# Patient Record
Sex: Female | Born: 1965 | Race: White | Hispanic: No | Marital: Married | Smoking: Former smoker
Health system: Southern US, Community
[De-identification: ages and names within clinical notes are randomized; demographics above are authoritative.]

## PROBLEM LIST (undated history)

## (undated) DIAGNOSIS — E785 Hyperlipidemia, unspecified: Secondary | ICD-10-CM

## (undated) DIAGNOSIS — G43909 Migraine, unspecified, not intractable, without status migrainosus: Secondary | ICD-10-CM

## (undated) DIAGNOSIS — I639 Cerebral infarction, unspecified: Secondary | ICD-10-CM

## (undated) HISTORY — DX: Hyperlipidemia, unspecified: E78.5

---

## 2019-10-02 ENCOUNTER — Emergency Department (HOSPITAL_COMMUNITY): Payer: Medicaid Other

## 2019-10-02 ENCOUNTER — Emergency Department (HOSPITAL_COMMUNITY): Payer: Medicaid Other | Admitting: Registered Nurse

## 2019-10-02 ENCOUNTER — Inpatient Hospital Stay (HOSPITAL_COMMUNITY)
Admission: EM | Admit: 2019-10-02 | Discharge: 2019-10-07 | DRG: 023 | Disposition: A | Discharge status: Home / Self Care | Payer: Medicaid Other | Attending: Neurology | Admitting: Neurology

## 2019-10-02 ENCOUNTER — Encounter (HOSPITAL_COMMUNITY)
Admission: EM | Disposition: A | Discharge status: Home / Self Care | Payer: Self-pay | Source: Home / Self Care | Attending: Neurology

## 2019-10-02 ENCOUNTER — Other Ambulatory Visit: Payer: Self-pay

## 2019-10-02 DIAGNOSIS — E785 Hyperlipidemia, unspecified: Secondary | ICD-10-CM | POA: Diagnosis present

## 2019-10-02 DIAGNOSIS — Z720 Tobacco use: Secondary | ICD-10-CM | POA: Diagnosis not present

## 2019-10-02 DIAGNOSIS — R1310 Dysphagia, unspecified: Secondary | ICD-10-CM | POA: Diagnosis present

## 2019-10-02 DIAGNOSIS — I63412 Cerebral infarction due to embolism of left middle cerebral artery: Secondary | ICD-10-CM | POA: Diagnosis not present

## 2019-10-02 DIAGNOSIS — Z9282 Status post administration of tPA (rtPA) in a different facility within the last 24 hours prior to admission to current facility: Secondary | ICD-10-CM

## 2019-10-02 DIAGNOSIS — D62 Acute posthemorrhagic anemia: Secondary | ICD-10-CM | POA: Diagnosis not present

## 2019-10-02 DIAGNOSIS — E162 Hypoglycemia, unspecified: Secondary | ICD-10-CM | POA: Diagnosis not present

## 2019-10-02 DIAGNOSIS — R4701 Aphasia: Secondary | ICD-10-CM | POA: Diagnosis present

## 2019-10-02 DIAGNOSIS — G43109 Migraine with aura, not intractable, without status migrainosus: Secondary | ICD-10-CM | POA: Diagnosis present

## 2019-10-02 DIAGNOSIS — R29716 NIHSS score 16: Secondary | ICD-10-CM | POA: Diagnosis present

## 2019-10-02 DIAGNOSIS — E876 Hypokalemia: Secondary | ICD-10-CM | POA: Diagnosis present

## 2019-10-02 DIAGNOSIS — I700 Atherosclerosis of aorta: Secondary | ICD-10-CM | POA: Diagnosis present

## 2019-10-02 DIAGNOSIS — Z20822 Contact with and (suspected) exposure to covid-19: Secondary | ICD-10-CM | POA: Diagnosis present

## 2019-10-02 DIAGNOSIS — I63512 Cerebral infarction due to unspecified occlusion or stenosis of left middle cerebral artery: Secondary | ICD-10-CM | POA: Diagnosis present

## 2019-10-02 DIAGNOSIS — J90 Pleural effusion, not elsewhere classified: Secondary | ICD-10-CM | POA: Diagnosis not present

## 2019-10-02 DIAGNOSIS — G8101 Flaccid hemiplegia affecting right dominant side: Secondary | ICD-10-CM | POA: Diagnosis present

## 2019-10-02 DIAGNOSIS — I161 Hypertensive emergency: Secondary | ICD-10-CM | POA: Diagnosis present

## 2019-10-02 DIAGNOSIS — J9601 Acute respiratory failure with hypoxia: Secondary | ICD-10-CM | POA: Diagnosis not present

## 2019-10-02 DIAGNOSIS — R451 Restlessness and agitation: Secondary | ICD-10-CM | POA: Diagnosis not present

## 2019-10-02 DIAGNOSIS — Z823 Family history of stroke: Secondary | ICD-10-CM | POA: Diagnosis not present

## 2019-10-02 DIAGNOSIS — I10 Essential (primary) hypertension: Secondary | ICD-10-CM | POA: Diagnosis present

## 2019-10-02 DIAGNOSIS — R29810 Facial weakness: Secondary | ICD-10-CM | POA: Diagnosis present

## 2019-10-02 DIAGNOSIS — I959 Hypotension, unspecified: Secondary | ICD-10-CM | POA: Diagnosis not present

## 2019-10-02 DIAGNOSIS — Z978 Presence of other specified devices: Secondary | ICD-10-CM

## 2019-10-02 DIAGNOSIS — R471 Dysarthria and anarthria: Secondary | ICD-10-CM | POA: Diagnosis present

## 2019-10-02 DIAGNOSIS — I639 Cerebral infarction, unspecified: Secondary | ICD-10-CM | POA: Diagnosis present

## 2019-10-02 DIAGNOSIS — D72829 Elevated white blood cell count, unspecified: Secondary | ICD-10-CM | POA: Diagnosis not present

## 2019-10-02 DIAGNOSIS — I69391 Dysphagia following cerebral infarction: Secondary | ICD-10-CM

## 2019-10-02 DIAGNOSIS — Z0189 Encounter for other specified special examinations: Secondary | ICD-10-CM

## 2019-10-02 HISTORY — DX: Cerebral infarction, unspecified: I63.9

## 2019-10-02 HISTORY — PX: RADIOLOGY WITH ANESTHESIA: SHX6223

## 2019-10-02 HISTORY — DX: Migraine, unspecified, not intractable, without status migrainosus: G43.909

## 2019-10-02 LAB — CBC
HCT: 45.3 % (ref 36.0–46.0)
Hemoglobin: 14.7 g/dL (ref 12.0–15.0)
MCH: 30.6 pg (ref 26.0–34.0)
MCHC: 32.5 g/dL (ref 30.0–36.0)
MCV: 94.2 fL (ref 80.0–100.0)
Platelets: 366 10*3/uL (ref 150–400)
RBC: 4.81 MIL/uL (ref 3.87–5.11)
RDW: 12.7 % (ref 11.5–15.5)
WBC: 14.1 10*3/uL — ABNORMAL HIGH (ref 4.0–10.5)
nRBC: 0 % (ref 0.0–0.2)

## 2019-10-02 LAB — COMPREHENSIVE METABOLIC PANEL
ALT: 19 U/L (ref 0–44)
AST: 17 U/L (ref 15–41)
Albumin: 4.5 g/dL (ref 3.5–5.0)
Alkaline Phosphatase: 74 U/L (ref 38–126)
Anion gap: 8 (ref 5–15)
BUN: 13 mg/dL (ref 6–20)
CO2: 25 mmol/L (ref 22–32)
Calcium: 9.2 mg/dL (ref 8.9–10.3)
Chloride: 107 mmol/L (ref 98–111)
Creatinine, Ser: 0.88 mg/dL (ref 0.44–1.00)
GFR calc Af Amer: >60 mL/min (ref >60)
GFR calc non Af Amer: >60 mL/min (ref >60)
Glucose, Bld: 113 mg/dL — ABNORMAL HIGH (ref 70–99)
Potassium: 4.3 mmol/L (ref 3.5–5.1)
Sodium: 140 mmol/L (ref 135–145)
Total Bilirubin: 0.4 mg/dL (ref 0.3–1.2)
Total Protein: 7.4 g/dL (ref 6.5–8.1)

## 2019-10-02 LAB — DIFFERENTIAL
Abs Immature Granulocytes: 0.05 10*3/uL (ref 0.00–0.07)
Basophils Absolute: 0.1 10*3/uL (ref 0.0–0.1)
Basophils Relative: 1 %
Eosinophils Absolute: 0.3 10*3/uL (ref 0.0–0.5)
Eosinophils Relative: 2 %
Immature Granulocytes: 0 %
Lymphocytes Relative: 20 %
Lymphs Abs: 2.8 10*3/uL (ref 0.7–4.0)
Monocytes Absolute: 0.6 10*3/uL (ref 0.1–1.0)
Monocytes Relative: 4 %
Neutro Abs: 10.2 10*3/uL — ABNORMAL HIGH (ref 1.7–7.7)
Neutrophils Relative %: 73 %

## 2019-10-02 LAB — PROTIME-INR
INR: 0.9 (ref 0.8–1.2)
Prothrombin Time: 12.1 seconds (ref 11.4–15.2)

## 2019-10-02 LAB — RESPIRATORY PANEL BY RT PCR (FLU A&B, COVID)
Influenza A by PCR: NEGATIVE
Influenza B by PCR: NEGATIVE
SARS Coronavirus 2 by RT PCR: NEGATIVE

## 2019-10-02 LAB — APTT: aPTT: 27 seconds (ref 24–36)

## 2019-10-02 LAB — CBG MONITORING, ED: Glucose-Capillary: 124 mg/dL — ABNORMAL HIGH (ref 70–99)

## 2019-10-02 LAB — ETHANOL: Alcohol, Ethyl (B): <10 mg/dL (ref <10)

## 2019-10-02 IMAGING — XA IR PERCUTANEOUS ART THORMBECTOMY/INFUSION INTRACRANIAL INCLUDE D
1 series · 8 of 24 positions shown · IV contrast (IODINE)
Comparison: CT angiogram of the head and neck [DATE].

INDICATION: New onset aphasia, right-sided paralysis, and left gaze deviation.
Occluded left middle cerebral artery M1 segment on CT angiogram of
the head and neck.

EXAM:
1. EMERGENT LARGE VESSEL OCCLUSION THROMBOLYSIS (anterior
CIRCULATION)
2. Intracranial rescue stent placement
TECHNIQUE: Following a full explanation of the procedure along with the
potential associated complications, an informed witnessed consent
was obtained patient's spouse. The risks of intracranial hemorrhage
of 10%, worsening neurological deficit, ventilator dependency, death
and inability to revascularize were all reviewed in detail with the
patient's spouse.

[Series 300: dr. (person_name) · 8 of 150 slices shown]
[im 7/150]
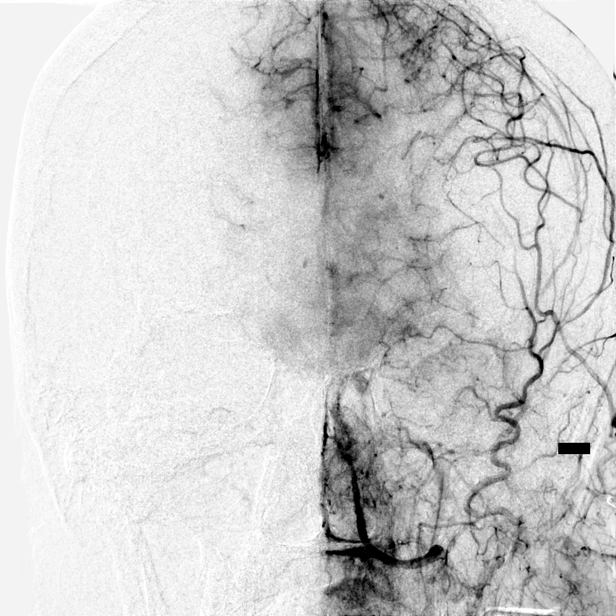
[im 26/150]
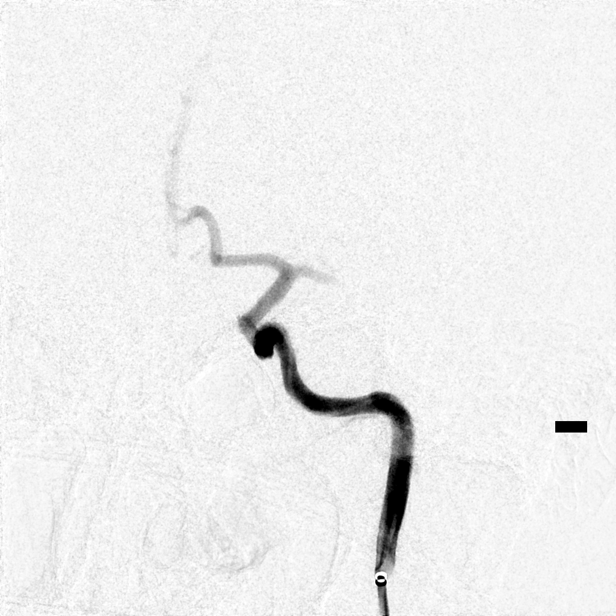
[im 46/150]
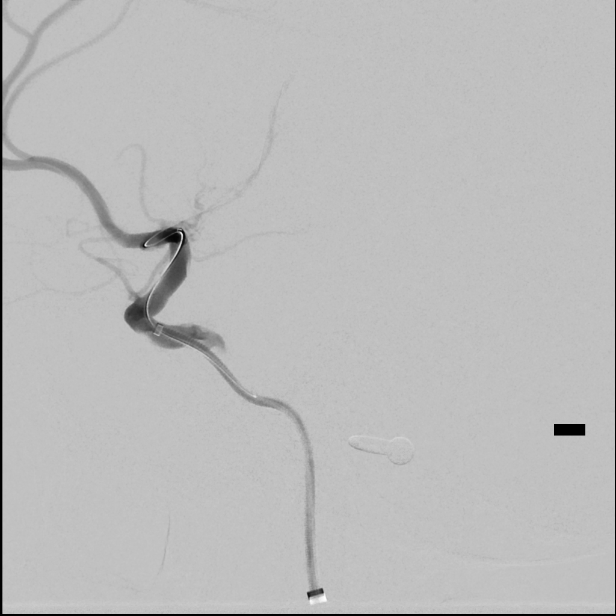
[im 65/150]
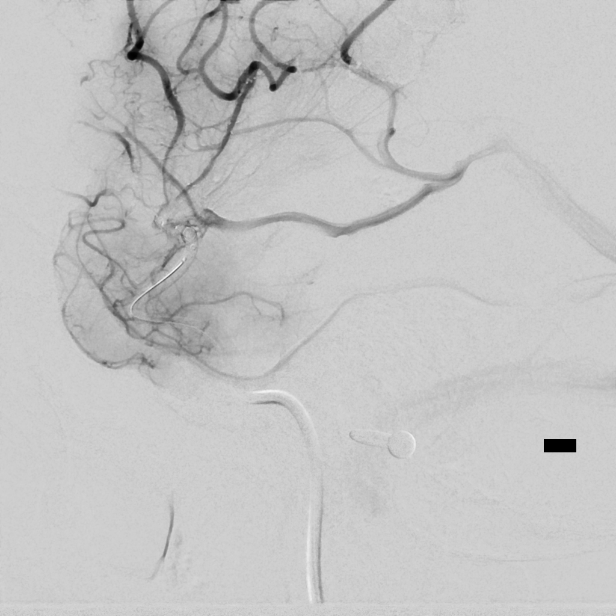
[im 85/150]
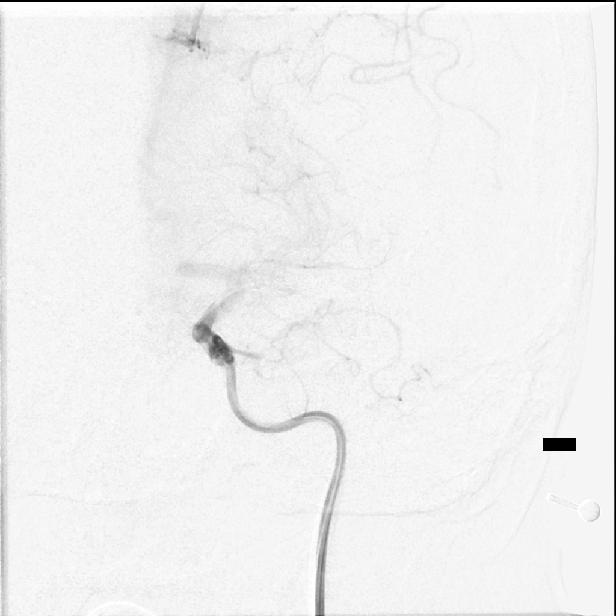
[im 104/150]
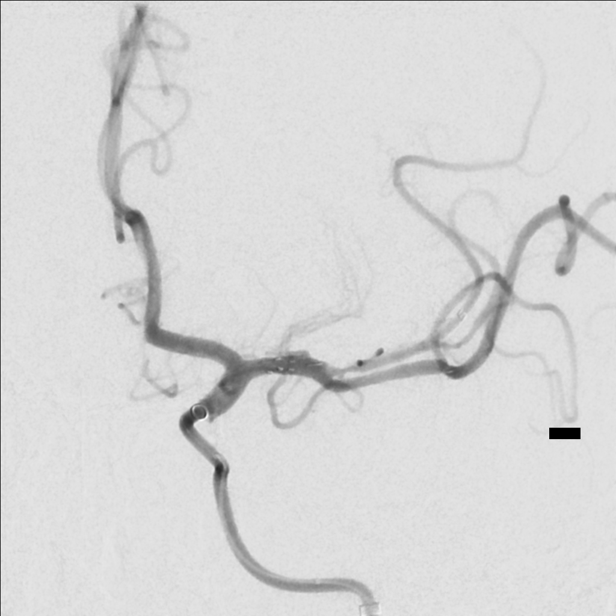
[im 124/150]
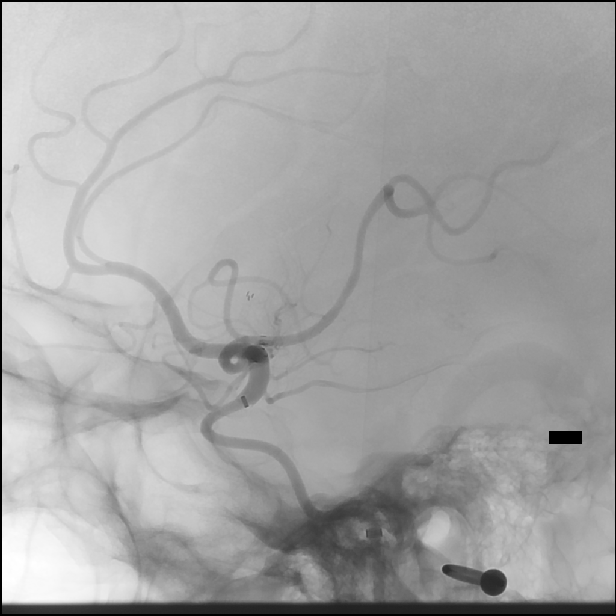
[im 143/150]
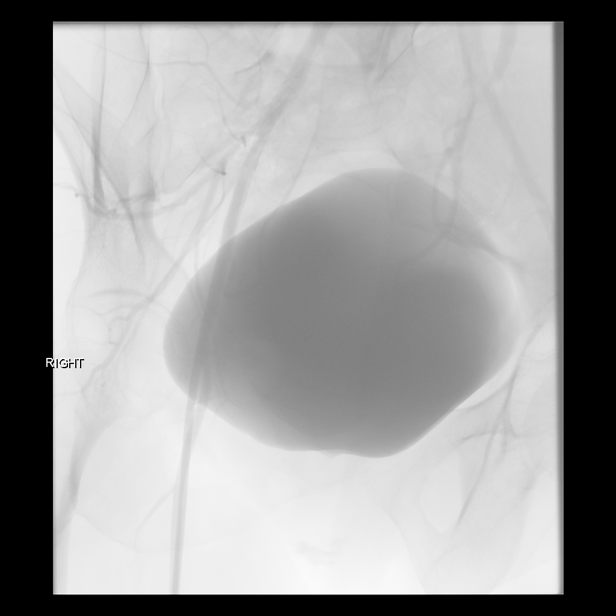

[8 of 24 positions shown; findings below may reference images not displayed]

MEDICATIONS:
Ancef 2 g IV antibiotic was administered within 1 hour of the
procedure.

ANESTHESIA/SEDATION:
General anesthesia.

CONTRAST:  Isovue 300 approximately 120 mL.

FLUOROSCOPY TIME:  Fluoroscopy Time: 76 minutes 36 seconds ([LE]
mGy).

COMPLICATIONS:
None immediate.
The patient was then put under general anesthesia by the [REDACTED] at [HOSPITAL].

The right groin was prepped and draped in the usual sterile fashion.
Thereafter using modified Seldinger technique, transfemoral access
into the right common femoral artery was obtained without
difficulty. Over a 0.035 inch guidewire an 8 French 25 cm Pinnacle
sheath was inserted. Through this, and also over a 0.035 inch
guidewire a connection of ANNIE GRACE 125 cm catheter inside of an
087 95 cm balloon guide catheter was advanced to the left common
carotid artery. The guidewire, and the ANNIE GRACE support catheter
were then removed. Good aspiration obtained from the hub of the
balloon guide catheter. A gentle control arteriogram performed
through balloon guide catheter demonstrated no evidence of spasms,
dissections or of intraluminal filling defects.
FINDINGS: However, the left common carotid arteriogram demonstrates the left
external carotid artery and its major branches to be widely patent.
The left internal carotid artery at the bulb to the cranial skull
base also demonstrates wide patency. The petrous, the cavernous and
the supraclinoid segments are intact. The left anterior cerebral
artery opacifies into the capillary and venous phases with prompt
cross-filling of the right anterior cerebral A2 segment from the
left internal carotid artery injection.

The left middle cerebral artery demonstrates complete occlusion in
its mid M1 segment.

The delayed arterial phase demonstrates partial retrograde
opacification of the anterior to middle perisylvian branches.

PROCEDURE:
Over a 0.014 inch standard Synchro micro guidewire with a J
configuration the combination of a Trevo ProVue 021 microcatheter
inside of a 6 French 132 cm Catalyst guide catheter was advanced
using biplane roadmap technique and constant fluoroscopic guidance
to the supraclinoid left ICA.

The left middle cerebral artery was then entered with the micro
guidewire and into the superior division of the left middle cerebral
artery followed by the microcatheter. The guidewire was removed.
Good aspiration obtained from the hub of the microcatheter. A gentle
control arteriogram performed through this demonstrated safe
position of the tip of the microcatheter which was then connected to
continuous heparinized saline infusion. The 6 French Catalyst guide
catheter was then advanced into the distal left middle cerebral
artery with near complete occlusion of the aspiration with a
Penumbra aspiration device for approximately 2-1/2 minutes. A 4 mm x
40 mm Solitaire X retrieval device was then advanced to the distal
end of the microcatheter and deployed with slight forward gentle
traction with the right hand on the delivery micro guidewire, and
retrieving the delivery microcatheter. Proximal flow arrest was then
established by inflating the balloon of the balloon guide catheter
in the mid left internal carotid artery.

With constant aspiration applied at the hub of the 6 French Catalyst
guide catheter, and with a 60 mL syringe at the hub of the balloon
guide catheter, the combination of the retrieval device, and the
microcatheter and the 6 French Catalyst catheter were retrieved and
removed. Flow arrest was then reversed.

A control arteriogram performed through the balloon guide catheter
in the left internal carotid artery continued to demonstrate
occluded left middle cerebral M1 segment. No evidence of clot was
seen in the aspirate or entangled in the retrieval device.

A second pass was then made again using the above combination. At
this time access with the micro guidewire and the microcatheter was
obtained in the inferior division of the left middle cerebral artery
M2 M3 region where the microcatheter was positioned. The guidewire
was removed. Good aspiration obtained from the hub of the
microcatheter. A gentle control arteriogram performed through the
microcatheter demonstrates safe position of the tip of the
microcatheter. A 6 mm x 40 mm Solitaire X retrieval device was then
advanced to the distal end of the microcatheter. The O ring on the
delivery microcatheter was loosened. With slight forward gentle
traction with the right hand on the delivery micro guidewire, with
the left hand the delivery microcatheter was retrieved deploying the
retrieval device. The 6 French Catalyst guide was now engaged again
in the occluded left middle cerebral artery distally to near
complete occlusion to aspiration with the Penumbra aspiration device
over 2-1/2 minutes. With proximal flow arrest and constant
aspiration being applied at the hub of the balloon guide catheter in
the left internal carotid artery, the combination was retrieved and
removed. Again no evidence of a clot was seen in the aspirate or in
the retrieval device. A control arteriogram performed through the
balloon guide catheter in the left internal carotid artery again
demonstrated no change in the occluded left middle cerebral artery
M1 segment.

A third pass was then made using a large-bore catheter inside of
which was the 021 Trevo microcatheter again advanced as the
combination over a 0.14 inch standard Synchro micro guidewire to the
supraclinoid left ICA. Again the micro guidewire was then gently
manipulated with a torque device and this time advanced into the
middle branch of the left MCA trifurcation between the inferior and
the superior divisions. This is then followed by the microcatheter
into the M2 M3 region. The guidewire was removed. Good aspiration
obtained from the hub of the microcatheter. A gentle control
arteriogram performed through the microcatheter demonstrated safe
position of the tip of the microcatheter which was then connected to
continuous heparinized saline infusion. A 4 mm x 40 mm Solitaire X
retrieval device was again advanced to the distal end of the
microcatheter. This was again deployed in the usual manner. A gentle
control arteriogram performed through the 6 French large-bore
catheter in the proximal left MCA now demonstrated a TICI 2c
revascularization with an overlying severe atherosclerotic stenosis
at the origin of the middle branch and extending into the origin of
the inferior division.

Again with proximal flow arrest in the left internal carotid artery,
and constant aspiration with the Penumbra aspirate device with the
large-bore catheter embedded in the occluded distal left MCA over
2-1/2 minutes the combination was retrieved and removed. Again no
evidence of clot was found in the retrieval device, or aspirate.

A control arteriogram performed following flow reversal in the left
internal carotid artery demonstrated angiographically occluded left
middle cerebral artery.

It was felt overlying pathology was related to an underlying
atherosclerotic plaque causing significant stenosis.

Following discussion with the referring neuro hospitalist, it was
decided to proceed with rescue stenting.

At this time, the patient was loaded with Brilinta 180 mg, and
aspirin 81 mg via an orogastric tube.

A combination of a Headway 17 2 tip microcatheter inside of a 014
inch standard Synchro micro guidewire inside of a large-bore support
catheter was then advanced as mentioned earlier to the supraclinoid
left ICA.

The micro guidewire was then gently manipulated with a torque device
and advanced without difficulty into the inferior division of the
left MCA trifurcation followed by the microcatheter. The guidewire
was removed. Good aspiration obtained from the hub of the Headway
microcatheter. A gentle control arteriogram performed through
microcatheter demonstrated safe position of the tip of the
microcatheter.

A 4 mm x 24 mm Atlas stent delivery system was then advanced to the
distal end of the microcatheter, the delivery microcatheter was then
positioned such that the proximal portion of the landing zone stent
would be just inside the left middle cerebral artery origin.

The O ring on the delivery microcatheter was loosened. With slight
forward gentle traction with the right hand on the delivery micro
guidewire with the left hand the delivery microcatheter was
retrieved unsheathing the distal and the proximal portion of the
stent in excellent position. The tines of the stent opened
completely proximally and distally.

A control arteriogram performed through the large bore catheter
demonstrated excellent revascularization of the inferior division
into M3 M4 branches. The middle branch of the trifurcation remained
occluded.

The micro guidewire was reintroduced into the microcatheter and
advanced without difficulty into the proximal portion of the
deployed stent. Using a torque device, access was obtained into the
middle branch without difficulty followed by the microcatheter was
advanced to the M2 M3 region. The guidewire was removed. Good
aspiration obtained from the hub of the microcatheter which was then
connected to continuous heparinized saline infusion.

Again another 4 mm x 24 mm Atlas Neuroform stent was advanced to the
distal end of the microcatheter. This was again deployed such that
the proximal portion of the stent was in the proximal portion of the
left MCA. Following deployment, a control arteriogram performed
through the large bore catheter in the left middle cerebral artery
demonstrated a TICI 2c revascularization.

A control arteriogram performed approximately 5 minutes later
demonstrated progressive slow flow in the middle branch of the
stented segment.

This prompted the use of approximately 6 mg of intra-arterial
Integrilin for 10 minutes.

A control arteriogram performed approximately 10 minutes later
demonstrated slow revascularization of this stented branch.

It was, therefore, decided to stop at this juncture.

The large-bore catheter and the balloon guide catheter were
retrieved and removed. A CT of the brain demonstrated no evidence of
a gross hemorrhage, mass effect or midline shift. There was dense
contrast staining in the basal ganglia region.

Following this it was decided to proceed with a slow IV infusion of
aggrastat and half the minimal dose run as an infusion over
approximately 8-12 hours in order to protect the stented segments of
the left middle cerebral artery.

Patient was left intubated on account of the procedure, and also
patient had received IV tPA and multiple dual antiplatelets.
Additionally, the 8 French Pinnacle sheath in the right groin was
left and connected to continuous heparinized saline infusion.

Prior to the placement of rescue stents at the superior and inferior
divisions of the left middle cerebral artery, a CT was performed of
the brain on the table. This continued to demonstrate no evidence of
a gross hemorrhage, or mass effect or midline shift. Contrast stain
was seen in the basal ganglia region.

Distal pulses remained palpable in the dorsalis pedis, and the
posterior tibial regions bilaterally.

The patient was then transferred intubated to the neuro ICU to
continue with post revascularization care.
IMPRESSION: Status post endovascular revascularization of occluded left middle
cerebral artery M1 segment, with 2 passes with the Solitaire 4 mm x
40 mm X retrieval device, 1 pass with the 6 mm x 40 mm Solitaire X
retrieval device, with Penumbra aspiration, and placement of a
rescue stents in the superior and inferior divisions of the left
middle cerebral artery achieving a TICI 2b to TICI 2c
revascularization.

PLAN:
Follow-up in the clinic 4 weeks post discharge.

## 2019-10-02 IMAGING — CT CT HEAD CODE STROKE
3 series · 16 of 47 positions shown, 19 images · non-contrast
Comparison: None.

CLINICAL DATA: Code stroke. 53-year-old female with severe headache
and right side weakness since [JV] hours.

EXAM:
CT HEAD WITHOUT CONTRAST
TECHNIQUE: Contiguous axial images were obtained from the base of the skull
through the vertex without intravenous contrast.

[Series 2: head w o · axial · 0.43mm/px · z∈[+1276,+1401]mm · 10 of 30 slices shown, 13 images]
[im 3/30  brain]
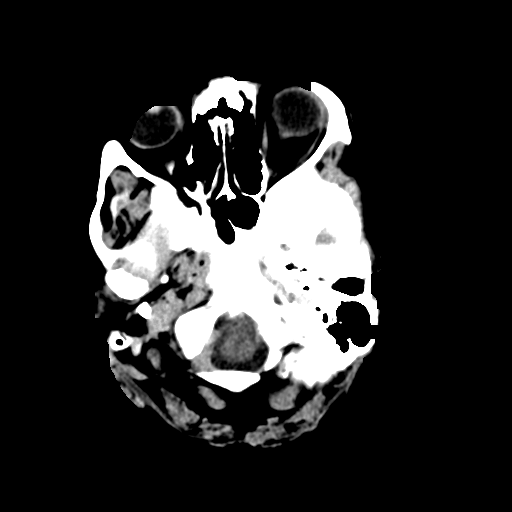
[im 3/30  bone]
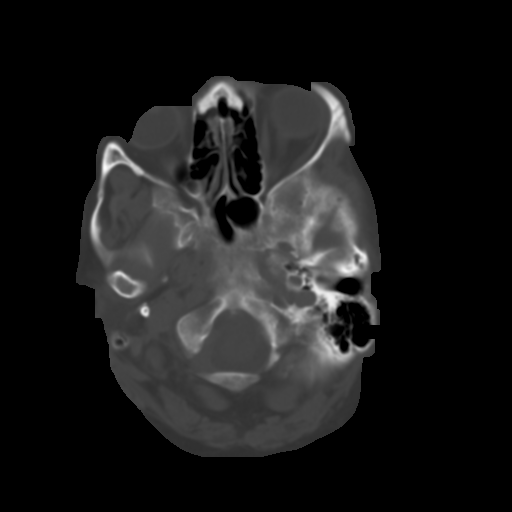
[im 6/30  brain]
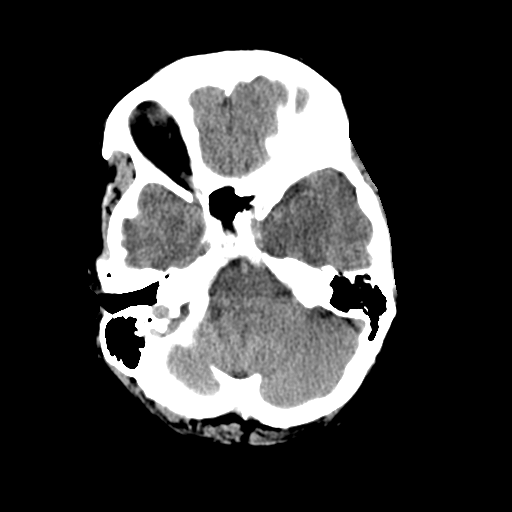
[im 9/30  brain]
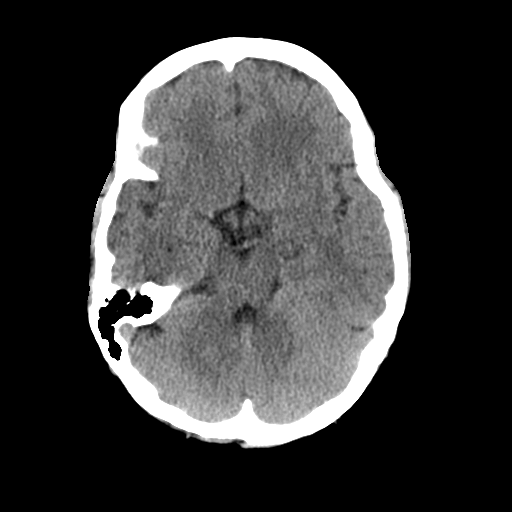
[im 11/30  brain]
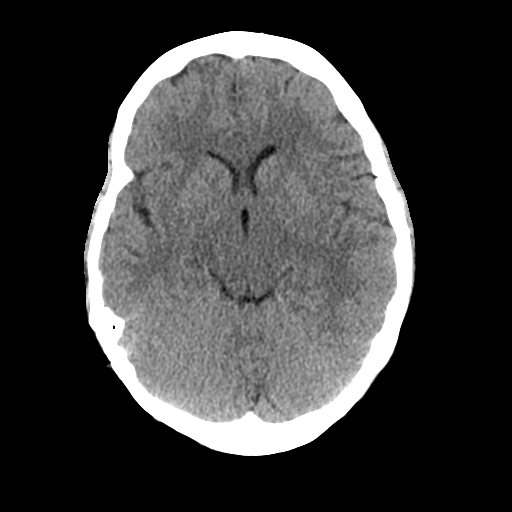
[im 14/30  brain]
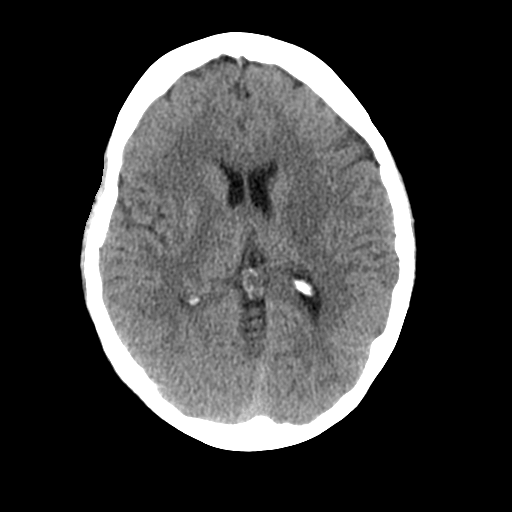
[im 14/30  bone]
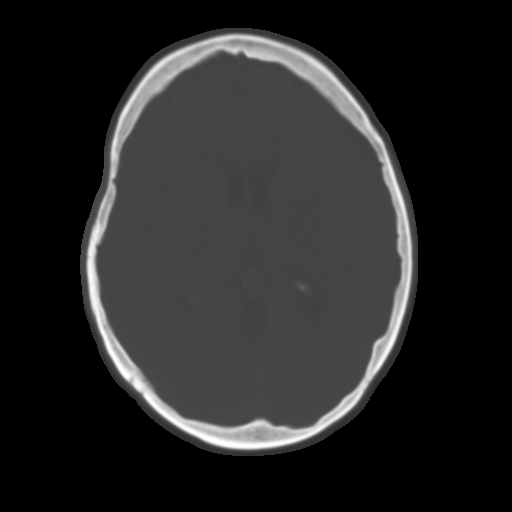
[im 17/30  brain]
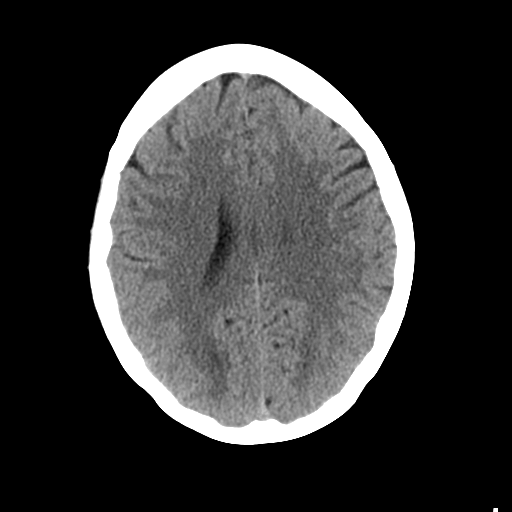
[im 20/30  brain]
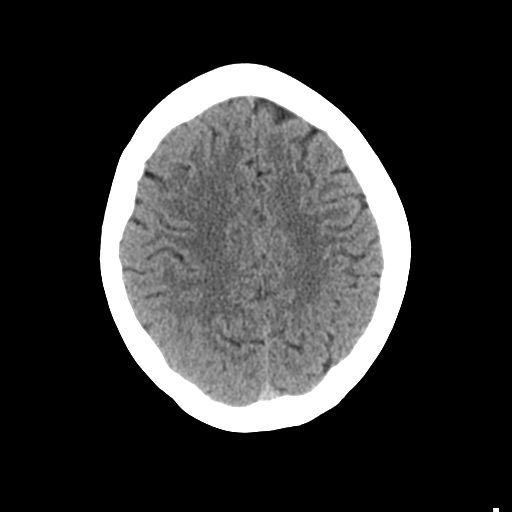
[im 23/30  brain]
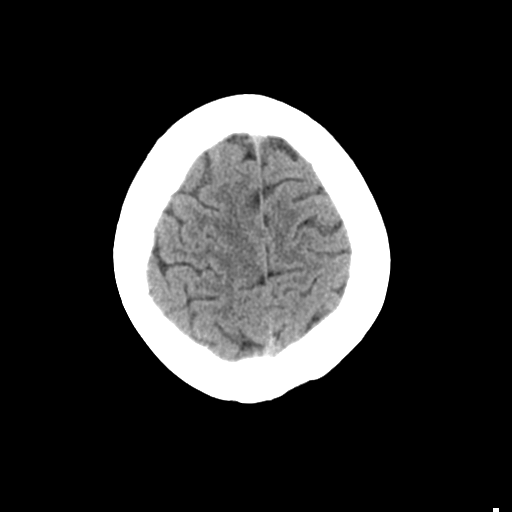
[im 25/30  brain]
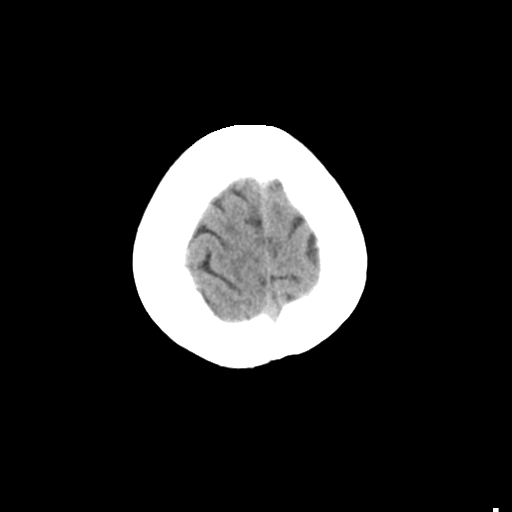
[im 25/30  bone]
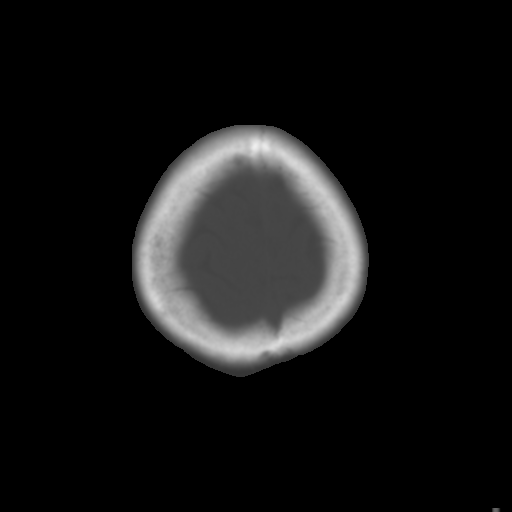
[im 28/30  brain]
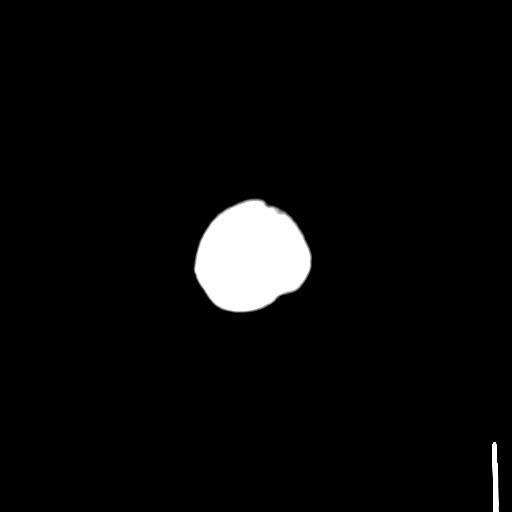

[Series 4: coronal soft · coronal · 0.31mm/px · 3 of 62 slices shown]
[im 21/62  brain]
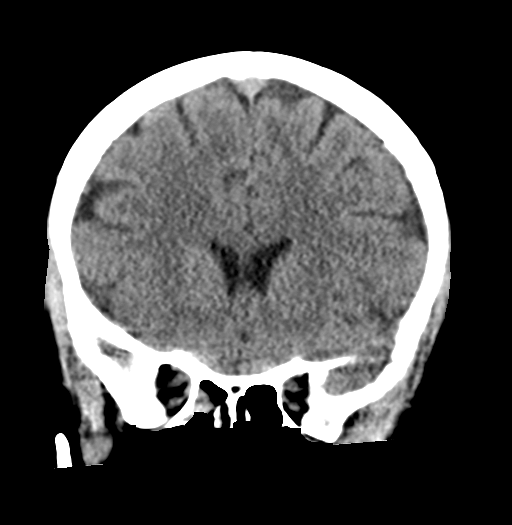
[im 28/62  brain]
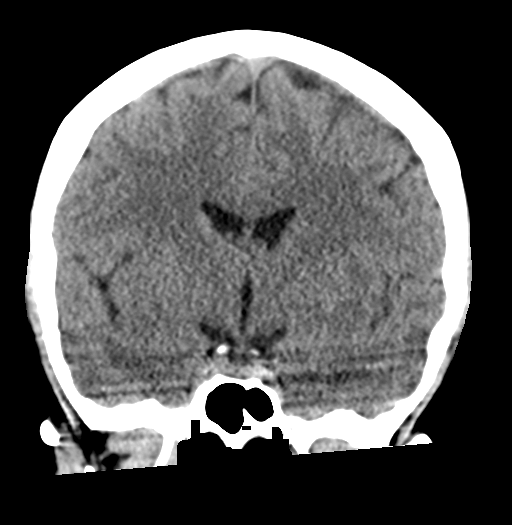
[im 34/62  brain]
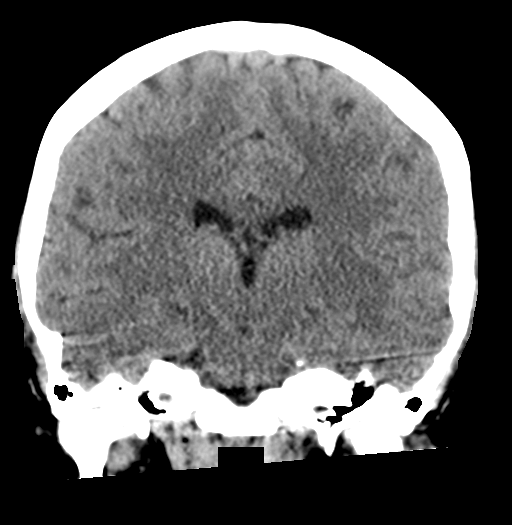

[Series 5: sagittal soft · sagittal · 0.32mm/px · 3 of 53 slices shown]
[im 18/53  brain]
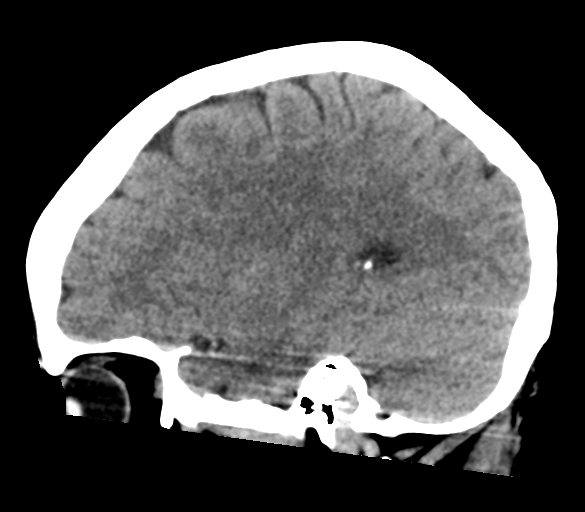
[im 27/53  brain]
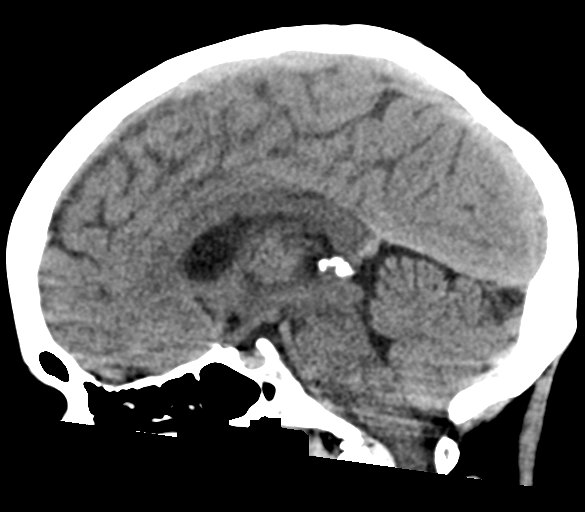
[im 35/53  brain]
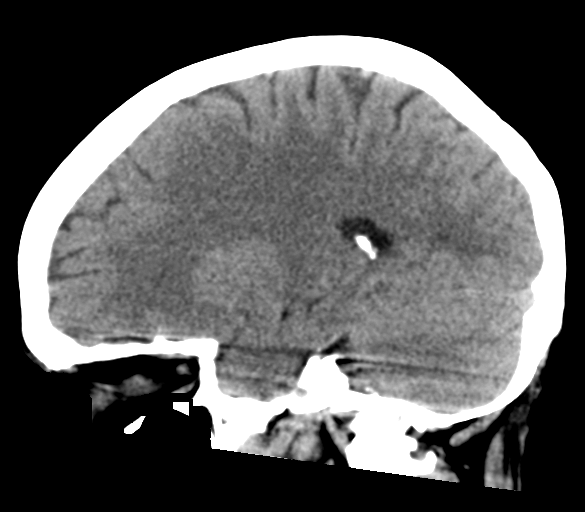

[16 of 47 positions shown; findings below may reference images not displayed]

FINDINGS: Brain: No midline shift, mass effect, or evidence of intracranial
mass lesion. No ventriculomegaly. No acute intracranial hemorrhage
identified.

No cortically based acute infarct identified. Chronic appearing
white matter hypodensity at the anterior left corona radiata on
series 2, image 16. Elsewhere gray-white matter differentiation is
within normal limits.

Vascular: No suspicious intracranial vascular hyperdensity.

Skull: Negative.

Sinuses/Orbits: Clear aside from some right posterior ethmoid air
cell mucosal thickening. Mastoids and tympanic cavities appear
clear.

Other: Visualized orbits and scalp soft tissues are within normal
limits.

ASPECTS ([place] Stroke Program Early CT Score)

Total score (0-10 with 10 being normal): 10
IMPRESSION: 1. No acute cortically based infarct or acute intracranial
hemorrhage identified. ASPECTS 10.
2. Evidence of small vessel disease in the left corona radiata, age
indeterminate although favor chronic.

Study discussed by telephone with Dr. KADRIC on [DATE] at

## 2019-10-02 SURGERY — IR WITH ANESTHESIA
Anesthesia: General

## 2019-10-02 MED ORDER — LIDOCAINE 2% (20 MG/ML) 5 ML SYRINGE
INTRAMUSCULAR | Status: DC | PRN
  Administered 2019-10-02: 60 mg via INTRAVENOUS

## 2019-10-02 MED ORDER — PHENYLEPHRINE HCL-NACL 10-0.9 MG/250ML-% IV SOLN
INTRAVENOUS | Status: DC | PRN
  Administered 2019-10-02: 15 ug/min via INTRAVENOUS

## 2019-10-02 MED ORDER — PHENYLEPHRINE 40 MCG/ML (10ML) SYRINGE FOR IV PUSH (FOR BLOOD PRESSURE SUPPORT)
PREFILLED_SYRINGE | INTRAVENOUS | Status: DC | PRN
  Administered 2019-10-02 (×6): 40 ug via INTRAVENOUS
  Administered 2019-10-03 (×2): 80 ug via INTRAVENOUS

## 2019-10-02 MED ORDER — PANTOPRAZOLE SODIUM 40 MG IV SOLR: 40.0000 mg | Freq: Every day | INTRAVENOUS | Status: DC

## 2019-10-02 MED ORDER — ACETAMINOPHEN 160 MG/5ML PO SOLN: 650.0000 mg | ORAL | Status: DC | PRN

## 2019-10-02 MED ORDER — LACTATED RINGERS IV SOLN: INTRAVENOUS | Status: DC | PRN

## 2019-10-02 MED ORDER — LABETALOL HCL 5 MG/ML IV SOLN
INTRAVENOUS | Status: AC
  Filled 2019-10-02: qty 4

## 2019-10-02 MED ORDER — ALTEPLASE 100 MG IV SOLR
INTRAVENOUS | Status: AC
  Filled 2019-10-02: qty 100

## 2019-10-02 MED ORDER — DEXAMETHASONE SODIUM PHOSPHATE 10 MG/ML IJ SOLN
INTRAMUSCULAR | Status: DC | PRN
  Administered 2019-10-02: 5 mg via INTRAVENOUS

## 2019-10-02 MED ORDER — SENNOSIDES-DOCUSATE SODIUM 8.6-50 MG PO TABS: 1.0000 | ORAL_TABLET | Freq: Every evening | ORAL | Status: DC | PRN

## 2019-10-02 MED ORDER — STROKE: EARLY STAGES OF RECOVERY BOOK
Freq: Once | Status: DC
  Filled 2019-10-02: qty 1

## 2019-10-02 MED ORDER — CEFAZOLIN SODIUM-DEXTROSE 2-4 GM/100ML-% IV SOLN
INTRAVENOUS | Status: AC
  Filled 2019-10-02: qty 100

## 2019-10-02 MED ORDER — VERAPAMIL HCL 2.5 MG/ML IV SOLN
INTRAVENOUS | Status: AC
  Filled 2019-10-02: qty 2

## 2019-10-02 MED ORDER — PROPOFOL 10 MG/ML IV BOLUS
INTRAVENOUS | Status: DC | PRN
  Administered 2019-10-02: 110 mg via INTRAVENOUS

## 2019-10-02 MED ORDER — IOHEXOL 240 MG/ML SOLN
INTRAMUSCULAR | Status: AC
  Filled 2019-10-02: qty 200

## 2019-10-02 MED ORDER — CLEVIDIPINE BUTYRATE 0.5 MG/ML IV EMUL
0.0000 mg/h | INTRAVENOUS | Status: DC
  Filled 2019-10-02 (×2): qty 50

## 2019-10-02 MED ORDER — ACETAMINOPHEN 650 MG RE SUPP: 650.0000 mg | RECTAL | Status: DC | PRN

## 2019-10-02 MED ORDER — CEFAZOLIN SODIUM-DEXTROSE 2-3 GM-%(50ML) IV SOLR
INTRAVENOUS | Status: DC | PRN
  Administered 2019-10-02: 2 g via INTRAVENOUS

## 2019-10-02 MED ORDER — CLOPIDOGREL BISULFATE 300 MG PO TABS
ORAL_TABLET | ORAL | Status: AC
  Filled 2019-10-02: qty 1

## 2019-10-02 MED ORDER — SUCCINYLCHOLINE CHLORIDE 200 MG/10ML IV SOSY
PREFILLED_SYRINGE | INTRAVENOUS | Status: DC | PRN
  Administered 2019-10-02: 110 mg via INTRAVENOUS

## 2019-10-02 MED ORDER — SODIUM CHLORIDE 0.9 % IV SOLN
50.0000 mL | Freq: Once | INTRAVENOUS | Status: AC
  Administered 2019-10-02: 50 mL via INTRAVENOUS

## 2019-10-02 MED ORDER — ASPIRIN 81 MG PO CHEW
CHEWABLE_TABLET | ORAL | Status: AC
  Administered 2019-10-02: 81 mg via OROGASTRIC
  Filled 2019-10-02: qty 1

## 2019-10-02 MED ORDER — SODIUM CHLORIDE 0.9 % IV SOLN: INTRAVENOUS | Status: DC

## 2019-10-02 MED ORDER — NITROGLYCERIN 1 MG/10 ML FOR IR/CATH LAB
INTRA_ARTERIAL | Status: AC
  Administered 2019-10-02 (×2): 25 ug via INTRA_ARTERIAL
  Filled 2019-10-02: qty 10

## 2019-10-02 MED ORDER — EPTIFIBATIDE 20 MG/10ML IV SOLN
INTRAVENOUS | Status: AC
  Administered 2019-10-03: 6 mg via INTRA_ARTERIAL
  Filled 2019-10-02: qty 10

## 2019-10-02 MED ORDER — ROCURONIUM BROMIDE 10 MG/ML (PF) SYRINGE
PREFILLED_SYRINGE | INTRAVENOUS | Status: DC | PRN
  Administered 2019-10-02: 30 mg via INTRAVENOUS
  Administered 2019-10-02: 10 mg via INTRAVENOUS
  Administered 2019-10-02 – 2019-10-03 (×2): 50 mg via INTRAVENOUS

## 2019-10-02 MED ORDER — TICAGRELOR 90 MG PO TABS
ORAL_TABLET | ORAL | Status: AC
  Administered 2019-10-02: 180 mg via OROGASTRIC
  Filled 2019-10-02: qty 2

## 2019-10-02 MED ORDER — ALTEPLASE (STROKE) FULL DOSE INFUSION
0.9000 mg/kg | Freq: Once | INTRAVENOUS | Status: AC
  Administered 2019-10-02: 54 mg via INTRAVENOUS
  Filled 2019-10-02: qty 100

## 2019-10-02 MED ORDER — ACETAMINOPHEN 325 MG PO TABS: 650.0000 mg | ORAL_TABLET | ORAL | Status: DC | PRN

## 2019-10-02 MED ORDER — IOHEXOL 350 MG/ML SOLN
150.0000 mL | Freq: Once | INTRAVENOUS | Status: AC | PRN
  Administered 2019-10-02: 115 mL via INTRAVENOUS

## 2019-10-02 MED ORDER — LABETALOL HCL 5 MG/ML IV SOLN
20.0000 mg | Freq: Once | INTRAVENOUS | Status: DC
  Filled 2019-10-02 (×2): qty 4

## 2019-10-02 MED ORDER — FENTANYL CITRATE (PF) 250 MCG/5ML IJ SOLN
INTRAMUSCULAR | Status: DC | PRN
  Administered 2019-10-02: 50 ug via INTRAVENOUS
  Administered 2019-10-02: 150 ug via INTRAVENOUS
  Administered 2019-10-02: 50 ug via INTRAVENOUS
  Administered 2019-10-03: 100 ug via INTRAVENOUS

## 2019-10-02 MED ORDER — TIROFIBAN HCL IN NACL 5-0.9 MG/100ML-% IV SOLN
INTRAVENOUS | Status: AC
  Filled 2019-10-02: qty 100

## 2019-10-02 NOTE — ED Notes (Signed)
Unable to perform SSS.  

## 2019-10-02 NOTE — Code Documentation (Signed)
Responded to IR team page for pt coming from [facility] ED. Pt arrived at 2147 and assisted to IR suite. Pt arrived in IR at 2150.

## 2019-10-02 NOTE — ED Provider Notes (Addendum)
[facility] EMERGENCY DEPARTMENT Provider Note   CSN: [id number] Arrival date & time: 10/02/19  1943  An emergency department physician performed an initial assessment on this suspected stroke patient at 7.  History Chief Complaint  Patient presents with  . Code Stroke    Erin Peck is a 54 y.o. female.  Patient brought in by EMS.  Acute onset of right facial droop right arm weakness and right leg weakness at 5:50 PM.  Patient was fine earlier in the day.  Got some information from EMS about maybe having a history of some partial complex seizures.  Patient denies any headache.  Denies any fevers.  States that she can see fine.  Chart review shows a history of complicated migraine with slurred speech in 2019.        No past medical history on file.  There are no problems to display for this patient.     OB History   No obstetric history on file.     No family history on file.  Social History   Tobacco Use  . Smoking status: Not on file  Substance Use Topics  . Alcohol use: Not on file  . Drug use: Not on file    Home Medications Prior to Admission medications   Medication Sig Start Date End Date Taking? Authorizing Provider  ibuprofen (ADVIL) 200 MG tablet Take 200 mg by mouth every 6 (six) hours as needed for mild pain or moderate pain.   Yes [provider]    Allergies    Patient has no known allergies.  Review of Systems   Review of Systems  Constitutional: Negative for chills and fever.  HENT: Negative for congestion, rhinorrhea and sore throat.   Eyes: Negative for visual disturbance.  Respiratory: Negative for cough and shortness of breath.   Cardiovascular: Negative for chest pain and leg swelling.  Gastrointestinal: Negative for abdominal pain, diarrhea, nausea and vomiting.  Genitourinary: Negative for dysuria.  Musculoskeletal: Negative for back pain and neck pain.  Skin: Negative for rash.  Neurological: Positive for  weakness. Negative for dizziness, light-headedness and headaches.  Hematological: Does not bruise/bleed easily.  Psychiatric/Behavioral: Negative for confusion.    Physical Exam Updated Vital Signs BP (!) 137/105   Pulse 99   Temp 98.5 F (36.9 C)   Resp 18   Ht 1.651 m (5\' 5" )   Wt 60 kg   SpO2 95%   BMI 22.02 kg/m   Physical Exam Vitals and nursing note reviewed.  Constitutional:      General: She is not in acute distress.    Appearance: She is well-developed.  HENT:     Head: Normocephalic and atraumatic.  Eyes:     Pupils: Pupils are equal, round, and reactive to light.     Comments: Does not bring eyes to the right side.  Cardiovascular:     Rate and Rhythm: Normal rate and regular rhythm.     Heart sounds: No murmur.  Pulmonary:     Effort: Pulmonary effort is normal. No respiratory distress.     Breath sounds: Normal breath sounds.  Abdominal:     Palpations: Abdomen is soft.     Tenderness: There is no abdominal tenderness.  Musculoskeletal:     Cervical back: Neck supple.  Skin:    General: Skin is warm and dry.  Neurological:     Mental Status: She is alert.     Cranial Nerves: Cranial nerve deficit present.  Motor: Weakness present.     Comments: Patient was some movement of the right arm right leg.  But not able to pick them up off the gurney.  Obvious right facial droop.     ED Results / Procedures / Treatments   Labs (all labs ordered are listed, but only abnormal results are displayed) Labs Reviewed  CBC - Abnormal; Notable for the following components:      Result Value   WBC 14.1 (*)    All other components within normal limits  DIFFERENTIAL - Abnormal; Notable for the following components:   Neutro Abs 10.2 (*)    All other components within normal limits  COMPREHENSIVE METABOLIC PANEL - Abnormal; Notable for the following components:   Glucose, Bld 113 (*)    All other components within normal limits  CBG MONITORING, ED - Abnormal;  Notable for the following components:   Glucose-Capillary 124 (*)    All other components within normal limits  RESPIRATORY PANEL BY RT PCR (FLU A&B, COVID)  ETHANOL  PROTIME-INR  APTT  RAPID URINE DRUG SCREEN, HOSP PERFORMED  URINALYSIS, ROUTINE W REFLEX MICROSCOPIC  I-STAT CHEM 8, ED  POC URINE PREG, ED    EKG EKG Interpretation  Date/Time:  Monday October 02 2019 19:58:06 EST Ventricular Rate:  110 PR Interval:    QRS Duration: 88 QT Interval:  343 QTC Calculation: 464 R Axis:   75 Text Interpretation: Sinus tachycardia Baseline wander in lead(s) V3 V4 No previous ECGs available Confirmed by Vanetta Mulders [phone]) on 10/02/2019 8:06:49 PM   Radiology CT HEAD CODE STROKE WO CONTRAST  Result Date: 10/02/2019 CLINICAL DATA:  Code stroke. 54 year old female with severe headache and right side weakness since 1750 hours. EXAM: CT HEAD WITHOUT CONTRAST TECHNIQUE: Contiguous axial images were obtained from the base of the skull through the vertex without intravenous contrast. COMPARISON:  None. FINDINGS: Brain: No midline shift, mass effect, or evidence of intracranial mass lesion. No ventriculomegaly. No acute intracranial hemorrhage identified. No cortically based acute infarct identified. Chronic appearing white matter hypodensity at the anterior left corona radiata on series 2, image 16. Elsewhere gray-white matter differentiation is within normal limits. Vascular: No suspicious intracranial vascular hyperdensity. Skull: Negative. Sinuses/Orbits: Clear aside from some right posterior ethmoid air cell mucosal thickening. Mastoids and tympanic cavities appear clear. Other: Visualized orbits and scalp soft tissues are within normal limits. ASPECTS [facility] Stroke Program Early CT Score) Total score (0-10 with 10 being normal): 10 IMPRESSION: 1. No acute cortically based infarct or acute intracranial hemorrhage identified. ASPECTS 10. 2. 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 Study discussed by telephone with Dr. Vanetta Mulders on 10/02/2019 at 20:18 . Electronically Signed   By: Odessa Fleming M.D.   On: 10/02/2019 20:20    Procedures Procedures (including critical care time) CRITICAL CARE Performed by: Vanetta Mulders Total critical care time: 60 minutes Critical care time was exclusive of separately billable procedures and treating other patients. Critical care was necessary to treat or prevent imminent or life-threatening deterioration. Critical care was time spent personally by me on the following activities: development of treatment plan with patient and/or surrogate as well as nursing, discussions with consultants, evaluation of patient's response to treatment, examination of patient, obtaining history from patient or surrogate, ordering and performing treatments and interventions, ordering and review of laboratory studies, ordering and review of radiographic studies, pulse oximetry and re-evaluation of patient's condition.   Medications Ordered in  ED Medications  labetalol (NORMODYNE) 5 MG/ML injection (has no administration in time range)  alteplase (ACTIVASE) 1 mg/mL infusion 54 mg ( Intravenous Canceled Entry 10/02/19 2031)    Followed by  0.9 %  sodium chloride infusion (has no administration in time range)  iohexol (OMNIPAQUE) 350 MG/ML injection 150 mL (115 mLs Intravenous Contrast Given 10/02/19 2033)    ED Course  I have reviewed the triage vital signs and the nursing notes.  Pertinent labs & imaging results that were available during my care of the patient were reviewed by me and considered in my medical decision making (see chart for details).    MDM Rules/Calculators/A&P                     Patient with significant right-sided deficit and right facial droop.  Seems to have gaze deficit to the right.  Seems to be ignoring the right side.  Speech seems to be intact but some difficulty talking due to the  facial droop.  Code stroke was initiated.  Head CT without evidence of any significant head bleed.  Teleneurology is recommending TPA which will be started.  Following TPA patient will get CT angio head and neck.  Patient will need to be transferred to the neurology service at [facility].  Final Clinical Impression(s) / ED Diagnoses Final diagnoses:  Cerebrovascular accident (CVA), unspecified mechanism [facility])    Rx / DC Orders ED Discharge Orders    None       Fredia Sorrow, MD 10/02/19 2027    Fredia Sorrow, MD 10/02/19 2044   Patient's daughter states that there is no history of any seizures.  Not sure if there was any complaint of headache today.  Patient did have a complicated migraine in 7517 with slurred speech.  The CT angio head neck does show a vascular deficit.  Discussed with Dr. Rory Percy at [facility].  Patient is a candidate for interventional.  He is getting the team together.  [facility] will come and transfer the patient there.    Fredia Sorrow, MD 10/02/19 2053    Fredia Sorrow, MD 10/02/19 2110   According to Dr. Rory Percy wants patient to go to the bridge of the emergency department at [facility].  He will meet them there.  And from there they will go to the interventional suite.  Dr. Rory Percy is excepting physician.    Fredia Sorrow, MD 10/02/19 2114

## 2019-10-02 NOTE — Anesthesia Procedure Notes (Signed)
Arterial Line Insertion Start/End3/02/2020 10:05 PM, 10/02/2019 10:06 PM Performed by: Zollie Scale, CRNA, CRNA  Patient location: OOR procedure area. Preanesthetic checklist: patient identified, IV checked, site marked, risks and benefits discussed, surgical consent, monitors and equipment checked, pre-op evaluation, timeout performed and anesthesia consent Emergency situation Left, radial was placed Catheter size: 20 G Hand hygiene performed , maximum sterile barriers used  and Seldinger technique used Allen's test indicative of satisfactory collateral circulation Attempts: 1 Procedure performed without using ultrasound guided technique. Following insertion, dressing applied and Biopatch. Post procedure assessment: normal  Patient tolerated the procedure well with no immediate complications.

## 2019-10-02 NOTE — ED Notes (Signed)
Pt depart with [facility] 

## 2019-10-02 NOTE — H&P (Signed)
Stroke neurology history and physical Status post TPA at outside hospital  Transferred for higher level of care and EVT  CC: Right-sided flaccid paralysis, speech difficulty  History is obtained from: Chart review, family over phone  HPI: Erin Peck is a 54 y.o. female with known past medical history, presenting to an outside hospital for symptoms consistent with a left hemispheric stroke-right-sided weakness and speech deficits. Evaluated by telemedicine neurology emergently and deemed to be a candidate for IV TPA due to last known normal being within 4-1/2 hours-last known normal at 1750 hrs. on 10/02/2019. Further imaging obtained-CTA head and neck revealed a left M1 occlusion/significant stenosis, and a high NIH, makes her a candidate for intervention. Discussed with the telemedicine neurologist over the phone and accepted patient directly to come to the neuro interventional radiology suite. IR code stroke activated prior to patient's arrival to [facility].  Anesthesia team and IR team ready to receive the patient upon arrival at [facility].   Addendum Spoke with the family members who arrived at the hospital later at around 10:30 PM. The report that she has not seen a doctor in many many years and has no known past medical history but that is mostly because of not having seen a doctor.  She at baseline is able to carry all her ADLs independently.  She was seen last year for some focal neurological symptoms that were deemed to be secondary to a complex migraine.  Has not had the symptoms again up until today symptoms of the paralysis and speech difficulties.   LKW: 5:50 PM on 10/02/2019 tpa given?:  Yes, by telemedicine neurology at [facility] Premorbid modified Rankin scale (mRS): 0   ROS:  Unable to obtain due to mild aphasia  No past medical history on file.   No family history on file.  According to the family members, both her parents had strokes at young age.  Both  parents were in their 34s.  Social History:   has no history on file for tobacco, alcohol, and drug.  Medications  Current Facility-Administered Medications:  .   stroke: mapping our early stages of recovery book, , Does not apply, Once, Milon Dikes, MD .  0.9 %  sodium chloride infusion, , Intravenous, Continuous, Milon Dikes, MD .  acetaminophen (TYLENOL) tablet 650 mg, 650 mg, Oral, Q4H PRN **OR** acetaminophen (TYLENOL) 160 MG/5ML solution 650 mg, 650 mg, Per Tube, Q4H PRN **OR** acetaminophen (TYLENOL) suppository 650 mg, 650 mg, Rectal, Q4H PRN, Milon Dikes, MD .  labetalol (NORMODYNE) injection 20 mg, 20 mg, Intravenous, Once **AND** clevidipine (CLEVIPREX) infusion 0.5 mg/mL, 0-21 mg/hr, Intravenous, Continuous, Milon Dikes, MD .  labetalol (NORMODYNE) 5 MG/ML injection, , , ,  .  pantoprazole (PROTONIX) injection 40 mg, 40 mg, Intravenous, QHS, Milon Dikes, MD .  senna-docusate (Senokot-S) tablet 1 tablet, 1 tablet, Oral, QHS PRN, Milon Dikes, MD Exam: Current vital signs: BP 106/71   Pulse (!) 107   Temp 98.5 F (36.9 C)   Resp (!) 22   Ht 5\' 5"  (1.651 m)   Wt 60 kg   SpO2 95%   BMI 22.02 kg/m  Vital signs in last 24 hours: Temp:  [98.5 F (36.9 C)] 98.5 F (36.9 C) (03/08 2100) Pulse Rate:  [87-113] 107 (03/08 2115) Resp:  [16-30] 22 (03/08 2115) BP: (94-165)/(57-131) 106/71 (03/08 2115) SpO2:  [88 %-98 %] 95 % (03/08 2115) Weight:  [60 kg] 60 kg (03/08 1952) General: Awake alert in no  distress HEENT: [place] atraumatic Neck: Clear to auscultation Cardiovascular: Regular rate rhythm Abdomen soft nondistended nontender Extremities warm well perfused Neurological exam Awake alert oriented x3 Speech is mildly dysarthric She is mildly aphasic with word finding difficulty and hesitancy in her speech. She is able to follow commands. Cranial nerves: Pupils are equal round reactive to light, extraocular movement exam shows left gaze preference and inability  to cross the midline to look to right, does not blink to threat from the right-likely right homonymous hemianopsia, right lower facial weakness observed on exam. Motor exam: Right upper extremity flaccid 0/5, right lower extremity barely 3/5 with drift to bed within 5 seconds.  Left upper and lower extremity full-strength 5/5 Sensory exam: Diminished to light touch significantly on the right in comparison to the left.  Also probably neglecting the right side on double simultaneous stimulation. Coordination: No ataxia on the left, unable to perform on the right. Gait testing deferred at this time NIH stroke scale 1a Level of Conscious.: 0 1b LOC Questions: 0 1c LOC Commands: 0 2 Best Gaze: 1 3 Visual: 2 4 Facial Palsy: 2 5a Motor Arm - left: 0 5b Motor Arm - Right: 4 6a Motor Leg - Left: 0 6b Motor Leg - Right: 2 7 Limb Ataxia: 0 8 Sensory: 2 9 Best Language: 1 10 Dysarthria: 1 11 Extinct. and Inatten.: 1 TOTAL: 16  Labs I have reviewed labs in epic and the results pertinent to this consultation are:  CBC    Component Value Date/Time   WBC 14.1 (H) 10/02/2019 1953   RBC 4.81 10/02/2019 1953   HGB 14.7 10/02/2019 1953   HCT 45.3 10/02/2019 1953   PLT 366 10/02/2019 1953   MCV 94.2 10/02/2019 1953   MCH 30.6 10/02/2019 1953   MCHC 32.5 10/02/2019 1953   RDW 12.7 10/02/2019 1953   LYMPHSABS 2.8 10/02/2019 1953   MONOABS 0.6 10/02/2019 1953   EOSABS 0.3 10/02/2019 1953   BASOSABS 0.1 10/02/2019 1953    CMP     Component Value Date/Time   NA 140 10/02/2019 1953   K 4.3 10/02/2019 1953   CL 107 10/02/2019 1953   CO2 25 10/02/2019 1953   GLUCOSE 113 (H) 10/02/2019 1953   BUN 13 10/02/2019 1953   CREATININE 0.88 10/02/2019 1953   CALCIUM 9.2 10/02/2019 1953   PROT 7.4 10/02/2019 1953   ALBUMIN 4.5 10/02/2019 1953   AST 17 10/02/2019 1953   ALT 19 10/02/2019 1953   ALKPHOS 74 10/02/2019 1953   BILITOT 0.4 10/02/2019 1953   GFRNONAA >60 10/02/2019 1953   GFRAA >60  10/02/2019 1953   Imaging I have reviewed the images obtained:  CT-scan of the brain-no bleed.  Aspects 8 for a left MCA infarct  CTA head and neck with no neck occlusion.  Left M1 severe stenosis versus subocclusive thrombus.  Good collaterals. CT perfusion with 19 cc of core and 94 cc of penumbra.  75 cc mismatch.  Assessment: 54 year old with no significant past medical history presented for sudden onset of right-sided weakness and speech difficulty.  Evaluated at an outside hospital in deemed to be a candidate for IV TPA which was administered at the outside hospital by telemedicine neurology.  Further imaging by CT angiogram of the head and neck revealed a left M1 severe stenosis versus subocclusive thrombus.  Within the window for endovascular intervention.  Case discussed with endovascular-Dr. Estanislado Pandy as well as husband over the phone. Explained risk benefits over the phone on a  three-way call with Dr. Corliss Skains and [facility] to the husband who consented for the procedure. Patient accepted as a transfer emergently from [facility] directly to the IR suite under the neuro hospitalist service.  Anesthesia team and neuro IR team were ready when the patient arrived at the [facility] door and went straight for the intervention.  TPA started at 8:15 PM into the telemedicine neurology consultation note  Impression: Acute ischemic stroke-etiology under investigation  Recommendations: Admit to neuro ICU Post TPA vitals and neurochecks Systolic blood pressure goal-for now less than 180 and if revascularization is successful, systolic blood pressure goal between 120-140. Use labetalol and Cleviprex MRI brain in 24 hours 2D echocardiogram A1c Lipid panel PT OT Speech therapy Gentle hydration with fluids 75 cc an hour of normal saline Check labs and replete electrolytes as necessary If the patient remains intubated post procedure, will consult PCCM for vent management and  medical management. Will need hypercoagulable work-up-as an outpatient as she has received TPA and the work-up if performed now might not yield meaningful information. Might need TEE and loop recorder.  -- Milon Dikes, MD [facility] Pager: [phone] If 7pm to 7am, please call on call as listed on AMION.   CRITICAL CARE ATTESTATION Performed by: Milon Dikes, MD Total critical care time: 65 minutes Critical care time was exclusive of separately billable procedures and treating other patients and/or supervising APPs/Residents/Students Critical care was necessary to treat or prevent imminent or life-threatening deterioration due to acute ischemic stroke. This patient is critically ill and at significant risk for neurological worsening and/or death and care requires constant monitoring. Critical care was time spent personally by me on the following activities: development of treatment plan with patient and/or surrogate as well as nursing, discussions with consultants, evaluation of patient's response to treatment, examination of patient, obtaining history from patient or surrogate, ordering and performing treatments and interventions, ordering and review of laboratory studies, ordering and review of radiographic studies, pulse oximetry, re-evaluation of patient's condition, participation in multidisciplinary rounds and medical decision making of high complexity in the care of this patient.

## 2019-10-02 NOTE — Consult Note (Signed)
[facility] Tele[facility]urology Consult Services   Date of Service:   10/02/2019 19:44:30  Impression:     .  I63.9 - Cerebrovascular accident (CVA), unspecified mechanism ([place])  Comments/Sign-Out: she received alteplase and her imaging is suggestive of an M1 occlusion/partial occlusion so neuro interventional radiology is going to attempt thrombectomy  Metrics: Last Known Well: 10/02/2019 17:50:00 [facility] Notification Time: 10/02/2019 19:44:30 Arrival Time: 10/02/2019 19:43:00 Stamp Time: 10/02/2019 19:44:30 Time First Login Attempt: 10/02/2019 19:46:59 Symptoms: weakness NIHSS Start Assessment Time: 10/02/2019 19:56:02 Alteplase Early Mix Decision Time: 10/02/2019 21:06:55 Patient is a candidate for Alteplase/Activase. Alteplase Medical Decision: 10/02/2019 20:07:58 Alteplase/Activase CPOE Order Time: 10/02/2019 20:09:27 Needle Time: 10/02/2019 20:15:37 Weight Noted by Staff: 60 kg  CT head showed no acute hemorrhage or acute core infarct.  Lower Likelihood of Large Vessel Occlusion but Following Stat Studies are Recommended  CTA Head and Neck. CT Perfusion. Showed left m1 occlusion/partial occlusion   Discussed with Neurointerventionalist on 10/02/2019 21:06:15  Alteplase/Activase Contraindications:  Last Known Well > 4.5 hours: No CT Head showing hemorrhage: No Ischemic stroke within 3 months: No Severe head trauma within 3 months: No Intracranial/intraspinal surgery within 3 months: No History of intracranial hemorrhage: No Symptoms and signs consistent with an SAH: No GI malignancy or GI bleed within 21 days: No Coagulopathy: Platelets <100 000/mm3, INR >1.7, aPTT>40 s, or PT >15 s: No Treatment dose of LMWH within the previous 24 hrs: No Use of NOACs in past 48 hours: No Glycoprotein IIb/IIIa receptor inhibitors use: No Symptoms consistent with infective endocarditis: No Suspected aortic arch dissection: No Intra-axial intracranial neoplasm:  No  Verbal Consent to Alteplase/Activase: I have explained to the Patient and Family the nature of the patient's condition, reviewed the indications and contraindications to the use of Alteplase/Activase fibrinolytic agent, reviewed the indications and contraindications and the benefits to be reasonably expected compared with alternative approaches. I have discussed the likelihood of major risks or complications of this procedure including (if applicable) but not limited to loss of limb function, brain damage, paralysis, hemorrhage, infection, complications from transfusion of blood components, drug reactions, blood clots and loss of life. I have also indicated that with any procedure there is always the possibility of an unexpected complication. All questions were answered and Patient and Family express understanding of the treatment plan and consent to the treatment.  Our recommendations are outlined below.  Recommendations: IV Alteplase/Activase recommended.  Alteplase/Activase bolus given Without Complication.   IV Alteplase/Activase Total Dose - 54.0 mg IV Alteplase/Activase Bolus Dose - 5.4 mg IV Alteplase/Activase Infusion Dose - 48.6 mg  Routine post Alteplase/Activase monitoring including neuro checks and blood pressure control during/after treatment Monitor blood pressure Check blood pressure and NIHSS every 15 min for 2 h, then every 30 min for 6 h, and finally every hour for 16 h.  Manage Blood Pressure per post Alteplase/Activase protocol.      .  Admission to ICU     .  CT brain 24 hours post Alteplase/Activase     .  NPO until swallowing screen performed and passed     .  No antiplatelet agents or anticoagulants (including heparin for DVT prophylaxis) in first 24 hours     .  No Foley catheter, nasogastric tube, arterial catheter or central venous catheter for 24 hr, unless absolutely necessary     .  Telemetry     .  Bedside swallow evaluation     .  HOB less than 30  degrees     .  Euglycemia     .  Avoid hyperthermia, PRN acetaminophen     .  DVT prophylaxis     .  Inpatient Neurology Consultation     .  Stroke evaluation as per inpatient neurology recommendations  Discussed with ED physician    ------------------------------------------------------------------------------  History of Present Illness: Patient is a 54 year old Female.  Patient was brought by EMS for symptoms of weakness  This is a 54 yo female who developed weakness of the right side of her body today. She is also having slurred speech. She has a very minor head pain, but nothing significant. She had slurred speech in 2019 that resolved that same day and was attributed to a migraine.   Anticoagulant use:  No  Antiplatelet use: No   Examination: BP(152/59), Pulse(85), Blood Glucose(124) 1A: Level of Consciousness - Alert; keenly responsive + 0 1B: Ask Month and Age - Both Questions Right + 0 1C: Blink Eyes & Squeeze Hands - Performs Both Tasks + 0 2: Test Horizontal Extraocular Movements - Normal + 0 3: Test Visual Fields - No Visual Loss + 0 4: Test Facial Palsy (Use Grimace if Obtunded) - Partial paralysis (lower face) + 2 5A: Test Left Arm Motor Drift - No Drift for 10 Seconds + 0 5B: Test Right Arm Motor Drift - No Effort Against Gravity + 3 6A: Test Left Leg Motor Drift - No Drift for 5 Seconds + 0 6B: Test Right Leg Motor Drift - Some Effort Against Gravity + 2 7: Test Limb Ataxia (FNF/Heel-Shin) - No Ataxia + 0 8: Test Sensation - Normal; No sensory loss + 0 9: Test Language/Aphasia - Normal; No aphasia + 0 10: Test Dysarthria - Mild-Moderate Dysarthria: Slurring but can be understood + 1 11: Test Extinction/Inattention - No abnormality + 0  NIHSS Score: 8  Pre-Morbid Modified Ranking Scale: 0 Points = No symptoms at all   Patient/Family was informed the Neurology Consult would occur via TeleHealth consult by way of interactive audio and video  telecommunications and consented to receiving care in this manner.   Due to the immediate potential for life-threatening deterioration due to underlying acute neurologic illness, I spent 20 minutes providing critical care. This time includes time for face to face visit via telemedicine, review of medical records, imaging studies and discussion of findings with providers, the patient and/or family.   Dr Georgina Snell   [facility] [phone]  Case [id number]

## 2019-10-02 NOTE — Anesthesia Procedure Notes (Signed)
Procedure Name: Intubation Date/Time: 10/02/2019 9:56 PM Performed by: Zollie Scale, CRNA Pre-anesthesia Checklist: Patient identified, Emergency Drugs available, Suction available and Patient being monitored Patient Re-evaluated:Patient Re-evaluated prior to induction Oxygen Delivery Method: Circle System Utilized Preoxygenation: Pre-oxygenation with 100% oxygen Induction Type: IV induction, Rapid sequence and Cricoid Pressure applied Laryngoscope Size: Glidescope and 3 Grade View: Grade I Tube type: Oral Tube size: 7.0 mm Number of attempts: 1 Airway Equipment and Method: Stylet and Video-laryngoscopy Placement Confirmation: ETT inserted through vocal cords under direct vision,  positive ETCO2 and breath sounds checked- equal and bilateral Secured at: 22 cm Tube secured with: Tape Dental Injury: Teeth and Oropharynx as per pre-operative assessment

## 2019-10-02 NOTE — Anesthesia Preprocedure Evaluation (Signed)
Anesthesia Evaluation   Patient confused    Reviewed: Unable to perform ROS - Chart review onlyPreop documentation limited or incomplete due to emergent nature of procedure.  Airway        Dental   Pulmonary           Cardiovascular      Neuro/Psych CVA    GI/Hepatic   Endo/Other    Renal/GU      Musculoskeletal   Abdominal   Peds  Hematology   Anesthesia Other Findings   Reproductive/Obstetrics                             Anesthesia Physical Anesthesia Plan  ASA: III and emergent  Anesthesia Plan: General   Post-op Pain Management:    Induction: Intravenous, Rapid sequence and Cricoid pressure planned  PONV Risk Score and Plan:   Airway Management Planned: Oral ETT  Additional Equipment:   Intra-op Plan:   Post-operative Plan: Possible Post-op intubation/ventilation  Informed Consent:     History available from chart only and Only emergency history available  Plan Discussed with:   Anesthesia Plan Comments: (No medical history in chart, no covid test result, patient at [facility], IR team yet to arrive, consent not available)        Anesthesia Quick Evaluation

## 2019-10-02 NOTE — ED Triage Notes (Addendum)
Pt arrives from home via EMS CODE STROKE. Pt has right sided facial droop, right arm paralysis, and right leg paralysis that started at 1750 today.

## 2019-10-03 ENCOUNTER — Inpatient Hospital Stay (HOSPITAL_COMMUNITY): Payer: Medicaid Other

## 2019-10-03 DIAGNOSIS — J9620 Acute and chronic respiratory failure, unspecified whether with hypoxia or hypercapnia: Secondary | ICD-10-CM

## 2019-10-03 DIAGNOSIS — I63412 Cerebral infarction due to embolism of left middle cerebral artery: Principal | ICD-10-CM

## 2019-10-03 DIAGNOSIS — J988 Other specified respiratory disorders: Secondary | ICD-10-CM

## 2019-10-03 DIAGNOSIS — I6389 Other cerebral infarction: Secondary | ICD-10-CM

## 2019-10-03 DIAGNOSIS — I63512 Cerebral infarction due to unspecified occlusion or stenosis of left middle cerebral artery: Secondary | ICD-10-CM | POA: Diagnosis present

## 2019-10-03 DIAGNOSIS — J9601 Acute respiratory failure with hypoxia: Secondary | ICD-10-CM

## 2019-10-03 DIAGNOSIS — E782 Mixed hyperlipidemia: Secondary | ICD-10-CM

## 2019-10-03 HISTORY — PX: IR PATIENT EVAL TECH 0-60 MINS: IMG5564

## 2019-10-03 HISTORY — PX: IR CT HEAD LTD: IMG2386

## 2019-10-03 HISTORY — PX: IR INTRA CRAN STENT: IMG2345

## 2019-10-03 HISTORY — PX: IR PERCUTANEOUS ART THROMBECTOMY/INFUSION INTRACRANIAL INC DIAG ANGIO: IMG6087

## 2019-10-03 LAB — CBC WITH DIFFERENTIAL/PLATELET
Abs Immature Granulocytes: 0.04 10*3/uL (ref 0.00–0.07)
Basophils Absolute: 0 10*3/uL (ref 0.0–0.1)
Basophils Relative: 0 %
Eosinophils Absolute: 0 10*3/uL (ref 0.0–0.5)
Eosinophils Relative: 0 %
HCT: 40.9 % (ref 36.0–46.0)
Hemoglobin: 13.8 g/dL (ref 12.0–15.0)
Immature Granulocytes: 0 %
Lymphocytes Relative: 15 %
Lymphs Abs: 1.7 10*3/uL (ref 0.7–4.0)
MCH: 31.1 pg (ref 26.0–34.0)
MCHC: 33.7 g/dL (ref 30.0–36.0)
MCV: 92.1 fL (ref 80.0–100.0)
Monocytes Absolute: 0.3 10*3/uL (ref 0.1–1.0)
Monocytes Relative: 2 %
Neutro Abs: 9.4 10*3/uL — ABNORMAL HIGH (ref 1.7–7.7)
Neutrophils Relative %: 83 %
Platelets: 356 10*3/uL (ref 150–400)
RBC: 4.44 MIL/uL (ref 3.87–5.11)
RDW: 12.8 % (ref 11.5–15.5)
WBC: 11.4 10*3/uL — ABNORMAL HIGH (ref 4.0–10.5)
nRBC: 0 % (ref 0.0–0.2)

## 2019-10-03 LAB — BASIC METABOLIC PANEL
Anion gap: 9 (ref 5–15)
BUN: 9 mg/dL (ref 6–20)
CO2: 22 mmol/L (ref 22–32)
Calcium: 8.9 mg/dL (ref 8.9–10.3)
Chloride: 108 mmol/L (ref 98–111)
Creatinine, Ser: 0.79 mg/dL (ref 0.44–1.00)
GFR calc Af Amer: >60 mL/min (ref >60)
GFR calc non Af Amer: >60 mL/min (ref >60)
Glucose, Bld: 171 mg/dL — ABNORMAL HIGH (ref 70–99)
Potassium: 4.1 mmol/L (ref 3.5–5.1)
Sodium: 139 mmol/L (ref 135–145)

## 2019-10-03 LAB — URINALYSIS, ROUTINE W REFLEX MICROSCOPIC
Bacteria, UA: NONE SEEN
Bilirubin Urine: NEGATIVE
Glucose, UA: NEGATIVE mg/dL
Ketones, ur: NEGATIVE mg/dL
Leukocytes,Ua: NEGATIVE
Nitrite: NEGATIVE
Protein, ur: NEGATIVE mg/dL
Specific Gravity, Urine: 1.018 (ref 1.005–1.030)
pH: 5 (ref 5.0–8.0)

## 2019-10-03 LAB — ECHOCARDIOGRAM COMPLETE
Height: 65 in
Weight: 2613.77 oz

## 2019-10-03 LAB — PLATELET INHIBITION P2Y12

## 2019-10-03 LAB — POCT I-STAT 7, (LYTES, BLD GAS, ICA,H+H)
Acid-base deficit: 3 mmol/L — ABNORMAL HIGH (ref 0.0–2.0)
Bicarbonate: 23.4 mmol/L (ref 20.0–28.0)
Calcium, Ion: 1.25 mmol/L (ref 1.15–1.40)
HCT: 40 % (ref 36.0–46.0)
Hemoglobin: 13.6 g/dL (ref 12.0–15.0)
O2 Saturation: 98 %
Patient temperature: 97.3
Potassium: 3.8 mmol/L (ref 3.5–5.1)
Sodium: 139 mmol/L (ref 135–145)
TCO2: 25 mmol/L (ref 22–32)
pCO2 arterial: 43.4 mmHg (ref 32.0–48.0)
pH, Arterial: 7.336 — ABNORMAL LOW (ref 7.350–7.450)
pO2, Arterial: 106 mmHg (ref 83.0–108.0)

## 2019-10-03 LAB — MRSA PCR SCREENING: MRSA by PCR: NEGATIVE

## 2019-10-03 LAB — LIPID PANEL
Cholesterol: 236 mg/dL — ABNORMAL HIGH (ref 0–200)
HDL: 49 mg/dL (ref >40)
LDL Cholesterol: 152 mg/dL — ABNORMAL HIGH (ref 0–99)
Total CHOL/HDL Ratio: 4.8 RATIO
Triglycerides: 177 mg/dL — ABNORMAL HIGH (ref <150)
VLDL: 35 mg/dL (ref 0–40)

## 2019-10-03 LAB — RAPID URINE DRUG SCREEN, HOSP PERFORMED
Amphetamines: POSITIVE — AB
Barbiturates: NOT DETECTED
Benzodiazepines: NOT DETECTED
Cocaine: NOT DETECTED
Opiates: NOT DETECTED
Tetrahydrocannabinol: NOT DETECTED

## 2019-10-03 LAB — TRIGLYCERIDES: Triglycerides: 187 mg/dL — ABNORMAL HIGH (ref <150)

## 2019-10-03 LAB — HIV ANTIBODY (ROUTINE TESTING W REFLEX): HIV Screen 4th Generation wRfx: NONREACTIVE

## 2019-10-03 LAB — HEMOGLOBIN A1C
Hgb A1c MFr Bld: 5.3 % (ref 4.8–5.6)
Mean Plasma Glucose: 105.41 mg/dL

## 2019-10-03 IMAGING — DX DG CHEST 1V PORT
1 series · 1 of 1 positions shown · non-contrast
Comparison: CTA head and neck earlier tonight.

CLINICAL DATA: 53-year-old female status post endovascular
treatment of left MCA M1 emergent large vessel occlusion.

EXAM:
PORTABLE CHEST 1 VIEW

[chest ap]
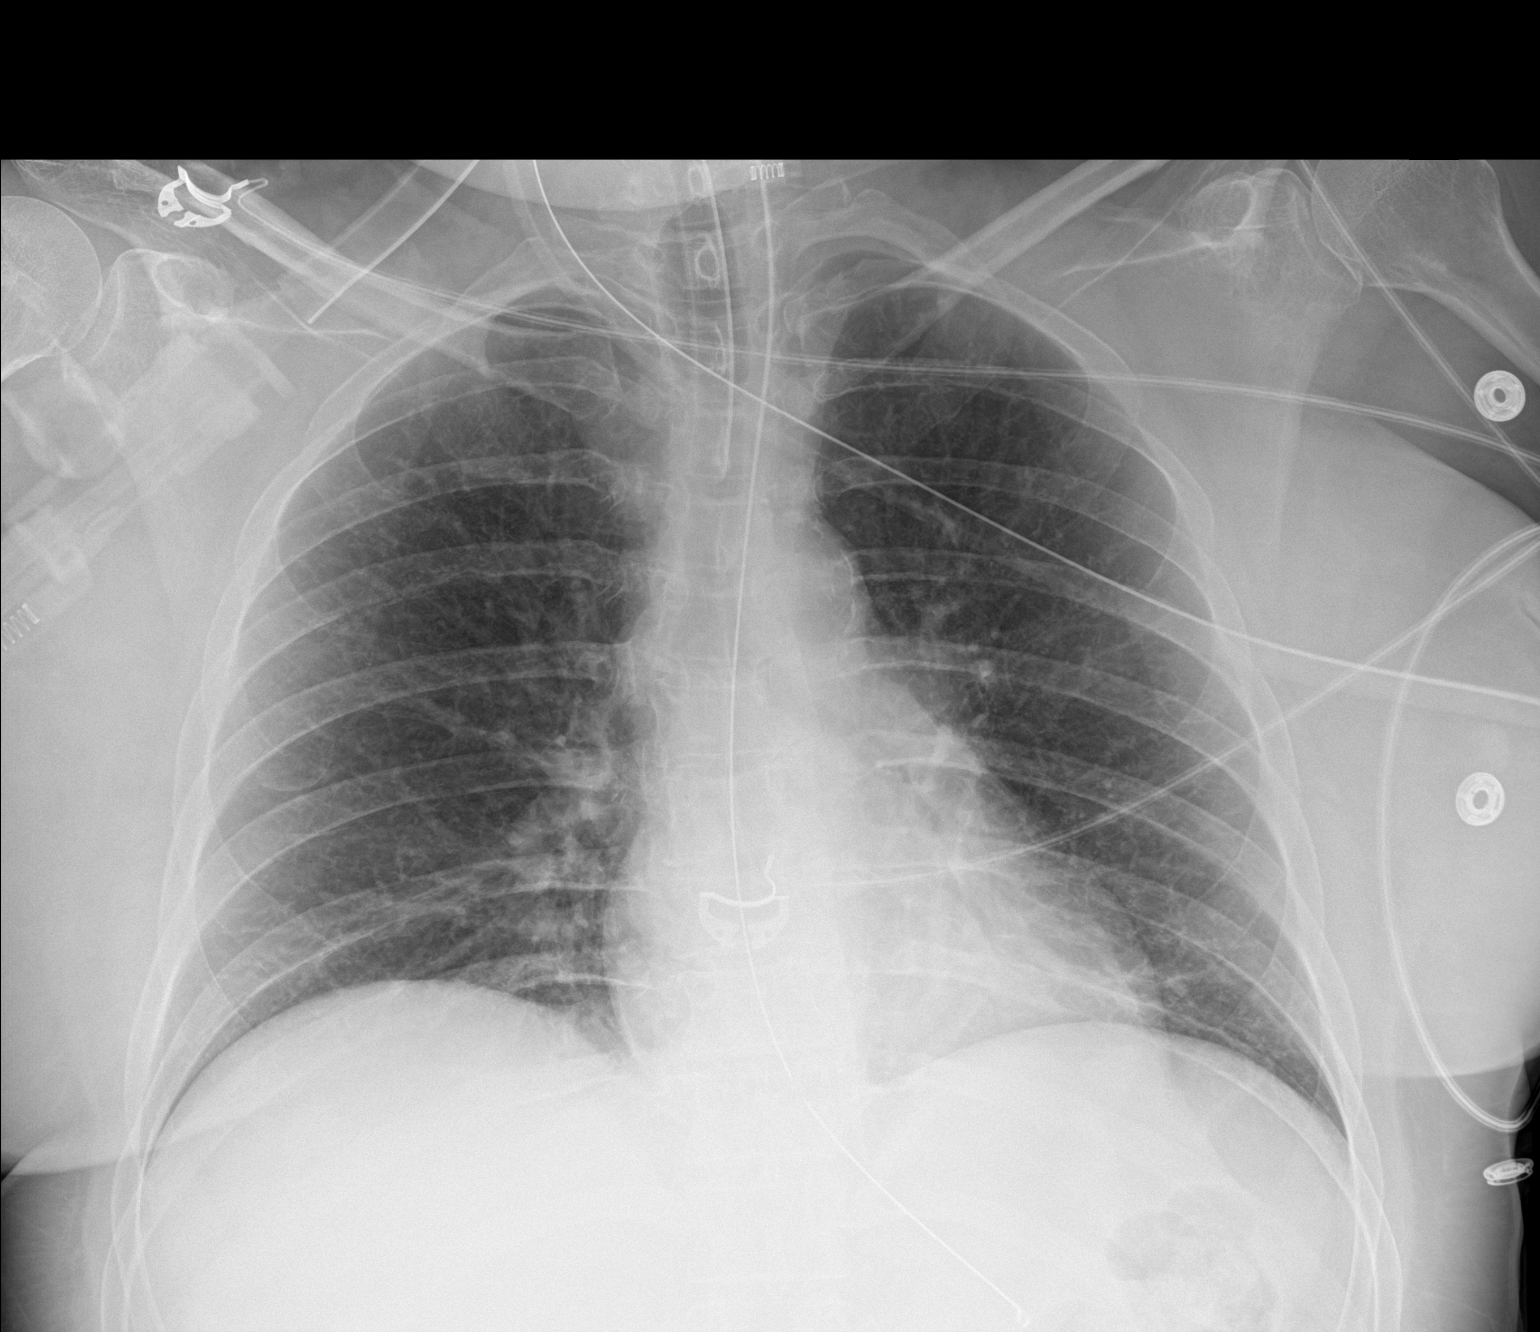

[1 of 1 positions shown; findings below may reference images not displayed]

FINDINGS: Portable AP supine view at [WO] hours. Endotracheal tube tip in good
position between the level the clavicles and carina. Enteric tube
courses to the stomach but the side hole is at the level of the
distal esophagus.

Normal cardiac size and mediastinal contours. Allowing for portable
technique the lungs are clear. No acute osseous abnormality
identified. Negative visible bowel gas pattern.
IMPRESSION: 1. Advance the enteric tube 5 cm to ensure side hole placement
within the stomach.
2. Endotracheal tube tip in good position.
3.  No acute cardiopulmonary abnormality.

## 2019-10-03 IMAGING — MR MR HEAD W/O CM
5 of 12 series · 20 of 48 positions shown · non-contrast
Comparison: CTA head and neck, CT Perfusion yesterday.
COMPARISON: CTA head and neck, CT Perfusion yesterday.

Addendum:
CLINICAL DATA: 53-year-old female status post emergent large vessel
occlusion, left M1 thrombus treated with endovascular reperfusion
with rescue stenting.

EXAM:
MRI HEAD WITHOUT CONTRAST
MRA HEAD WITHOUT CONTRAST
TECHNIQUE: Multiplanar, multiecho pulse sequences of the brain and surrounding
structures were obtained without intravenous contrast. Angiographic
images of the head were obtained using MRA technique without
contrast.

[Series 2: DWI · axial · 3.0mm · 0.94mm/px · z∈[-110,+28]mm · 6 of 98 slices shown (1 of 3)]
[im 1/98]
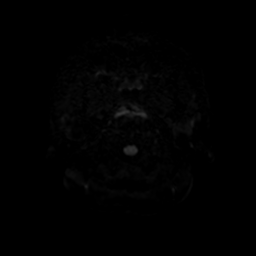
[im 20/98]
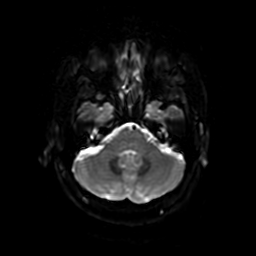
[im 39/98]
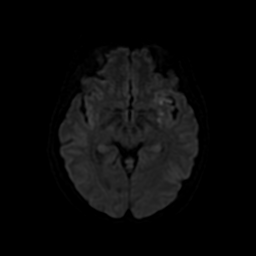
[im 59/98]
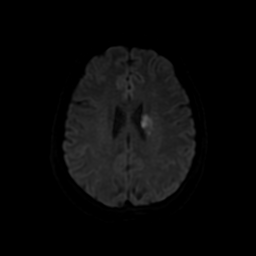
[im 78/98]
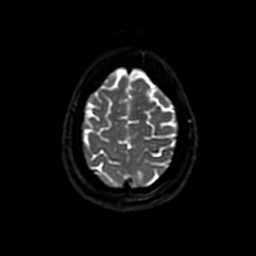
[im 98/98]
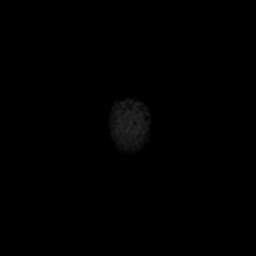

[Series 3: DWI · coronal · 4.0mm · 0.94mm/px · 4 of 74 slices shown (2 of 3)]
[im 1/74]
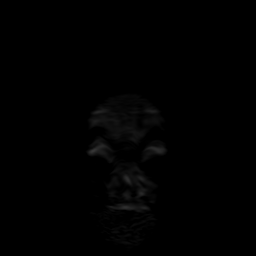
[im 25/74]
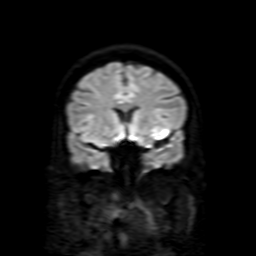
[im 49/74]
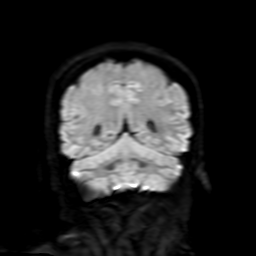
[im 74/74]
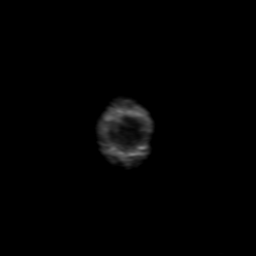

[Series 4: FLAIR · sagittal · 5.0mm · 0.23mm/px · 2 of 26 slices shown (1 of 2)]
[im 1/26]
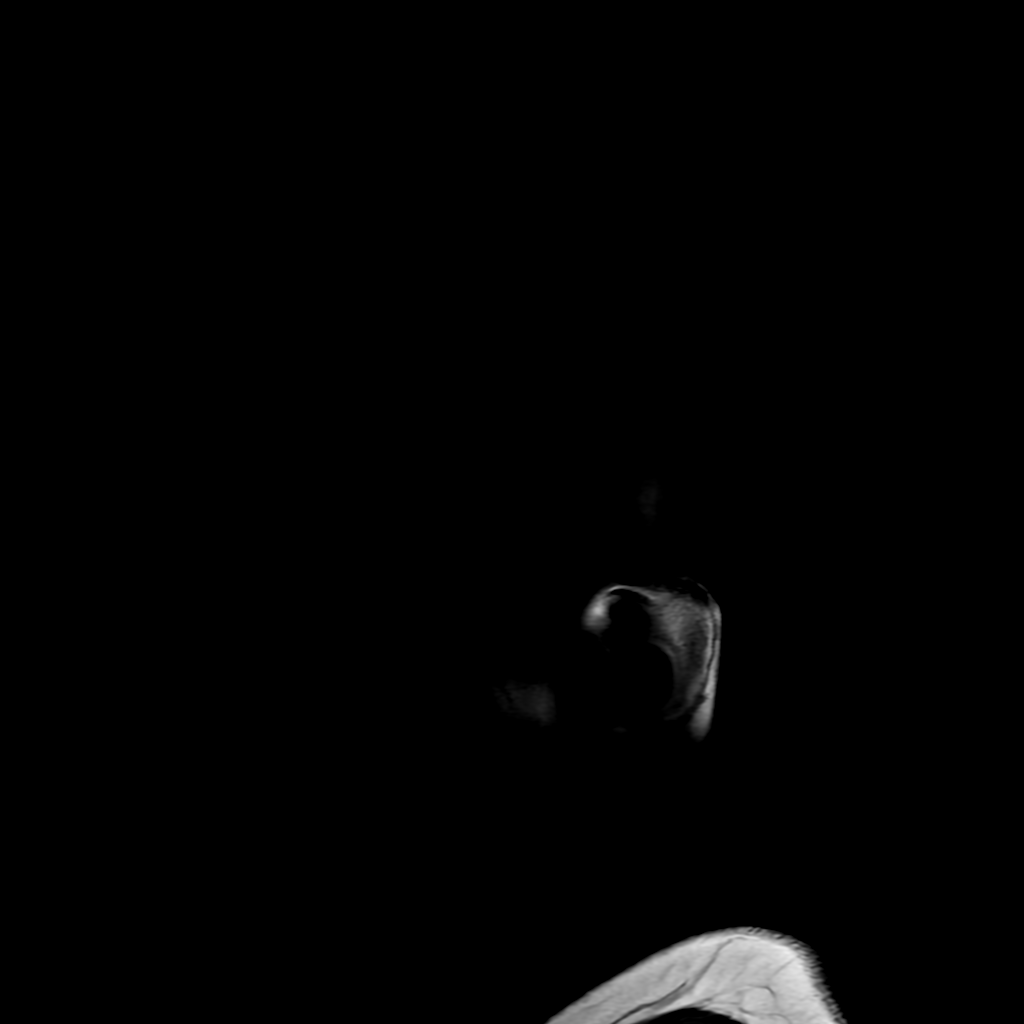
[im 26/26]
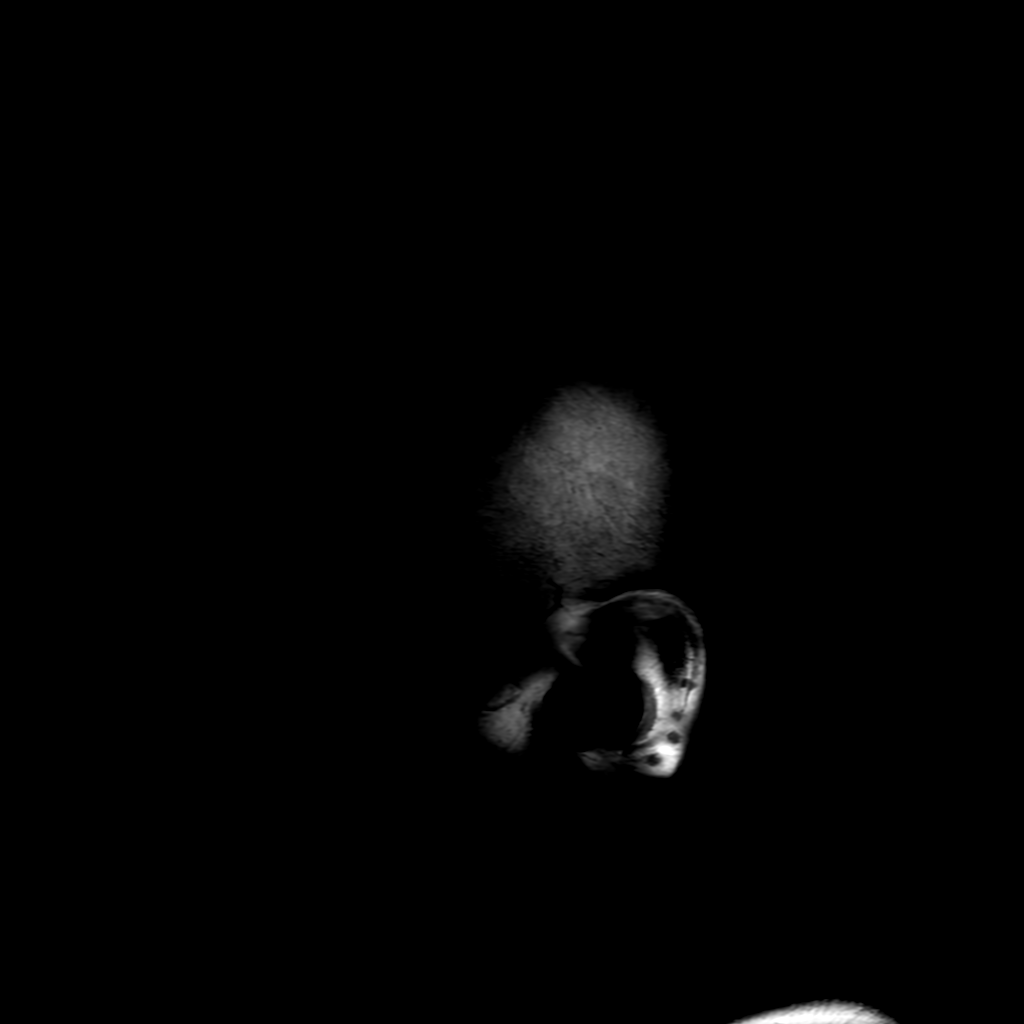

[Series 6: FLAIR · axial · 3.0mm · 0.41mm/px · z∈[-107,+30]mm · 2 of 25 slices shown (2 of 2)]
[im 1/25]
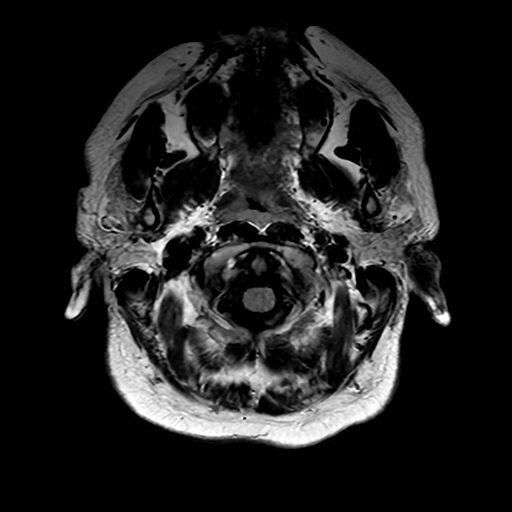
[im 25/25]
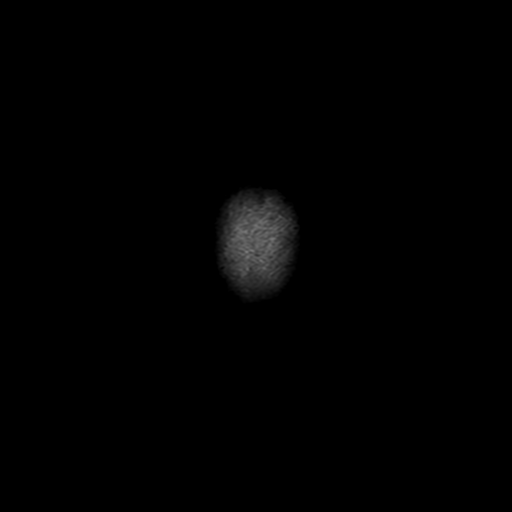

[Series 210: DWI · axial · 3.0mm · 0.94mm/px · z∈[-110,+5]mm · 6 of 98 slices shown (3 of 3)]
[im 1/98]
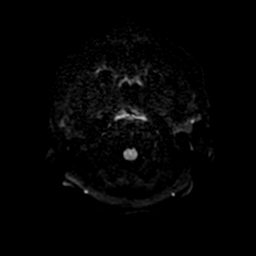
[im 17/98]
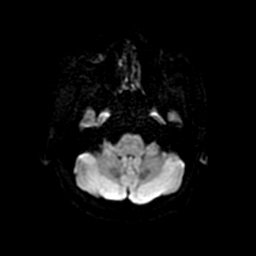
[im 33/98]
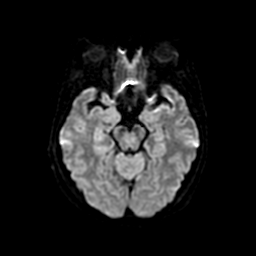
[im 49/98]
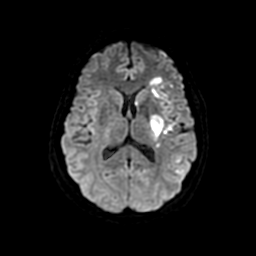
[im 65/98]
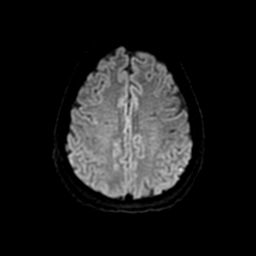
[im 81/98]
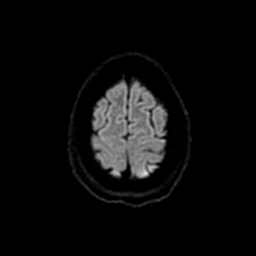

[20 of 48 positions shown; findings below may reference images not displayed]

FINDINGS: MRI HEAD FINDINGS

Brain: Left MCA territory restricted diffusion, most confluent at
the posterior left caudate and left lentiform, with additional
scattered small cortical and subcortical areas around the left
insula and left inferior frontal gyrus. This is similar to although
not exactly corresponding to the predicted core infarct on CTP (19
mL on that exam). Associated T2 and FLAIR hyperintensity with no
convincing hemorrhagic transformation. No mass effect.

No contralateral right hemisphere or posterior fossa restricted
diffusion. Scattered bilateral small and mostly subcortical white
matter T2 and FLAIR hyperintense foci. No chronic cortical
encephalomalacia identified, but there is evidence of a chronic left
corona radiata lacunar infarct. No midline shift, mass effect,
evidence of mass lesion, ventriculomegaly, extra-axial collection or
acute intracranial hemorrhage. Cervicomedullary junction and
pituitary are within normal limits.

Vascular: Major intracranial vascular flow voids are preserved.

Skull and upper cervical spine: Negative visible cervical spine.
Visualized bone marrow signal is within normal limits.

Sinuses/Orbits: Negative orbits. Posterior ethmoid and sphenoid
sinus mucosal thickening.

Other: Layering fluid in the pharynx. Mastoids remain clear. The
patient is intubated with an oral enteric tube also partially
visible.

MRA HEAD FINDINGS

Antegrade flow in the posterior circulation with dominant right
vertebral artery. Patent left vertebrobasilar junction. Normal right
PICA origin. Patent basilar artery. Patent AICA, SCA and PCA
origins. Both posterior communicating arteries are present.
Bilateral PCA branches are within normal limits.

Antegrade flow in both ICA siphons. No siphon stenosis identified.
Ophthalmic and posterior communicating artery origins appear normal.
Patent carotid termini. Patent MCA and ACA origins. The left ACA
appears dominant. And there is a 2 mm infundibulum versus aneurysm
directed superiorly from the anterior communicating artery region on
series 2, image 93. But otherwise the visible ACA branches are
within normal limits. Right MCA M1 segment, right MCA bifurcation
and visible right MCA branches are within normal limits.

The left M1 was stented. The left MCA origin is patent, with loss of
most of the left MCA flow signal, although there is flow signal
detected at the left MCA bifurcation. However, there is a paucity of
left MCA branches when compared to the right side. Furthermore,
there is asymmetric FLAIR signal within left MCA M2 and M3 branches
on the anatomic images above.
IMPRESSION: 1. The Left MCA was stented which is at least partially responsible
for the decreased left M1 flow signal. However, decreased
visualization of left MCA branches on MRA and increased branch FLAIR
signal raises the possibility of residual hemodynamically
significant MCA stenosis.

2. MRA is negative for other intracranial stenosis. Although there
is a 2 mm aneurysm versus infundibulum arising cephalad from the
A-comm.

3. MRI reveals patchy left MCA infarcts most confluent at the
posterior caudate and lentiform. Overall infarct volume probably
similar to that predicted on CTP yesterday.

4. No intracranial mass effect or hemorrhagic transformation.

5. Chronic left corona radiata infarct.

ADDENDUM:
Left MCA findings discussed by telephone with Dr. STEPHE on
[DATE] at [AR] hours. And we discussed that a repeat CTA may be
valuable to further quantify the Left MCA reperfusion.

*** End of Addendum ***
FINDINGS: MRI HEAD FINDINGS

Brain: Left MCA territory restricted diffusion, most confluent at
the posterior left caudate and left lentiform, with additional
scattered small cortical and subcortical areas around the left
insula and left inferior frontal gyrus. This is similar to although
not exactly corresponding to the predicted core infarct on CTP (19
mL on that exam). Associated T2 and FLAIR hyperintensity with no
convincing hemorrhagic transformation. No mass effect.

No contralateral right hemisphere or posterior fossa restricted
diffusion. Scattered bilateral small and mostly subcortical white
matter T2 and FLAIR hyperintense foci. No chronic cortical
encephalomalacia identified, but there is evidence of a chronic left
corona radiata lacunar infarct. No midline shift, mass effect,
evidence of mass lesion, ventriculomegaly, extra-axial collection or
acute intracranial hemorrhage. Cervicomedullary junction and
pituitary are within normal limits.

Vascular: Major intracranial vascular flow voids are preserved.

Skull and upper cervical spine: Negative visible cervical spine.
Visualized bone marrow signal is within normal limits.

Sinuses/Orbits: Negative orbits. Posterior ethmoid and sphenoid
sinus mucosal thickening.

Other: Layering fluid in the pharynx. Mastoids remain clear. The
patient is intubated with an oral enteric tube also partially
visible.

MRA HEAD FINDINGS

Antegrade flow in the posterior circulation with dominant right
vertebral artery. Patent left vertebrobasilar junction. Normal right
PICA origin. Patent basilar artery. Patent AICA, SCA and PCA
origins. Both posterior communicating arteries are present.
Bilateral PCA branches are within normal limits.

Antegrade flow in both ICA siphons. No siphon stenosis identified.
Ophthalmic and posterior communicating artery origins appear normal.
Patent carotid termini. Patent MCA and ACA origins. The left ACA
appears dominant. And there is a 2 mm infundibulum versus aneurysm
directed superiorly from the anterior communicating artery region on
series 2, image 93. But otherwise the visible ACA branches are
within normal limits. Right MCA M1 segment, right MCA bifurcation
and visible right MCA branches are within normal limits.

The left M1 was stented. The left MCA origin is patent, with loss of
most of the left MCA flow signal, although there is flow signal
detected at the left MCA bifurcation. However, there is a paucity of
left MCA branches when compared to the right side. Furthermore,
there is asymmetric FLAIR signal within left MCA M2 and M3 branches
on the anatomic images above.
IMPRESSION: 1. The Left MCA was stented which is at least partially responsible
for the decreased left M1 flow signal. However, decreased
visualization of left MCA branches on MRA and increased branch FLAIR
signal raises the possibility of residual hemodynamically
significant MCA stenosis.

2. MRA is negative for other intracranial stenosis. Although there
is a 2 mm aneurysm versus infundibulum arising cephalad from the
A-comm.

3. MRI reveals patchy left MCA infarcts most confluent at the
posterior caudate and lentiform. Overall infarct volume probably
similar to that predicted on CTP yesterday.

4. No intracranial mass effect or hemorrhagic transformation.

5. Chronic left corona radiata infarct.

## 2019-10-03 IMAGING — DX DG ABD PORTABLE 1V
1 series · 1 of 1 positions shown · non-contrast
Comparison: Chest x-ray, same date.

CLINICAL DATA: NG tube placement.

EXAM:
PORTABLE ABDOMEN - 1 VIEW

[abdomen]
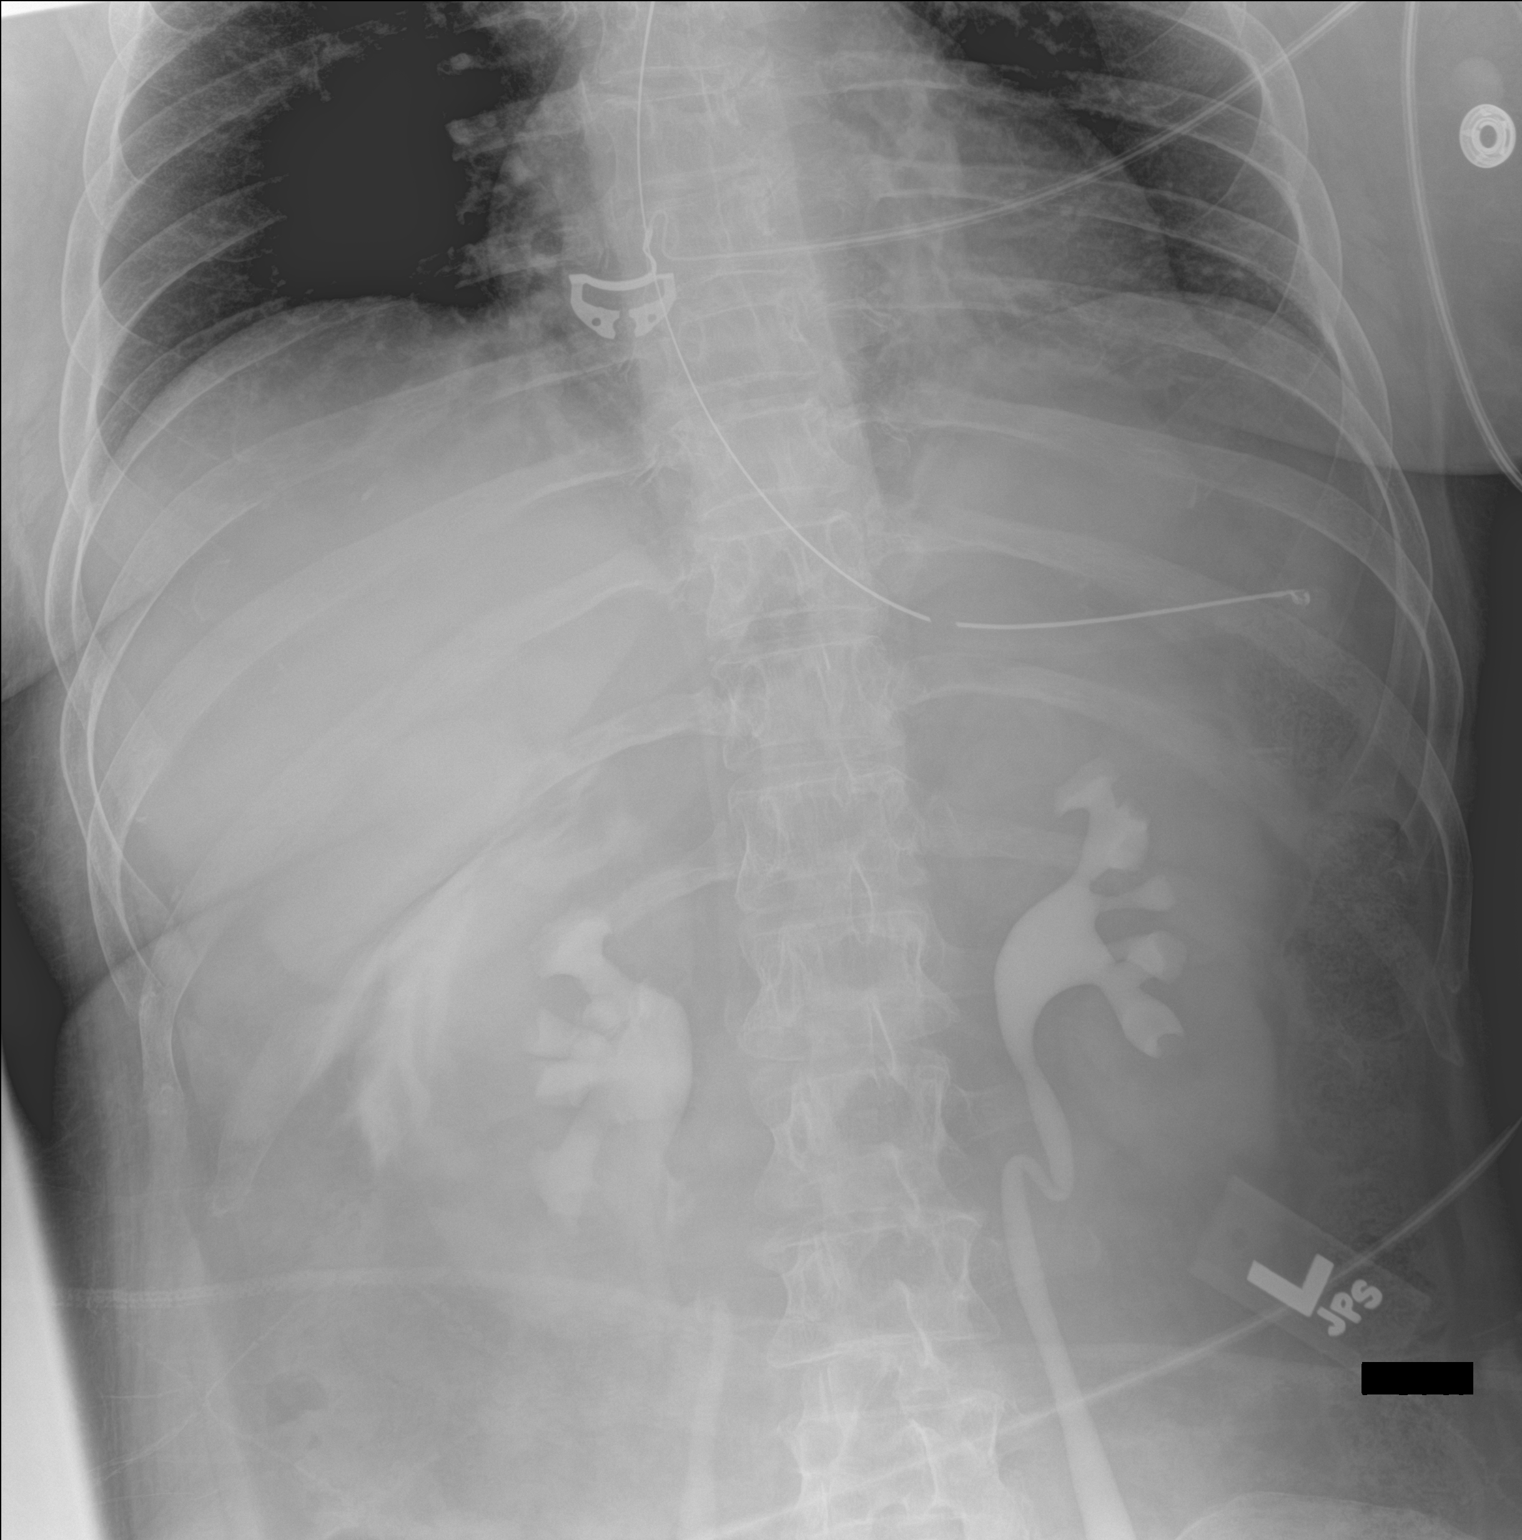

[1 of 1 positions shown; findings below may reference images not displayed]

FINDINGS: The NG tube is in good position with its tip in the body region the
stomach laterally. The proximal port is now well below the GE
junction.

Contrast noted in both renal collecting systems and ureters. There
is also some contrast surrounding the right kidney suggesting a
urinary leak or obstruction. Recommend clinical correlation.
IMPRESSION: 1. NG tube in good position.
2. Contrast surrounding the right kidney suggesting a urinary leak
or obstruction. Recommend clinical correlation. CT abdomen/pelvis
may be helpful for further evaluation.

## 2019-10-03 IMAGING — MR MR MRA HEAD W/O CM
1 series · 14 of 48 positions shown · non-contrast
Comparison: CTA head and neck, CT Perfusion yesterday.
COMPARISON: CTA head and neck, CT Perfusion yesterday.

Addendum:
CLINICAL DATA: 53-year-old female status post emergent large vessel
occlusion, left M1 thrombus treated with endovascular reperfusion
with rescue stenting.

EXAM:
MRI HEAD WITHOUT CONTRAST
MRA HEAD WITHOUT CONTRAST
TECHNIQUE: Multiplanar, multiecho pulse sequences of the brain and surrounding
structures were obtained without intravenous contrast. Angiographic
images of the head were obtained using MRA technique without
contrast.

[Series 2: ax (id) · axial · 1.0mm · 0.43mm/px · z∈[-96,-12]mm · 14 of 184 slices shown]
[im 1/184]
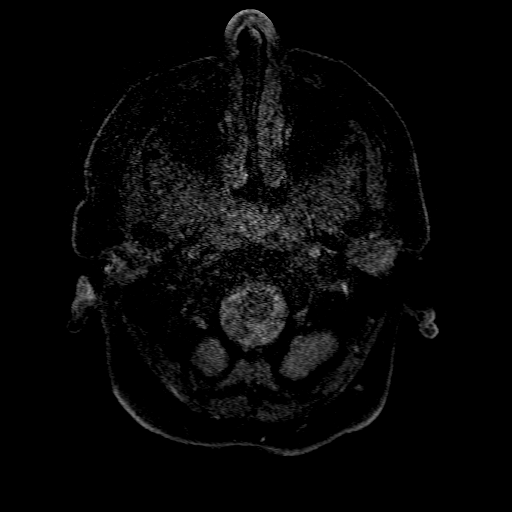
[im 4/184]
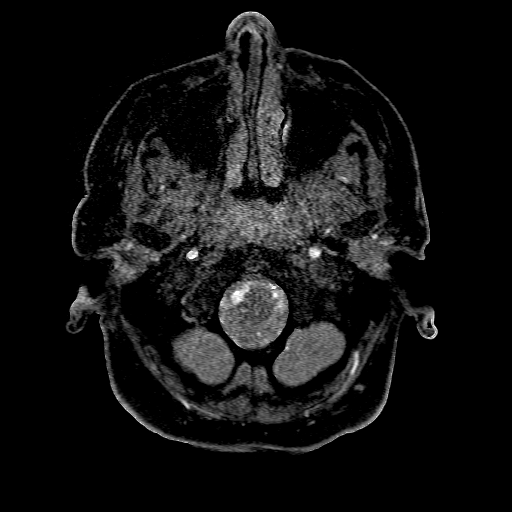
[im 8/184]
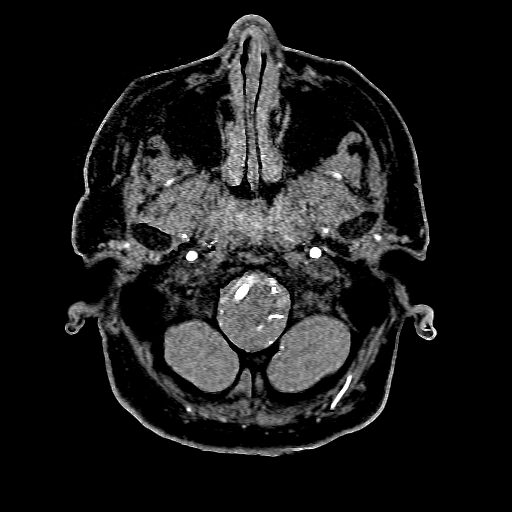
[im 12/184]
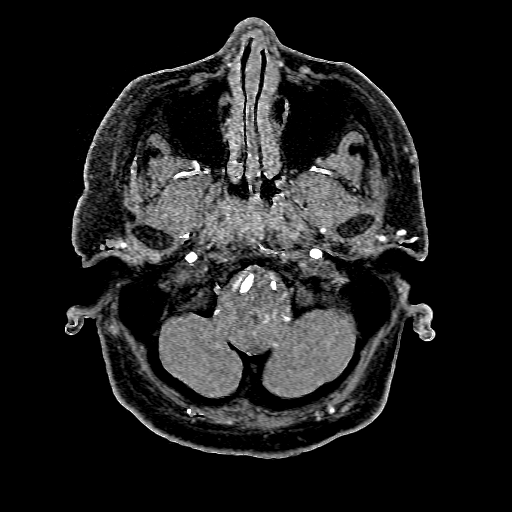
[im 32/184]
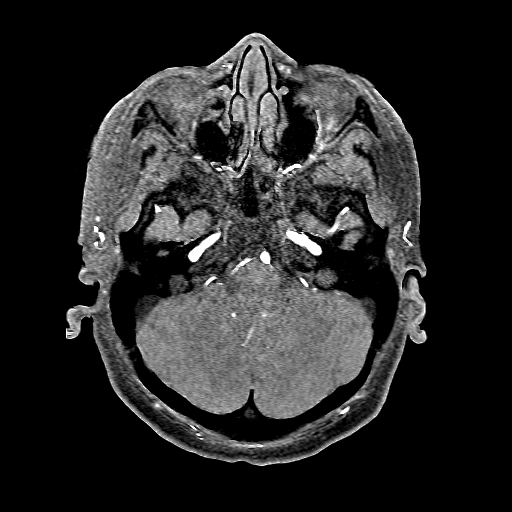
[im 36/184]
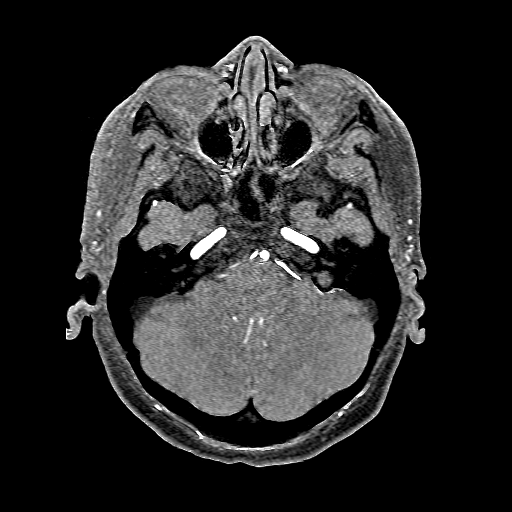
[im 59/184]
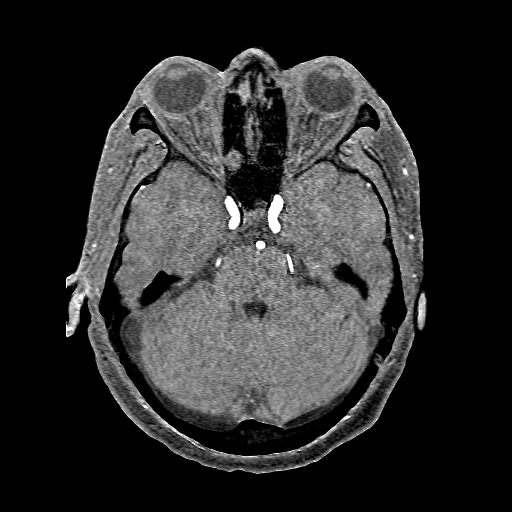
[im 82/184]
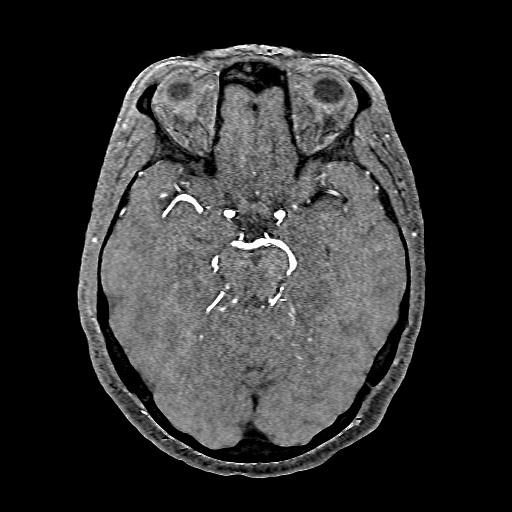
[im 94/184]
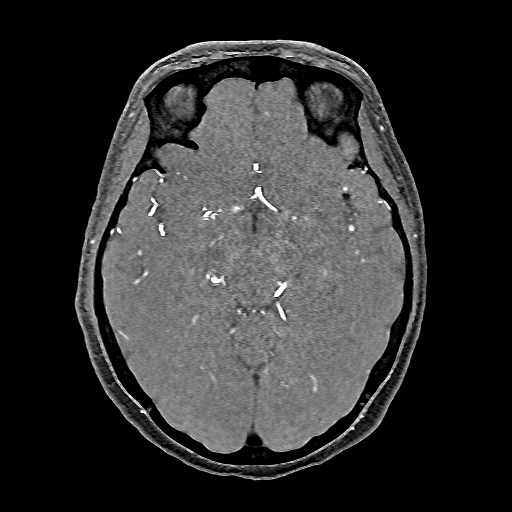
[im 106/184]
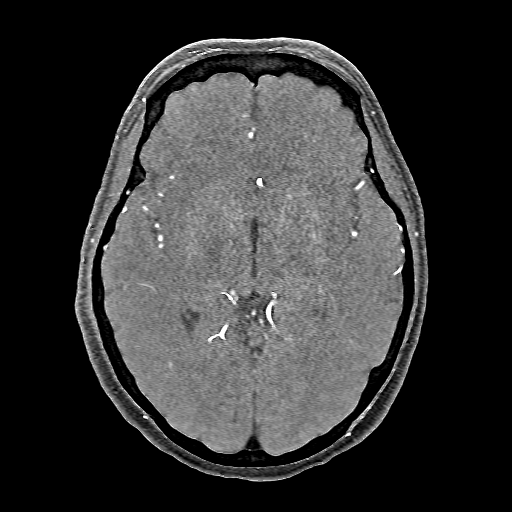
[im 129/184]
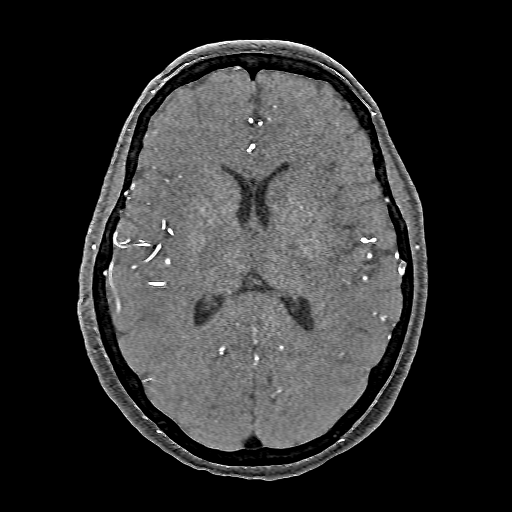
[im 152/184]
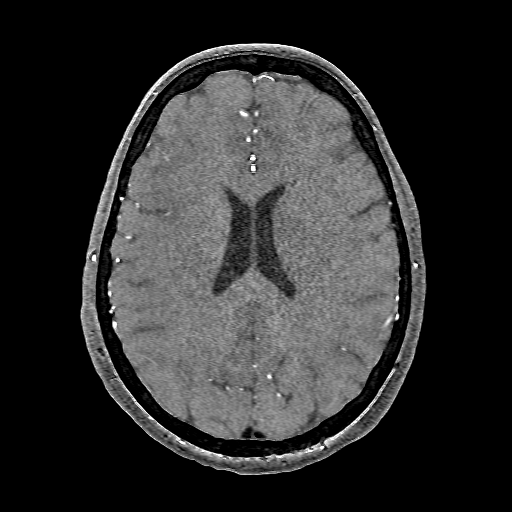
[im 156/184]
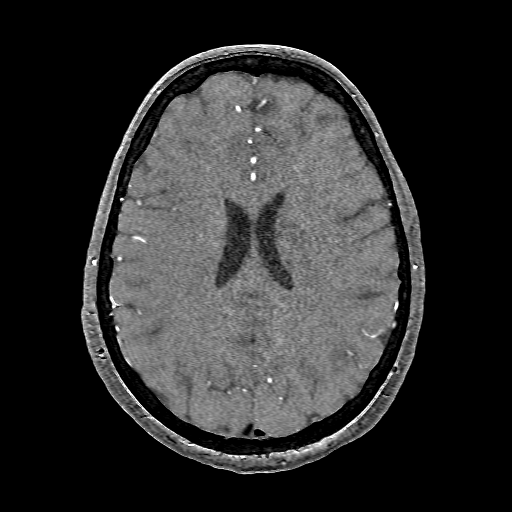
[im 176/184]
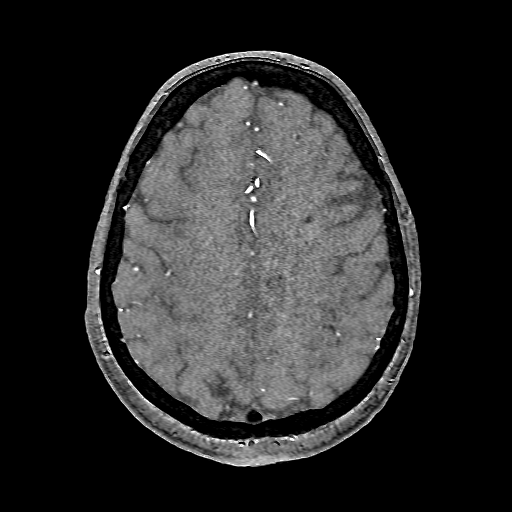

[14 of 48 positions shown; findings below may reference images not displayed]

FINDINGS: MRI HEAD FINDINGS

Brain: Left MCA territory restricted diffusion, most confluent at
the posterior left caudate and left lentiform, with additional
scattered small cortical and subcortical areas around the left
insula and left inferior frontal gyrus. This is similar to although
not exactly corresponding to the predicted core infarct on CTP (19
mL on that exam). Associated T2 and FLAIR hyperintensity with no
convincing hemorrhagic transformation. No mass effect.

No contralateral right hemisphere or posterior fossa restricted
diffusion. Scattered bilateral small and mostly subcortical white
matter T2 and FLAIR hyperintense foci. No chronic cortical
encephalomalacia identified, but there is evidence of a chronic left
corona radiata lacunar infarct. No midline shift, mass effect,
evidence of mass lesion, ventriculomegaly, extra-axial collection or
acute intracranial hemorrhage. Cervicomedullary junction and
pituitary are within normal limits.

Vascular: Major intracranial vascular flow voids are preserved.

Skull and upper cervical spine: Negative visible cervical spine.
Visualized bone marrow signal is within normal limits.

Sinuses/Orbits: Negative orbits. Posterior ethmoid and sphenoid
sinus mucosal thickening.

Other: Layering fluid in the pharynx. Mastoids remain clear. The
patient is intubated with an oral enteric tube also partially
visible.

MRA HEAD FINDINGS

Antegrade flow in the posterior circulation with dominant right
vertebral artery. Patent left vertebrobasilar junction. Normal right
PICA origin. Patent basilar artery. Patent AICA, SCA and PCA
origins. Both posterior communicating arteries are present.
Bilateral PCA branches are within normal limits.

Antegrade flow in both ICA siphons. No siphon stenosis identified.
Ophthalmic and posterior communicating artery origins appear normal.
Patent carotid termini. Patent MCA and ACA origins. The left ACA
appears dominant. And there is a 2 mm infundibulum versus aneurysm
directed superiorly from the anterior communicating artery region on
series 2, image 93. But otherwise the visible ACA branches are
within normal limits. Right MCA M1 segment, right MCA bifurcation
and visible right MCA branches are within normal limits.

The left M1 was stented. The left MCA origin is patent, with loss of
most of the left MCA flow signal, although there is flow signal
detected at the left MCA bifurcation. However, there is a paucity of
left MCA branches when compared to the right side. Furthermore,
there is asymmetric FLAIR signal within left MCA M2 and M3 branches
on the anatomic images above.
IMPRESSION: 1. The Left MCA was stented which is at least partially responsible
for the decreased left M1 flow signal. However, decreased
visualization of left MCA branches on MRA and increased branch FLAIR
signal raises the possibility of residual hemodynamically
significant MCA stenosis.

2. MRA is negative for other intracranial stenosis. Although there
is a 2 mm aneurysm versus infundibulum arising cephalad from the
A-comm.

3. MRI reveals patchy left MCA infarcts most confluent at the
posterior caudate and lentiform. Overall infarct volume probably
similar to that predicted on CTP yesterday.

4. No intracranial mass effect or hemorrhagic transformation.

5. Chronic left corona radiata infarct.

ADDENDUM:
Left MCA findings discussed by telephone with Dr. STEPHE on
[DATE] at [AR] hours. And we discussed that a repeat CTA may be
valuable to further quantify the Left MCA reperfusion.

*** End of Addendum ***
FINDINGS: MRI HEAD FINDINGS

Brain: Left MCA territory restricted diffusion, most confluent at
the posterior left caudate and left lentiform, with additional
scattered small cortical and subcortical areas around the left
insula and left inferior frontal gyrus. This is similar to although
not exactly corresponding to the predicted core infarct on CTP (19
mL on that exam). Associated T2 and FLAIR hyperintensity with no
convincing hemorrhagic transformation. No mass effect.

No contralateral right hemisphere or posterior fossa restricted
diffusion. Scattered bilateral small and mostly subcortical white
matter T2 and FLAIR hyperintense foci. No chronic cortical
encephalomalacia identified, but there is evidence of a chronic left
corona radiata lacunar infarct. No midline shift, mass effect,
evidence of mass lesion, ventriculomegaly, extra-axial collection or
acute intracranial hemorrhage. Cervicomedullary junction and
pituitary are within normal limits.

Vascular: Major intracranial vascular flow voids are preserved.

Skull and upper cervical spine: Negative visible cervical spine.
Visualized bone marrow signal is within normal limits.

Sinuses/Orbits: Negative orbits. Posterior ethmoid and sphenoid
sinus mucosal thickening.

Other: Layering fluid in the pharynx. Mastoids remain clear. The
patient is intubated with an oral enteric tube also partially
visible.

MRA HEAD FINDINGS

Antegrade flow in the posterior circulation with dominant right
vertebral artery. Patent left vertebrobasilar junction. Normal right
PICA origin. Patent basilar artery. Patent AICA, SCA and PCA
origins. Both posterior communicating arteries are present.
Bilateral PCA branches are within normal limits.

Antegrade flow in both ICA siphons. No siphon stenosis identified.
Ophthalmic and posterior communicating artery origins appear normal.
Patent carotid termini. Patent MCA and ACA origins. The left ACA
appears dominant. And there is a 2 mm infundibulum versus aneurysm
directed superiorly from the anterior communicating artery region on
series 2, image 93. But otherwise the visible ACA branches are
within normal limits. Right MCA M1 segment, right MCA bifurcation
and visible right MCA branches are within normal limits.

The left M1 was stented. The left MCA origin is patent, with loss of
most of the left MCA flow signal, although there is flow signal
detected at the left MCA bifurcation. However, there is a paucity of
left MCA branches when compared to the right side. Furthermore,
there is asymmetric FLAIR signal within left MCA M2 and M3 branches
on the anatomic images above.
IMPRESSION: 1. The Left MCA was stented which is at least partially responsible
for the decreased left M1 flow signal. However, decreased
visualization of left MCA branches on MRA and increased branch FLAIR
signal raises the possibility of residual hemodynamically
significant MCA stenosis.

2. MRA is negative for other intracranial stenosis. Although there
is a 2 mm aneurysm versus infundibulum arising cephalad from the
A-comm.

3. MRI reveals patchy left MCA infarcts most confluent at the
posterior caudate and lentiform. Overall infarct volume probably
similar to that predicted on CTP yesterday.

4. No intracranial mass effect or hemorrhagic transformation.

5. Chronic left corona radiata infarct.

## 2019-10-03 MED ORDER — ASPIRIN 81 MG PO CHEW
81.0000 mg | CHEWABLE_TABLET | Freq: Every day | ORAL | Status: DC
  Administered 2019-10-05 – 2019-10-06 (×2): 81 mg via ORAL
  Filled 2019-10-03 (×2): qty 1

## 2019-10-03 MED ORDER — IPRATROPIUM-ALBUTEROL 0.5-2.5 (3) MG/3ML IN SOLN: 3.0000 mL | RESPIRATORY_TRACT | Status: DC | PRN

## 2019-10-03 MED ORDER — IPRATROPIUM-ALBUTEROL 0.5-2.5 (3) MG/3ML IN SOLN
3.0000 mL | RESPIRATORY_TRACT | Status: DC
  Administered 2019-10-03 (×2): 3 mL via RESPIRATORY_TRACT
  Filled 2019-10-03 (×3): qty 3

## 2019-10-03 MED ORDER — PANTOPRAZOLE SODIUM 40 MG PO PACK
40.0000 mg | PACK | ORAL | Status: DC
  Administered 2019-10-03 – 2019-10-04 (×2): 40 mg
  Filled 2019-10-03 (×2): qty 20

## 2019-10-03 MED ORDER — ALBUTEROL SULFATE (2.5 MG/3ML) 0.083% IN NEBU: 2.5000 mg | INHALATION_SOLUTION | RESPIRATORY_TRACT | Status: DC | PRN

## 2019-10-03 MED ORDER — TICAGRELOR 90 MG PO TABS
90.0000 mg | ORAL_TABLET | Freq: Two times a day (BID) | ORAL | Status: DC
  Administered 2019-10-03 – 2019-10-04 (×3): 90 mg
  Filled 2019-10-03 (×3): qty 1

## 2019-10-03 MED ORDER — FENTANYL CITRATE (PF) 100 MCG/2ML IJ SOLN
INTRAMUSCULAR | Status: AC
  Filled 2019-10-03: qty 2

## 2019-10-03 MED ORDER — PROPOFOL 500 MG/50ML IV EMUL
INTRAVENOUS | Status: DC | PRN
  Administered 2019-10-03: 60 ug/kg/min via INTRAVENOUS
  Administered 2019-10-03: 25 ug/kg/min via INTRAVENOUS

## 2019-10-03 MED ORDER — LABETALOL HCL 5 MG/ML IV SOLN
INTRAVENOUS | Status: DC | PRN
  Administered 2019-10-03: 5 mg via INTRAVENOUS

## 2019-10-03 MED ORDER — ORAL CARE MOUTH RINSE
15.0000 mL | OROMUCOSAL | Status: DC
  Administered 2019-10-03 – 2019-10-04 (×13): 15 mL via OROMUCOSAL

## 2019-10-03 MED ORDER — ASPIRIN 81 MG PO CHEW
81.0000 mg | CHEWABLE_TABLET | Freq: Every day | ORAL | Status: DC
  Administered 2019-10-03 – 2019-10-04 (×2): 81 mg
  Filled 2019-10-03 (×2): qty 1

## 2019-10-03 MED ORDER — ACETAMINOPHEN 650 MG RE SUPP: 650.0000 mg | RECTAL | Status: DC | PRN

## 2019-10-03 MED ORDER — ATORVASTATIN CALCIUM 80 MG PO TABS
80.0000 mg | ORAL_TABLET | Freq: Every day | ORAL | Status: DC
  Filled 2019-10-03: qty 1

## 2019-10-03 MED ORDER — IOHEXOL 300 MG/ML  SOLN
150.0000 mL | Freq: Once | INTRAMUSCULAR | Status: AC | PRN
  Administered 2019-10-03: 50 mL via INTRA_ARTERIAL

## 2019-10-03 MED ORDER — ACETAMINOPHEN 325 MG PO TABS: 650.0000 mg | ORAL_TABLET | ORAL | Status: DC | PRN

## 2019-10-03 MED ORDER — TICAGRELOR 90 MG PO TABS
90.0000 mg | ORAL_TABLET | Freq: Two times a day (BID) | ORAL | Status: DC
  Administered 2019-10-04 – 2019-10-06 (×4): 90 mg via ORAL
  Filled 2019-10-03 (×4): qty 1

## 2019-10-03 MED ORDER — IOHEXOL 300 MG/ML  SOLN
150.0000 mL | Freq: Once | INTRAMUSCULAR | Status: AC | PRN
  Administered 2019-10-03: 75 mL via INTRA_ARTERIAL

## 2019-10-03 MED ORDER — SODIUM CHLORIDE 0.9 % IV SOLN: INTRAVENOUS | Status: DC

## 2019-10-03 MED ORDER — CHLORHEXIDINE GLUCONATE CLOTH 2 % EX PADS
6.0000 | MEDICATED_PAD | Freq: Every day | CUTANEOUS | Status: DC
  Administered 2019-10-03 – 2019-10-06 (×4): 6 via TOPICAL

## 2019-10-03 MED ORDER — ACETAMINOPHEN 160 MG/5ML PO SOLN
650.0000 mg | ORAL | Status: DC | PRN
  Administered 2019-10-03: 650 mg
  Filled 2019-10-03: qty 20.3

## 2019-10-03 MED ORDER — TIROFIBAN HCL IN NACL 5-0.9 MG/100ML-% IV SOLN
0.0750 ug/kg/min | INTRAVENOUS | Status: AC
  Administered 2019-10-03: 0.075 ug/kg/min via INTRAVENOUS
  Filled 2019-10-03: qty 100

## 2019-10-03 MED ORDER — ATORVASTATIN CALCIUM 80 MG PO TABS
80.0000 mg | ORAL_TABLET | Freq: Every day | ORAL | Status: DC
  Administered 2019-10-03 – 2019-10-04 (×2): 80 mg
  Filled 2019-10-03: qty 1

## 2019-10-03 MED ORDER — CHLORHEXIDINE GLUCONATE 0.12% ORAL RINSE (MEDLINE KIT)
15.0000 mL | Freq: Two times a day (BID) | OROMUCOSAL | Status: DC
  Administered 2019-10-03 – 2019-10-04 (×4): 15 mL via OROMUCOSAL

## 2019-10-03 MED ORDER — CLEVIDIPINE BUTYRATE 0.5 MG/ML IV EMUL
0.0000 mg/h | INTRAVENOUS | Status: DC
  Administered 2019-10-03: 2 mg/h via INTRAVENOUS
  Administered 2019-10-03: 15 mg/h via INTRAVENOUS
  Administered 2019-10-03: 18 mg/h via INTRAVENOUS
  Administered 2019-10-03: 13 mg/h via INTRAVENOUS
  Administered 2019-10-03: 11 mg/h via INTRAVENOUS
  Filled 2019-10-03 (×3): qty 50
  Filled 2019-10-03 (×2): qty 100
  Filled 2019-10-03: qty 50

## 2019-10-03 MED ORDER — BISACODYL 10 MG RE SUPP: 10.0000 mg | Freq: Every day | RECTAL | Status: DC | PRN

## 2019-10-03 MED ORDER — ESMOLOL HCL 100 MG/10ML IV SOLN
INTRAVENOUS | Status: DC | PRN
  Administered 2019-10-03: 20 mg via INTRAVENOUS

## 2019-10-03 MED ORDER — FENTANYL 2500MCG IN NS 250ML (10MCG/ML) PREMIX INFUSION
0.0000 ug/h | INTRAVENOUS | Status: DC
  Administered 2019-10-03: 25 ug/h via INTRAVENOUS
  Administered 2019-10-04: 100 ug/h via INTRAVENOUS
  Filled 2019-10-03 (×2): qty 250

## 2019-10-03 MED ORDER — PROPOFOL 1000 MG/100ML IV EMUL
5.0000 ug/kg/min | INTRAVENOUS | Status: DC
  Administered 2019-10-03: 50 ug/kg/min via INTRAVENOUS
  Administered 2019-10-03: 20 ug/kg/min via INTRAVENOUS
  Administered 2019-10-04: 30 ug/kg/min via INTRAVENOUS
  Filled 2019-10-03 (×5): qty 100

## 2019-10-03 MED ORDER — ONDANSETRON HCL 4 MG/2ML IJ SOLN
INTRAMUSCULAR | Status: DC | PRN
  Administered 2019-10-03: 4 mg via INTRAVENOUS

## 2019-10-03 NOTE — Progress Notes (Signed)
Attempted bilateral lower extremity venous duplex, however pt is having sheath removed and will need to be on bed rest. Will attempt again tomorrow 3/10 when patient is able to move extremities. This has been discussed with Dr. Roda Shutters.  10/03/2019 2:13 PM Gertie Fey, MHA, RVT, RDCS, RDMS

## 2019-10-03 NOTE — Transfer of Care (Signed)
Immediate Anesthesia Transfer of Care Note  Patient: Erin Peck  Procedure(s) Performed: IR WITH ANESTHESIA (N/A )  Patient Location: ICU  Anesthesia Type:General  Level of Consciousness: Patient remains intubated per anesthesia plan  Airway & Oxygen Therapy: Patient remains intubated per anesthesia plan and Patient placed on Ventilator (see vital sign flow sheet for setting)  Post-op Assessment: Report given to RN and Post -op Vital signs reviewed and stable  Post vital signs: Reviewed and stable  Last Vitals:  Vitals Value Taken Time  BP 113/61 10/03/19 0107  Temp 36.3 C 10/03/19 0104  Pulse 89 10/03/19 0108  Resp 15 10/03/19 0108  SpO2 100 % 10/03/19 0108  Vitals shown include unvalidated device data.  Last Pain:  Vitals:   10/03/19 0104  TempSrc: Axillary     Report to [facility], RT at bedside, applied to ventilator, VSS. Questions answered. Transfer of care in stable condition.    Complications: No apparent anesthesia complications

## 2019-10-03 NOTE — Progress Notes (Signed)
STROKE TEAM PROGRESS NOTE   INTERVAL HISTORY RN at bedside. Pt still intubated but on weaning. Still has sheath at right groin. Plan to remove today. She is on cleviprex and propofol. On aggrestat now, will stop soon, also on ASA and brilinta. Pt was able to open eyes on voice and following all simple commands, but still has left gaze preference and right hemiparesis.   Vitals:   10/03/19 0700 10/03/19 0730 10/03/19 0732 10/03/19 0800  BP: 116/68 108/66 (!) 157/74 106/67  Pulse: 81 79 90 (!) 102  Resp: 19 19 11 15   Temp:    (!) 97 F (36.1 C)  TempSrc:    Axillary  SpO2: 97% 99% 100% 100%  Weight:      Height:        CBC:  Recent Labs  Lab 10/02/19 1953 10/02/19 1953 10/03/19 0212 10/03/19 0231  WBC 14.1*  --  11.4*  --   NEUTROABS 10.2*  --  9.4*  --   HGB 14.7   < > 13.8 13.6  HCT 45.3   < > 40.9 40.0  MCV 94.2  --  92.1  --   PLT 366  --  356  --    < > = values in this interval not displayed.    Basic Metabolic Panel:  Recent Labs  Lab 10/02/19 1953 10/02/19 1953 10/03/19 0212 10/03/19 0231  NA 140   < > 139 139  K 4.3   < > 4.1 3.8  CL 107  --  108  --   CO2 25  --  22  --   GLUCOSE 113*  --  171*  --   BUN 13  --  9  --   CREATININE 0.88  --  0.79  --   CALCIUM 9.2  --  8.9  --    < > = values in this interval not displayed.   Lipid Panel:     Component Value Date/Time   CHOL 236 (H) 10/03/2019 0212   TRIG 187 (H) 10/03/2019 0555   HDL 49 10/03/2019 0212   CHOLHDL 4.8 10/03/2019 0212   VLDL 35 10/03/2019 0212   LDLCALC 152 (H) 10/03/2019 0212   HgbA1c:  Lab Results  Component Value Date   HGBA1C 5.3 10/03/2019   Urine Drug Screen: No results found for: LABOPIA, COCAINSCRNUR, LABBENZ, AMPHETMU, THCU, LABBARB  Alcohol Level     Component Value Date/Time   ETH <10 10/02/2019 1953    IMAGING past 24 hours CT ANGIO HEAD W OR WO CONTRAST  Result Date: 10/02/2019 CLINICAL DATA:  Initial evaluation for acute headache, right-sided weakness.  EXAM: CT ANGIOGRAPHY HEAD AND NECK CT PERFUSION BRAIN TECHNIQUE: Multidetector CT imaging of the head and neck was performed using the standard protocol during bolus administration of intravenous contrast. Multiplanar CT image reconstructions and MIPs were obtained to evaluate the vascular anatomy. Carotid stenosis measurements (when applicable) are obtained utilizing NASCET criteria, using the distal internal carotid diameter as the denominator. Multiphase CT imaging of the brain was performed following IV bolus contrast injection. Subsequent parametric perfusion maps were calculated using RAPID software. CONTRAST:  12/02/2019 OMNIPAQUE IOHEXOL 350 MG/ML SOLN COMPARISON:  Prior head CT from earlier same day. FINDINGS: CTA NECK FINDINGS Aortic arch: Visualized aortic arch normal in caliber. Bovine arch with common origin of the right brachiocephalic and left common carotid artery noted. Mild-to-moderate atheromatous plaque about the arch and origin of the great vessels without hemodynamically significant stenosis. Visualized subclavian arteries widely  patent. Right carotid system: Right common carotid artery patent from its origin to the bifurcation without stenosis. Scattered eccentric calcified plaque about the right bifurcation/proximal right ICA with stenosis of up to approximately 50% by NASCET criteria. Right ICA otherwise widely patent distally to the skull base without stenosis, dissection, or occlusion. Left carotid system: Calcified plaque at the origin of the left CCA without high-grade stenosis. Left CCA otherwise patent to the bifurcation without stenosis. Mild scattered calcified plaque about the left bifurcation/proximal left ICA without significant stenosis. Left ICA widely patent distally to the skull base without stenosis, dissection or occlusion. Vertebral arteries: Both vertebral arteries arise from the subclavian arteries. Right vertebral artery dominant with a diffusely hypoplastic left vertebral  artery. Focal atheromatous plaque at the origin of the right vertebral artery with approximate 50% stenosis. Right vertebral artery otherwise widely patent within the neck. 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 Distal reconstitution via muscular branches at the left V3 segment (series 7, image 158). Left vertebral is patent as it courses into the cranial vault. Skeleton: No acute osseous abnormality. No discrete or worrisome osseous lesions. Other neck: No other acute soft tissue abnormality within the neck. No adenopathy. Subcentimeter hypodense nodule noted within the right thyroid lobe, of doubtful significance given size and patient age. No follow-up imaging recommended regarding this lesion. Upper chest: Visualized upper chest demonstrates no acute finding. Partially visualized lungs are clear. Paraseptal and centrilobular emphysematous changes noted at the lung apices. Review of the MIP images confirms the above findings CTA HEAD FINDINGS Anterior circulation: Petrous segments widely patent bilaterally. Cavernous and supraclinoid ICAs patent without flow-limiting stenosis. A1 segments patent bilaterally. Right A1 hypoplastic, accounting for the slightly diminutive right ICA is compared to the left. Normal anterior communicating artery. Anterior cerebral arteries widely patent to their distal aspects. Right M1 widely patent. Normal right MCA bifurcation. Distal right MCA branches well perfused. Left M1 patent proximally. There is occlusive thrombus within the distal left M1 segment/left MCA bifurcation (series 7, image 94). Fairly good perfusion seen within the left MCA branches distally, which could be related to subocclusive thrombus and/or collateralization. Posterior circulation: Vertebral arteries patent to the vertebrobasilar junction without stenosis. Neither PICA of visualized. Basilar widely patent to its distal aspect without stenosis. Superior cerebral arteries patent bilaterally.  Both PCAs primarily supplied via the basilar and are well perfused or distal aspects. Small hypoplastic posterior communicating arteries noted bilaterally. Venous sinuses: Patent. Anatomic variants: None significant.  No aneurysm. Review of the MIP images confirms the above findings CT Brain Perfusion Findings: ASPECTS: 10 CBF (<30%) Volume: 52mL Perfusion (Tmax>6.0s) volume: 77mL Mismatch Volume: 69mL Infarction Location:Patchy acute core infarct seen involving the cortical and subcortical aspect of the mid and posterior left frontal region. Moderate sized surrounding ischemic penumbra. IMPRESSION: CTA HEAD AND NECK IMPRESSION: 1. Positive CTA for emergent large vessel occlusion, with occlusive thrombus involving the distal left M1 segment/left MCA bifurcation. Fairly robust perfusion within the distal left MCA branches distally, could be related to subocclusive thrombus and/or collateralization. 2. 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 3. 50% stenosis at the origin of the dominant right vertebral artery. 4. Hypoplastic left vertebral artery, occluded within the neck, with distal reconstitution by the skull base. 5.  Emphysema (ICD10-J43.9). CT PERFUSION IMPRESSION: 19 cc acute core infarct involving the mid and posterior left frontal region, left MCA distribution. 94 cc surrounding ischemic penumbra, mismatch volume 75 cc. Critical Value/emergent results were called by telephone at the time  of interpretation on 10/02/2019 at 9:05 pm to provider Dr. Deretha Emory, who verbally acknowledged these results. Electronically Signed   By: Rise Mu M.D.   On: 10/02/2019 21:31   CT ANGIO NECK W OR WO CONTRAST  Result Date: 10/02/2019 CLINICAL DATA:  Initial evaluation for acute headache, right-sided weakness. EXAM: CT ANGIOGRAPHY HEAD AND NECK CT PERFUSION BRAIN TECHNIQUE: Multidetector CT imaging of the head and neck was performed using the standard protocol during bolus administration of  intravenous contrast. Multiplanar CT image reconstructions and MIPs were obtained to evaluate the vascular anatomy. Carotid stenosis measurements (when applicable) are obtained utilizing NASCET criteria, using the distal internal carotid diameter as the denominator. Multiphase CT imaging of the brain was performed following IV bolus contrast injection. Subsequent parametric perfusion maps were calculated using RAPID software. CONTRAST:  OMNIPAQUE IOHEXOL 350 MG/ML SOLN COMPARISON:  Prior head CT from earlier same day. FINDINGS: CTA NECK FINDINGS Aortic arch: Visualized aortic arch normal in caliber. Bovine arch with common origin of the right brachiocephalic and left common carotid artery noted. Mild-to-moderate atheromatous plaque about the arch and origin of the great vessels without hemodynamically significant stenosis. Visualized subclavian arteries widely patent. Right carotid system: Right common carotid artery patent from its origin to the bifurcation without stenosis. Scattered eccentric calcified plaque about the right bifurcation/proximal right ICA with stenosis of up to approximately 50% by NASCET criteria. Right ICA otherwise widely patent distally to the skull base without stenosis, dissection, or occlusion. Left carotid system: Calcified plaque at the origin of the left CCA without high-grade stenosis. Left CCA otherwise patent to the bifurcation without stenosis. Mild scattered calcified plaque about the left bifurcation/proximal left ICA without significant stenosis. Left ICA widely patent distally to the skull base without stenosis, dissection or occlusion. Vertebral arteries: Both vertebral arteries arise from the subclavian arteries. Right vertebral artery dominant with a diffusely hypoplastic left vertebral artery. Focal atheromatous plaque at the origin of the right vertebral artery with approximate 50% stenosis. Right vertebral artery otherwise widely patent within the neck. Hypoplastic  left vertebral artery occluded at its origin. Distal reconstitution via muscular branches at the left V3 segment (series 7, image 158). Left vertebral is patent as it courses into the cranial vault. Skeleton: No acute osseous abnormality. No discrete or worrisome osseous lesions. Other neck: No other acute soft tissue abnormality within the neck. No adenopathy. Subcentimeter hypodense nodule noted within the right thyroid lobe, of doubtful significance given size and patient age. No follow-up imaging recommended regarding this lesion. Upper chest: Visualized upper chest demonstrates no acute finding. Partially visualized lungs are clear. Paraseptal and centrilobular emphysematous changes noted at the lung apices. Review of the MIP images confirms the above findings CTA HEAD FINDINGS Anterior circulation: Petrous segments widely patent bilaterally. Cavernous and supraclinoid ICAs patent without flow-limiting stenosis. A1 segments patent bilaterally. Right A1 hypoplastic, accounting for the slightly diminutive right ICA is compared to the left. Normal anterior communicating artery. Anterior cerebral arteries widely patent to their distal aspects. Right M1 widely patent. Normal right MCA bifurcation. Distal right MCA branches well perfused. Left M1 patent proximally. There is occlusive thrombus within the distal left M1 segment/left MCA bifurcation (series 7, image 94). Fairly good perfusion seen within the left MCA branches distally, which could be related to subocclusive thrombus and/or collateralization. Posterior circulation: Vertebral arteries patent to the vertebrobasilar junction without stenosis. Neither PICA of visualized. Basilar widely patent to its distal aspect without stenosis. Superior cerebral arteries patent bilaterally. Both PCAs primarily  supplied via the basilar and are well perfused or distal aspects. Small hypoplastic posterior communicating arteries noted bilaterally. Venous sinuses: Patent.  Anatomic variants: None significant.  No aneurysm. Review of the MIP images confirms the above findings CT Brain Perfusion Findings: ASPECTS: 10 CBF (<30%) Volume: 19mL Perfusion (Tmax>6.0s) volume: 94mL Mismatch Volume: 75mL Infarction Location:Patchy acute core infarct seen involving the cortical and subcortical aspect of the mid and posterior left frontal region. Moderate sized surrounding ischemic penumbra. IMPRESSION: CTA HEAD AND NECK IMPRESSION: 1. Positive CTA for emergent large vessel occlusion, with occlusive thrombus involving the distal left M1 segment/left MCA bifurcation. Fairly robust perfusion within the distal left MCA branches distally, could be related to subocclusive thrombus and/or collateralization. 2. 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 3. 50% stenosis at the origin of the dominant right vertebral artery. 4. Hypoplastic left vertebral artery, occluded within the neck, with distal reconstitution by the skull base. 5.  Emphysema (ICD10-J43.9). CT PERFUSION IMPRESSION: 19 cc acute core infarct involving the mid and posterior left frontal region, left MCA distribution. 94 cc surrounding ischemic penumbra, mismatch volume 75 cc. Critical Value/emergent results were called by telephone at the time of interpretation on 10/02/2019 at 9:05 pm to provider Dr. Deretha EmoryZackowski, who verbally acknowledged these results. Electronically Signed   By: Rise MuBenjamin  McClintock M.D.   On: 10/02/2019 21:31   CT CEREBRAL PERFUSION W CONTRAST  Result Date: 10/02/2019 CLINICAL DATA:  Initial evaluation for acute headache, right-sided weakness. EXAM: CT ANGIOGRAPHY HEAD AND NECK CT PERFUSION BRAIN TECHNIQUE: Multidetector CT imaging of the head and neck was performed using the standard protocol during bolus administration of intravenous contrast. Multiplanar CT image reconstructions and MIPs were obtained to evaluate the vascular anatomy. Carotid stenosis measurements (when applicable) are obtained  utilizing NASCET criteria, using the distal internal carotid diameter as the denominator. Multiphase CT imaging of the brain was performed following IV bolus contrast injection. Subsequent parametric perfusion maps were calculated using RAPID software. CONTRAST:  115mL OMNIPAQUE IOHEXOL 350 MG/ML SOLN COMPARISON:  Prior head CT from earlier same day. FINDINGS: CTA NECK FINDINGS Aortic arch: Visualized aortic arch normal in caliber. Bovine arch with common origin of the right brachiocephalic and left common carotid artery noted. Mild-to-moderate atheromatous plaque about the arch and origin of the great vessels without hemodynamically significant stenosis. Visualized subclavian arteries widely patent. Right carotid system: Right common carotid artery patent from its origin to the bifurcation without stenosis. Scattered eccentric calcified plaque about the right bifurcation/proximal right ICA with stenosis of up to approximately 50% by NASCET criteria. Right ICA otherwise widely patent distally to the skull base without stenosis, dissection, or occlusion. Left carotid system: Calcified plaque at the origin of the left CCA without high-grade stenosis. Left CCA otherwise patent to the bifurcation without stenosis. Mild scattered calcified plaque about the left bifurcation/proximal left ICA without significant stenosis. Left ICA widely patent distally to the skull base without stenosis, dissection or occlusion. Vertebral arteries: Both vertebral arteries arise from the subclavian arteries. Right vertebral artery dominant with a diffusely hypoplastic left vertebral artery. Focal atheromatous plaque at the origin of the right vertebral artery with approximate 50% stenosis. Right vertebral artery otherwise widely patent within the neck. 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 Distal reconstitution via muscular branches at the left V3 segment (series 7, image 158). Left vertebral is patent as it courses  into the cranial vault. Skeleton: No acute osseous abnormality. No discrete or worrisome osseous lesions. Other neck: No other acute  soft tissue abnormality within the neck. No adenopathy. Subcentimeter hypodense nodule noted within the right thyroid lobe, of doubtful significance given size and patient age. No follow-up imaging recommended regarding this lesion. Upper chest: Visualized upper chest demonstrates no acute finding. Partially visualized lungs are clear. Paraseptal and centrilobular emphysematous changes noted at the lung apices. Review of the MIP images confirms the above findings CTA HEAD FINDINGS Anterior circulation: Petrous segments widely patent bilaterally. Cavernous and supraclinoid ICAs patent without flow-limiting stenosis. A1 segments patent bilaterally. Right A1 hypoplastic, accounting for the slightly diminutive right ICA is compared to the left. Normal anterior communicating artery. Anterior cerebral arteries widely patent to their distal aspects. Right M1 widely patent. Normal right MCA bifurcation. Distal right MCA branches well perfused. Left M1 patent proximally. There is occlusive thrombus within the distal left M1 segment/left MCA bifurcation (series 7, image 94). Fairly good perfusion seen within the left MCA branches distally, which could be related to subocclusive thrombus and/or collateralization. Posterior circulation: Vertebral arteries patent to the vertebrobasilar junction without stenosis. Neither PICA of visualized. Basilar widely patent to its distal aspect without stenosis. Superior cerebral arteries patent bilaterally. Both PCAs primarily supplied via the basilar and are well perfused or distal aspects. Small hypoplastic posterior communicating arteries noted bilaterally. Venous sinuses: Patent. Anatomic variants: None significant.  No aneurysm. Review of the MIP images confirms the above findings CT Brain Perfusion Findings: ASPECTS: 10 CBF (<30%) Volume: 19mL Perfusion  (Tmax>6.0s) volume: 94mL Mismatch Volume: 75mL Infarction Location:Patchy acute core infarct seen involving the cortical and subcortical aspect of the mid and posterior left frontal region. Moderate sized surrounding ischemic penumbra. IMPRESSION: CTA HEAD AND NECK IMPRESSION: 1. Positive CTA for emergent large vessel occlusion, with occlusive thrombus involving the distal left M1 segment/left MCA bifurcation. Fairly robust perfusion within the distal left MCA branches distally, could be related to subocclusive thrombus and/or collateralization. 2. 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 3. 50% stenosis at the origin of the dominant right vertebral artery. 4. Hypoplastic left vertebral artery, occluded within the neck, with distal reconstitution by the skull base. 5.  Emphysema (ICD10-J43.9). CT PERFUSION IMPRESSION: 19 cc acute core infarct involving the mid and posterior left frontal region, left MCA distribution. 94 cc surrounding ischemic penumbra, mismatch volume 75 cc. Critical Value/emergent results were called by telephone at the time of interpretation on 10/02/2019 at 9:05 pm to provider Dr. Deretha Emory, who verbally acknowledged these results. Electronically Signed   By: Rise Mu M.D.   On: 10/02/2019 21:31   DG Chest Port 1 View  Result Date: 10/03/2019 CLINICAL DATA:  54 year old female status post endovascular treatment of left MCA M1 emergent large vessel occlusion. EXAM: PORTABLE CHEST 1 VIEW COMPARISON:  CTA head and neck earlier tonight. FINDINGS: Portable AP supine view at 0141 hours. Endotracheal tube tip in good position between the level the clavicles and carina. Enteric tube courses to the stomach but the side hole is at the level of the distal esophagus. Normal cardiac size and mediastinal contours. Allowing for portable technique the lungs are clear. No acute osseous abnormality identified. Negative visible bowel gas pattern. IMPRESSION: 1. Advance the enteric  tube 5 cm to ensure side hole placement within the stomach. 2. Endotracheal tube tip in good position. 3.  No acute cardiopulmonary abnormality. Electronically Signed   By: Odessa Fleming M.D.   On: 10/03/2019 01:57   DG Abd Portable 1V  Result Date: 10/03/2019 CLINICAL DATA:  NG tube placement. EXAM: PORTABLE ABDOMEN -  1 VIEW COMPARISON:  Chest x-ray, same date. FINDINGS: The NG tube is in good position with its tip in the body region the stomach laterally. The proximal port is now well below the GE junction. Contrast noted in both renal collecting systems and ureters. There is also some contrast surrounding the right kidney suggesting a urinary leak or obstruction. Recommend clinical correlation. IMPRESSION: 1. NG tube in good position. 2. Contrast surrounding the right kidney suggesting a urinary leak or obstruction. Recommend clinical correlation. CT abdomen/pelvis may be helpful for further evaluation. Electronically Signed   By: Rudie Meyer M.D.   On: 10/03/2019 07:07   CT HEAD CODE STROKE WO CONTRAST  Result Date: 10/02/2019 CLINICAL DATA:  Code stroke. 54 year old female with severe headache and right side weakness since 1750 hours. EXAM: CT HEAD WITHOUT CONTRAST TECHNIQUE: Contiguous axial images were obtained from the base of the skull through the vertex without intravenous contrast. COMPARISON:  None. FINDINGS: Brain: No midline shift, mass effect, or evidence of intracranial mass lesion. No ventriculomegaly. No acute intracranial hemorrhage identified. No cortically based acute infarct identified. Chronic appearing white matter hypodensity at the anterior left corona radiata on series 2, image 16. Elsewhere gray-white matter differentiation is within normal limits. Vascular: No suspicious intracranial vascular hyperdensity. Skull: Negative. Sinuses/Orbits: Clear aside from some right posterior ethmoid air cell mucosal thickening. Mastoids and tympanic cavities appear clear. Other: Visualized orbits and  scalp soft tissues are within normal limits. ASPECTS [facility] Stroke Program Early CT Score) Total score (0-10 with 10 being normal): 10 IMPRESSION: 1. No acute cortically based infarct or acute intracranial hemorrhage identified. ASPECTS 10. 2. 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 Study discussed by telephone with Dr. Vanetta Mulders on 10/02/2019 at 20:18 . Electronically Signed   By: Odessa Fleming M.D.   On: 10/02/2019 20:20   Cerebral Angio S/P Lt common carotid arteriogram followed by revascularization of occluded Lt MCA M1 occlusion with x 2 passes with 3mm x 40 mm solitaire X retriever and x 1 pass with solitaire X 6 mm x 40 mm retriever and penumbra aspiration achieving a TICI 2C revascularization with rescue stenting of reocclusion  With Y stenting  Into the sup and inf divisions achievin a TICI 2C  revascularization, Occlusion of  of sup division stent with slow revascularization following use of 6 mg of IA integrelin.Marland Kitchen  PHYSICAL EXAM  Temp:  [97 F (36.1 C)-98.5 F (36.9 C)] 97 F (36.1 C) (03/09 0800) Pulse Rate:  [74-114] 92 (03/09 0900) Resp:  [10-30] 10 (03/09 0900) BP: (94-165)/(57-131) 102/59 (03/09 0900) SpO2:  [88 %-100 %] 99 % (03/09 0900) Arterial Line BP: (116-217)/(55-110) 120/55 (03/09 0900) FiO2 (%):  [40 %] 40 % (03/09 0732) Weight:  [60 kg-74.1 kg] 74.1 kg (03/09 0130)  General - Well nourished, well developed, intubated on sedation.  Ophthalmologic - fundi not visualized due to noncooperation.  Cardiovascular - Regular rate and rhythm.  Neuro - intubated on sedation, eyes close but easily open on voice, following simple commands. With eye opening, eyes in left gaze position, barely cross midline, blinking to visual threat on the left but not on the right, doll's eyes present, not tracking on the right, PERRL. Corneal reflex present, gag and cough present. Breathing over the vent, on weaning.  Facial  symmetry not able to test due to ET tube.  Tongue protrusion not cooperative. Spontaneous moving LUE able to hold against gravity without drift, RUE 3/5  barely against gravity. LLE at least 3-/5 proximal and 4/5 distally. RLE not able to test proximal strength due to groin sheath, distally 2+/5 foot DF/PF. DTR 1+ and no babinski. Sensation, coordination and gait not tested.    ASSESSMENT/PLAN Erin Peck is a 54 y.o. female with history of complex migraines who does not often see a doctor presenting to [facility] ED with R sided hemiparesis and speech deficits. Received tPA 10/03/2019 at 2016 at [facility]. Transferred to [facility] for IR.   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 Chronic lacune L corona radiata. ASPECTS 10.     CTA head & neck ELVO L M1/L MCA bifurcation. Proximal R ICA 50% stenosis. R VA origin 50% stenosis. L VA hypoplastic/occlusion. emphysema  CT perfusion positive penumbra. Mismatch 75cc.  Cerebral angio L M1 occlusion w/ TICI2c revascularization w/ rescue Y stenting into superior and inferior divisions; superior division stent occlusion w/ revascularization using integrelin. Later treated w/ Aggrastat.   MRI  pending  MRA  pending  LE Doppler  pending  2D Echo pending  Plan TEE in a couple of days once stable  LDL 152  HgbA1c 5.3  SCDs for VTE prophylaxis  No antithrombotic prior to admission, now on aspirin 81 mg daily and Brilinta (ticagrelor) 90 mg bid. Continue DAPT at d/c. Off aggrestat IV  Therapy recommendations:  pending   Disposition:  pending   Acute Respiratory Failure  Intubated for IR, left intubated following IR  On sedation   On weaning   Hope for extubation today  CCM on board  Hypertensive Emergency  Home meds:  No hx HTN, not on home meds  BP as high  as 217/110 on arrival . BP goal 120-140 following IR procedure  . Long-term BP goal normotensive  Hyperlipidemia  Home meds:  No statin  LDL 152, goal < 70  lipitor 80 added  Continue statin at discharge  Dysphagia . Secondary to stroke . NPO . Swallow eval once extubated and sheath off . Speech on board  Other Stroke Risk Factors  Family hx stroke (mother and father, both in their 65s)  Hx complex Migraines  Other Active Problems  Leukocytosis 14.1->11.4, afebrile   Hospital day # 1  This patient is critically ill due to stroke, MCA occlusion, s/p tPA and IR and at significant risk of neurological worsening, death form recurrent stroke, hemorrhagic conversion, cerebral edema, seizure. This patient's care requires constant monitoring of vital signs, hemodynamics, respiratory and cardiac monitoring, review of multiple databases, neurological assessment, discussion with family, other specialists and medical decision making of high complexity. I spent 35 minutes of neurocritical care time in the care of this patient.  Marvel Plan, MD PhD Stroke Neurology 10/03/2019 10:22 AM  To contact Stroke Continuity provider, please refer to [email or web address]. After hours, contact General Neurology

## 2019-10-03 NOTE — Procedures (Signed)
Right 8 french sheath removal, no hematoma present prior to sheath removal.  Held manual pressure with a Quikclot pad for 25 minutes.  No complications and distal pulses still present.  Verified site with RN and gave instructions on when to remove Quikclot pad.    Lynann Beaver RT R, VI Brandy Mullis RT R

## 2019-10-03 NOTE — Progress Notes (Signed)
SLP Cancellation Note  Patient Details Name: JIM PHILEMON MRN: [id number] DOB: June 18, 1966   Cancelled treatment:       Reason Eval/Treat Not Completed: Patient not medically ready, remains on vent. Will follow up.    Mahala Menghini., M.A. CCC-SLP Acute Rehabilitation Services Pager [phone] Office [phone]  10/03/2019, 8:13 AM

## 2019-10-03 NOTE — Consult Note (Signed)
NAME:  Erin Peck, MRN:  [id number], DOB:  14-Aug-1965, LOS: 1 ADMISSION DATE:  10/02/2019, CONSULTATION DATE: 10/03/2019 REFERRING MD:  Dr. 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   History of present illness   HPI obtained from medical chart review as patient is intubated and sedated on mechanical ventilation.     54 year old female with no known medical history, noted for complicated migraine with slurred speech in 2019 who presenting to [facility] ER as code stroke with EMS with right facial drop and right hemiplegia with last known well 1750 on 3/8.  Patient reported in her normal state of health prior to.  CT head negative for acute hemorrhage. CTA head and neck showed left M1 occlusion.  Teleneurology consult recommended tPA in which was started 2016.  She was then transferred to [facility] and taken for emergent mechanical embolectomy with successful TICI 2C revascularization achieved after 3 passes requiring rescue stenting of reocclusion and IA integrelin and stenting into the superior and inferior divisions with TICI 2C revascularization.  Post procedure CT showing contrast stain in the basal ganglia without mass effect or gross hemorrhage.  Sheath left in place and returns to ICU sedated and intubated on mechanical ventilation in which PCCM was consulted for further management.   Past Medical History  No known Tobacco abuse  Chart review shows complicated migraine with slurred speech 2019  Significant Hospital Events   3/8 Admitted/ tPA/ mechanical thrombectomy   Consults:  Neuro IR  PCCM  Procedures:  3/8 ETT >> 3/8 left radial aline >> 3/8 right femoral sheath >>  Significant Diagnostic Tests:  3/8 CTH >> 1. No acute cortically based infarct or acute intracranial hemorrhage identified. ASPECTS 10. 2. Evidence of small vessel disease  in the left corona radiata, age indeterminate although favor chronic.  3/8 CTA head/ neck  >> 1. Positive CTA for emergent large vessel occlusion, with occlusive thrombus involving the distal left M1 segment/left MCA bifurcation. Fairly robust perfusion within the distal left MCA branches distally, could be related to subocclusive thrombus and/or collateralization. 2. 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 3. 50% stenosis at the origin of the dominant right vertebral artery. 4. Hypoplastic left vertebral artery, occluded within the neck, with distal reconstitution by the skull base. 5.  Emphysema   3/8 CT perfusion study >> 19 cc acute core infarct involving the mid and posterior left frontal region, left MCA distribution. 94 cc surrounding ischemic penumbra, mismatch volume 75 cc.  Micro Data:  3/8 SARS 2 / Flu A/ B >> neg  Antimicrobials:  3/8 cefazolin pre op  Interim history/subjective:  Arrived on propofol 50 mcg/kg/min and neo.  Since requiring cleviprex to achieve SBP goal   Objective   Blood pressure 106/71, pulse (!) 107, temperature 98.5 F (36.9 C), resp. rate (!) 22, height 5\' 5"  (1.651 m), weight 60 kg, SpO2 95 %.        Intake/Output Summary (Last 24 hours) at 10/03/2019 0059 Last data filed at 10/03/2019 0005 Gross per 24 hour  Intake 1000 ml  Output 25 ml  Net 975 ml   Filed Weights   10/02/19 1952  Weight: 60 kg    Examination: General:  Adult female sedated on mechanical ventilation  HEENT: MM pink/moist, pupils 3/reactive, anicteric, 7.5 ETT at 20.5, OGT Neuro: sedated CV: ST,  NSR, no obvious murmur PULM:  MV supported, diffusely coarse/ exp wheeze GI: soft, +bs, no foley   Extremities: warm/dry, no LE edema  Skin: no rashes   Resolved Hospital Problem list    Assessment & Plan:   Left MCA M1 CVA s/p tPA and mechanical thrombectomy requiring rescue stenting P:  Tele monitoring, NSR  Serial neuro exams  Continue ASA and  Brilinta per OGT  Cleviprex for SBP goal 120-140 F/u CTH and MRI ordered TTE in am  Further stroke labs pending- lipid panel, TSH  PT/ OT/ SLP when appropriate  NS at 75 ml/hr Trend CBC/ BMET Monitor for urinary retention    Acute respiratory insufficiency in the post procedure setting Tobacco abuse  -  Emphysematous changes noted on CT head/ neck P:  Full MV support, PRVC 6 cc/kg, rate 20 CXR and ABG now VAP measures/ PPI  Hold SBT/ WUA till sheath out duonebs q 4 for now, albuterol prn  PAD protocol with propofol and prn fentanyl for RASS goal -1 /-2 with bowel regimen  Leukocytosis  - afebrile, likely reactive P:  Monitor clinically, trend WBC/ fever curve Pending UA/ CXR   Best practice:  Diet: NPO Pain/Anxiety/Delirium protocol (if indicated): PAD protocol, RASS goal -1/ -2 VAP protocol (if indicated): yes DVT prophylaxis: s/p tPA, hold  GI prophylaxis: PPI Glucose control: monitor, if > 180 start SSI Mobility: BR Code Status: full  Family Communication: per primary  Disposition: Neuro ICU  Labs   CBC: Recent Labs  Lab 10/02/19 1953  WBC 14.1*  NEUTROABS 10.2*  HGB 14.7  HCT 45.3  MCV 94.2  PLT 366    Basic Metabolic Panel: Recent Labs  Lab 10/02/19 1953  NA 140  K 4.3  CL 107  CO2 25  GLUCOSE 113*  BUN 13  CREATININE 0.88  CALCIUM 9.2   GFR: Estimated Creatinine Clearance: 68.1 mL/min (by C-G formula based on SCr of 0.88 mg/dL). Recent Labs  Lab 10/02/19 1953  WBC 14.1*    Liver Function Tests: Recent Labs  Lab 10/02/19 1953  AST 17  ALT 19  ALKPHOS 74  BILITOT 0.4  PROT 7.4  ALBUMIN 4.5   No results for input(s): LIPASE, AMYLASE in the last 168 hours. No results for input(s): AMMONIA in the last 168 hours.  ABG No results found for: PHART, PCO2ART, PO2ART, HCO3, TCO2, ACIDBASEDEF, O2SAT   Coagulation Profile: Recent Labs  Lab 10/02/19 1953  INR 0.9    Cardiac Enzymes: No results for input(s): CKTOTAL, CKMB,  CKMBINDEX, TROPONINI in the last 168 hours.  HbA1C: No results found for: HGBA1C  CBG: Recent Labs  Lab 10/02/19 1955  GLUCAP 124*    Review of Systems:   Unable   Past Medical History  She,  has no past medical history on file.   Surgical History   Unable   Social History  Unable   Family History   Her family history is not on file.   Allergies No Known Allergies   Home Medications  Prior to Admission medications   Medication Sig Start Date End Date Taking? Authorizing Provider  ibuprofen (ADVIL) 200 MG tablet Take 200 mg by mouth every 6 (six) hours as needed for mild pain or moderate pain.   Yes [provider]     CRITICAL CARE Performed by: Posey Boyer   Total critical care time: 35 minutes   Critical care time was exclusive of separately billable procedures and treating other patients.   Critical  care was necessary to treat or prevent imminent or life-threatening deterioration.   Critical care was time spent personally by me on the following activities: development of treatment plan with patient and/or surrogate as well as nursing, discussions with consultants, evaluation of patient's response to treatment, examination of patient, obtaining history from patient or surrogate, ordering and performing treatments and interventions, ordering and review of laboratory studies, ordering and review of radiographic studies, pulse oximetry and re-evaluation of patient's condition.  Posey Boyer, MSN, AGACNP-BC [facility] 10/03/2019, 1:19 AM

## 2019-10-03 NOTE — Consult Note (Signed)
NAME:  Erin Peck, MRN:  [id number], DOB:  June 12, 1966, LOS: 1 ADMISSION DATE:  10/02/2019, CONSULTATION DATE: 10/03/2019 REFERRING MD:  Dr. 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   Past Medical History  Migraine HA  Significant Hospital Events   3/8 Admitted/ tPA/ mechanical thrombectomy   Consults:  Neuro IR   Procedures:  3/8 ETT >> 3/8 left radial aline >> 3/8 right femoral sheath >>  Significant Diagnostic Tests:  3/8 CTH >> 1. No acute cortically based infarct or acute intracranial hemorrhage identified. ASPECTS 10. 2. 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  3/8 CTA head/ neck  >> 1. Positive CTA for emergent large vessel occlusion, with occlusive thrombus involving the distal left M1 segment/left MCA bifurcation. Fairly robust perfusion within the distal left MCA branches distally, could be related to subocclusive thrombus and/or collateralization. 2. 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 3. 50% stenosis at the origin of the dominant right vertebral artery. 4. Hypoplastic left vertebral artery, occluded within the neck, with distal reconstitution by the skull base. 5.  Emphysema   3/8 CT perfusion study >> 19 cc acute core infarct involving the mid and posterior left frontal region, left MCA distribution. 94 cc surrounding ischemic penumbra, mismatch volume 75 cc.  Micro Data:  3/8 SARS 2 / Flu A/ B >> neg  Antimicrobials:  3/8 cefazolin pre op  Interim history/subjective:  Remains on vent, sedation.  Objective   Blood pressure 106/67, pulse (!) 102, temperature 97.8 F (36.6 C), temperature source Axillary, resp. rate 15, height 5\' 5"  (1.651 m), weight 74.1 kg, SpO2 100 %.    Vent Mode: PSV;CPAP FiO2 (%):  [40 %] 40 % Set  Rate:  [16 bmp-20 bmp] 20 bmp Vt Set:  [380 mL-460 mL] 380 mL PEEP:  [5 cmH20-8 cmH20] 5 cmH20 Pressure Support:  [12 cmH20] 12 cmH20 Plateau Pressure:  [14 cmH20-17 cmH20] 14 cmH20   Intake/Output Summary (Last 24 hours) at 10/03/2019 0809 Last data filed at 10/03/2019 0700 Gross per 24 hour  Intake 1652.53 ml  Output 25 ml  Net 1627.53 ml   Filed Weights   10/02/19 1952 10/03/19 0130  Weight: 60 kg 74.1 kg    Examination:  General - sedated Eyes - pupils reactive ENT - ETT in place Cardiac - regular rate/rhythm, no murmur Chest - equal breath sounds b/l, no wheezing or rales Abdomen - soft, non tender, + bowel sounds Extremities - no cyanosis, clubbing, or edema Skin - no rashes Neuro - RASS -2   Resolved Hospital Problem list    Assessment & Plan:   Acute hypoxic respiratory failure with compromised airway in setting of CVA. - pressure support as tolerated - defer extubation trial until neuro status stable  Left MCA M1 CVA s/p tPA and mechanical thrombectomy requiring rescue stenting. - continue ASA, brilinta - f/u Echo  Hypertension. - goal SBP 120 to 140 per neuro-IR   Best practice:  Diet: NPO, tube feeds if unable to extubate soon DVT prophylaxis: SCDs GI prophylaxis: PPI Mobility: BR Code Status: full  Disposition: Neuro ICU  Labs    CMP Latest Ref Rng & Units 10/03/2019 10/03/2019 10/02/2019  Glucose 70 - 99 mg/dL - 171(H) 113(H)  BUN 6 - 20 mg/dL - 9 13  Creatinine 0.44 -  1.00 mg/dL - 5.43 6.06  Sodium 770 - 145 mmol/L 139 139 140  Potassium 3.5 - 5.1 mmol/L 3.8 4.1 4.3  Chloride 98 - 111 mmol/L - 108 107  CO2 22 - 32 mmol/L - 22 25  Calcium 8.9 - 10.3 mg/dL - 8.9 9.2  Total Protein 6.5 - 8.1 g/dL - - 7.4  Total Bilirubin 0.3 - 1.2 mg/dL - - 0.4  Alkaline Phos 38 - 126 U/L - - 74  AST 15 - 41 U/L - - 17  ALT 0 - 44 U/L - - 19    CBC Latest Ref Rng & Units 10/03/2019 10/03/2019 10/02/2019  WBC 4.0 - 10.5 K/uL - 11.4(H) 14.1(H)  Hemoglobin 12.0 -  15.0 g/dL 34.0 35.2 48.1  Hematocrit 36.0 - 46.0 % 40.0 40.9 45.3  Platelets 150 - 400 K/uL - 356 366    ABG    Component Value Date/Time   PHART 7.336 (L) 10/03/2019 0231   PCO2ART 43.4 10/03/2019 0231   PO2ART 106.0 10/03/2019 0231   HCO3 23.4 10/03/2019 0231   TCO2 25 10/03/2019 0231   ACIDBASEDEF 3.0 (H) 10/03/2019 0231   O2SAT 98.0 10/03/2019 0231    CBG (last 3)  Recent Labs    10/02/19 1955  GLUCAP 124*    CC time 33 minutes  Coralyn Helling, MD [facility] 10/03/2019, 8:16 AM

## 2019-10-03 NOTE — Progress Notes (Addendum)
Referring Physician(s): Code Stroke- Milon Dikes  Supervising Physician: Julieanne Cotton  Patient Status:  Erin Peck - In-pt  Chief Complaint: None- intubated with sedation  Subjective:  History of acute CVA s/p cerebral arteriogram with emergent mechanical thrombectomy of left MCA M1 occlusion, along with rescue Y stenting of left MCA M1 superior and inferior division reocclusion achieving a TICI 2C revascularization 10/02/2019 by Dr. Corliss Skains. Patient laying in bed intubated with sedation. She opens eyes to voice/name and follows simple commands. Can spontaneously move left side, right hand grip intact, no spontaneous movements of RLE. Right groin incision c/d/i with sheath in place.   Allergies: Patient has no known allergies.  Medications: Prior to Admission medications   Medication Sig Start Date End Date Taking? Authorizing Provider  ibuprofen (ADVIL) 200 MG tablet Take 200 mg by mouth every 6 (six) hours as needed for mild pain or moderate pain.   Yes [provider]     Vital Signs: BP 106/67 (BP Location: Left Arm)   Pulse (!) 102   Temp (!) 97 F (36.1 C) (Axillary)   Resp 15   Ht  (1.651 m)   Wt 163 lb 5.8 oz (74.1 kg)   SpO2 100%   BMI 27.18 kg/m   Physical Exam Vitals and nursing note reviewed.  Constitutional:      General: She is not in acute distress.    Comments: Intubated with sedation.  Pulmonary:     Effort: Pulmonary effort is normal. No respiratory distress.     Comments: Intubated with sedation. Skin:    General: Skin is warm and dry.     Comments: Right groin incision soft with sheath in place, no active bleeding or hematoma.  Neurological:     Comments: Intubated with sedation. She opens eyes to voice/name and follows simple commands. PERRL bilaterally. Demonstrates left gaze preference. Can spontaneously move left side, right hand grip intact, no spontaneous movements of RLE. Distal pulses Dopplerable bilaterally (DPs  and PTs).  Psychiatric:     Comments: Intubated with sedation.     Imaging: CT ANGIO HEAD W OR WO CONTRAST  Result Date: 10/02/2019 CLINICAL DATA:  Initial evaluation for acute headache, right-sided weakness. EXAM: CT ANGIOGRAPHY HEAD AND NECK CT PERFUSION BRAIN TECHNIQUE: Multidetector CT imaging of the head and neck was performed using the standard protocol during bolus administration of intravenous contrast. Multiplanar CT image reconstructions and MIPs were obtained to evaluate the vascular anatomy. Carotid stenosis measurements (when applicable) are obtained utilizing NASCET criteria, using the distal internal carotid diameter as the denominator. Multiphase CT imaging of the brain was performed following IV bolus contrast injection. Subsequent parametric perfusion maps were calculated using RAPID software. CONTRAST:  OMNIPAQUE IOHEXOL 350 MG/ML SOLN COMPARISON:  Prior head CT from earlier same day. FINDINGS: CTA NECK FINDINGS Aortic arch: Visualized aortic arch normal in caliber. Bovine arch with common origin of the right brachiocephalic and left common carotid artery noted. Mild-to-moderate atheromatous plaque about the arch and origin of the great vessels without hemodynamically significant stenosis. Visualized subclavian arteries widely patent. Right carotid system: Right common carotid artery patent from its origin to the bifurcation without stenosis. Scattered eccentric calcified plaque about the right bifurcation/proximal right ICA with stenosis of up to approximately 50% by NASCET criteria. Right ICA otherwise widely patent distally to the skull base without stenosis, dissection, or occlusion. Left carotid system: Calcified plaque at the origin of the left CCA without high-grade stenosis. Left CCA otherwise patent to the  bifurcation without stenosis. Mild scattered calcified plaque about the left bifurcation/proximal left ICA without significant stenosis. Left ICA widely patent distally to  the skull base without stenosis, dissection or occlusion. Vertebral arteries: Both vertebral arteries arise from the subclavian arteries. Right vertebral artery dominant with a diffusely hypoplastic left vertebral artery. Focal atheromatous plaque at the origin of the right vertebral artery with approximate 50% stenosis. Right vertebral artery otherwise widely patent within the neck. 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 Distal reconstitution via muscular branches at the left V3 segment (series 7, image 158). Left vertebral is patent as it courses into the cranial vault. Skeleton: No acute osseous abnormality. No discrete or worrisome osseous lesions. Other neck: No other acute soft tissue abnormality within the neck. No adenopathy. Subcentimeter hypodense nodule noted within the right thyroid lobe, of doubtful significance given size and patient age. No follow-up imaging recommended regarding this lesion. Upper chest: Visualized upper chest demonstrates no acute finding. Partially visualized lungs are clear. Paraseptal and centrilobular emphysematous changes noted at the lung apices. Review of the MIP images confirms the above findings CTA HEAD FINDINGS Anterior circulation: Petrous segments widely patent bilaterally. Cavernous and supraclinoid ICAs patent without flow-limiting stenosis. A1 segments patent bilaterally. Right A1 hypoplastic, accounting for the slightly diminutive right ICA is compared to the left. Normal anterior communicating artery. Anterior cerebral arteries widely patent to their distal aspects. Right M1 widely patent. Normal right MCA bifurcation. Distal right MCA branches well perfused. Left M1 patent proximally. There is occlusive thrombus within the distal left M1 segment/left MCA bifurcation (series 7, image 94). Fairly good perfusion seen within the left MCA branches distally, which could be related to subocclusive thrombus and/or collateralization. Posterior  circulation: Vertebral arteries patent to the vertebrobasilar junction without stenosis. Neither PICA of visualized. Basilar widely patent to its distal aspect without stenosis. Superior cerebral arteries patent bilaterally. Both PCAs primarily supplied via the basilar and are well perfused or distal aspects. Small hypoplastic posterior communicating arteries noted bilaterally. Venous sinuses: Patent. Anatomic variants: None significant.  No aneurysm. Review of the MIP images confirms the above findings CT Brain Perfusion Findings: ASPECTS: 10 CBF (<30%) Volume: 19mL Perfusion (Tmax>6.0s) volume: 94mL Mismatch Volume: 75mL Infarction Location:Patchy acute core infarct seen involving the cortical and subcortical aspect of the mid and posterior left frontal region. Moderate sized surrounding ischemic penumbra. IMPRESSION: CTA HEAD AND NECK IMPRESSION: 1. Positive CTA for emergent large vessel occlusion, with occlusive thrombus involving the distal left M1 segment/left MCA bifurcation. Fairly robust perfusion within the distal left MCA branches distally, could be related to subocclusive thrombus and/or collateralization. 2. 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 3. 50% stenosis at the origin of the dominant right vertebral artery. 4. Hypoplastic left vertebral artery, occluded within the neck, with distal reconstitution by the skull base. 5.  Emphysema (ICD10-J43.9). CT PERFUSION IMPRESSION: 19 cc acute core infarct involving the mid and posterior left frontal region, left MCA distribution. 94 cc surrounding ischemic penumbra, mismatch volume 75 cc. Critical Value/emergent results were called by telephone at the time of interpretation on 10/02/2019 at 9:05 pm to provider Dr. Deretha Emory, who verbally acknowledged these results. Electronically Signed   By: Rise Mu M.D.   On: 10/02/2019 21:31   CT ANGIO NECK W OR WO CONTRAST  Result Date: 10/02/2019 CLINICAL DATA:  Initial evaluation for  acute headache, right-sided weakness. EXAM: CT ANGIOGRAPHY HEAD AND NECK CT PERFUSION BRAIN TECHNIQUE: Multidetector CT imaging of the head and neck was  performed using the standard protocol during bolus administration of intravenous contrast. Multiplanar CT image reconstructions and MIPs were obtained to evaluate the vascular anatomy. Carotid stenosis measurements (when applicable) are obtained utilizing NASCET criteria, using the distal internal carotid diameter as the denominator. Multiphase CT imaging of the brain was performed following IV bolus contrast injection. Subsequent parametric perfusion maps were calculated using RAPID software. CONTRAST:  OMNIPAQUE IOHEXOL 350 MG/ML SOLN COMPARISON:  Prior head CT from earlier same day. FINDINGS: CTA NECK FINDINGS Aortic arch: Visualized aortic arch normal in caliber. Bovine arch with common origin of the right brachiocephalic and left common carotid artery noted. Mild-to-moderate atheromatous plaque about the arch and origin of the great vessels without hemodynamically significant stenosis. Visualized subclavian arteries widely patent. Right carotid system: Right common carotid artery patent from its origin to the bifurcation without stenosis. Scattered eccentric calcified plaque about the right bifurcation/proximal right ICA with stenosis of up to approximately 50% by NASCET criteria. Right ICA otherwise widely patent distally to the skull base without stenosis, dissection, or occlusion. Left carotid system: Calcified plaque at the origin of the left CCA without high-grade stenosis. Left CCA otherwise patent to the bifurcation without stenosis. Mild scattered calcified plaque about the left bifurcation/proximal left ICA without significant stenosis. Left ICA widely patent distally to the skull base without stenosis, dissection or occlusion. Vertebral arteries: Both vertebral arteries arise from the subclavian arteries. Right vertebral artery dominant with a  diffusely hypoplastic left vertebral artery. Focal atheromatous plaque at the origin of the right vertebral artery with approximate 50% stenosis. Right vertebral artery otherwise widely patent within the neck. 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 Distal reconstitution via muscular branches at the left V3 segment (series 7, image 158). Left vertebral is patent as it courses into the cranial vault. Skeleton: No acute osseous abnormality. No discrete or worrisome osseous lesions. Other neck: No other acute soft tissue abnormality within the neck. No adenopathy. Subcentimeter hypodense nodule noted within the right thyroid lobe, of doubtful significance given size and patient age. No follow-up imaging recommended regarding this lesion. Upper chest: Visualized upper chest demonstrates no acute finding. Partially visualized lungs are clear. Paraseptal and centrilobular emphysematous changes noted at the lung apices. Review of the MIP images confirms the above findings CTA HEAD FINDINGS Anterior circulation: Petrous segments widely patent bilaterally. Cavernous and supraclinoid ICAs patent without flow-limiting stenosis. A1 segments patent bilaterally. Right A1 hypoplastic, accounting for the slightly diminutive right ICA is compared to the left. Normal anterior communicating artery. Anterior cerebral arteries widely patent to their distal aspects. Right M1 widely patent. Normal right MCA bifurcation. Distal right MCA branches well perfused. Left M1 patent proximally. There is occlusive thrombus within the distal left M1 segment/left MCA bifurcation (series 7, image 94). Fairly good perfusion seen within the left MCA branches distally, which could be related to subocclusive thrombus and/or collateralization. Posterior circulation: Vertebral arteries patent to the vertebrobasilar junction without stenosis. Neither PICA of visualized. Basilar widely patent to its distal aspect without stenosis. Superior  cerebral arteries patent bilaterally. Both PCAs primarily supplied via the basilar and are well perfused or distal aspects. Small hypoplastic posterior communicating arteries noted bilaterally. Venous sinuses: Patent. Anatomic variants: None significant.  No aneurysm. Review of the MIP images confirms the above findings CT Brain Perfusion Findings: ASPECTS: 10 CBF (<30%) Volume: 19mL Perfusion (Tmax>6.0s) volume: 94mL Mismatch Volume: 75mL Infarction Location:Patchy acute core infarct seen involving the cortical and subcortical aspect of the mid and posterior left  frontal region. Moderate sized surrounding ischemic penumbra. IMPRESSION: CTA HEAD AND NECK IMPRESSION: 1. Positive CTA for emergent large vessel occlusion, with occlusive thrombus involving the distal left M1 segment/left MCA bifurcation. Fairly robust perfusion within the distal left MCA branches distally, could be related to subocclusive thrombus and/or collateralization. 2. 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 3. 50% stenosis at the origin of the dominant right vertebral artery. 4. Hypoplastic left vertebral artery, occluded within the neck, with distal reconstitution by the skull base. 5.  Emphysema (ICD10-J43.9). CT PERFUSION IMPRESSION: 19 cc acute core infarct involving the mid and posterior left frontal region, left MCA distribution. 94 cc surrounding ischemic penumbra, mismatch volume 75 cc. Critical Value/emergent results were called by telephone at the time of interpretation on 10/02/2019 at 9:05 pm to provider Dr. Deretha Emory, who verbally acknowledged these results. Electronically Signed   By: Rise Mu M.D.   On: 10/02/2019 21:31   CT CEREBRAL PERFUSION W CONTRAST  Result Date: 10/02/2019 CLINICAL DATA:  Initial evaluation for acute headache, right-sided weakness. EXAM: CT ANGIOGRAPHY HEAD AND NECK CT PERFUSION BRAIN TECHNIQUE: Multidetector CT imaging of the head and neck was performed using the standard  protocol during bolus administration of intravenous contrast. Multiplanar CT image reconstructions and MIPs were obtained to evaluate the vascular anatomy. Carotid stenosis measurements (when applicable) are obtained utilizing NASCET criteria, using the distal internal carotid diameter as the denominator. Multiphase CT imaging of the brain was performed following IV bolus contrast injection. Subsequent parametric perfusion maps were calculated using RAPID software. CONTRAST:  OMNIPAQUE IOHEXOL 350 MG/ML SOLN COMPARISON:  Prior head CT from earlier same day. FINDINGS: CTA NECK FINDINGS Aortic arch: Visualized aortic arch normal in caliber. Bovine arch with common origin of the right brachiocephalic and left common carotid artery noted. Mild-to-moderate atheromatous plaque about the arch and origin of the great vessels without hemodynamically significant stenosis. Visualized subclavian arteries widely patent. Right carotid system: Right common carotid artery patent from its origin to the bifurcation without stenosis. Scattered eccentric calcified plaque about the right bifurcation/proximal right ICA with stenosis of up to approximately 50% by NASCET criteria. Right ICA otherwise widely patent distally to the skull base without stenosis, dissection, or occlusion. Left carotid system: Calcified plaque at the origin of the left CCA without high-grade stenosis. Left CCA otherwise patent to the bifurcation without stenosis. Mild scattered calcified plaque about the left bifurcation/proximal left ICA without significant stenosis. Left ICA widely patent distally to the skull base without stenosis, dissection or occlusion. Vertebral arteries: Both vertebral arteries arise from the subclavian arteries. Right vertebral artery dominant with a diffusely hypoplastic left vertebral artery. Focal atheromatous plaque at the origin of the right vertebral artery with approximate 50% stenosis. Right vertebral artery otherwise  widely patent within the neck. 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 Distal reconstitution via muscular branches at the left V3 segment (series 7, image 158). Left vertebral is patent as it courses into the cranial vault. Skeleton: No acute osseous abnormality. No discrete or worrisome osseous lesions. Other neck: No other acute soft tissue abnormality within the neck. No adenopathy. Subcentimeter hypodense nodule noted within the right thyroid lobe, of doubtful significance given size and patient age. No follow-up imaging recommended regarding this lesion. Upper chest: Visualized upper chest demonstrates no acute finding. Partially visualized lungs are clear. Paraseptal and centrilobular emphysematous changes noted at the lung apices. Review of the MIP images confirms the above findings CTA HEAD FINDINGS Anterior circulation: Petrous segments widely  patent bilaterally. Cavernous and supraclinoid ICAs patent without flow-limiting stenosis. A1 segments patent bilaterally. Right A1 hypoplastic, accounting for the slightly diminutive right ICA is compared to the left. Normal anterior communicating artery. Anterior cerebral arteries widely patent to their distal aspects. Right M1 widely patent. Normal right MCA bifurcation. Distal right MCA branches well perfused. Left M1 patent proximally. There is occlusive thrombus within the distal left M1 segment/left MCA bifurcation (series 7, image 94). Fairly good perfusion seen within the left MCA branches distally, which could be related to subocclusive thrombus and/or collateralization. Posterior circulation: Vertebral arteries patent to the vertebrobasilar junction without stenosis. Neither PICA of visualized. Basilar widely patent to its distal aspect without stenosis. Superior cerebral arteries patent bilaterally. Both PCAs primarily supplied via the basilar and are well perfused or distal aspects. Small hypoplastic posterior communicating arteries  noted bilaterally. Venous sinuses: Patent. Anatomic variants: None significant.  No aneurysm. Review of the MIP images confirms the above findings CT Brain Perfusion Findings: ASPECTS: 10 CBF (<30%) Volume: 10mL Perfusion (Tmax>6.0s) volume: 72mL Mismatch Volume: 53mL Infarction Location:Patchy acute core infarct seen involving the cortical and subcortical aspect of the mid and posterior left frontal region. Moderate sized surrounding ischemic penumbra. IMPRESSION: CTA HEAD AND NECK IMPRESSION: 1. Positive CTA for emergent large vessel occlusion, with occlusive thrombus involving the distal left M1 segment/left MCA bifurcation. Fairly robust perfusion within the distal left MCA branches distally, could be related to subocclusive thrombus and/or collateralization. 2. 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 3. 50% stenosis at the origin of the dominant right vertebral artery. 4. Hypoplastic left vertebral artery, occluded within the neck, with distal reconstitution by the skull base. 5.  Emphysema (ICD10-J43.9). CT PERFUSION IMPRESSION: 19 cc acute core infarct involving the mid and posterior left frontal region, left MCA distribution. 94 cc surrounding ischemic penumbra, mismatch volume 75 cc. Critical Value/emergent results were called by telephone at the time of interpretation on 10/02/2019 at 9:05 pm to provider Dr. Rogene Houston, who verbally acknowledged these results. Electronically Signed   By: Jeannine Boga M.D.   On: 10/02/2019 21:31   DG Chest Port 1 View  Result Date: 10/03/2019 CLINICAL DATA:  54 year old female status post endovascular treatment of left MCA M1 emergent large vessel occlusion. EXAM: PORTABLE CHEST 1 VIEW COMPARISON:  CTA head and neck earlier tonight. FINDINGS: Portable AP supine view at 0141 hours. Endotracheal tube tip in good position between the level the clavicles and carina. Enteric tube courses to the stomach but the side hole is at the level of the distal  esophagus. Normal cardiac size and mediastinal contours. Allowing for portable technique the lungs are clear. No acute osseous abnormality identified. Negative visible bowel gas pattern. IMPRESSION: 1. Advance the enteric tube 5 cm to ensure side hole placement within the stomach. 2. Endotracheal tube tip in good position. 3.  No acute cardiopulmonary abnormality. Electronically Signed   By: Genevie Ann M.D.   On: 10/03/2019 01:57   DG Abd Portable 1V  Result Date: 10/03/2019 CLINICAL DATA:  NG tube placement. EXAM: PORTABLE ABDOMEN - 1 VIEW COMPARISON:  Chest x-ray, same date. FINDINGS: The NG tube is in good position with its tip in the body region the stomach laterally. The proximal port is now well below the GE junction. Contrast noted in both renal collecting systems and ureters. There is also some contrast surrounding the right kidney suggesting a urinary leak or obstruction. Recommend clinical correlation. IMPRESSION: 1. NG tube in good position. 2. Contrast surrounding  the right kidney suggesting a urinary leak or obstruction. Recommend clinical correlation. CT abdomen/pelvis may be helpful for further evaluation. Electronically Signed   By: Rudie Meyer M.D.   On: 10/03/2019 07:07   CT HEAD CODE STROKE WO CONTRAST  Result Date: 10/02/2019 CLINICAL DATA:  Code stroke. 54 year old female with severe headache and right side weakness since 1750 hours. EXAM: CT HEAD WITHOUT CONTRAST TECHNIQUE: Contiguous axial images were obtained from the base of the skull through the vertex without intravenous contrast. COMPARISON:  None. FINDINGS: Brain: No midline shift, mass effect, or evidence of intracranial mass lesion. No ventriculomegaly. No acute intracranial hemorrhage identified. No cortically based acute infarct identified. Chronic appearing white matter hypodensity at the anterior left corona radiata on series 2, image 16. Elsewhere gray-white matter differentiation is within normal limits. Vascular: No  suspicious intracranial vascular hyperdensity. Skull: Negative. Sinuses/Orbits: Clear aside from some right posterior ethmoid air cell mucosal thickening. Mastoids and tympanic cavities appear clear. Other: Visualized orbits and scalp soft tissues are within normal limits. ASPECTS [facility] Stroke Program Early CT Score) Total score (0-10 with 10 being normal): 10 IMPRESSION: 1. No acute cortically based infarct or acute intracranial hemorrhage identified. ASPECTS 10. 2. 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 Study discussed by telephone with Dr. Vanetta Mulders on 10/02/2019 at 20:18 . Electronically Signed   By: Odessa Fleming M.D.   On: 10/02/2019 20:20    Labs:  CBC: Recent Labs    10/02/19 1953 10/03/19 0212 10/03/19 0231  WBC 14.1* 11.4*  --   HGB 14.7 13.8 13.6  HCT 45.3 40.9 40.0  PLT 366 356  --     COAGS: Recent Labs    10/02/19 1953  INR 0.9  APTT 27    BMP: Recent Labs    10/02/19 1953 10/03/19 0212 10/03/19 0231  NA 140 139 139  K 4.3 4.1 3.8  CL 107 108  --   CO2 25 22  --   GLUCOSE 113* 171*  --   BUN 13 9  --   CALCIUM 9.2 8.9  --   CREATININE 0.88 0.79  --   GFRNONAA >60 >60  --   GFRAA >60 >60  --     LIVER FUNCTION TESTS: Recent Labs    10/02/19 1953  BILITOT 0.4  AST 17  ALT 19  ALKPHOS 74  PROT 7.4  ALBUMIN 4.5    Assessment and Plan:  History of acute CVA s/p cerebral arteriogram with emergent mechanical thrombectomy of left MCA M1 occlusion, along with rescue Y stenting of left MCA M1 superior and inferior division reocclusion achieving a TICI 2C revascularization 10/02/2019 by Dr. Corliss Skains. Patient's condition improved- remains intubated/sedated but opens eyes to voice and follows simple commands, moving left side, can grip with right hand, no movements of RLE. Right groin incision stable with sheath in place- plan for sheath removal today, post-sheath orders in place. Continue  taking Brilinta 90 mg twice daily and Aspirin 81 mg once daily, ok to discontinue Aggrastat 30 minutes after Brilinta administration this AM per Dr. Corliss Skains- will obtain P2Y12 today to check effectiveness of DAPT. Further plans per neurology/CCM- appreciate and agree with management. NIR to follow.   ADDENDUM: P2Y12 unable to be resulted until patient off Aggrastat for 48 hours (interfering medication per lab). Will re-order P2Y12 for Thursday- order placed. Dr. Corliss Skains aware.     Electronically Signed: Elwin Mocha, PA-C 10/03/2019, 8:53 AM  I spent a total of 25 Minutes at the the patient's bedside AND on the patient's hospital floor or unit, greater than 50% of which was counseling/coordinating care for CVA s/p revascularization.

## 2019-10-03 NOTE — Progress Notes (Signed)
*  PRELIMINARY RESULTS* Echocardiogram 2D Echocardiogram has been performed.  10/03/2019, 3:05 PM

## 2019-10-03 NOTE — Progress Notes (Signed)
Patient transported to MRI and back to 4N32 with no complications.

## 2019-10-03 NOTE — Progress Notes (Signed)
Patient's rings and clothing given to daughter, Deloria Lair, to take home.  Aris Lot, RN

## 2019-10-03 NOTE — Progress Notes (Signed)
Patient ID: Erin Peck, female   DOB: 1966/01/13, 54 y.o.   MRN: [id number] INR. 53 YR RT H F  MRSS 0  LSW 17 50 hrs. New onset of RT sided paralysis Lt gaze preference and aphasia. CT brain No ICH. ASPECTS 8. 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 Trickle flow. CTP core of 19 cc vb penumbra of 75 cc. Patient received IVTPA .  Endovascular treatment D/W spouse over a 3 way call with neuro hospital ist. Procedure,reasons,alternatives discussed . Risks of ICH of 10 % ,worsening neuro deficit,death ,inability to revascularize were reviewed. Spouse expressed understanding and consented to the treatment. S.Sincere Berlanga MD

## 2019-10-03 NOTE — Progress Notes (Signed)
Spoke with pt daughter, Deloria Lair, who requested pt belongings to include her rings be left in the room until tomorrow when family can come and take them home.

## 2019-10-03 NOTE — Procedures (Signed)
S/P Lt common carotid arteriogram followed by revascularization of occluded Lt MCA M1 occlusion with x 2 passes with 38mm x 40 mm solitaire X  retriever and x 1 pass with solitaire X 6 mm x 40 mm retriever and penumbra aspiration achieving a TICI 2C revascularization with rescue stenting of reocclusion  With Y stenting  Into the sup and inf divisions achievin a TICI 2C  revascularization,  Occlusion of  of sup division stent with slow revascularization following use of 6 mg of IA integrelin.Marland Kitchen Post procedure CT scan   Reveals contrast stain in the basal ganglia. No mass effect or gross hemorrhage.. Groin  51F sheath in place due to entry at the bifurcation... Patient left intubated to protect airway. Distal ,pulses palpable DP and PT bilaterally. S.Eward Rutigliano MD

## 2019-10-03 NOTE — Progress Notes (Signed)
OT Cancellation Note  Patient Details Name: Erin Peck MRN: [id number] DOB: 1966-04-22   Cancelled Treatment:    Reason Eval/Treat Not Completed: Medical issues which prohibited therapy (Intubated and with sheath in place. Will return as schedule allows.)  Erin Peck MSOT, OTR/L Acute Rehab Pager: [phone] Office: [phone] 10/03/2019, 2:18 PM

## 2019-10-03 NOTE — Progress Notes (Signed)
CODE STROKE CT Times Beeper 1929 Exam started 1931 Exam finished 1932 Images sent to [facility] 1935 Exam completed in [facility] Radiology called [phone]

## 2019-10-03 NOTE — Progress Notes (Signed)
eLink Physician-Brief Progress Note Patient Name: Erin Peck DOB: 07/02/1966 MRN: [id number]   Date of Service  10/03/2019  HPI/Events of Note  Agitation - Presently on a Propofol IV infusion. No order for same.   eICU Interventions  Will order: 1. Propofol IV infusion titrate to RASS = 0 to -1.      Intervention Category Major Interventions: Delirium, psychosis, severe agitation - evaluation and management  Erin Peck 10/03/2019, 4:07 AM

## 2019-10-03 NOTE — Progress Notes (Signed)
PT Cancellation Note  Patient Details Name: Erin Peck MRN: [id number] DOB: [birth year]   Cancelled Treatment:    Reason Eval/Treat Not Completed: Patient not medically ready;Medical issues which prohibited therapy.  Will see 3/10 as able. 10/03/2019  Jacinto Halim., PT Acute Rehabilitation Services [phone]  (pager) [phone]  (office)   Erin Peck 10/03/2019, 11:40 AM

## 2019-10-03 NOTE — Progress Notes (Signed)
Pt arrived to 4N32 approximately 0105 with Propofol infusing 50 mcg/kg/min, and Neo infusing 20 mcg/min. Pt HTN per L A-line, zeroed with appropriate wave. R fem sheath with whip, reading slightly higher than a-line. Both reading higher than cuff. Neo stopped. Aggrastat initiated. 3rd IV established, Cleviprex initiated. Pt remained with HTN while being assessed/stimulated. Once assessments had been done and med titrated for BP goals a-line, sheath, and cuff all correlating fairly well with MAP in the 80's. . Will cont to monitor and titrate med for BP goals. Sheath site level 0 upon arrival and has remained thus far.  PATIENT BELONGINGS: clothing (sweats and panties), wedding ring set, will send to security.

## 2019-10-03 NOTE — Progress Notes (Signed)
Advanced OG 5 cm per x-ray results/recommendation. Tube has been and remains clamped. Will reorder routine abd port x-ray for verification

## 2019-10-04 ENCOUNTER — Inpatient Hospital Stay (HOSPITAL_COMMUNITY): Payer: Medicaid Other

## 2019-10-04 DIAGNOSIS — I639 Cerebral infarction, unspecified: Secondary | ICD-10-CM

## 2019-10-04 DIAGNOSIS — E7800 Pure hypercholesterolemia, unspecified: Secondary | ICD-10-CM

## 2019-10-04 DIAGNOSIS — I63512 Cerebral infarction due to unspecified occlusion or stenosis of left middle cerebral artery: Secondary | ICD-10-CM

## 2019-10-04 DIAGNOSIS — Z978 Presence of other specified devices: Secondary | ICD-10-CM

## 2019-10-04 LAB — BASIC METABOLIC PANEL
Anion gap: 8 (ref 5–15)
BUN: 8 mg/dL (ref 6–20)
CO2: 23 mmol/L (ref 22–32)
Calcium: 8.2 mg/dL — ABNORMAL LOW (ref 8.9–10.3)
Chloride: 112 mmol/L — ABNORMAL HIGH (ref 98–111)
Creatinine, Ser: 0.79 mg/dL (ref 0.44–1.00)
GFR calc Af Amer: >60 mL/min (ref >60)
GFR calc non Af Amer: >60 mL/min (ref >60)
Glucose, Bld: 110 mg/dL — ABNORMAL HIGH (ref 70–99)
Potassium: 3.6 mmol/L (ref 3.5–5.1)
Sodium: 143 mmol/L (ref 135–145)

## 2019-10-04 LAB — RPR: RPR Ser Ql: NONREACTIVE

## 2019-10-04 LAB — VITAMIN B12: Vitamin B-12: 227 pg/mL (ref 180–914)

## 2019-10-04 LAB — TSH: TSH: 1.946 u[IU]/mL (ref 0.350–4.500)

## 2019-10-04 IMAGING — CT CT ANGIO HEAD
1 of 10 series · 6 of 47 positions shown · IV contrast (Omni 300)
Comparison: Brain MRI from yesterday

CLINICAL DATA: Stroke follow-up

EXAM:
CT ANGIOGRAPHY HEAD
TECHNIQUE: Multidetector CT imaging of the head was performed using the
standard protocol during bolus administration of intravenous
contrast. Multiplanar CT image reconstructions and MIPs were
obtained to evaluate the vascular anatomy.
CONTRAST:  75mL OMNIPAQUE IOHEXOL 350 MG/ML SOLN

[Series 9: cow 2.0 · axial · 0.43mm/px · z∈[-162,-48]mm · 6 of 81 slices shown]
[im 12/81  brain]
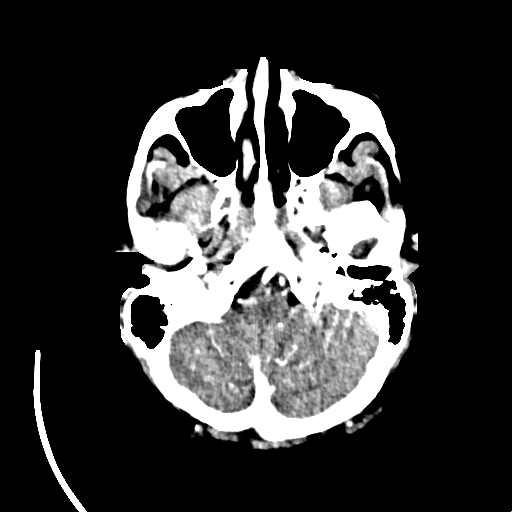
[im 23/81  bone]
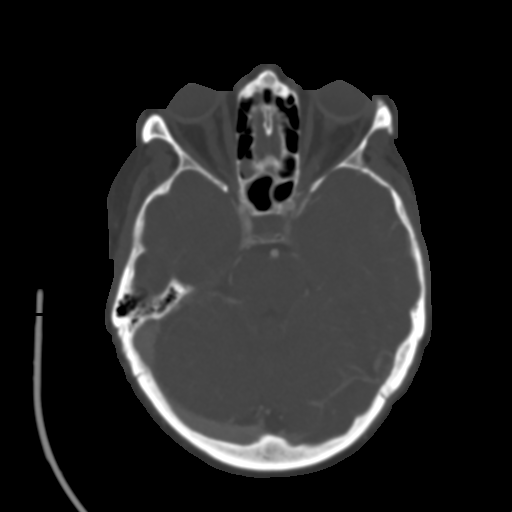
[im 35/81  brain]
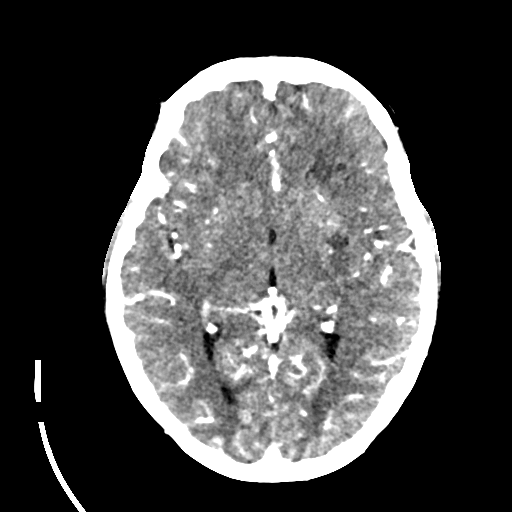
[im 46/81  bone]
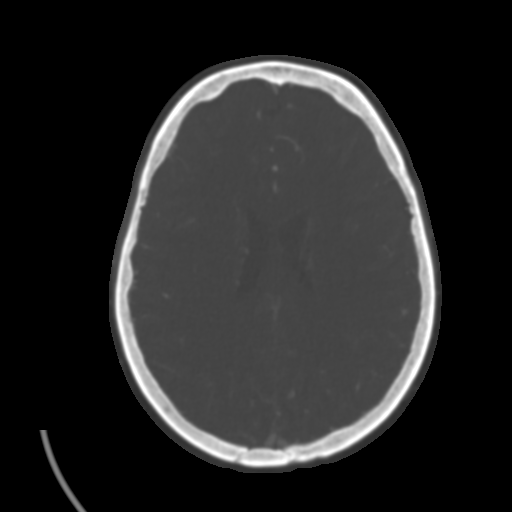
[im 58/81  brain]
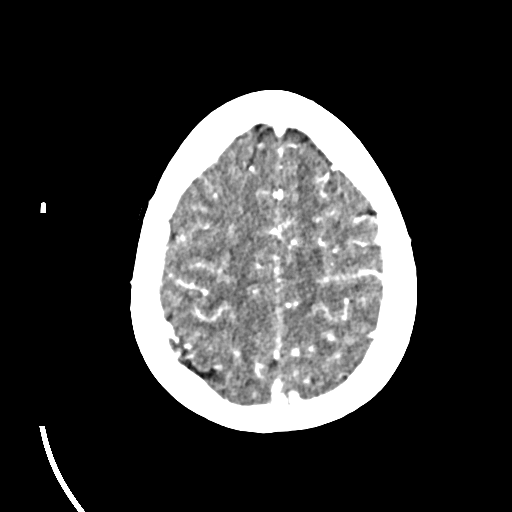
[im 69/81  bone]
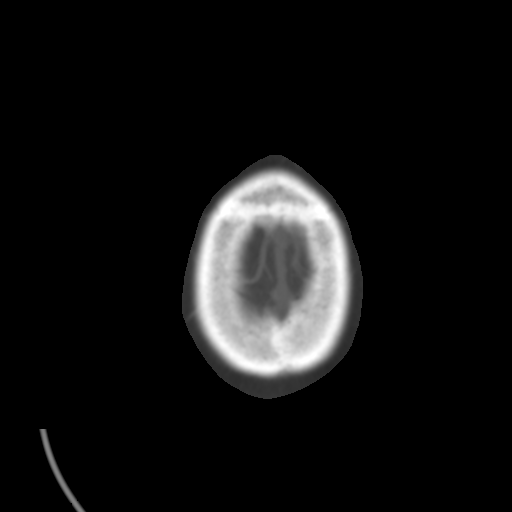

[6 of 47 positions shown; findings below may reference images not displayed]

FINDINGS: CT HEAD

Brain: Cytotoxic edema from infarct in the left basal ganglia and
adjacent white matter, unchanged in extent from prior brain MRI.
There is also pre-existing lacunar infarct in the left frontal white
matter. No hemorrhagic conversion or midline shift.

Vascular: See below

Skull: Negative

Sinuses: Patchy chronic appearing sinusitis

Orbits: Negative

CTA HEAD

Anterior circulation: The siphons are symmetrically patent. Y stent
at the left M1 to M2 segments with nonocclusive thrombus seen in the
lumen of the upper limb. The lower limb is well enhancing. Limited
visualization at the margins of the stent due to artifact from
radiopaque markers. Hypoplastic right A1 segment. Negative for
aneurysm or right-sided embolic disease

Posterior circulation: Right dominant vertebral artery. Basilar is
smooth and widely patent. No branch occlusion or beading. Negative
for aneurysm.

Venous sinuses: Diffusely patent

Anatomic variants: None significant

Call has been placed to the Neurology team.
IMPRESSION: 1. Y stenting of the left M1 and M2 segments with nonocclusive in
stent thrombus narrowing the upper limb.
2. Acute left MCA territory infarct is unchanged from brain MRI
yesterday.

## 2019-10-04 MED ORDER — IOHEXOL 350 MG/ML SOLN
75.0000 mL | Freq: Once | INTRAVENOUS | Status: AC | PRN
  Administered 2019-10-04: 75 mL via INTRAVENOUS

## 2019-10-04 MED ORDER — SODIUM CHLORIDE 0.9 % IV BOLUS
500.0000 mL | Freq: Once | INTRAVENOUS | Status: AC
  Administered 2019-10-04: 500 mL via INTRAVENOUS

## 2019-10-04 MED ORDER — ALBUMIN HUMAN 5 % IV SOLN
12.5000 g | Freq: Four times a day (QID) | INTRAVENOUS | Status: AC
  Administered 2019-10-04 – 2019-10-05 (×4): 12.5 g via INTRAVENOUS
  Filled 2019-10-04 (×4): qty 250

## 2019-10-04 MED ORDER — MIDODRINE HCL 5 MG PO TABS
5.0000 mg | ORAL_TABLET | Freq: Three times a day (TID) | ORAL | Status: DC
  Administered 2019-10-04 – 2019-10-07 (×10): 5 mg via ORAL
  Filled 2019-10-04 (×10): qty 1

## 2019-10-04 MED ORDER — ORAL CARE MOUTH RINSE
15.0000 mL | Freq: Two times a day (BID) | OROMUCOSAL | Status: DC
  Administered 2019-10-04 – 2019-10-07 (×7): 15 mL via OROMUCOSAL

## 2019-10-04 NOTE — Progress Notes (Signed)
Patient transported from 4N32 to CT and back with no complications.

## 2019-10-04 NOTE — Progress Notes (Signed)
Patient extubated around 1200.  Systolic blood pressure goal 130-160 per neurology.  Blood pressure parameter not met, Stroke MD paged and made aware.  No new orders at this time.  RN will continue to monitor for neuro change.

## 2019-10-04 NOTE — Evaluation (Signed)
Clinical/Bedside Swallow Evaluation Patient Details  Name: Erin Peck MRN: [id number] Date of Birth: 1966/06/27  Today's Date: 10/04/2019 Time: SLP Start Time (ACUTE ONLY): 1535 SLP Stop Time (ACUTE ONLY): 1554 SLP Time Calculation (min) (ACUTE ONLY): 19 min  Past Medical History: No past medical history on file. Past Surgical History: The histories are not reviewed yet. Please review them in the "History" navigator section and refresh this SmartLink. HPI:  54 year old female with acute Left MCA M1 occlusion s/p tPA and mechanical thrombectomy returning to ICU. Intubated 3/8-3/10.   Assessment / Plan / Recommendation Clinical Impression   Pt was upright in bed and cooperative for session. Pt's oral mech revealed reduced R lingual strength (CN XII) and mild R facial weakness (CN VII). Pt was also noted to have very hoarse and breathy voice, talking only when prompted by SLP. Pt was observed with ice chips, thin liquids, purees and soft solids. Following ice chips and thin liquids, pt was noted to have immediate/delayed throat clearing and coughing intermittently. Pt was also observed with suspected delayed swallow initiation and multiple swallows following purees and soft solids. Given s/sx of aspiration and recent extubation, recommend continued NPO with ice chips following oral care and meds crushed in puree. Will continue to follow for PO readiness, and completion of speech language evaluation.  SLP Visit Diagnosis: Dysphagia, oropharyngeal phase (R13.12)    Aspiration Risk  Mild aspiration risk;Moderate aspiration risk    Diet Recommendation Ice chips PRN after oral care   Medication Administration: Crushed with puree Supervision: Full supervision/cueing for compensatory strategies    Other  Recommendations Oral Care Recommendations: Oral care BID;Oral care prior to ice chip/H20   Follow up Recommendations Other (comment)(tba)      Frequency and Duration min 2x/week  2  weeks       Prognosis Prognosis for Safe Diet Advancement: Good      Swallow Study   General Date of Onset: 10/03/19 HPI: 54 year old female with acute Left MCA M1 occlusion s/p tPA and mechanical thrombectomy returning to ICU. Intubated 3/8-3/10. Type of Study: Bedside Swallow Evaluation Previous Swallow Assessment: none in chart Diet Prior to this Study: NPO Temperature Spikes Noted: No Respiratory Status: Room air History of Recent Intubation: Yes Length of Intubations (days): 2 days Date extubated: 10/04/19 Behavior/Cognition: Cooperative;Alert Oral Cavity Assessment: Within Functional Limits Oral Care Completed by SLP: No Oral Cavity - Dentition: Adequate natural dentition Vision: Functional for self-feeding Self-Feeding Abilities: Able to feed self Patient Positioning: Upright in bed Baseline Vocal Quality: Hoarse;Breathy Volitional Swallow: Unable to elicit    Oral/Motor/Sensory Function Overall Oral Motor/Sensory Function: Mild impairment Facial ROM: Within Functional Limits Facial Symmetry: Within Functional Limits Facial Strength: Reduced right Facial Sensation: Within Functional Limits Lingual ROM: Within Functional Limits Lingual Symmetry: Within Functional Limits Lingual Strength: Reduced Lingual Sensation: Within Functional Limits Velum: Within Functional Limits Mandible: Within Functional Limits   Ice Chips Ice chips: Impaired Presentation: Spoon Pharyngeal Phase Impairments: Cough - Delayed;Multiple swallows   Thin Liquid Thin Liquid: Impaired Presentation: Spoon;Self Fed Oral Phase Functional Implications: Prolonged oral transit Pharyngeal  Phase Impairments: Suspected delayed Swallow;Multiple swallows;Cough - Delayed;Throat Clearing - Immediate    Nectar Thick Nectar Thick Liquid: Not tested   Honey Thick Honey Thick Liquid: Not tested   Puree Puree: Impaired Presentation: Self Fed Pharyngeal Phase Impairments: Suspected delayed Swallow;Multiple  swallows   Solid     Solid: Impaired Presentation: Spoon Pharyngeal Phase Impairments: Suspected delayed Swallow;Multiple swallows  Aline August, Student SLP Office: [phone]  10/04/2019,4:30 PM

## 2019-10-04 NOTE — Plan of Care (Addendum)
Received a call from Dr. Augusto Gamble, neuroradiology regarding the MRI MRA for the patient that was completed. MRI reveals left MCA infarcts which are patchy most confluent at the posterior caudate and lentiform overall infarct volume probably similar to that predicted by the CT perfusion study obtained yesterday but the MRI of the head shows the left MCA stent may be responsible for the decreased left M1 flow signal but there is decreased visualization of the left MCA branches in the MRI and increased branch flair signal raising possibility of residual hemodynamically significant MCA stenosis. Patient remains intubated and no change in exam. We will repeat CT angiogram in the morning. Maintain SBP <160 (earlier parameters 120-140) given possibility of slow flow in the stent. -- Milon Dikes, MD [facility] Pager: [phone] If 7pm to 7am, please call on call as listed on AMION.

## 2019-10-04 NOTE — Progress Notes (Signed)
STROKE TEAM PROGRESS NOTE   INTERVAL HISTORY RN at bedside. Pt awake alert but mild lethargic, on weaning, plan to extubate today. Right UE and LE continue to improve on strength. MRI showed left MCA infarcts, no HT. MRA and CTA showed left M2 minimal flow beyond stent at superior branch. Will continue ASA and brilinta and permissive hypertension.    Vitals:   10/04/19 0700 10/04/19 0800 10/04/19 0828 10/04/19 0900  BP: (!) 98/48 (!) 95/50 116/61 112/60  Pulse: 73 75 73 74  Resp:  13 (!) 22 14  Temp:  98.8 F (37.1 C)    TempSrc:  Axillary    SpO2: 100% 99% 100% 100%  Weight:      Height:        CBC:  Recent Labs  Lab 10/02/19 1953 10/02/19 1953 10/03/19 0212 10/03/19 0212 10/03/19 0231 10/04/19 0413  WBC 14.1*   < > 11.4*  --   --  15.0*  NEUTROABS 10.2*  --  9.4*  --   --   --   HGB 14.7   < > 13.8   < > 13.6 10.5*  HCT 45.3   < > 40.9   < > 40.0 33.6*  MCV 94.2   < > 92.1  --   --  98.0  PLT 366   < > 356  --   --  290   < > = values in this interval not displayed.    Basic Metabolic Panel:  Recent Labs  Lab 10/03/19 0212 10/03/19 0212 10/03/19 0231 10/04/19 0413  NA 139   < > 139 143  K 4.1   < > 3.8 3.6  CL 108  --   --  112*  CO2 22  --   --  23  GLUCOSE 171*  --   --  110*  BUN 9  --   --  8  CREATININE 0.79  --   --  0.79  CALCIUM 8.9  --   --  8.2*   < > = values in this interval not displayed.   Lipid Panel:     Component Value Date/Time   CHOL 236 (H) 10/03/2019 0212   TRIG 187 (H) 10/03/2019 0555   HDL 49 10/03/2019 0212   CHOLHDL 4.8 10/03/2019 0212   VLDL 35 10/03/2019 0212   LDLCALC 152 (H) 10/03/2019 0212   HgbA1c:  Lab Results  Component Value Date   HGBA1C 5.3 10/03/2019   Urine Drug Screen:     Component Value Date/Time   LABOPIA NONE DETECTED 10/03/2019 0937   COCAINSCRNUR NONE DETECTED 10/03/2019 0937   LABBENZ NONE DETECTED 10/03/2019 0937   AMPHETMU POSITIVE (A) 10/03/2019 0937   THCU NONE DETECTED 10/03/2019 0937    LABBARB NONE DETECTED 10/03/2019 0937    Alcohol Level     Component Value Date/Time   ETH <10 10/02/2019 1953    IMAGING past 24 hours CT ANGIO HEAD W OR WO CONTRAST  Result Date: 10/04/2019 CLINICAL DATA:  Stroke follow-up EXAM: CT ANGIOGRAPHY HEAD TECHNIQUE: Multidetector CT imaging of the head was performed using the standard protocol during bolus administration of intravenous contrast. Multiplanar CT image reconstructions and MIPs were obtained to evaluate the vascular anatomy. CONTRAST:  75mL OMNIPAQUE IOHEXOL 350 MG/ML SOLN COMPARISON:  Brain MRI from yesterday FINDINGS: CT HEAD Brain: Cytotoxic edema from infarct in the left basal ganglia and adjacent white matter, unchanged in extent from prior brain MRI. There is also pre-existing lacunar infarct in the  left frontal white matter. No hemorrhagic conversion or midline shift. Vascular: See below Skull: Negative Sinuses: Patchy chronic appearing sinusitis Orbits: Negative CTA HEAD Anterior circulation: The siphons are symmetrically patent. Y stent at the left M1 to M2 segments with nonocclusive thrombus seen in the lumen of the upper limb. The lower limb is well enhancing. Limited visualization at the margins of the stent due to artifact from radiopaque markers. Hypoplastic right A1 segment. Negative for aneurysm or right-sided embolic disease Posterior circulation: Right dominant vertebral artery. Basilar is smooth and widely patent. No branch occlusion or beading. Negative for aneurysm. Venous sinuses: Diffusely patent Anatomic variants: None significant Call has been placed to the Neurology team. IMPRESSION: 1. Y stenting of the left M1 and M2 segments with nonocclusive in stent thrombus narrowing the upper limb. 2. Acute left MCA territory infarct is unchanged from brain MRI yesterday. Electronically Signed   By: Marnee SpringJonathon  Watts M.D.   On: 10/04/2019 08:21   MR ANGIO HEAD WO CONTRAST  Addendum Date: 10/03/2019   ADDENDUM REPORT: 10/03/2019  23:31 ADDENDUM: Left MCA findings discussed by telephone with Dr. Milon DikesASHISH ARORA on 10/03/2019 at 2320 hours. And we discussed that a repeat CTA may be valuable to further quantify the Left MCA reperfusion. Electronically Signed   By: Odessa FlemingH  Hall M.D.   On: 10/03/2019 23:31   Result Date: 10/03/2019 CLINICAL DATA:  54 year old female status post emergent large vessel occlusion, left M1 thrombus treated with endovascular reperfusion with rescue stenting. EXAM: MRI HEAD WITHOUT CONTRAST MRA HEAD WITHOUT CONTRAST TECHNIQUE: Multiplanar, multiecho pulse sequences of the brain and surrounding structures were obtained without intravenous contrast. Angiographic images of the head were obtained using MRA technique without contrast. COMPARISON:  CTA head and neck, CT Perfusion yesterday. FINDINGS: MRI HEAD FINDINGS Brain: Left MCA territory restricted diffusion, most confluent at the posterior left caudate and left lentiform, with additional scattered small cortical and subcortical areas around the left insula and left inferior frontal gyrus. This is similar to although not exactly corresponding to the predicted core infarct on CTP (19 mL on that exam). Associated T2 and FLAIR hyperintensity with no convincing hemorrhagic transformation. No mass effect. No contralateral right hemisphere or posterior fossa restricted diffusion. Scattered bilateral small and mostly subcortical white matter T2 and FLAIR hyperintense foci. 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 No midline shift, mass effect, evidence of mass lesion, ventriculomegaly, extra-axial collection or acute intracranial hemorrhage. Cervicomedullary junction and pituitary are within normal limits. Vascular: Major intracranial vascular flow voids are preserved. Skull and upper cervical spine: Negative visible cervical spine. Visualized bone marrow signal is within normal limits. Sinuses/Orbits:  Negative orbits. Posterior ethmoid and sphenoid sinus mucosal thickening. Other: Layering fluid in the pharynx. Mastoids remain clear. The patient is intubated with an oral enteric tube also partially visible. MRA HEAD FINDINGS Antegrade flow in the posterior circulation with dominant right vertebral artery. Patent left vertebrobasilar junction. Normal right PICA origin. Patent basilar artery. Patent AICA, SCA and PCA origins. Both posterior communicating arteries are present. Bilateral PCA branches are within normal limits. Antegrade flow in both ICA siphons. No siphon stenosis identified. Ophthalmic and posterior communicating artery origins appear normal. Patent carotid termini. Patent MCA and ACA origins. The left ACA appears dominant. And there is a 2 mm infundibulum versus aneurysm directed superiorly from the anterior communicating artery region on series 2, image 93. But otherwise the visible ACA branches are within normal limits. Right MCA  M1 segment, right MCA bifurcation and visible right MCA branches are within normal limits. The left M1 was stented. The left MCA origin is patent, with loss of most of the left MCA flow signal, although there is flow signal detected at the left MCA bifurcation. However, there is a paucity of left MCA branches when compared to the right side. Furthermore, there is asymmetric FLAIR signal within left MCA M2 and M3 branches on the anatomic images above. IMPRESSION: 1. The Left MCA was stented which is at least partially responsible for the decreased left M1 flow signal. However, decreased visualization of left MCA branches on MRA and increased branch FLAIR signal raises the possibility of residual hemodynamically significant MCA stenosis. 2. MRA is negative for other intracranial stenosis. 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 3. MRI reveals patchy left MCA infarcts most confluent at the posterior caudate and lentiform.  Overall infarct volume probably similar to that predicted on CTP yesterday. 4. No intracranial mass effect or hemorrhagic transformation. 5. Chronic left corona radiata infarct. Electronically Signed: By: Odessa Fleming M.D. On: 10/03/2019 23:18   MR BRAIN WO CONTRAST  Addendum Date: 10/03/2019   ADDENDUM REPORT: 10/03/2019 23:31 ADDENDUM: Left MCA findings discussed by telephone with Dr. Milon Dikes on 10/03/2019 at 2320 hours. And we discussed that a repeat CTA may be valuable to further quantify the Left MCA reperfusion. Electronically Signed   By: Odessa Fleming M.D.   On: 10/03/2019 23:31   Result Date: 10/03/2019 CLINICAL DATA:  54 year old female status post emergent large vessel occlusion, left M1 thrombus treated with endovascular reperfusion with rescue stenting. EXAM: MRI HEAD WITHOUT CONTRAST MRA HEAD WITHOUT CONTRAST TECHNIQUE: Multiplanar, multiecho pulse sequences of the brain and surrounding structures were obtained without intravenous contrast. Angiographic images of the head were obtained using MRA technique without contrast. COMPARISON:  CTA head and neck, CT Perfusion yesterday. FINDINGS: MRI HEAD FINDINGS Brain: Left MCA territory restricted diffusion, most confluent at the posterior left caudate and left lentiform, with additional scattered small cortical and subcortical areas around the left insula and left inferior frontal gyrus. This is similar to although not exactly corresponding to the predicted core infarct on CTP (19 mL on that exam). Associated T2 and FLAIR hyperintensity with no convincing hemorrhagic transformation. No mass effect. No contralateral right hemisphere or posterior fossa restricted diffusion. Scattered bilateral small and mostly subcortical white matter T2 and FLAIR hyperintense foci. 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 No midline shift, mass effect, evidence of mass lesion, ventriculomegaly,  extra-axial collection or acute intracranial hemorrhage. Cervicomedullary junction and pituitary are within normal limits. Vascular: Major intracranial vascular flow voids are preserved. Skull and upper cervical spine: Negative visible cervical spine. Visualized bone marrow signal is within normal limits. Sinuses/Orbits: Negative orbits. Posterior ethmoid and sphenoid sinus mucosal thickening. Other: Layering fluid in the pharynx. Mastoids remain clear. The patient is intubated with an oral enteric tube also partially visible. MRA HEAD FINDINGS Antegrade flow in the posterior circulation with dominant right vertebral artery. Patent left vertebrobasilar junction. Normal right PICA origin. Patent basilar artery. Patent AICA, SCA and PCA origins. Both posterior communicating arteries are present. Bilateral PCA branches are within normal limits. Antegrade flow in both ICA siphons. No siphon stenosis identified. Ophthalmic and posterior communicating artery origins appear normal. Patent carotid termini. Patent MCA and ACA origins. The left ACA appears dominant. And there is a 2  mm infundibulum versus aneurysm directed superiorly from the anterior communicating artery region on series 2, image 93. But otherwise the visible ACA branches are within normal limits. Right MCA M1 segment, right MCA bifurcation and visible right MCA branches are within normal limits. The left M1 was stented. The left MCA origin is patent, with loss of most of the left MCA flow signal, although there is flow signal detected at the left MCA bifurcation. However, there is a paucity of left MCA branches when compared to the right side. Furthermore, there is asymmetric FLAIR signal within left MCA M2 and M3 branches on the anatomic images above. IMPRESSION: 1. The Left MCA was stented which is at least partially responsible for the decreased left M1 flow signal. However, decreased visualization of left MCA branches on MRA and increased branch FLAIR  signal raises the possibility of residual hemodynamically significant MCA stenosis. 2. MRA is negative for other intracranial stenosis. 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 3. MRI reveals patchy left MCA infarcts most confluent at the posterior caudate and lentiform. Overall infarct volume probably similar to that predicted on CTP yesterday. 4. No intracranial mass effect or hemorrhagic transformation. 5. Chronic left corona radiata infarct. Electronically Signed: By: Genevie Ann M.D. On: 10/03/2019 23:18   IR PATIENT EVAL TECH 0-60 MINS  Result Date: 10/03/2019 Karenann Cai     10/03/2019  2:34 PM Right 8 french sheath removal, no hematoma present prior to sheath removal.  Held manual pressure with a Quikclot pad for 25 minutes.  No complications and distal pulses still present.  Verified site with RN and gave instructions on when to remove Quikclot pad.  France Ravens RT R, VI [place] RT R  ECHOCARDIOGRAM COMPLETE  Result Date: 10/03/2019    ECHOCARDIOGRAM REPORT   Patient Name:   Erin Peck [facility] Date of Exam: 10/03/2019 Medical Rec #:  [id number]       Height:       65.0 in Accession #:    [id number]      Weight:       163.4 lb Date of Birth:  January 16, 1966      BSA:          1.815 m Patient Age:    55 years        BP:           116/62 mmHg Patient Gender: F               HR:           93 bpm. Exam Location:  Inpatient Procedure: 2D Echo, Cardiac Doppler and Color Doppler Indications:   CVA  History:       Patient has no prior history of Echocardiogram examinations.                Stroke.  Sonographer:   Mikki Santee RDCS (AE) Referring      [id number] ASHISH ARORA Phys: IMPRESSIONS  1. Left ventricular ejection fraction, by estimation, is 60 to 65%. The left ventricle has normal function. The left ventricle has no regional wall motion abnormalities. Left ventricular diastolic parameters were normal.  2. Right ventricular systolic function is normal. The right  ventricular size is normal.  3. The mitral valve is normal in structure. No evidence of mitral valve regurgitation. No evidence of mitral stenosis.  4. The aortic valve is normal in structure. Aortic valve regurgitation is not visualized. No aortic stenosis is  present.  5. The inferior vena cava is normal in size with greater than 50% respiratory variability, suggesting right atrial pressure of 3 mmHg. Comparison(s): No prior Echocardiogram. FINDINGS  Left Ventricle: Left ventricular ejection fraction, by estimation, is 60 to 65%. The left ventricle has normal function. The left ventricle has no regional wall motion abnormalities. The left ventricular internal cavity size was normal in size. There is  no left ventricular hypertrophy. Left ventricular diastolic parameters were normal. Normal left ventricular filling pressure. Right Ventricle: The right ventricular size is normal. No increase in right ventricular wall thickness. Right ventricular systolic function is normal. Left Atrium: Left atrial size was normal in size. Right Atrium: Right atrial size was normal in size. Pericardium: There is no evidence of pericardial effusion. Mitral Valve: The mitral valve is normal in structure. Normal mobility of the mitral valve leaflets. No evidence of mitral valve regurgitation. No evidence of mitral valve stenosis. Tricuspid Valve: The tricuspid valve is normal in structure. Tricuspid valve regurgitation is not demonstrated. No evidence of tricuspid stenosis. Aortic Valve: The aortic valve is normal in structure. Aortic valve regurgitation is not visualized. No aortic stenosis is present. Pulmonic Valve: The pulmonic valve was normal in structure. Pulmonic valve regurgitation is not visualized. No evidence of pulmonic stenosis. Aorta: The aortic root is normal in size and structure. Venous: The inferior vena cava is normal in size with greater than 50% respiratory variability, suggesting right atrial pressure of 3 mmHg.  IAS/Shunts: No atrial level shunt detected by color flow Doppler.  LEFT VENTRICLE PLAX 2D LVIDd:         3.86 cm  Diastology LVIDs:         2.44 cm  LV e' lateral:   9.36 cm/s LV PW:         0.78 cm  LV E/e' lateral: 9.3 LV IVS:        0.90 cm  LV e' medial:    8.59 cm/s LVOT diam:     2.00 cm  LV E/e' medial:  10.1 LV SV:         76 LV SV Index:   42 LVOT Area:     3.14 cm  RIGHT VENTRICLE RV S prime:     15.80 cm/s TAPSE (M-mode): 2.5 cm LEFT ATRIUM             Index       RIGHT ATRIUM          Index LA diam:        3.20 cm 1.76 cm/m  RA Area:     9.71 cm LA Vol (A2C):   26.7 ml 14.71 ml/m RA Volume:   17.60 ml 9.70 ml/m LA Vol (A4C):   33.7 ml 18.57 ml/m LA Biplane Vol: 31.1 ml 17.13 ml/m  AORTIC VALVE LVOT Vmax:   110.00 cm/s LVOT Vmean:  74.700 cm/s LVOT VTI:    0.241 m  AORTA Ao Root diam: 2.60 cm MITRAL VALVE MV Area (PHT): 3.37 cm    SHUNTS MV Decel Time: 225 msec    Systemic VTI:  0.24 m MV E velocity: 86.90 cm/s  Systemic Diam: 2.00 cm MV A velocity: 88.00 cm/s MV E/A ratio:  0.99 Mihai Croitoru MD Electronically signed by Thurmon Fair MD Signature Date/Time: 10/03/2019/3:15:50 PM    Final    Cerebral Angio S/P Lt common carotid arteriogram followed by revascularization of occluded Lt MCA M1 occlusion with x 2 passes with 40mm x 40 mm solitaire X retriever  and x 1 pass with solitaire X 6 mm x 40 mm retriever and penumbra aspiration achieving a TICI 2C revascularization with rescue stenting of reocclusion  With Y stenting  Into the sup and inf divisions achievin a TICI 2C  revascularization, Occlusion of  of sup division stent with slow revascularization following use of 6 mg of IA integrelin.Marland Kitchen  PHYSICAL EXAM   Temp:  [97 F (36.1 C)-99.5 F (37.5 C)] 98.8 F (37.1 C) (03/10 0800) Pulse Rate:  [72-106] 74 (03/10 0900) Resp:  [11-29] 14 (03/10 0900) BP: (95-156)/(48-76) 112/60 (03/10 0900) SpO2:  [97 %-100 %] 100 % (03/10 0900) Arterial Line BP: (106-150)/(53-105) 135/70 (03/10  0900) FiO2 (%):  [40 %] 40 % (03/10 0828)  General - Well nourished, well developed, intubated on propofol.  Ophthalmologic - fundi not visualized due to noncooperation.  Cardiovascular - Regular rate and rhythm.  Neuro - intubated on propofol, eyes close but easily open on voice, following simple commands. With eye opening, eyes bilateral gaze although more left perference, blinking to visual threat bilaterally, tracking bilaterally. PERRL. Corneal reflex present, gag and cough present. Breathing over the vent, on weaning.  Facial symmetry not able to test due to ET tube.  Tongue protrusion midline. Spontaneous moving LUE, about 4/5. RUE 3/5. LLE at least 3/5 proximal and 4+/5 distally. RLE proximal 3/5 proximal and 3-/5 distall DF/PF. DTR 1+ and no babinski. Sensation symmetrical subjectively, coordination not cooperative and gait not tested.   ASSESSMENT/PLAN Erin Peck is a 54 y.o. female with history of complex migraines who does not often see a doctor presenting to [facility] ED with R sided hemiparesis and speech deficits. Received tPA 10/03/2019 at 2016 at [facility]. Transferred to [facility] for IR.   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 Chronic lacune L corona radiata. ASPECTS 10.     CTA head & neck ELVO L M1/L MCA bifurcation. Proximal R ICA 50% stenosis. R VA origin 50% stenosis. L VA hypoplastic/occlusion. emphysema  CT perfusion positive penumbra. Mismatch 75cc.  Cerebral angio L M1 occlusion w/ TICI2c revascularization w/ rescue Y stenting into superior and inferior divisions; superior division stent occlusion w/ revascularization using integrelin. Later treated w/ Aggrastat. Sheath out 3/9  MRI  Patchy L MCA infarcts posterior caudate and lentiform. Old L corona radiata infarct  MRA   L  MCA stent w/ decreased L M1 flow and poor visualization of branches, possible residual L MCA stenosis. 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  CTA head repeat - left M2 superior branch nonocclusive in-stent thrombusis with minimal distal flow beyond stent, however, collateral flow adequate.   LE Doppler  pending   2D Echo EF 60-65%. No source of embolus   Plan TEE once stable  P2Y12 3/11 pending   LDL 152  HgbA1c 5.3  SCDs for VTE prophylaxis  No antithrombotic prior to admission, now on aspirin 81 mg daily and Brilinta (ticagrelor) 90 mg bid. Continue DAPT at d/c.   Therapy recommendations:  pending   Disposition:  pending   Acute Respiratory Failure  Intubated for IR, left intubated following IR  On sedation   On weaning   Hope for extubation today  CCM on board  Hypertensive Emergency  Home meds:  No hx HTN, not on home meds  BP as high  as 217/110 on arrival . Post IR BP goal 120-140 . Placed on Cleviprex, now off  . Permissive hypertension given left M2 minimal flow beyond stent. . BP goal 130-160 for now  Hyperlipidemia  Home meds:  No statin  LDL 152, goal < 70  lipitor 80 added  Continue statin at discharge  Dysphagia . Secondary to stroke . NPO . Swallow eval once extubated  . Speech on board  Other Stroke Risk Factors  Family hx stroke (mother and father, both in their 48s)  Hx complex Migraines  Other Active Problems  Leukocytosis 14.1->11.4->15.0, afebrile   Acute blood loss anemia 13.8->10.5  Hospital day # 2  This patient is critically ill due to stroke, MCA occlusion, s/p tPA and IR and at significant risk of neurological worsening, death form recurrent stroke, hemorrhagic conversion, cerebral edema, seizure. This patient's care requires constant monitoring of vital signs, hemodynamics, respiratory and cardiac monitoring, review of multiple databases, neurological assessment, discussion with family, other specialists and medical  decision making of high complexity. I spent 35 minutes of neurocritical care time in the care of this patient. I had long discussion with daughter at bedside, updated pt current condition, treatment plan and potential prognosis, and answered all the questions. She expressed understanding and appreciation. I also discussed with Dr. Delton Coombes.   Marvel Plan, MD PhD Stroke Neurology 10/04/2019 9:49 AM  To contact Stroke Continuity provider, please refer to [email or web address]. After hours, contact General Neurology

## 2019-10-04 NOTE — Progress Notes (Signed)
SLP Cancellation Note  Patient Details Name: Erin Peck MRN: [id number] DOB: 06-Nov-1965   Cancelled treatment:       Reason Eval/Treat Not Completed: Patient not medically ready, still on vent. Will continue to follow.    Mahala Menghini., M.A. CCC-SLP Acute Rehabilitation Services Pager [phone] Office [phone]  10/04/2019, 8:07 AM

## 2019-10-04 NOTE — Progress Notes (Signed)
Referring Physician(s): Code Stroke- Milon Dikes  Supervising Physician: Julieanne Cotton  Patient Status:  [facility] - In-pt  Chief Complaint: None- intubated with sedation  Subjective:  History of acute CVA s/p cerebral arteriogram with emergent mechanical thrombectomy of left MCA M1 occlusion, along with rescue Y stenting of left MCA M1 superior and inferior division reocclusion achieving a TICI 2C revascularization 10/02/2019 by Dr. Corliss Skains. Patient laying in bed intubated with sedation. She opens eyes to voice/name and follows simple commands. Can spontaneously move all extremities but demonstrates right pronator drift. Right groin incision c/d/i.  MRI/MRA brain/head 10/03/2019: 1. The Left MCA was stented which is at least partially responsible for the decreased left M1 flow signal. However, decreased visualization of left MCA branches on MRA and increased branch FLAIR signal raises the possibility of residual hemodynamically significant MCA stenosis. 2. MRA is negative for other intracranial stenosis. 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 3. MRI reveals patchy left MCA infarcts most confluent at the posterior caudate and lentiform. Overall infarct volume probably similar to that predicted on CTP yesterday. 4. No intracranial mass effect or hemorrhagic transformation. 5. Chronic left corona radiata infarct.  CTA head/neck 10/04/2019: 1. Y stenting of the left M1 and M2 segments with nonocclusive in stent thrombus narrowing the upper limb. 2. Acute left MCA territory infarct is unchanged from brain MRI yesterday.   Allergies: Patient has no known allergies.  Medications: Prior to Admission medications   Medication Sig Start Date End Date Taking? Authorizing Provider  ibuprofen (ADVIL) 200 MG tablet Take 200 mg by mouth every 6 (six) hours as needed for mild pain or moderate pain.   Yes [provider]     Vital  Signs: BP 116/61   Pulse 73   Temp 98.8 F (37.1 C) (Axillary)   Resp (!) 22   Ht 5\' 5"  (1.651 m)   Wt 163 lb 5.8 oz (74.1 kg)   SpO2 100%   BMI 27.18 kg/m   Physical Exam Vitals and nursing note reviewed.  Constitutional:      General: She is not in acute distress.    Comments: Intubated with sedation.  Pulmonary:     Effort: Pulmonary effort is normal. No respiratory distress.     Comments: Intubated with sedation. Skin:    General: Skin is warm and dry.     Comments: Right groin incision soft without active bleeding or hematoma.  Neurological:     Comments: Intubated with sedation. She opens eyes to voice/name and follows simple commands. PERRL bilaterally. Demonstrates left gaze preference but able to cross midline. Can spontaneously move all extremities. Demonstrates right pronator drift. Distal pulses Dopplerable bilaterally (DPs).   Psychiatric:     Comments: Intubated with sedation.     Imaging: CT ANGIO HEAD W OR WO CONTRAST  Result Date: 10/04/2019 CLINICAL DATA:  Stroke follow-up EXAM: CT ANGIOGRAPHY HEAD TECHNIQUE: Multidetector CT imaging of the head was performed using the standard protocol during bolus administration of intravenous contrast. Multiplanar CT image reconstructions and MIPs were obtained to evaluate the vascular anatomy. CONTRAST:  54mL OMNIPAQUE IOHEXOL 350 MG/ML SOLN COMPARISON:  Brain MRI from yesterday FINDINGS: CT HEAD Brain: Cytotoxic edema from infarct in the left basal ganglia and adjacent white matter, unchanged in extent from prior brain MRI. There is also pre-existing lacunar infarct in the left frontal white matter. No hemorrhagic conversion or midline shift. Vascular: See below Skull: Negative Sinuses: Patchy chronic appearing  sinusitis Orbits: Negative CTA HEAD Anterior circulation: The siphons are symmetrically patent. Y stent at the left M1 to M2 segments with nonocclusive thrombus seen in the lumen of the upper limb. The lower limb  is well enhancing. Limited visualization at the margins of the stent due to artifact from radiopaque markers. Hypoplastic right A1 segment. Negative for aneurysm or right-sided embolic disease Posterior circulation: Right dominant vertebral artery. Basilar is smooth and widely patent. No branch occlusion or beading. Negative for aneurysm. Venous sinuses: Diffusely patent Anatomic variants: None significant Call has been placed to the Neurology team. IMPRESSION: 1. Y stenting of the left M1 and M2 segments with nonocclusive in stent thrombus narrowing the upper limb. 2. Acute left MCA territory infarct is unchanged from brain MRI yesterday. Electronically Signed   By: Marnee Spring M.D.   On: 10/04/2019 08:21   CT ANGIO HEAD W OR WO CONTRAST  Result Date: 10/02/2019 CLINICAL DATA:  Initial evaluation for acute headache, right-sided weakness. EXAM: CT ANGIOGRAPHY HEAD AND NECK CT PERFUSION BRAIN TECHNIQUE: Multidetector CT imaging of the head and neck was performed using the standard protocol during bolus administration of intravenous contrast. Multiplanar CT image reconstructions and MIPs were obtained to evaluate the vascular anatomy. Carotid stenosis measurements (when applicable) are obtained utilizing NASCET criteria, using the distal internal carotid diameter as the denominator. Multiphase CT imaging of the brain was performed following IV bolus contrast injection. Subsequent parametric perfusion maps were calculated using RAPID software. CONTRAST:  OMNIPAQUE IOHEXOL 350 MG/ML SOLN COMPARISON:  Prior head CT from earlier same day. FINDINGS: CTA NECK FINDINGS Aortic arch: Visualized aortic arch normal in caliber. Bovine arch with common origin of the right brachiocephalic and left common carotid artery noted. Mild-to-moderate atheromatous plaque about the arch and origin of the great vessels without hemodynamically significant stenosis. Visualized subclavian arteries widely patent. Right carotid  system: Right common carotid artery patent from its origin to the bifurcation without stenosis. Scattered eccentric calcified plaque about the right bifurcation/proximal right ICA with stenosis of up to approximately 50% by NASCET criteria. Right ICA otherwise widely patent distally to the skull base without stenosis, dissection, or occlusion. Left carotid system: Calcified plaque at the origin of the left CCA without high-grade stenosis. Left CCA otherwise patent to the bifurcation without stenosis. Mild scattered calcified plaque about the left bifurcation/proximal left ICA without significant stenosis. Left ICA widely patent distally to the skull base without stenosis, dissection or occlusion. Vertebral arteries: Both vertebral arteries arise from the subclavian arteries. Right vertebral artery dominant with a diffusely hypoplastic left vertebral artery. Focal atheromatous plaque at the origin of the right vertebral artery with approximate 50% stenosis. Right vertebral artery otherwise widely patent within the neck. 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 Distal reconstitution via muscular branches at the left V3 segment (series 7, image 158). Left vertebral is patent as it courses into the cranial vault. Skeleton: No acute osseous abnormality. No discrete or worrisome osseous lesions. Other neck: No other acute soft tissue abnormality within the neck. No adenopathy. Subcentimeter hypodense nodule noted within the right thyroid lobe, of doubtful significance given size and patient age. No follow-up imaging recommended regarding this lesion. Upper chest: Visualized upper chest demonstrates no acute finding. Partially visualized lungs are clear. Paraseptal and centrilobular emphysematous changes noted at the lung apices. Review of the MIP images confirms the above findings CTA HEAD FINDINGS Anterior circulation: Petrous segments widely patent bilaterally. Cavernous and supraclinoid ICAs patent  without flow-limiting stenosis. A1  segments patent bilaterally. Right A1 hypoplastic, accounting for the slightly diminutive right ICA is compared to the left. Normal anterior communicating artery. Anterior cerebral arteries widely patent to their distal aspects. Right M1 widely patent. Normal right MCA bifurcation. Distal right MCA branches well perfused. Left M1 patent proximally. There is occlusive thrombus within the distal left M1 segment/left MCA bifurcation (series 7, image 94). Fairly good perfusion seen within the left MCA branches distally, which could be related to subocclusive thrombus and/or collateralization. Posterior circulation: Vertebral arteries patent to the vertebrobasilar junction without stenosis. Neither PICA of visualized. Basilar widely patent to its distal aspect without stenosis. Superior cerebral arteries patent bilaterally. Both PCAs primarily supplied via the basilar and are well perfused or distal aspects. Small hypoplastic posterior communicating arteries noted bilaterally. Venous sinuses: Patent. Anatomic variants: None significant.  No aneurysm. Review of the MIP images confirms the above findings CT Brain Perfusion Findings: ASPECTS: 10 CBF (<30%) Volume: 19mL Perfusion (Tmax>6.0s) volume: 94mL Mismatch Volume: 75mL Infarction Location:Patchy acute core infarct seen involving the cortical and subcortical aspect of the mid and posterior left frontal region. Moderate sized surrounding ischemic penumbra. IMPRESSION: CTA HEAD AND NECK IMPRESSION: 1. Positive CTA for emergent large vessel occlusion, with occlusive thrombus involving the distal left M1 segment/left MCA bifurcation. Fairly robust perfusion within the distal left MCA branches distally, could be related to subocclusive thrombus and/or collateralization. 2. 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 3. 50% stenosis at the origin of the dominant right vertebral artery. 4. Hypoplastic left vertebral artery,  occluded within the neck, with distal reconstitution by the skull base. 5.  Emphysema (ICD10-J43.9). CT PERFUSION IMPRESSION: 19 cc acute core infarct involving the mid and posterior left frontal region, left MCA distribution. 94 cc surrounding ischemic penumbra, mismatch volume 75 cc. Critical Value/emergent results were called by telephone at the time of interpretation on 10/02/2019 at 9:05 pm to provider Dr. Deretha Emory, who verbally acknowledged these results. Electronically Signed   By: Rise Mu M.D.   On: 10/02/2019 21:31   CT ANGIO NECK W OR WO CONTRAST  Result Date: 10/02/2019 CLINICAL DATA:  Initial evaluation for acute headache, right-sided weakness. EXAM: CT ANGIOGRAPHY HEAD AND NECK CT PERFUSION BRAIN TECHNIQUE: Multidetector CT imaging of the head and neck was performed using the standard protocol during bolus administration of intravenous contrast. Multiplanar CT image reconstructions and MIPs were obtained to evaluate the vascular anatomy. Carotid stenosis measurements (when applicable) are obtained utilizing NASCET criteria, using the distal internal carotid diameter as the denominator. Multiphase CT imaging of the brain was performed following IV bolus contrast injection. Subsequent parametric perfusion maps were calculated using RAPID software. CONTRAST:  OMNIPAQUE IOHEXOL 350 MG/ML SOLN COMPARISON:  Prior head CT from earlier same day. FINDINGS: CTA NECK FINDINGS Aortic arch: Visualized aortic arch normal in caliber. Bovine arch with common origin of the right brachiocephalic and left common carotid artery noted. Mild-to-moderate atheromatous plaque about the arch and origin of the great vessels without hemodynamically significant stenosis. Visualized subclavian arteries widely patent. Right carotid system: Right common carotid artery patent from its origin to the bifurcation without stenosis. Scattered eccentric calcified plaque about the right bifurcation/proximal right ICA with  stenosis of up to approximately 50% by NASCET criteria. Right ICA otherwise widely patent distally to the skull base without stenosis, dissection, or occlusion. Left carotid system: Calcified plaque at the origin of the left CCA without high-grade stenosis. Left CCA otherwise patent to the bifurcation without stenosis. Mild scattered calcified plaque  about the left bifurcation/proximal left ICA without significant stenosis. Left ICA widely patent distally to the skull base without stenosis, dissection or occlusion. Vertebral arteries: Both vertebral arteries arise from the subclavian arteries. Right vertebral artery dominant with a diffusely hypoplastic left vertebral artery. Focal atheromatous plaque at the origin of the right vertebral artery with approximate 50% stenosis. Right vertebral artery otherwise widely patent within the neck. 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 Distal reconstitution via muscular branches at the left V3 segment (series 7, image 158). Left vertebral is patent as it courses into the cranial vault. Skeleton: No acute osseous abnormality. No discrete or worrisome osseous lesions. Other neck: No other acute soft tissue abnormality within the neck. No adenopathy. Subcentimeter hypodense nodule noted within the right thyroid lobe, of doubtful significance given size and patient age. No follow-up imaging recommended regarding this lesion. Upper chest: Visualized upper chest demonstrates no acute finding. Partially visualized lungs are clear. Paraseptal and centrilobular emphysematous changes noted at the lung apices. Review of the MIP images confirms the above findings CTA HEAD FINDINGS Anterior circulation: Petrous segments widely patent bilaterally. Cavernous and supraclinoid ICAs patent without flow-limiting stenosis. A1 segments patent bilaterally. Right A1 hypoplastic, accounting for the slightly diminutive right ICA is compared to the left. Normal anterior  communicating artery. Anterior cerebral arteries widely patent to their distal aspects. Right M1 widely patent. Normal right MCA bifurcation. Distal right MCA branches well perfused. Left M1 patent proximally. There is occlusive thrombus within the distal left M1 segment/left MCA bifurcation (series 7, image 94). Fairly good perfusion seen within the left MCA branches distally, which could be related to subocclusive thrombus and/or collateralization. Posterior circulation: Vertebral arteries patent to the vertebrobasilar junction without stenosis. Neither PICA of visualized. Basilar widely patent to its distal aspect without stenosis. Superior cerebral arteries patent bilaterally. Both PCAs primarily supplied via the basilar and are well perfused or distal aspects. Small hypoplastic posterior communicating arteries noted bilaterally. Venous sinuses: Patent. Anatomic variants: None significant.  No aneurysm. Review of the MIP images confirms the above findings CT Brain Perfusion Findings: ASPECTS: 10 CBF (<30%) Volume: 69mL Perfusion (Tmax>6.0s) volume: 84mL Mismatch Volume: 51mL Infarction Location:Patchy acute core infarct seen involving the cortical and subcortical aspect of the mid and posterior left frontal region. Moderate sized surrounding ischemic penumbra. IMPRESSION: CTA HEAD AND NECK IMPRESSION: 1. Positive CTA for emergent large vessel occlusion, with occlusive thrombus involving the distal left M1 segment/left MCA bifurcation. Fairly robust perfusion within the distal left MCA branches distally, could be related to subocclusive thrombus and/or collateralization. 2. 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 3. 50% stenosis at the origin of the dominant right vertebral artery. 4. Hypoplastic left vertebral artery, occluded within the neck, with distal reconstitution by the skull base. 5.  Emphysema (ICD10-J43.9). CT PERFUSION IMPRESSION: 19 cc acute core infarct involving the mid and  posterior left frontal region, left MCA distribution. 94 cc surrounding ischemic penumbra, mismatch volume 75 cc. Critical Value/emergent results were called by telephone at the time of interpretation on 10/02/2019 at 9:05 pm to provider Dr. Deretha Emory, who verbally acknowledged these results. Electronically Signed   By: Rise Mu M.D.   On: 10/02/2019 21:31   MR ANGIO HEAD WO CONTRAST  Addendum Date: 10/03/2019   ADDENDUM REPORT: 10/03/2019 23:31 ADDENDUM: Left MCA findings discussed by telephone with Dr. Milon Dikes on 10/03/2019 at 2320 hours. And we discussed that a repeat CTA may be valuable to further quantify the Left MCA reperfusion.  Electronically Signed   By: Odessa Fleming M.D.   On: 10/03/2019 23:31   Result Date: 10/03/2019 CLINICAL DATA:  54 year old female status post emergent large vessel occlusion, left M1 thrombus treated with endovascular reperfusion with rescue stenting. EXAM: MRI HEAD WITHOUT CONTRAST MRA HEAD WITHOUT CONTRAST TECHNIQUE: Multiplanar, multiecho pulse sequences of the brain and surrounding structures were obtained without intravenous contrast. Angiographic images of the head were obtained using MRA technique without contrast. COMPARISON:  CTA head and neck, CT Perfusion yesterday. FINDINGS: MRI HEAD FINDINGS Brain: Left MCA territory restricted diffusion, most confluent at the posterior left caudate and left lentiform, with additional scattered small cortical and subcortical areas around the left insula and left inferior frontal gyrus. This is similar to although not exactly corresponding to the predicted core infarct on CTP (19 mL on that exam). Associated T2 and FLAIR hyperintensity with no convincing hemorrhagic transformation. No mass effect. No contralateral right hemisphere or posterior fossa restricted diffusion. Scattered bilateral small and mostly subcortical white matter T2 and FLAIR hyperintense foci. No chronic cortical encephalomalacia identified, but there is  evidence of a chronic left corona radiata lacunar infarct. No midline shift, mass effect, evidence of mass lesion, ventriculomegaly, extra-axial collection or acute intracranial hemorrhage. Cervicomedullary junction and pituitary are within normal limits. Vascular: Major intracranial vascular flow voids are preserved. Skull and upper cervical spine: Negative visible cervical spine. Visualized bone marrow signal is within normal limits. Sinuses/Orbits: Negative orbits. Posterior ethmoid and sphenoid sinus mucosal thickening. Other: Layering fluid in the pharynx. Mastoids remain clear. The patient is intubated with an oral enteric tube also partially visible. MRA HEAD FINDINGS Antegrade flow in the posterior circulation with dominant right vertebral artery. Patent left vertebrobasilar junction. Normal right PICA origin. Patent basilar artery. Patent AICA, SCA and PCA origins. Both posterior communicating arteries are present. Bilateral PCA branches are within normal limits. Antegrade flow in both ICA siphons. No siphon stenosis identified. Ophthalmic and posterior communicating artery origins appear normal. Patent carotid termini. Patent MCA and ACA origins. The left ACA appears dominant. And there is a 2 mm infundibulum versus aneurysm directed superiorly from the anterior communicating artery region on series 2, image 93. But otherwise the visible ACA branches are within normal limits. Right MCA M1 segment, right MCA bifurcation and visible right MCA branches are within normal limits. The left M1 was stented. The left MCA origin is patent, with loss of most of the left MCA flow signal, although there is flow signal detected at the left MCA bifurcation. However, there is a paucity of left MCA branches when compared to the right side. Furthermore, there is asymmetric FLAIR signal within left MCA M2 and M3 branches on the anatomic images above. IMPRESSION: 1. The Left MCA was stented which is at least partially  responsible for the decreased left M1 flow signal. However, decreased visualization of left MCA branches on MRA and increased branch FLAIR signal raises the possibility of residual hemodynamically significant MCA stenosis. 2. MRA is negative for other intracranial stenosis. 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 3. MRI reveals patchy left MCA infarcts most confluent at the posterior caudate and lentiform. Overall infarct volume probably similar to that predicted on CTP yesterday. 4. No intracranial mass effect or hemorrhagic transformation. 5. Chronic left corona radiata infarct. Electronically Signed: By: Odessa Fleming M.D. On: 10/03/2019 23:18   MR BRAIN WO CONTRAST  Addendum Date: 10/03/2019   ADDENDUM REPORT: 10/03/2019 23:31 ADDENDUM: Left MCA findings  discussed by telephone with Dr. Milon Dikes on 10/03/2019 at 2320 hours. And we discussed that a repeat CTA may be valuable to further quantify the Left MCA reperfusion. Electronically Signed   By: Odessa Fleming M.D.   On: 10/03/2019 23:31   Result Date: 10/03/2019 CLINICAL DATA:  54 year old female status post emergent large vessel occlusion, left M1 thrombus treated with endovascular reperfusion with rescue stenting. EXAM: MRI HEAD WITHOUT CONTRAST MRA HEAD WITHOUT CONTRAST TECHNIQUE: Multiplanar, multiecho pulse sequences of the brain and surrounding structures were obtained without intravenous contrast. Angiographic images of the head were obtained using MRA technique without contrast. COMPARISON:  CTA head and neck, CT Perfusion yesterday. FINDINGS: MRI HEAD FINDINGS Brain: Left MCA territory restricted diffusion, most confluent at the posterior left caudate and left lentiform, with additional scattered small cortical and subcortical areas around the left insula and left inferior frontal gyrus. This is similar to although not exactly corresponding to the predicted core infarct on CTP (19 mL on that exam). Associated T2  and FLAIR hyperintensity with no convincing hemorrhagic transformation. No mass effect. No contralateral right hemisphere or posterior fossa restricted diffusion. Scattered bilateral small and mostly subcortical white matter T2 and FLAIR hyperintense foci. 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 No midline shift, mass effect, evidence of mass lesion, ventriculomegaly, extra-axial collection or acute intracranial hemorrhage. Cervicomedullary junction and pituitary are within normal limits. Vascular: Major intracranial vascular flow voids are preserved. Skull and upper cervical spine: Negative visible cervical spine. Visualized bone marrow signal is within normal limits. Sinuses/Orbits: Negative orbits. Posterior ethmoid and sphenoid sinus mucosal thickening. Other: Layering fluid in the pharynx. Mastoids remain clear. The patient is intubated with an oral enteric tube also partially visible. MRA HEAD FINDINGS Antegrade flow in the posterior circulation with dominant right vertebral artery. Patent left vertebrobasilar junction. Normal right PICA origin. Patent basilar artery. Patent AICA, SCA and PCA origins. Both posterior communicating arteries are present. Bilateral PCA branches are within normal limits. Antegrade flow in both ICA siphons. No siphon stenosis identified. Ophthalmic and posterior communicating artery origins appear normal. Patent carotid termini. Patent MCA and ACA origins. The left ACA appears dominant. And there is a 2 mm infundibulum versus aneurysm directed superiorly from the anterior communicating artery region on series 2, image 93. But otherwise the visible ACA branches are within normal limits. Right MCA M1 segment, right MCA bifurcation and visible right MCA branches are within normal limits. The left M1 was stented. The left MCA origin is patent, with loss of most of the left MCA flow signal, although there is  flow signal detected at the left MCA bifurcation. However, there is a paucity of left MCA branches when compared to the right side. Furthermore, there is asymmetric FLAIR signal within left MCA M2 and M3 branches on the anatomic images above. IMPRESSION: 1. The Left MCA was stented which is at least partially responsible for the decreased left M1 flow signal. However, decreased visualization of left MCA branches on MRA and increased branch FLAIR signal raises the possibility of residual hemodynamically significant MCA stenosis. 2. MRA is negative for other intracranial stenosis. 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 3. MRI reveals patchy left MCA infarcts most confluent at the posterior caudate and lentiform. Overall infarct volume probably similar to that predicted on CTP yesterday. 4. No intracranial mass effect or hemorrhagic transformation. 5. Chronic left corona radiata infarct. Electronically  Signed: By: Odessa Fleming M.D. On: 10/03/2019 23:18   CT CEREBRAL PERFUSION W CONTRAST  Result Date: 10/02/2019 CLINICAL DATA:  Initial evaluation for acute headache, right-sided weakness. EXAM: CT ANGIOGRAPHY HEAD AND NECK CT PERFUSION BRAIN TECHNIQUE: Multidetector CT imaging of the head and neck was performed using the standard protocol during bolus administration of intravenous contrast. Multiplanar CT image reconstructions and MIPs were obtained to evaluate the vascular anatomy. Carotid stenosis measurements (when applicable) are obtained utilizing NASCET criteria, using the distal internal carotid diameter as the denominator. Multiphase CT imaging of the brain was performed following IV bolus contrast injection. Subsequent parametric perfusion maps were calculated using RAPID software. CONTRAST:  OMNIPAQUE IOHEXOL 350 MG/ML SOLN COMPARISON:  Prior head CT from earlier same day. FINDINGS: CTA NECK FINDINGS Aortic arch: Visualized aortic arch normal in caliber.  Bovine arch with common origin of the right brachiocephalic and left common carotid artery noted. Mild-to-moderate atheromatous plaque about the arch and origin of the great vessels without hemodynamically significant stenosis. Visualized subclavian arteries widely patent. Right carotid system: Right common carotid artery patent from its origin to the bifurcation without stenosis. Scattered eccentric calcified plaque about the right bifurcation/proximal right ICA with stenosis of up to approximately 50% by NASCET criteria. Right ICA otherwise widely patent distally to the skull base without stenosis, dissection, or occlusion. Left carotid system: Calcified plaque at the origin of the left CCA without high-grade stenosis. Left CCA otherwise patent to the bifurcation without stenosis. Mild scattered calcified plaque about the left bifurcation/proximal left ICA without significant stenosis. Left ICA widely patent distally to the skull base without stenosis, dissection or occlusion. Vertebral arteries: Both vertebral arteries arise from the subclavian arteries. Right vertebral artery dominant with a diffusely hypoplastic left vertebral artery. Focal atheromatous plaque at the origin of the right vertebral artery with approximate 50% stenosis. Right vertebral artery otherwise widely patent within the neck. 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 Distal reconstitution via muscular branches at the left V3 segment (series 7, image 158). Left vertebral is patent as it courses into the cranial vault. Skeleton: No acute osseous abnormality. No discrete or worrisome osseous lesions. Other neck: No other acute soft tissue abnormality within the neck. No adenopathy. Subcentimeter hypodense nodule noted within the right thyroid lobe, of doubtful significance given size and patient age. No follow-up imaging recommended regarding this lesion. Upper chest: Visualized upper chest demonstrates no acute finding.  Partially visualized lungs are clear. Paraseptal and centrilobular emphysematous changes noted at the lung apices. Review of the MIP images confirms the above findings CTA HEAD FINDINGS Anterior circulation: Petrous segments widely patent bilaterally. Cavernous and supraclinoid ICAs patent without flow-limiting stenosis. A1 segments patent bilaterally. Right A1 hypoplastic, accounting for the slightly diminutive right ICA is compared to the left. Normal anterior communicating artery. Anterior cerebral arteries widely patent to their distal aspects. Right M1 widely patent. Normal right MCA bifurcation. Distal right MCA branches well perfused. Left M1 patent proximally. There is occlusive thrombus within the distal left M1 segment/left MCA bifurcation (series 7, image 94). Fairly good perfusion seen within the left MCA branches distally, which could be related to subocclusive thrombus and/or collateralization. Posterior circulation: Vertebral arteries patent to the vertebrobasilar junction without stenosis. Neither PICA of visualized. Basilar widely patent to its distal aspect without stenosis. Superior cerebral arteries patent bilaterally. Both PCAs primarily supplied via the basilar and are well perfused or distal aspects. Small hypoplastic posterior communicating arteries noted bilaterally. Venous sinuses: Patent. Anatomic variants:  None significant.  No aneurysm. Review of the MIP images confirms the above findings CT Brain Perfusion Findings: ASPECTS: 10 CBF (<30%) Volume: 39mL Perfusion (Tmax>6.0s) volume: 62mL Mismatch Volume: 1mL Infarction Location:Patchy acute core infarct seen involving the cortical and subcortical aspect of the mid and posterior left frontal region. Moderate sized surrounding ischemic penumbra. IMPRESSION: CTA HEAD AND NECK IMPRESSION: 1. Positive CTA for emergent large vessel occlusion, with occlusive thrombus involving the distal left M1 segment/left MCA bifurcation. Fairly robust  perfusion within the distal left MCA branches distally, could be related to subocclusive thrombus and/or collateralization. 2. 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 3. 50% stenosis at the origin of the dominant right vertebral artery. 4. Hypoplastic left vertebral artery, occluded within the neck, with distal reconstitution by the skull base. 5.  Emphysema (ICD10-J43.9). CT PERFUSION IMPRESSION: 19 cc acute core infarct involving the mid and posterior left frontal region, left MCA distribution. 94 cc surrounding ischemic penumbra, mismatch volume 75 cc. Critical Value/emergent results were called by telephone at the time of interpretation on 10/02/2019 at 9:05 pm to provider Dr. Rogene Houston, who verbally acknowledged these results. Electronically Signed   By: Jeannine Boga M.D.   On: 10/02/2019 21:31   DG Chest Port 1 View  Result Date: 10/03/2019 CLINICAL DATA:  54 year old female status post endovascular treatment of left MCA M1 emergent large vessel occlusion. EXAM: PORTABLE CHEST 1 VIEW COMPARISON:  CTA head and neck earlier tonight. FINDINGS: Portable AP supine view at 0141 hours. Endotracheal tube tip in good position between the level the clavicles and carina. Enteric tube courses to the stomach but the side hole is at the level of the distal esophagus. Normal cardiac size and mediastinal contours. Allowing for portable technique the lungs are clear. No acute osseous abnormality identified. Negative visible bowel gas pattern. IMPRESSION: 1. Advance the enteric tube 5 cm to ensure side hole placement within the stomach. 2. Endotracheal tube tip in good position. 3.  No acute cardiopulmonary abnormality. Electronically Signed   By: Genevie Ann M.D.   On: 10/03/2019 01:57   DG Abd Portable 1V  Result Date: 10/03/2019 CLINICAL DATA:  NG tube placement. EXAM: PORTABLE ABDOMEN - 1 VIEW COMPARISON:  Chest x-ray, same date. FINDINGS: The NG tube is in good position with its tip in the body  region the stomach laterally. The proximal port is now well below the GE junction. Contrast noted in both renal collecting systems and ureters. There is also some contrast surrounding the right kidney suggesting a urinary leak or obstruction. Recommend clinical correlation. IMPRESSION: 1. NG tube in good position. 2. Contrast surrounding the right kidney suggesting a urinary leak or obstruction. Recommend clinical correlation. CT abdomen/pelvis may be helpful for further evaluation. Electronically Signed   By: Marijo Sanes M.D.   On: 10/03/2019 07:07   IR PATIENT EVAL TECH 0-60 MINS  Result Date: 10/03/2019 Karenann Cai     10/03/2019  2:34 PM Right 8 french sheath removal, no hematoma present prior to sheath removal.  Held manual pressure with a Quikclot pad for 25 minutes.  No complications and distal pulses still present.  Verified site with RN and gave instructions on when to remove Quikclot pad.  France Ravens RT R, VI Brandy Mullis RT R  ECHOCARDIOGRAM COMPLETE  Result Date: 10/03/2019    ECHOCARDIOGRAM REPORT   Patient Name:   Erin Peck Date of Exam: 10/03/2019 Medical Rec #:  [id number]       Height:  65.0 in Accession #:    [id number]      Weight:       163.4 lb Date of Birth:  1966-06-17      BSA:          1.815 m Patient Age:    53 years        BP:           116/62 mmHg Patient Gender: F               HR:           93 bpm. Exam Location:  Inpatient Procedure: 2D Echo, Cardiac Doppler and Color Doppler Indications:   CVA  History:       Patient has no prior history of Echocardiogram examinations.                Stroke.  Sonographer:   Thurman Coyer RDCS (AE) Referring      [id number] ASHISH ARORA Phys: IMPRESSIONS  1. Left ventricular ejection fraction, by estimation, is 60 to 65%. The left ventricle has normal function. The left ventricle has no regional wall motion abnormalities. Left ventricular diastolic parameters were normal.  2. Right ventricular systolic function is normal. The right  ventricular size is normal.  3. The mitral valve is normal in structure. No evidence of mitral valve regurgitation. No evidence of mitral stenosis.  4. The aortic valve is normal in structure. Aortic valve regurgitation is not visualized. No aortic stenosis is present.  5. The inferior vena cava is normal in size with greater than 50% respiratory variability, suggesting right atrial pressure of 3 mmHg. Comparison(s): No prior Echocardiogram. FINDINGS  Left Ventricle: Left ventricular ejection fraction, by estimation, is 60 to 65%. The left ventricle has normal function. The left ventricle has no regional wall motion abnormalities. The left ventricular internal cavity size was normal in size. There is  no left ventricular hypertrophy. Left ventricular diastolic parameters were normal. Normal left ventricular filling pressure. Right Ventricle: The right ventricular size is normal. No increase in right ventricular wall thickness. Right ventricular systolic function is normal. Left Atrium: Left atrial size was normal in size. Right Atrium: Right atrial size was normal in size. Pericardium: There is no evidence of pericardial effusion. Mitral Valve: The mitral valve is normal in structure. Normal mobility of the mitral valve leaflets. No evidence of mitral valve regurgitation. No evidence of mitral valve stenosis. Tricuspid Valve: The tricuspid valve is normal in structure. Tricuspid valve regurgitation is not demonstrated. No evidence of tricuspid stenosis. Aortic Valve: The aortic valve is normal in structure. Aortic valve regurgitation is not visualized. No aortic stenosis is present. Pulmonic Valve: The pulmonic valve was normal in structure. Pulmonic valve regurgitation is not visualized. No evidence of pulmonic stenosis. Aorta: The aortic root is normal in size and structure. Venous: The inferior vena cava is normal in size with greater than 50% respiratory variability, suggesting right atrial pressure of 3 mmHg.  IAS/Shunts: No atrial level shunt detected by color flow Doppler.  LEFT VENTRICLE PLAX 2D LVIDd:         3.86 cm  Diastology LVIDs:         2.44 cm  LV e' lateral:   9.36 cm/s LV PW:         0.78 cm  LV E/e' lateral: 9.3 LV IVS:        0.90 cm  LV e' medial:    8.59 cm/s LVOT diam:     2.00 cm  LV E/e' medial:  10.1 LV SV:         76 LV SV Index:   42 LVOT Area:     3.14 cm  RIGHT VENTRICLE RV S prime:     15.80 cm/s TAPSE (M-mode): 2.5 cm LEFT ATRIUM             Index       RIGHT ATRIUM          Index LA diam:        3.20 cm 1.76 cm/m  RA Area:     9.71 cm LA Vol (A2C):   26.7 ml 14.71 ml/m RA Volume:   17.60 ml 9.70 ml/m LA Vol (A4C):   33.7 ml 18.57 ml/m LA Biplane Vol: 31.1 ml 17.13 ml/m  AORTIC VALVE LVOT Vmax:   110.00 cm/s LVOT Vmean:  74.700 cm/s LVOT VTI:    0.241 m  AORTA Ao Root diam: 2.60 cm MITRAL VALVE MV Area (PHT): 3.37 cm    SHUNTS MV Decel Time: 225 msec    Systemic VTI:  0.24 m MV E velocity: 86.90 cm/s  Systemic Diam: 2.00 cm MV A velocity: 88.00 cm/s MV E/A ratio:  0.99 Mihai Croitoru MD Electronically signed by Thurmon Fair MD Signature Date/Time: 10/03/2019/3:15:50 PM    Final    CT HEAD CODE STROKE WO CONTRAST  Result Date: 10/02/2019 CLINICAL DATA:  Code stroke. 54 year old female with severe headache and right side weakness since 1750 hours. EXAM: CT HEAD WITHOUT CONTRAST TECHNIQUE: Contiguous axial images were obtained from the base of the skull through the vertex without intravenous contrast. COMPARISON:  None. FINDINGS: Brain: No midline shift, mass effect, or evidence of intracranial mass lesion. No ventriculomegaly. No acute intracranial hemorrhage identified. No cortically based acute infarct identified. Chronic appearing white matter hypodensity at the anterior left corona radiata on series 2, image 16. Elsewhere gray-white matter differentiation is within normal limits. Vascular: No suspicious intracranial vascular hyperdensity. Skull: Negative. Sinuses/Orbits: Clear  aside from some right posterior ethmoid air cell mucosal thickening. Mastoids and tympanic cavities appear clear. Other: Visualized orbits and scalp soft tissues are within normal limits. ASPECTS [facility] Stroke Program Early CT Score) Total score (0-10 with 10 being normal): 10 IMPRESSION: 1. No acute cortically based infarct or acute intracranial hemorrhage identified. ASPECTS 10. 2. 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 Study discussed by telephone with Dr. Vanetta Mulders on 10/02/2019 at 20:18 . Electronically Signed   By: Odessa Fleming M.D.   On: 10/02/2019 20:20    Labs:  CBC: Recent Labs    10/02/19 1953 10/03/19 0212 10/03/19 0231 10/04/19 0413  WBC 14.1* 11.4*  --  15.0*  HGB 14.7 13.8 13.6 10.5*  HCT 45.3 40.9 40.0 33.6*  PLT 366 356  --  290    COAGS: Recent Labs    10/02/19 1953  INR 0.9  APTT 27    BMP: Recent Labs    10/02/19 1953 10/03/19 0212 10/03/19 0231 10/04/19 0413  NA 140 139 139 143  K 4.3 4.1 3.8 3.6  CL 107 108  --  112*  CO2 25 22  --  23  GLUCOSE 113* 171*  --  110*  BUN 13 9  --  8  CALCIUM 9.2 8.9  --  8.2*  CREATININE 0.88 0.79  --  0.79  GFRNONAA >60 >60  --  >60  GFRAA >60 >60  --  >60    LIVER  FUNCTION TESTS: Recent Labs    10/02/19 1953  BILITOT 0.4  AST 17  ALT 19  ALKPHOS 74  PROT 7.4  ALBUMIN 4.5    Assessment and Plan:  History of acute CVA s/p cerebral arteriogram with emergent mechanical thrombectomy of left MCA M1 occlusion, along with rescue Y stenting of left MCA M1 superior and inferior division reocclusion achieving a TICI 2C revascularization 10/02/2019 by Dr. Corliss Skainseveshwar. Patient's condition improved- remains intubated/sedated but opens eyes to voice and follows simple commands, moving all extremities. Right groin incision stable. Continue taking Brilinta 90 mg twice daily and Aspirin 81 mg once daily- will obtain P2Y12 tomorrow (48 hours post-aggrastat  per lab) to check effectiveness of DAPT. Further plans per neurology/CCM- appreciate and agree with management. NIR to follow.   Electronically Signed: Elwin MochaAlexandra Obi Scrima, PA-C 10/04/2019, 8:49 AM   I spent a total of 25 Minutes at the the patient's bedside AND on the patient's hospital floor or unit, greater than 50% of which was counseling/coordinating care for CVA s/p revascularization.

## 2019-10-04 NOTE — Plan of Care (Addendum)
Called by RN that pt less orientated than in the afternoon. Not able to tell her age and city. But the rest of exam the same, following commands. Could be due to sundowning, but her BP also low at 90s to 100s. Will give NS 500 bolus, continue IVF @ 75. Albumin Q6h x 4. Add midodrine 5mg  tid. Continue to monitor. Ideally BP goal 120-160.   , MD PhD Stroke Neurology 10/04/2019 7:03 PM

## 2019-10-04 NOTE — Procedures (Signed)
Extubation Procedure Note  Patient Details:   Name: Erin Peck DOB: July 20, 1966 MRN: [id number]   Airway Documentation:    Vent end date: 10/04/19 Vent end time: 1202   Evaluation  O2 sats: stable throughout Complications: No apparent complications Patient did tolerate procedure well. Bilateral Breath Sounds: Clear, Diminished   Pt extubated per MD order Placed on 2L Venus tolerating well Pt had +cuff leak  Pt able to voice  Cherylin Mylar 10/04/2019, 12:06 PM

## 2019-10-04 NOTE — Progress Notes (Signed)
OT Cancellation Note  Patient Details Name: Erin Peck MRN: [id number] DOB: 12-06-1965   Cancelled Treatment:    Reason Eval/Treat Not Completed: Active bedrest order, pt remains on bedrest today. Will follow up for OT eval as schedule permits and as pt is medically ready.   Marcy Siren, OT Acute Rehabilitation Services Pager [phone] Office [phone]   Orlando Penner 10/04/2019, 3:27 PM

## 2019-10-04 NOTE — Progress Notes (Signed)
PT Cancellation Note  Patient Details Name: Erin Peck MRN: [id number] DOB: 11/04/1965   Cancelled Treatment:    Reason Eval/Treat Not Completed: Active bedrest order - bedrest until tomorrow, will check back then.   Richrd Sox, PT Acute Rehabilitation Services Pager [phone]  Office [phone]    Tyrone Apple D Despina Hidden 10/04/2019, 2:42 PM

## 2019-10-04 NOTE — Progress Notes (Signed)
Pharmacy notified that pyxis was out of Albumin at 2056. RN called to follow up with pharmacy. Pharmacy will be bringing it up per pharmacy. RN will continue to monitor.

## 2019-10-04 NOTE — Progress Notes (Signed)
NAME:  Erin Peck, MRN:  [id number], DOB:  October 15, 1965, LOS: 2 ADMISSION DATE:  10/02/2019, CONSULTATION DATE: 10/03/2019 REFERRING MD:  Dr. Corliss Skains, CHIEF COMPLAINT:  CVA  Brief History   54 year old female with acute Left MCA M1 occlusion s/p tPA and mechanical thrombectomy returning to ICU sedated on mechanical ventilation.   Past Medical History  Migraine HA  Significant Hospital Events   3/8 Admitted/ tPA/ mechanical thrombectomy   Consults:  Neuro IR   Procedures:  3/8 ETT >> 3/8 left radial aline >> 3/8 right femoral sheath >> 3/9  Significant Diagnostic Tests:  3/8 CTH >> 1. No acute cortically based infarct or acute intracranial hemorrhage identified. ASPECTS 10. 2. 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  3/8 CTA head/ neck  >> 1. Positive CTA for emergent large vessel occlusion, with occlusive thrombus involving the distal left M1 segment/left MCA bifurcation. Fairly robust perfusion within the distal left MCA branches distally, could be related to subocclusive thrombus and/or collateralization. 2. 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 3. 50% stenosis at the origin of the dominant right vertebral artery. 4. Hypoplastic left vertebral artery, occluded within the neck, with distal reconstitution by the skull base. 5.  Emphysema   3/8 CT perfusion study >> 19 cc acute core infarct involving the mid and posterior left frontal region, left MCA distribution. 94 cc surrounding ischemic penumbra, mismatch volume 75 cc.  MRI/a brain 3/9 >> stented left MCA appears to be partially responsible for decreased left M1 flow signal, question residual hemodynamically significant MCA stenosis patchy left MCA infarcts, fluid posterior caudate and lentiform, no mass-effect  CT angiogram 3/10 >>   Micro Data:  3/8 SARS 2 / Flu A/ B >> neg  Antimicrobials:  3/8 cefazolin pre op  Interim  history/subjective:  CT angiogram performed this morning 3/10 after MRI 3/9 suggested possible residual obstruction versus effects of stent placement on M1 flow Wakes easily, follows commands, currently on propofol 20, fentanyl 50  Objective   Blood pressure (!) 98/48, pulse 73, temperature 99 F (37.2 C), temperature source Axillary, resp. rate 17, height 5\' 5"  (1.651 m), weight 74.1 kg, SpO2 100 %.    Vent Mode: PRVC FiO2 (%):  [40 %] 40 % Set Rate:  [20 bmp] 20 bmp Vt Set:  [340 mL] 340 mL PEEP:  [5 cmH20] 5 cmH20 Pressure Support:  [12 cmH20] 12 cmH20 Plateau Pressure:  [11 cmH20-18 cmH20] 18 cmH20   Intake/Output Summary (Last 24 hours) at 10/04/2019 0802 Last data filed at 10/04/2019 0600 Gross per 24 hour  Intake 2498.55 ml  Output 2125 ml  Net 373.55 ml   Filed Weights   10/02/19 1952 10/03/19 0130  Weight: 60 kg 74.1 kg    Examination:  General -ill-appearing woman, intubated Eyes -pupils equal, react ENT -ET tube in place, no oral lesions, no secretions Cardiac -regular, distant, no murmur Chest -clear bilaterally, PRVC currently Abdomen -nondistended, positive bowel sounds Extremities -no edema Skin -no rash Neuro -wakes easily to voice on current level of sedation, follows commands on the left, barely able to move proximal on the right   Resolved Hospital Problem list    Assessment & Plan:   Acute hypoxic respiratory failure with compromised airway in setting of CVA. Continue efforts at PSV today.  She may be a candidate for extubation but would like to ensure no further neurology interventions planned based on MRI brain, CTA findings  Left MCA M1 CVA s/p tPA and mechanical thrombectomy requiring rescue stenting. Aspirin, Brilinta as ordered Echocardiogram and lower extremity Dopplers pending Will discuss MRI and CTA findings with neurology and IR 3/10.  Question whether any other neuro interventions indicated.  If not then will proceed with efforts at  ventilator weaning as above  Hypertension. SBP goal 120-140   Best practice:  Diet: Tube feeding on 3/10 if no plans for extubation DVT prophylaxis: SCDs GI prophylaxis: PPI Mobility: BR Code Status: full  Disposition: Neuro ICU  Labs    CMP Latest Ref Rng & Units 10/04/2019 10/03/2019 10/03/2019  Glucose 70 - 99 mg/dL 110(H) - 171(H)  BUN 6 - 20 mg/dL 8 - 9  Creatinine 0.44 - 1.00 mg/dL 0.79 - 0.79  Sodium 135 - 145 mmol/L 143 139 139  Potassium 3.5 - 5.1 mmol/L 3.6 3.8 4.1  Chloride 98 - 111 mmol/L 112(H) - 108  CO2 22 - 32 mmol/L 23 - 22  Calcium 8.9 - 10.3 mg/dL 8.2(L) - 8.9  Total Protein 6.5 - 8.1 g/dL - - -  Total Bilirubin 0.3 - 1.2 mg/dL - - -  Alkaline Phos 38 - 126 U/L - - -  AST 15 - 41 U/L - - -  ALT 0 - 44 U/L - - -    CBC Latest Ref Rng & Units 10/04/2019 10/03/2019 10/03/2019  WBC 4.0 - 10.5 K/uL 15.0(H) - 11.4(H)  Hemoglobin 12.0 - 15.0 g/dL 10.5(L) 13.6 13.8  Hematocrit 36.0 - 46.0 % 33.6(L) 40.0 40.9  Platelets 150 - 400 K/uL 290 - 356    ABG    Component Value Date/Time   PHART 7.336 (L) 10/03/2019 0231   PCO2ART 43.4 10/03/2019 0231   PO2ART 106.0 10/03/2019 0231   HCO3 23.4 10/03/2019 0231   TCO2 25 10/03/2019 0231   ACIDBASEDEF 3.0 (H) 10/03/2019 0231   O2SAT 98.0 10/03/2019 0231    CBG (last 3)  Recent Labs    10/02/19 1955  GLUCAP 124*    CC time 32 minutes  Baltazar Apo, MD, PhD 10/04/2019, 8:09 AM [facility] [phone] or if no answer [phone]

## 2019-10-04 NOTE — TOC Initial Note (Signed)
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  PTA, pt independent, lives at home with spouse.  Met with pt's daughter, Erin Peck, at bedside.  She has questions about obtaining disability/Medicaid for her mother.  Answered all questions to the best of my ability; [name] plans to follow up with [facility]. DSS to start application process.  Daughter states that pt lives with spouse, and has "lots of family to assist her at discharge."  Awaiting PT/OT evaluations once bedrest is lifted.  Will follow progress.                   Expected Discharge Plan: IP Rehab Facility Barriers to Discharge: Continued Medical Work up   Patient Goals and CMS Choice        Expected Discharge Plan and Services Expected Discharge Plan: [place]   Discharge Planning Services: CM Consult   Living arrangements for the past 2 months: Single Family Home                                      Prior Living Arrangements/Services Living arrangements for the past 2 months: Single Family Home Lives with:: Spouse Patient language and need for interpreter reviewed:: Yes Do you feel safe going back to the place where you live?: Yes      Need for Family Participation in Patient Care: Yes (Comment) Care giver support system in place?: Yes (comment)   Criminal Activity/Legal Involvement Pertinent to Current Situation/Hospitalization: No - Comment as needed  Activities of Daily Living Home Assistive Devices/Equipment: Eyeglasses ADL Screening (condition at time of admission) Patient's cognitive ability adequate to safely complete daily activities?: Yes Is the patient deaf or have difficulty hearing?:  No Does the patient have difficulty seeing, even when wearing glasses/contacts?: No Does the patient have difficulty concentrating, remembering, or making decisions?: No Patient able to express need for assistance with ADLs?: Yes Does the patient have difficulty dressing or bathing?: Yes Independently performs ADLs?: No Does the patient have difficulty walking or climbing stairs?: Yes Weakness of Legs: Right Weakness of Arms/Hands: Right  Permission Sought/Granted                  Emotional Assessment Appearance:: Appears stated age Attitude/Demeanor/Rapport: Unable to Assess Affect (typically observed): Frustrated Orientation: : Oriented to Self      Admission diagnosis:  Cerebrovascular accident (CVA), unspecified mechanism ([place]) [I63.9] Acute ischemic stroke ([place]) [I63.9] Acute ischemic left middle cerebral artery (MCA) stroke ([place]) [P10.315] Patient Active Problem List   Diagnosis Date Noted  . Acute ischemic left middle cerebral artery (MCA) stroke ([place]) 10/03/2019  . Acute ischemic stroke ([place]) 10/02/2019   PCP:  Patient, No Pcp Per Pharmacy:   [id number] [place], [place] - [address] [place] [address]Phone: [phone]: [phone]    Social Determinants of Health (SDOH) Interventions    Readmission Risk Interventions No flowsheet data found.   JReinaldo Raddle RN, BSN  Trauma/Neuro ICU Case Manager [phone]

## 2019-10-04 NOTE — Progress Notes (Signed)
150 ml fentanyl wasted in hazardous bin witness by Scarlette Slice RN.

## 2019-10-04 NOTE — Progress Notes (Signed)
Bilateral lower extremity venous duplex exam completed.  Preliminary results can be found under CV proc under chart review.  10/04/2019 1:18 PM  Stasia Somero, K., RDMS, RVT

## 2019-10-05 ENCOUNTER — Inpatient Hospital Stay (HOSPITAL_COMMUNITY): Payer: Medicaid Other

## 2019-10-05 ENCOUNTER — Encounter (HOSPITAL_COMMUNITY): Payer: Self-pay | Admitting: Student in an Organized Health Care Education/Training Program

## 2019-10-05 DIAGNOSIS — E876 Hypokalemia: Secondary | ICD-10-CM

## 2019-10-05 DIAGNOSIS — D72829 Elevated white blood cell count, unspecified: Secondary | ICD-10-CM

## 2019-10-05 DIAGNOSIS — I9589 Other hypotension: Secondary | ICD-10-CM

## 2019-10-05 DIAGNOSIS — R739 Hyperglycemia, unspecified: Secondary | ICD-10-CM

## 2019-10-05 DIAGNOSIS — I639 Cerebral infarction, unspecified: Secondary | ICD-10-CM

## 2019-10-05 LAB — CBC
HCT: 33.6 % — ABNORMAL LOW (ref 36.0–46.0)
HCT: 28.3 % — ABNORMAL LOW (ref 36.0–46.0)
Hemoglobin: 10.5 g/dL — ABNORMAL LOW (ref 12.0–15.0)
Hemoglobin: 9.2 g/dL — ABNORMAL LOW (ref 12.0–15.0)
MCH: 30.6 pg (ref 26.0–34.0)
MCH: 30.8 pg (ref 26.0–34.0)
MCHC: 31.3 g/dL (ref 30.0–36.0)
MCHC: 32.5 g/dL (ref 30.0–36.0)
MCV: 98 fL (ref 80.0–100.0)
MCV: 94.6 fL (ref 80.0–100.0)
Platelets: 290 10*3/uL (ref 150–400)
Platelets: 243 10*3/uL (ref 150–400)
RBC: 3.43 MIL/uL — ABNORMAL LOW (ref 3.87–5.11)
RBC: 2.99 MIL/uL — ABNORMAL LOW (ref 3.87–5.11)
RDW: 13.3 % (ref 11.5–15.5)
RDW: 12.7 % (ref 11.5–15.5)
WBC: 15 10*3/uL — ABNORMAL HIGH (ref 4.0–10.5)
WBC: 11.1 10*3/uL — ABNORMAL HIGH (ref 4.0–10.5)
nRBC: 0 % (ref 0.0–0.2)
nRBC: 0 % (ref 0.0–0.2)

## 2019-10-05 LAB — BASIC METABOLIC PANEL
Anion gap: 9 (ref 5–15)
BUN: 8 mg/dL (ref 6–20)
CO2: 21 mmol/L — ABNORMAL LOW (ref 22–32)
Calcium: 8.3 mg/dL — ABNORMAL LOW (ref 8.9–10.3)
Chloride: 111 mmol/L (ref 98–111)
Creatinine, Ser: 0.71 mg/dL (ref 0.44–1.00)
GFR calc Af Amer: >60 mL/min (ref >60)
GFR calc non Af Amer: >60 mL/min (ref >60)
Glucose, Bld: 88 mg/dL (ref 70–99)
Potassium: 3.3 mmol/L — ABNORMAL LOW (ref 3.5–5.1)
Sodium: 141 mmol/L (ref 135–145)

## 2019-10-05 LAB — GLUCOSE, CAPILLARY
Glucose-Capillary: 104 mg/dL — ABNORMAL HIGH (ref 70–99)
Glucose-Capillary: 87 mg/dL (ref 70–99)

## 2019-10-05 LAB — PLATELET INHIBITION P2Y12: Platelet Function  P2Y12: 39 [PRU] — ABNORMAL LOW (ref 182–335)

## 2019-10-05 IMAGING — DX DG CHEST 1V PORT
1 series · 1 of 1 positions shown · non-contrast
Comparison: [DATE]

CLINICAL DATA: Acute respiratory failure with hypoxia.

EXAM:
PORTABLE CHEST 1 VIEW

[chest ap]
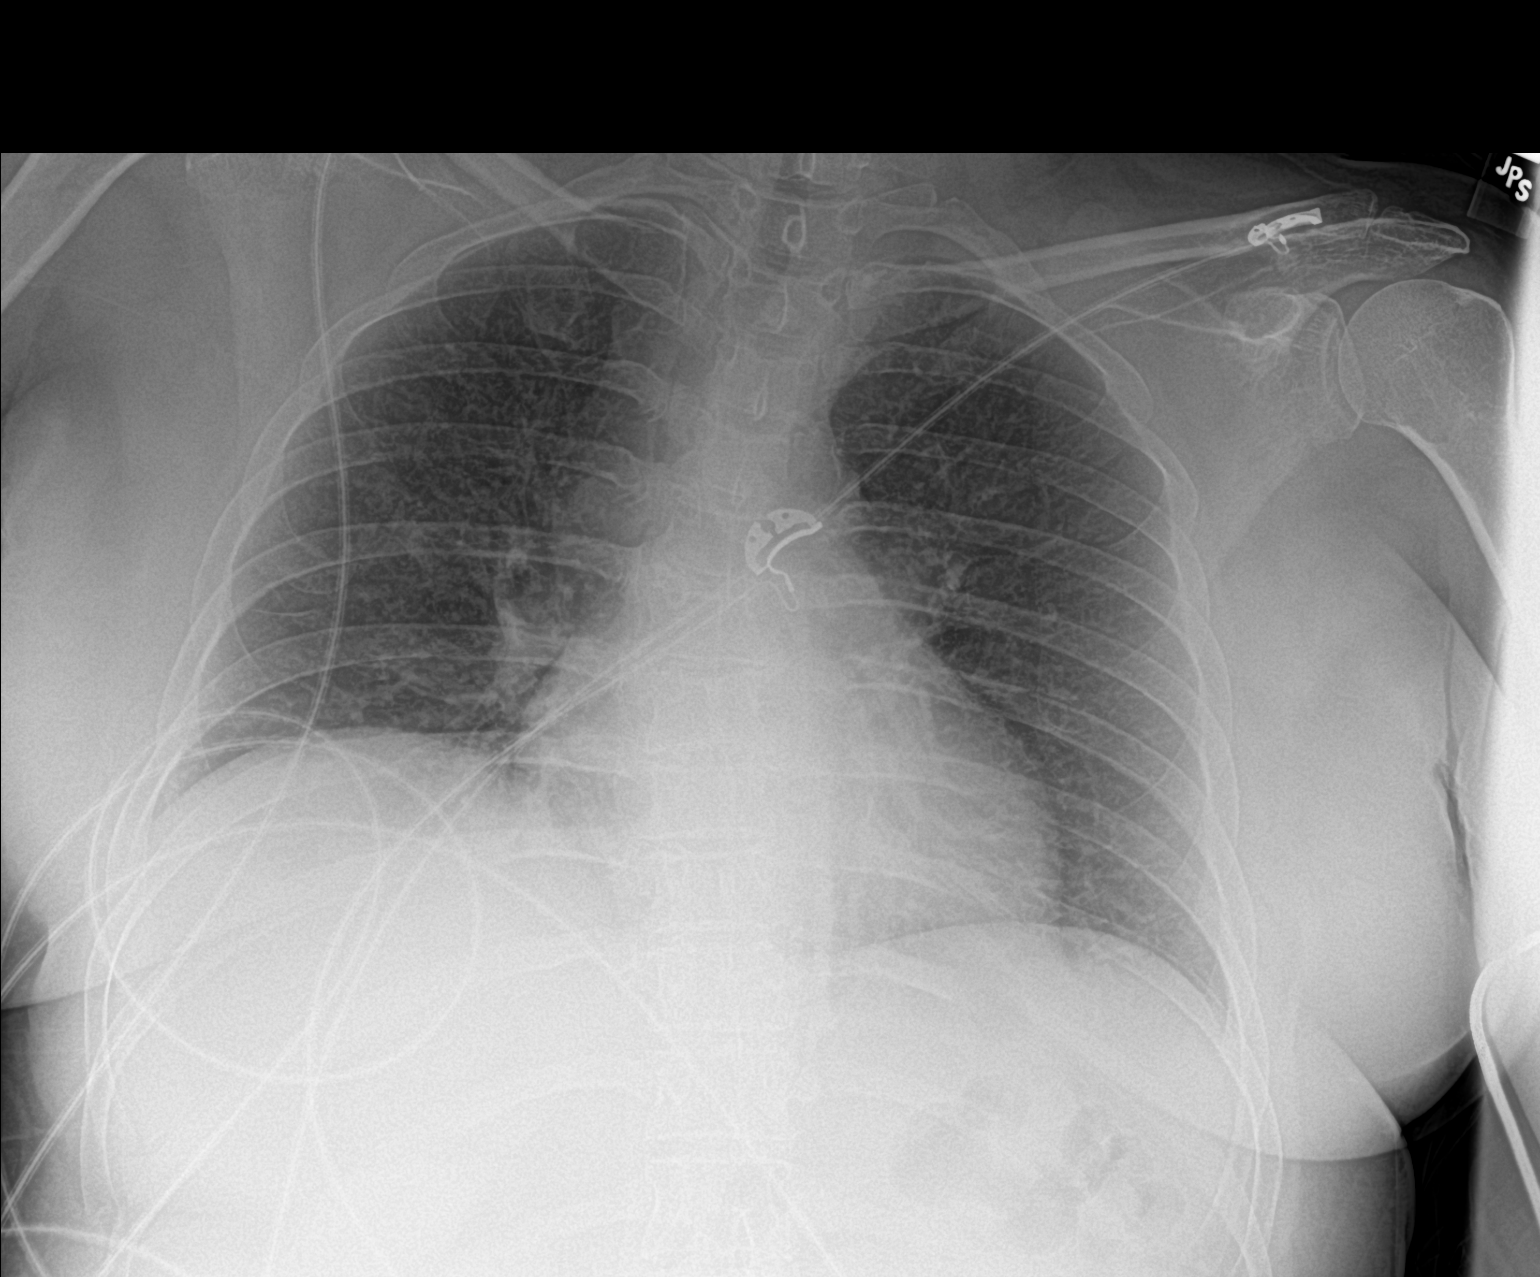

[1 of 1 positions shown; findings below may reference images not displayed]

FINDINGS: The endotracheal tube and NG tubes have been removed.

The cardiac silhouette, mediastinal and hilar contours are normal.

The lungs are clear. No pleural effusions.

The bony thorax is intact.
IMPRESSION: No acute cardiopulmonary findings.

## 2019-10-05 MED ORDER — ATORVASTATIN CALCIUM 80 MG PO TABS
80.0000 mg | ORAL_TABLET | Freq: Every day | ORAL | Status: DC
  Administered 2019-10-05 – 2019-10-07 (×3): 80 mg via ORAL
  Filled 2019-10-05 (×3): qty 1

## 2019-10-05 MED ORDER — SODIUM CHLORIDE 0.9 % IV SOLN: INTRAVENOUS | Status: DC | PRN

## 2019-10-05 MED ORDER — PANTOPRAZOLE SODIUM 40 MG PO PACK
40.0000 mg | PACK | ORAL | Status: DC
  Administered 2019-10-06 – 2019-10-07 (×2): 40 mg via ORAL
  Filled 2019-10-05 (×3): qty 20

## 2019-10-05 MED ORDER — POTASSIUM CHLORIDE 10 MEQ/100ML IV SOLN
10.0000 meq | INTRAVENOUS | Status: AC
  Administered 2019-10-05 (×3): 10 meq via INTRAVENOUS
  Filled 2019-10-05 (×3): qty 100

## 2019-10-05 MED ORDER — ENOXAPARIN SODIUM 40 MG/0.4ML ~~LOC~~ SOLN
40.0000 mg | SUBCUTANEOUS | Status: DC
  Administered 2019-10-05 – 2019-10-07 (×3): 40 mg via SUBCUTANEOUS
  Filled 2019-10-05 (×3): qty 0.4

## 2019-10-05 MED ORDER — DEXTROSE-NACL 5-0.9 % IV SOLN: INTRAVENOUS | Status: DC

## 2019-10-05 MED ORDER — SODIUM CHLORIDE 0.9 % IV SOLN: INTRAVENOUS | Status: DC

## 2019-10-05 MED ORDER — INSULIN ASPART 100 UNIT/ML ~~LOC~~ SOLN: 0.0000 [IU] | Freq: Three times a day (TID) | SUBCUTANEOUS | Status: DC

## 2019-10-05 NOTE — Evaluation (Signed)
Speech Language Pathology Evaluation Patient Details Name: Erin Peck MRN: [id number] DOB: 13-Nov-1965 Today's Date: 10/05/2019 Time: 8756-4332 SLP Time Calculation (min) (ACUTE ONLY): 24 min  Problem List:  Patient Active Problem List   Diagnosis Date Noted  . Acute ischemic left middle cerebral artery (MCA) stroke (HCC) 10/03/2019  . Acute ischemic stroke (HCC) 10/02/2019   Past Medical History: History reviewed. No pertinent past medical history. Past Surgical History:  Past Surgical History:  Procedure Laterality Date  . IR CT HEAD LTD  10/03/2019  . IR INTRA CRAN STENT  10/03/2019  . IR PATIENT EVAL TECH 0-60 MINS  10/03/2019  . IR PERCUTANEOUS ART THROMBECTOMY/INFUSION INTRACRANIAL INC DIAG ANGIO  10/03/2019  . 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 Intubated 3/8-3/10.   Assessment / Plan / Recommendation Clinical Impression  Pt presents with cognitive-linguistic impairments including disorientation to location and situation, reduced storage and retrieval of new information, and limited awareness into her acute deficits. She has limited verbal output but her comprehension seems intact with basic questioning and command following. She has a more difficulty time responding to open-ended questions or having conversation. Most of her expressive language is limited to the word or short phrase level and although she can complete a confrontational naming task well, she has word-finding errors noted in divergent naming tasks and in spontaneous production. She has moderately reduced intelligibility of speech that greatly improves with Min cues to increase her vocal intensity. Pt would benefit from SLP f/u to maximize communication and independence.     SLP  Assessment  SLP Recommendation/Assessment: Patient needs continued Speech Lanaguage Pathology Services SLP Visit Diagnosis: Aphasia (R47.01);Cognitive communication deficit (R41.841)    Follow Up Recommendations  Inpatient Rehab    Frequency and Duration min 2x/week  2 weeks      SLP Evaluation Cognition  Overall Cognitive Status: Impaired/Different from baseline Arousal/Alertness: Awake/alert Orientation Level: Oriented to person;Oriented to time;Disoriented to place;Disoriented to situation Memory: Impaired Memory Impairment: Storage deficit;Retrieval deficit;Decreased recall of new information Awareness: Impaired Awareness Impairment: Intellectual impairment;Emergent impairment Problem Solving: Impaired Problem Solving Impairment: Verbal basic       Comprehension  Auditory Comprehension Overall Auditory Comprehension: Impaired Yes/No Questions: Within Functional Limits Commands: Within Functional Limits Conversation: Simple Interfering Components: Processing speed    Expression Expression Primary Mode of Expression: Verbal Verbal Expression Overall Verbal Expression: Impaired Initiation: No impairment Automatic Speech: Name;Social Response Level of Generative/Spontaneous Verbalization: Phrase Repetition: No impairment(at word level) Naming: Impairment Confrontation: Within functional limits Divergent: 0-24% accurate Verbal Errors: Phonemic paraphasias Non-Verbal Means of Communication: Not applicable   Oral / Motor  Motor Speech Overall Motor Speech: Impaired Respiration: Within functional limits Phonation: Low vocal intensity Resonance: Within functional limits Articulation: Impaired(mild) Level of Impairment: Phrase Intelligibility: Intelligibility reduced Phrase: 75-100% accurate Interfering Components: (recent intubation) Effective Techniques: Increased vocal intensity   GO                     Erin Peck., M.A. CCC-SLP Acute Rehabilitation  Services Pager [phone] Office [phone]  10/05/2019, 11:00 AM

## 2019-10-05 NOTE — Progress Notes (Signed)
NAME:  Erin Peck, MRN:  [id number], DOB:  1965-10-07, LOS: 3 ADMISSION DATE:  10/02/2019, CONSULTATION DATE: 10/03/2019 REFERRING MD:  Dr. 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   Past Medical History  Migraine HA  Significant Hospital Events   3/8 Admitted/ tPA/ mechanical thrombectomy   Consults:  Neuro IR   Procedures:  3/8 ETT >> 3/10 3/8 left radial aline >> 3/8 right femoral sheath >> 3/9  Significant Diagnostic Tests:  3/8 CTH >> 1. No acute cortically based infarct or acute intracranial hemorrhage identified. ASPECTS 10. 2. 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  3/8 CTA head/ neck  >> 1. Positive CTA for emergent large vessel occlusion, with occlusive thrombus involving the distal left M1 segment/left MCA bifurcation. Fairly robust perfusion within the distal left MCA branches distally, could be related to subocclusive thrombus and/or collateralization. 2. 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 3. 50% stenosis at the origin of the dominant right vertebral artery. 4. Hypoplastic left vertebral artery, occluded within the neck, with distal reconstitution by the skull base. 5.  Emphysema   3/8 CT perfusion study >> 19 cc acute core infarct involving the mid and posterior left frontal region, left MCA distribution. 94 cc surrounding ischemic penumbra, mismatch volume 75 cc.  MRI/a brain 3/9 >> stented left MCA appears to be partially responsible for decreased left M1 flow signal, question residual hemodynamically significant MCA stenosis patchy left MCA infarcts, fluid posterior caudate and lentiform, no mass-effect  CT angiogram 3/10 >> Y stenting of the left M1 and M2 segments with nonocclusive in stent thrombus narrowing the upper  limb. Unchanged left MCA  Micro Data:  3/8 SARS 2 / Flu A/ B >> neg  Antimicrobials:  3/8 cefazolin pre op  Interim history/subjective:  Extubated yesterday. Overnight low-normal hypotension s/p IVF resuscitation.  Objective   Blood pressure 125/60, pulse 83, temperature 98.3 F (36.8 C), temperature source Oral, resp. rate (!) 24, height 5\' 5"  (1.651 m), weight 74.1 kg, SpO2 92 %.    Vent Mode: PSV;CPAP FiO2 (%):  [40 %] 40 % Set Rate:  [20 bmp] 20 bmp Vt Set:  [340 mL] 340 mL PEEP:  [5 cmH20] 5 cmH20 Pressure Support:  [5 cmH20] 5 cmH20 Plateau Pressure:  [12 cmH20] 12 cmH20   Intake/Output Summary (Last 24 hours) at 10/05/2019 0720 Last data filed at 10/05/2019 0600 Gross per 24 hour  Intake 2806.59 ml  Output 2450 ml  Net 356.59 ml   Filed Weights   10/02/19 1952 10/03/19 0130  Weight: 60 kg 74.1 kg   Physical Exam: General: Well-appearing, no acute distress, on room air HENT: [place], AT, OP clear, MMM Eyes: EOMI, no scleral icterus Respiratory: Clear to auscultation bilaterally.  No crackles, wheezing or rales Cardiovascular: RRR, -M/R/G, no JVD GI: BS+, soft, nontender Extremities:-Edema,-tenderness Neuro: Awake, oriented to self and location, follows commands, moves extremities x 4, strength 4/5 in upper and lower limbs Skin: Intact, no rashes or bruising Psych: Normal mood, normal affect GU: Foley in place  Resolved Hospital Problem list    Assessment & Plan:   Acute hypoxic respiratory failure with compromised airway in setting of CVA. Extubated yesterday to RA  Left MCA M1 CVA s/p tPA and mechanical thrombectomy requiring rescue stenting. Aspirin, Brilinta as ordered  Echocardiogram unremarkable and lower extremity Dopplers neg for DVT Management per Neuro  Hypertension. SBP goal 120-140  Pulmonary team will sign off. Please call for any questions or concerns.  Best practice:  Diet: Per Speech DVT prophylaxis: SCDs GI prophylaxis: PPI Mobility:  BR Code Status: full  Disposition: Transfer to floor  Labs    CMP Latest Ref Rng & Units 10/04/2019 10/03/2019 10/03/2019  Glucose 70 - 99 mg/dL 174(Y) - 814(G)  BUN 6 - 20 mg/dL 8 - 9  Creatinine 8.18 - 1.00 mg/dL 5.63 - 1.49  Sodium 702 - 145 mmol/L 143 139 139  Potassium 3.5 - 5.1 mmol/L 3.6 3.8 4.1  Chloride 98 - 111 mmol/L 112(H) - 108  CO2 22 - 32 mmol/L 23 - 22  Calcium 8.9 - 10.3 mg/dL 8.2(L) - 8.9  Total Protein 6.5 - 8.1 g/dL - - -  Total Bilirubin 0.3 - 1.2 mg/dL - - -  Alkaline Phos 38 - 126 U/L - - -  AST 15 - 41 U/L - - -  ALT 0 - 44 U/L - - -    CBC Latest Ref Rng & Units 10/04/2019 10/03/2019 10/03/2019  WBC 4.0 - 10.5 K/uL 15.0(H) - 11.4(H)  Hemoglobin 12.0 - 15.0 g/dL 10.5(L) 13.6 13.8  Hematocrit 36.0 - 46.0 % 33.6(L) 40.0 40.9  Platelets 150 - 400 K/uL 290 - 356    ABG    Component Value Date/Time   PHART 7.336 (L) 10/03/2019 0231   PCO2ART 43.4 10/03/2019 0231   PO2ART 106.0 10/03/2019 0231   HCO3 23.4 10/03/2019 0231   TCO2 25 10/03/2019 0231   ACIDBASEDEF 3.0 (H) 10/03/2019 0231   O2SAT 98.0 10/03/2019 0231    CBG (last 3)  Recent Labs    10/02/19 1955  GLUCAP 124*    Mechele Collin, M.D. [facility] Pulmonary/Critical Care Medicine 10/05/2019 7:29 AM

## 2019-10-05 NOTE — Progress Notes (Signed)
Modified Barium Swallow Progress Note  Patient Details  Name: Erin Peck MRN: [id number] Date of Birth: 21-Sep-1965  Today's Date: 10/05/2019  Modified Barium Swallow completed.  Full report located under Chart Review in the Imaging Section.  Brief recommendations include the following:  Clinical Impression  Pt demonstrates a primary oral dysphagia with mild right anterior labial weakness and decreased lingual strength. Pt has mild anterior bolus loss with thin via cup, better containment with a straw. Pt also has mild residue on the lingual body that spills to pharynx post swallow, resulting in some instances of trace spillage to pyriform sinuses or even very trace penetration, all sensed and cleared with a second swallow. Pt able to masticate soft solids and again lears residue with a second swallow. Recommend dys 3 mech soft given UE weakness and potential difficulty cutting and preparing foods. Thin liquids, straw is best with aspiration precautions. SLP will f/u for tolerance.    Swallow Evaluation Recommendations       SLP Diet Recommendations: Dysphagia 3 (Mech soft) solids;Thin liquid   Liquid Administration via: Straw   Medication Administration: Whole meds with puree   Supervision: Staff to assist with self feeding   Compensations: Slow rate;Small sips/bites   Postural Changes: Remain semi-upright after after feeds/meals (Comment);Seated upright at 90 degrees   Oral Care Recommendations: Oral care BID       Harlon Ditty, MA CCC-SLP  Acute Rehabilitation Services Pager [phone] Office [phone]  Claudine Mouton 10/05/2019,1:38 PM

## 2019-10-05 NOTE — Progress Notes (Signed)
   [facility] has been requested to perform a transesophageal echocardiogram on Erin Peck for CVA.  After careful review of history and examination, the risks and benefits of transesophageal echocardiogram have been explained including risks of esophageal damage, perforation (1:10,000 risk), bleeding, pharyngeal hematoma as well as other potential complications associated with conscious sedation including aspiration, arrhythmia, respiratory failure and death. Alternatives to treatment were discussed, questions were answered. Patient is willing to proceed.   Procedure is scheduled for 10/06/2019 at 3pm with Dr. Mayford Knife. Will place orders.  Reviewed chart: Patient admitted with acute CVA s/p tPA and mechanical thrombectomy and placement of stent. Patient required intubation for this procedure and was extubated on 10/04/2019. Patient reports mild sore throat following extubation and daughter states they were told her throat may be a little swollen. Hemoglobin 9.2 today, down from 10.5 yesterday and 13.6 the days before. Patient denies any abnormal bleeding. Patient already has daily CBCs ordered so can make sure hemoglobin is stable tomorrow.   Corrin Parker, PA-C 10/05/2019 1:08 PM

## 2019-10-05 NOTE — Progress Notes (Signed)
Referring Physician(s): Code Stroke- Milon Dikes  Supervising Physician: Julieanne Cotton  Patient Status:  [facility] - In-pt  Chief Complaint:  L MCA CVA  Subjective:  Patient extubated yesterday.  Sitting up in bed today. Conversant.  Able to answer orientation questions with exception of place-  Knows she is in [place], but does not know name of hospital.  Somewhat flat affect.  Moves right side with noticeable weakness compared to left.  Mild R facial droop, to be reassessed by SLP today.  Failed BSE yesterday.    Allergies: Patient has no known allergies.  Medications: Prior to Admission medications   Medication Sig Start Date End Date Taking? Authorizing Provider  ibuprofen (ADVIL) 200 MG tablet Take 200 mg by mouth every 6 (six) hours as needed for mild pain or moderate pain.   Yes [provider]     Vital Signs: BP (!) 125/57   Pulse 66   Temp 98.2 F (36.8 C) (Oral)   Resp 20   Ht 5\' 5"  (1.651 m)   Wt 163 lb 5.8 oz (74.1 kg)   SpO2 94%   BMI 27.18 kg/m   Physical Exam  NAD, alert, breathing comfortably on room air Neuro:  Alert, oriented x3, R facial droop, EOMs intact, reduced blink to threat on the right, R arm strength 3/5, hand grip strength 4/5 on right.  R LE strength 4/5. Groin:  Bruising medial to puncture site which may be related to oozing from sheath yesterday.  Mildly tender. Small area of firmness medially which may represent developing scar. Non-pulsatile.   Imaging: CT ANGIO HEAD W OR WO CONTRAST  Result Date: 10/04/2019 CLINICAL DATA:  Stroke follow-up EXAM: CT ANGIOGRAPHY HEAD TECHNIQUE: Multidetector CT imaging of the head was performed using the standard protocol during bolus administration of intravenous contrast. Multiplanar CT image reconstructions and MIPs were obtained to evaluate the vascular anatomy. CONTRAST:  75mL OMNIPAQUE IOHEXOL 350 MG/ML SOLN COMPARISON:  Brain MRI from yesterday FINDINGS: CT HEAD Brain:  Cytotoxic edema from infarct in the left basal ganglia and adjacent white matter, unchanged in extent from prior brain MRI. There is also pre-existing lacunar infarct in the left frontal white matter. No hemorrhagic conversion or midline shift. Vascular: See below Skull: Negative Sinuses: Patchy chronic appearing sinusitis Orbits: Negative CTA HEAD Anterior circulation: The siphons are symmetrically patent. Y stent at the left M1 to M2 segments with nonocclusive thrombus seen in the lumen of the upper limb. The lower limb is well enhancing. Limited visualization at the margins of the stent due to artifact from radiopaque markers. Hypoplastic right A1 segment. Negative for aneurysm or right-sided embolic disease Posterior circulation: Right dominant vertebral artery. Basilar is smooth and widely patent. No branch occlusion or beading. Negative for aneurysm. Venous sinuses: Diffusely patent Anatomic variants: None significant Call has been placed to the Neurology team. IMPRESSION: 1. Y stenting of the left M1 and M2 segments with nonocclusive in stent thrombus narrowing the upper limb. 2. Acute left MCA territory infarct is unchanged from brain MRI yesterday. Electronically Signed   By: Marnee Spring M.D.   On: 10/04/2019 08:21   CT ANGIO HEAD W OR WO CONTRAST  Result Date: 10/02/2019 CLINICAL DATA:  Initial evaluation for acute headache, right-sided weakness. EXAM: CT ANGIOGRAPHY HEAD AND NECK CT PERFUSION BRAIN TECHNIQUE: Multidetector CT imaging of the head and neck was performed using the standard protocol during bolus administration of intravenous contrast. Multiplanar CT image reconstructions and MIPs were obtained to evaluate  the vascular anatomy. Carotid stenosis measurements (when applicable) are obtained utilizing NASCET criteria, using the distal internal carotid diameter as the denominator. Multiphase CT imaging of the brain was performed following IV bolus contrast injection. Subsequent parametric  perfusion maps were calculated using RAPID software. CONTRAST:  OMNIPAQUE IOHEXOL 350 MG/ML SOLN COMPARISON:  Prior head CT from earlier same day. FINDINGS: CTA NECK FINDINGS Aortic arch: Visualized aortic arch normal in caliber. Bovine arch with common origin of the right brachiocephalic and left common carotid artery noted. Mild-to-moderate atheromatous plaque about the arch and origin of the great vessels without hemodynamically significant stenosis. Visualized subclavian arteries widely patent. Right carotid system: Right common carotid artery patent from its origin to the bifurcation without stenosis. Scattered eccentric calcified plaque about the right bifurcation/proximal right ICA with stenosis of up to approximately 50% by NASCET criteria. Right ICA otherwise widely patent distally to the skull base without stenosis, dissection, or occlusion. Left carotid system: Calcified plaque at the origin of the left CCA without high-grade stenosis. Left CCA otherwise patent to the bifurcation without stenosis. Mild scattered calcified plaque about the left bifurcation/proximal left ICA without significant stenosis. Left ICA widely patent distally to the skull base without stenosis, dissection or occlusion. Vertebral arteries: Both vertebral arteries arise from the subclavian arteries. Right vertebral artery dominant with a diffusely hypoplastic left vertebral artery. Focal atheromatous plaque at the origin of the right vertebral artery with approximate 50% stenosis. Right vertebral artery otherwise widely patent within the neck. 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 Distal reconstitution via muscular branches at the left V3 segment (series 7, image 158). Left vertebral is patent as it courses into the cranial vault. Skeleton: No acute osseous abnormality. No discrete or worrisome osseous lesions. Other neck: No other acute soft tissue abnormality within the neck. No adenopathy. Subcentimeter  hypodense nodule noted within the right thyroid lobe, of doubtful significance given size and patient age. No follow-up imaging recommended regarding this lesion. Upper chest: Visualized upper chest demonstrates no acute finding. Partially visualized lungs are clear. Paraseptal and centrilobular emphysematous changes noted at the lung apices. Review of the MIP images confirms the above findings CTA HEAD FINDINGS Anterior circulation: Petrous segments widely patent bilaterally. Cavernous and supraclinoid ICAs patent without flow-limiting stenosis. A1 segments patent bilaterally. Right A1 hypoplastic, accounting for the slightly diminutive right ICA is compared to the left. Normal anterior communicating artery. Anterior cerebral arteries widely patent to their distal aspects. Right M1 widely patent. Normal right MCA bifurcation. Distal right MCA branches well perfused. Left M1 patent proximally. There is occlusive thrombus within the distal left M1 segment/left MCA bifurcation (series 7, image 94). Fairly good perfusion seen within the left MCA branches distally, which could be related to subocclusive thrombus and/or collateralization. Posterior circulation: Vertebral arteries patent to the vertebrobasilar junction without stenosis. Neither PICA of visualized. Basilar widely patent to its distal aspect without stenosis. Superior cerebral arteries patent bilaterally. Both PCAs primarily supplied via the basilar and are well perfused or distal aspects. Small hypoplastic posterior communicating arteries noted bilaterally. Venous sinuses: Patent. Anatomic variants: None significant.  No aneurysm. Review of the MIP images confirms the above findings CT Brain Perfusion Findings: ASPECTS: 10 CBF (<30%) Volume: 19mL Perfusion (Tmax>6.0s) volume: 94mL Mismatch Volume: 75mL Infarction Location:Patchy acute core infarct seen involving the cortical and subcortical aspect of the mid and posterior left frontal region. Moderate  sized surrounding ischemic penumbra. IMPRESSION: CTA HEAD AND NECK IMPRESSION: 1. Positive CTA for emergent large vessel  occlusion, with occlusive thrombus involving the distal left M1 segment/left MCA bifurcation. Fairly robust perfusion within the distal left MCA branches distally, could be related to subocclusive thrombus and/or collateralization. 2. 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 3. 50% stenosis at the origin of the dominant right vertebral artery. 4. Hypoplastic left vertebral artery, occluded within the neck, with distal reconstitution by the skull base. 5.  Emphysema (ICD10-J43.9). CT PERFUSION IMPRESSION: 19 cc acute core infarct involving the mid and posterior left frontal region, left MCA distribution. 94 cc surrounding ischemic penumbra, mismatch volume 75 cc. Critical Value/emergent results were called by telephone at the time of interpretation on 10/02/2019 at 9:05 pm to provider Dr. Deretha Emory, who verbally acknowledged these results. Electronically Signed   By: Rise Mu M.D.   On: 10/02/2019 21:31   CT ANGIO NECK W OR WO CONTRAST  Result Date: 10/02/2019 CLINICAL DATA:  Initial evaluation for acute headache, right-sided weakness. EXAM: CT ANGIOGRAPHY HEAD AND NECK CT PERFUSION BRAIN TECHNIQUE: Multidetector CT imaging of the head and neck was performed using the standard protocol during bolus administration of intravenous contrast. Multiplanar CT image reconstructions and MIPs were obtained to evaluate the vascular anatomy. Carotid stenosis measurements (when applicable) are obtained utilizing NASCET criteria, using the distal internal carotid diameter as the denominator. Multiphase CT imaging of the brain was performed following IV bolus contrast injection. Subsequent parametric perfusion maps were calculated using RAPID software. CONTRAST:  OMNIPAQUE IOHEXOL 350 MG/ML SOLN COMPARISON:  Prior head CT from earlier same day. FINDINGS: CTA NECK FINDINGS  Aortic arch: Visualized aortic arch normal in caliber. Bovine arch with common origin of the right brachiocephalic and left common carotid artery noted. Mild-to-moderate atheromatous plaque about the arch and origin of the great vessels without hemodynamically significant stenosis. Visualized subclavian arteries widely patent. Right carotid system: Right common carotid artery patent from its origin to the bifurcation without stenosis. Scattered eccentric calcified plaque about the right bifurcation/proximal right ICA with stenosis of up to approximately 50% by NASCET criteria. Right ICA otherwise widely patent distally to the skull base without stenosis, dissection, or occlusion. Left carotid system: Calcified plaque at the origin of the left CCA without high-grade stenosis. Left CCA otherwise patent to the bifurcation without stenosis. Mild scattered calcified plaque about the left bifurcation/proximal left ICA without significant stenosis. Left ICA widely patent distally to the skull base without stenosis, dissection or occlusion. Vertebral arteries: Both vertebral arteries arise from the subclavian arteries. Right vertebral artery dominant with a diffusely hypoplastic left vertebral artery. Focal atheromatous plaque at the origin of the right vertebral artery with approximate 50% stenosis. Right vertebral artery otherwise widely patent within the neck. 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 Distal reconstitution via muscular branches at the left V3 segment (series 7, image 158). Left vertebral is patent as it courses into the cranial vault. Skeleton: No acute osseous abnormality. No discrete or worrisome osseous lesions. Other neck: No other acute soft tissue abnormality within the neck. No adenopathy. Subcentimeter hypodense nodule noted within the right thyroid lobe, of doubtful significance given size and patient age. No follow-up imaging recommended regarding this lesion. Upper chest:  Visualized upper chest demonstrates no acute finding. Partially visualized lungs are clear. Paraseptal and centrilobular emphysematous changes noted at the lung apices. Review of the MIP images confirms the above findings CTA HEAD FINDINGS Anterior circulation: Petrous segments widely patent bilaterally. Cavernous and supraclinoid ICAs patent without flow-limiting stenosis. A1 segments patent bilaterally. Right A1 hypoplastic, accounting  for the slightly diminutive right ICA is compared to the left. Normal anterior communicating artery. Anterior cerebral arteries widely patent to their distal aspects. Right M1 widely patent. Normal right MCA bifurcation. Distal right MCA branches well perfused. Left M1 patent proximally. There is occlusive thrombus within the distal left M1 segment/left MCA bifurcation (series 7, image 94). Fairly good perfusion seen within the left MCA branches distally, which could be related to subocclusive thrombus and/or collateralization. Posterior circulation: Vertebral arteries patent to the vertebrobasilar junction without stenosis. Neither PICA of visualized. Basilar widely patent to its distal aspect without stenosis. Superior cerebral arteries patent bilaterally. Both PCAs primarily supplied via the basilar and are well perfused or distal aspects. Small hypoplastic posterior communicating arteries noted bilaterally. Venous sinuses: Patent. Anatomic variants: None significant.  No aneurysm. Review of the MIP images confirms the above findings CT Brain Perfusion Findings: ASPECTS: 10 CBF (<30%) Volume: 19mL Perfusion (Tmax>6.0s) volume: 94mL Mismatch Volume: 75mL Infarction Location:Patchy acute core infarct seen involving the cortical and subcortical aspect of the mid and posterior left frontal region. Moderate sized surrounding ischemic penumbra. IMPRESSION: CTA HEAD AND NECK IMPRESSION: 1. Positive CTA for emergent large vessel occlusion, with occlusive thrombus involving the distal  left M1 segment/left MCA bifurcation. Fairly robust perfusion within the distal left MCA branches distally, could be related to subocclusive thrombus and/or collateralization. 2. 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 3. 50% stenosis at the origin of the dominant right vertebral artery. 4. Hypoplastic left vertebral artery, occluded within the neck, with distal reconstitution by the skull base. 5.  Emphysema (ICD10-J43.9). CT PERFUSION IMPRESSION: 19 cc acute core infarct involving the mid and posterior left frontal region, left MCA distribution. 94 cc surrounding ischemic penumbra, mismatch volume 75 cc. Critical Value/emergent results were called by telephone at the time of interpretation on 10/02/2019 at 9:05 pm to provider Dr. Deretha Emory, who verbally acknowledged these results. Electronically Signed   By: Rise Mu M.D.   On: 10/02/2019 21:31   MR ANGIO HEAD WO CONTRAST  Addendum Date: 10/03/2019   ADDENDUM REPORT: 10/03/2019 23:31 ADDENDUM: Left MCA findings discussed by telephone with Dr. Milon Dikes on 10/03/2019 at 2320 hours. And we discussed that a repeat CTA may be valuable to further quantify the Left MCA reperfusion. Electronically Signed   By: Odessa Fleming M.D.   On: 10/03/2019 23:31   Result Date: 10/03/2019 CLINICAL DATA:  54 year old female status post emergent large vessel occlusion, left M1 thrombus treated with endovascular reperfusion with rescue stenting. EXAM: MRI HEAD WITHOUT CONTRAST MRA HEAD WITHOUT CONTRAST TECHNIQUE: Multiplanar, multiecho pulse sequences of the brain and surrounding structures were obtained without intravenous contrast. Angiographic images of the head were obtained using MRA technique without contrast. COMPARISON:  CTA head and neck, CT Perfusion yesterday. FINDINGS: MRI HEAD FINDINGS Brain: Left MCA territory restricted diffusion, most confluent at the posterior left caudate and left lentiform, with additional scattered small cortical and  subcortical areas around the left insula and left inferior frontal gyrus. This is similar to although not exactly corresponding to the predicted core infarct on CTP (19 mL on that exam). Associated T2 and FLAIR hyperintensity with no convincing hemorrhagic transformation. No mass effect. No contralateral right hemisphere or posterior fossa restricted diffusion. Scattered bilateral small and mostly subcortical white matter T2 and FLAIR hyperintense foci. 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 No midline shift, mass effect, evidence of mass lesion, ventriculomegaly, extra-axial collection or  acute intracranial hemorrhage. Cervicomedullary junction and pituitary are within normal limits. Vascular: Major intracranial vascular flow voids are preserved. Skull and upper cervical spine: Negative visible cervical spine. Visualized bone marrow signal is within normal limits. Sinuses/Orbits: Negative orbits. Posterior ethmoid and sphenoid sinus mucosal thickening. Other: Layering fluid in the pharynx. Mastoids remain clear. The patient is intubated with an oral enteric tube also partially visible. MRA HEAD FINDINGS Antegrade flow in the posterior circulation with dominant right vertebral artery. Patent left vertebrobasilar junction. Normal right PICA origin. Patent basilar artery. Patent AICA, SCA and PCA origins. Both posterior communicating arteries are present. Bilateral PCA branches are within normal limits. Antegrade flow in both ICA siphons. No siphon stenosis identified. Ophthalmic and posterior communicating artery origins appear normal. Patent carotid termini. Patent MCA and ACA origins. The left ACA appears dominant. And there is a 2 mm infundibulum versus aneurysm directed superiorly from the anterior communicating artery region on series 2, image 93. But otherwise the visible ACA branches are within normal limits. Right MCA M1  segment, right MCA bifurcation and visible right MCA branches are within normal limits. The left M1 was stented. The left MCA origin is patent, with loss of most of the left MCA flow signal, although there is flow signal detected at the left MCA bifurcation. However, there is a paucity of left MCA branches when compared to the right side. Furthermore, there is asymmetric FLAIR signal within left MCA M2 and M3 branches on the anatomic images above. IMPRESSION: 1. The Left MCA was stented which is at least partially responsible for the decreased left M1 flow signal. However, decreased visualization of left MCA branches on MRA and increased branch FLAIR signal raises the possibility of residual hemodynamically significant MCA stenosis. 2. MRA is negative for other intracranial stenosis. 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 3. MRI reveals patchy left MCA infarcts most confluent at the posterior caudate and lentiform. Overall infarct volume probably similar to that predicted on CTP yesterday. 4. No intracranial mass effect or hemorrhagic transformation. 5. Chronic left corona radiata infarct. Electronically Signed: By: Odessa Fleming M.D. On: 10/03/2019 23:18   MR BRAIN WO CONTRAST  Addendum Date: 10/03/2019   ADDENDUM REPORT: 10/03/2019 23:31 ADDENDUM: Left MCA findings discussed by telephone with Dr. Milon Dikes on 10/03/2019 at 2320 hours. And we discussed that a repeat CTA may be valuable to further quantify the Left MCA reperfusion. Electronically Signed   By: Odessa Fleming M.D.   On: 10/03/2019 23:31   Result Date: 10/03/2019 CLINICAL DATA:  54 year old female status post emergent large vessel occlusion, left M1 thrombus treated with endovascular reperfusion with rescue stenting. EXAM: MRI HEAD WITHOUT CONTRAST MRA HEAD WITHOUT CONTRAST TECHNIQUE: Multiplanar, multiecho pulse sequences of the brain and surrounding structures were obtained without intravenous contrast.  Angiographic images of the head were obtained using MRA technique without contrast. COMPARISON:  CTA head and neck, CT Perfusion yesterday. FINDINGS: MRI HEAD FINDINGS Brain: Left MCA territory restricted diffusion, most confluent at the posterior left caudate and left lentiform, with additional scattered small cortical and subcortical areas around the left insula and left inferior frontal gyrus. This is similar to although not exactly corresponding to the predicted core infarct on CTP (19 mL on that exam). Associated T2 and FLAIR hyperintensity with no convincing hemorrhagic transformation. No mass effect. No contralateral right hemisphere or posterior fossa restricted diffusion. Scattered bilateral small and mostly subcortical white matter T2 and FLAIR hyperintense foci. No  chronic cortical encephalomalacia identified, but there is evidence of a chronic left corona radiata lacunar infarct. No midline shift, mass effect, evidence of mass lesion, ventriculomegaly, extra-axial collection or acute intracranial hemorrhage. Cervicomedullary junction and pituitary are within normal limits. Vascular: Major intracranial vascular flow voids are preserved. Skull and upper cervical spine: Negative visible cervical spine. Visualized bone marrow signal is within normal limits. Sinuses/Orbits: Negative orbits. Posterior ethmoid and sphenoid sinus mucosal thickening. Other: Layering fluid in the pharynx. Mastoids remain clear. The patient is intubated with an oral enteric tube also partially visible. MRA HEAD FINDINGS Antegrade flow in the posterior circulation with dominant right vertebral artery. Patent left vertebrobasilar junction. Normal right PICA origin. Patent basilar artery. Patent AICA, SCA and PCA origins. Both posterior communicating arteries are present. Bilateral PCA branches are within normal limits. Antegrade flow in both ICA siphons. No siphon stenosis identified. Ophthalmic and posterior communicating artery  origins appear normal. Patent carotid termini. Patent MCA and ACA origins. The left ACA appears dominant. And there is a 2 mm infundibulum versus aneurysm directed superiorly from the anterior communicating artery region on series 2, image 93. But otherwise the visible ACA branches are within normal limits. Right MCA M1 segment, right MCA bifurcation and visible right MCA branches are within normal limits. The left M1 was stented. The left MCA origin is patent, with loss of most of the left MCA flow signal, although there is flow signal detected at the left MCA bifurcation. However, there is a paucity of left MCA branches when compared to the right side. Furthermore, there is asymmetric FLAIR signal within left MCA M2 and M3 branches on the anatomic images above. IMPRESSION: 1. The Left MCA was stented which is at least partially responsible for the decreased left M1 flow signal. However, decreased visualization of left MCA branches on MRA and increased branch FLAIR signal raises the possibility of residual hemodynamically significant MCA stenosis. 2. MRA is negative for other intracranial stenosis. 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 3. MRI reveals patchy left MCA infarcts most confluent at the posterior caudate and lentiform. Overall infarct volume probably similar to that predicted on CTP yesterday. 4. No intracranial mass effect or hemorrhagic transformation. 5. Chronic left corona radiata infarct. Electronically Signed: By: Odessa Fleming M.D. On: 10/03/2019 23:18   IR Intra Cran Stent  Result Date: 10/04/2019 INDICATION: New onset aphasia, right-sided paralysis, and left gaze deviation. 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 EXAM: 1. EMERGENT LARGE VESSEL OCCLUSION THROMBOLYSIS (anterior CIRCULATION) 2. Intracranial rescue stent placement COMPARISON:  CT angiogram of the head and neck of October 03, 2019. MEDICATIONS:  Ancef 2 g IV antibiotic was administered within 1 hour of the procedure. ANESTHESIA/SEDATION: General anesthesia. CONTRAST:  Isovue 300 approximately 120 mL. FLUOROSCOPY TIME:  Fluoroscopy Time: 76 minutes 36 seconds (3040 mGy). COMPLICATIONS: None immediate. TECHNIQUE: Following a full explanation of the procedure along with the potential associated complications, an informed witnessed consent was obtained patient's spouse. The risks of intracranial hemorrhage of 10%, worsening neurological deficit, ventilator dependency, death and inability to revascularize were all reviewed in detail with the patient's spouse. The patient was then put under general anesthesia by the Department of Anesthesiology at [facility]. The right groin was prepped and draped in the usual sterile fashion. Thereafter using modified Seldinger technique, transfemoral access into the right common femoral artery was obtained without difficulty. Over a 0.035 inch guidewire  an 8 French 25 cm Pinnacle sheath was inserted. Through this, and also over a 0.035 inch guidewire a connection of a Berenstein 125 cm catheter inside of an 087 95 cm balloon guide catheter was advanced to the left common carotid artery. The guidewire, and the Berenstein support catheter were then removed. Good aspiration obtained from the hub of the balloon guide catheter. A gentle control arteriogram performed through balloon guide catheter demonstrated no evidence of spasms, dissections or of intraluminal filling defects. FINDINGS: However, the left common carotid arteriogram demonstrates the left external carotid artery and its major branches to be widely patent. The left internal carotid artery at the bulb to the cranial skull base also demonstrates wide patency. The petrous, the cavernous and the supraclinoid segments are intact. The left anterior cerebral artery opacifies into the capillary and venous phases with prompt cross-filling of the right anterior  cerebral A2 segment from the left internal carotid artery injection. The left middle cerebral artery demonstrates complete occlusion in its mid M1 segment. The delayed arterial phase demonstrates partial retrograde opacification of the anterior to middle perisylvian branches. PROCEDURE: Over a 0.014 inch standard Synchro micro guidewire with a J configuration the combination of a Trevo ProVue 021 microcatheter inside of a 6 French 132 cm Catalyst guide catheter was advanced using biplane roadmap technique and constant fluoroscopic guidance to the supraclinoid left ICA. The left middle cerebral artery was then entered with the micro guidewire and into the superior division of the left middle cerebral artery followed by the microcatheter. The guidewire was removed. Good aspiration obtained from the hub of the microcatheter. A gentle control arteriogram performed through this demonstrated safe position of the tip of the microcatheter which was then connected to continuous heparinized saline infusion. The 6 [place] Catalyst guide catheter was then advanced into the distal left middle cerebral artery with near complete occlusion of the aspiration with a Penumbra aspiration device for approximately 2-1/2 minutes. A 4 mm x 40 mm Solitaire X retrieval device was then advanced to the distal end of the microcatheter and deployed with slight forward gentle traction with the right hand on the delivery micro guidewire, and retrieving the delivery microcatheter. Proximal flow arrest was then established by inflating the balloon of the balloon guide catheter in the mid left internal carotid artery. With constant aspiration applied at the hub of the 6 [place] Catalyst guide catheter, and with a 60 mL syringe at the hub of the balloon guide catheter, the combination of the retrieval device, and the microcatheter and the 6 [place] Catalyst catheter were retrieved and removed. Flow arrest was then reversed. A control arteriogram  performed through the balloon guide catheter in the left internal carotid artery continued to demonstrate occluded left middle cerebral M1 segment. No evidence of clot was seen in the aspirate or entangled in the retrieval device. A second pass was then made again using the above combination. At this time access with the micro guidewire and the microcatheter was obtained in the inferior division of the left middle cerebral artery M2 M3 region where the microcatheter was positioned. The guidewire was removed. Good aspiration obtained from the hub of the microcatheter. A gentle control arteriogram performed through the microcatheter demonstrates safe position of the tip of the microcatheter. A 6 mm x 40 mm Solitaire X retrieval device was then advanced to the distal end of the microcatheter. The O ring on the delivery microcatheter was loosened. With slight forward gentle traction with the right hand on the  delivery micro guidewire, with the left hand the delivery microcatheter was retrieved deploying the retrieval device. The 6 [place] Catalyst guide was now engaged again in the occluded left middle cerebral artery distally to near complete occlusion to aspiration with the Penumbra aspiration device over 2-1/2 minutes. With proximal flow arrest and constant aspiration being applied at the hub of the balloon guide catheter in the left internal carotid artery, the combination was retrieved and removed. Again no evidence of a clot was seen in the aspirate or in the retrieval device. A control arteriogram performed through the balloon guide catheter in the left internal carotid artery again demonstrated no change in the occluded left middle cerebral artery M1 segment. A third pass was then made using a large-bore catheter inside of which was the 021 Trevo microcatheter again advanced as the combination over a 0.14 inch standard Synchro micro guidewire to the supraclinoid left ICA. Again the micro guidewire was then gently  manipulated with a torque device and this time advanced into the middle branch of the left MCA trifurcation between the inferior and the superior divisions. This is then followed by the microcatheter into the M2 M3 region. The guidewire was removed. Good aspiration obtained from the hub of the microcatheter. A gentle control arteriogram performed through the microcatheter demonstrated safe position of the tip of the microcatheter which was then connected to continuous heparinized saline infusion. A 4 mm x 40 mm Solitaire X retrieval device was again advanced to the distal end of the microcatheter. This was again deployed in the usual manner. A gentle control arteriogram performed through the 6 French large-bore catheter in the proximal left MCA now demonstrated a TICI 2c revascularization with an overlying severe atherosclerotic stenosis at the origin of the middle branch and extending into the origin of the inferior division. Again with proximal flow arrest in the left internal carotid artery, and constant aspiration with the Penumbra aspirate device with the large-bore catheter embedded in the occluded distal left MCA over 2-1/2 minutes the combination was retrieved and removed. Again no evidence of clot was found in the retrieval device, or aspirate. A control arteriogram performed following flow reversal in the left internal carotid artery demonstrated angiographically occluded left middle cerebral artery. It was felt overlying pathology was related to an underlying atherosclerotic plaque causing significant stenosis. Following discussion with the referring neuro hospitalist, it was decided to proceed with rescue stenting. At this time, the patient was loaded with Brilinta 180 mg, and aspirin 81 mg via an orogastric tube. A combination of a Headway 17 2 tip microcatheter inside of a 014 inch standard Synchro micro guidewire inside of a large-bore support catheter was then advanced as mentioned earlier to the  supraclinoid left ICA. The micro guidewire was then gently manipulated with a torque device and advanced without difficulty into the inferior division of the left MCA trifurcation followed by the microcatheter. The guidewire was removed. Good aspiration obtained from the hub of the Headway microcatheter. A gentle control arteriogram performed through microcatheter demonstrated safe position of the tip of the microcatheter. A 4 mm x 24 mm Atlas stent delivery system was then advanced to the distal end of the microcatheter, the delivery microcatheter was then positioned such that the proximal portion of the landing zone stent would be just inside the left middle cerebral artery origin. The O ring on the delivery microcatheter was loosened. With slight forward gentle traction with the right hand on the delivery micro guidewire with the left hand  the delivery microcatheter was retrieved unsheathing the distal and the proximal portion of the stent in excellent position. The tines of the stent opened completely proximally and distally. A control arteriogram performed through the large bore catheter demonstrated excellent revascularization of the inferior division into M3 M4 branches. The middle branch of the trifurcation remained occluded. The micro guidewire was reintroduced into the microcatheter and advanced without difficulty into the proximal portion of the deployed stent. Using a torque device, access was obtained into the middle branch without difficulty followed by the microcatheter was advanced to the M2 M3 region. The guidewire was removed. Good aspiration obtained from the hub of the microcatheter which was then connected to continuous heparinized saline infusion. Again another 4 mm x 24 mm Atlas Neuroform stent was advanced to the distal end of the microcatheter. This was again deployed such that the proximal portion of the stent was in the proximal portion of the left MCA. Following deployment, a control  arteriogram performed through the large bore catheter in the left middle cerebral artery demonstrated a TICI 2c revascularization. A control arteriogram performed approximately 5 minutes later demonstrated progressive slow flow in the middle branch of the stented segment. This prompted the use of approximately 6 mg of intra-arterial Integrilin for 10 minutes. A control arteriogram performed approximately 10 minutes later demonstrated slow revascularization of this stented branch. It was, therefore, decided to stop at this juncture. The large-bore catheter and the balloon guide catheter were retrieved and removed. A CT of the brain demonstrated no evidence of a gross hemorrhage, mass effect or midline shift. There was dense contrast staining in the basal ganglia region. Following this it was decided to proceed with a slow IV infusion of aggrastat and half the minimal dose run as an infusion over approximately 8-12 hours in order to protect the stented segments of the left middle cerebral artery. Patient was left intubated on account of the procedure, and also patient had received IV tPA and multiple dual antiplatelets. Additionally, the 8 French Pinnacle sheath in the right groin was left and connected to continuous heparinized saline infusion. Prior to the placement of rescue stents at the superior and inferior divisions of the left middle cerebral artery, a CT was performed of the brain on the table. This continued to demonstrate no evidence of a gross hemorrhage, or mass effect or midline shift. Contrast stain was seen in the basal ganglia region. Distal pulses remained palpable in the dorsalis pedis, and the posterior tibial regions bilaterally. The patient was then transferred intubated to the neuro ICU to continue with post revascularization care. IMPRESSION: Status post endovascular revascularization of occluded left middle cerebral artery M1 segment, with 2 passes with the Solitaire 4 mm x 40 mm X retrieval  device, 1 pass with the 6 mm x 40 mm Solitaire X retrieval device, with Penumbra aspiration, and placement of a rescue stents in the superior and inferior divisions of the left middle cerebral artery achieving a TICI 2b to TICI 2c revascularization. PLAN: Follow-up in the clinic 4 weeks post discharge. Electronically Signed   By: Julieanne Cotton M.D.   On: 10/03/2019 09:56   IR CT Head Ltd  Result Date: 10/04/2019 INDICATION: New onset aphasia, right-sided paralysis, and left gaze deviation. 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 EXAM: 1. EMERGENT LARGE VESSEL OCCLUSION THROMBOLYSIS (anterior CIRCULATION) 2. Intracranial rescue stent placement COMPARISON:  CT angiogram of the head and neck of October 03, 2019.  MEDICATIONS: Ancef 2 g IV antibiotic was administered within 1 hour of the procedure. ANESTHESIA/SEDATION: General anesthesia. CONTRAST:  Isovue 300 approximately 120 mL. FLUOROSCOPY TIME:  Fluoroscopy Time: 76 minutes 36 seconds (3040 mGy). COMPLICATIONS: None immediate. TECHNIQUE: Following a full explanation of the procedure along with the potential associated complications, an informed witnessed consent was obtained patient's spouse. The risks of intracranial hemorrhage of 10%, worsening neurological deficit, ventilator dependency, death and inability to revascularize were all reviewed in detail with the patient's spouse. The patient was then put under general anesthesia by the Department of Anesthesiology at [facility]. The right groin was prepped and draped in the usual sterile fashion. Thereafter using modified Seldinger technique, transfemoral access into the right common femoral artery was obtained without difficulty. Over a 0.035 inch guidewire an 8 French 25 cm Pinnacle sheath was inserted. Through this, and also over a 0.035 inch guidewire a connection of a Berenstein 125 cm catheter inside of an 087 95 cm balloon guide catheter was  advanced to the left common carotid artery. The guidewire, and the Berenstein support catheter were then removed. Good aspiration obtained from the hub of the balloon guide catheter. A gentle control arteriogram performed through balloon guide catheter demonstrated no evidence of spasms, dissections or of intraluminal filling defects. FINDINGS: However, the left common carotid arteriogram demonstrates the left external carotid artery and its major branches to be widely patent. The left internal carotid artery at the bulb to the cranial skull base also demonstrates wide patency. The petrous, the cavernous and the supraclinoid segments are intact. The left anterior cerebral artery opacifies into the capillary and venous phases with prompt cross-filling of the right anterior cerebral A2 segment from the left internal carotid artery injection. The left middle cerebral artery demonstrates complete occlusion in its mid M1 segment. The delayed arterial phase demonstrates partial retrograde opacification of the anterior to middle perisylvian branches. PROCEDURE: Over a 0.014 inch standard Synchro micro guidewire with a J configuration the combination of a Trevo ProVue 021 microcatheter inside of a 6 French 132 cm Catalyst guide catheter was advanced using biplane roadmap technique and constant fluoroscopic guidance to the supraclinoid left ICA. The left middle cerebral artery was then entered with the micro guidewire and into the superior division of the left middle cerebral artery followed by the microcatheter. The guidewire was removed. Good aspiration obtained from the hub of the microcatheter. A gentle control arteriogram performed through this demonstrated safe position of the tip of the microcatheter which was then connected to continuous heparinized saline infusion. The 6 [place] Catalyst guide catheter was then advanced into the distal left middle cerebral artery with near complete occlusion of the aspiration with a  Penumbra aspiration device for approximately 2-1/2 minutes. A 4 mm x 40 mm Solitaire X retrieval device was then advanced to the distal end of the microcatheter and deployed with slight forward gentle traction with the right hand on the delivery micro guidewire, and retrieving the delivery microcatheter. Proximal flow arrest was then established by inflating the balloon of the balloon guide catheter in the mid left internal carotid artery. With constant aspiration applied at the hub of the 6 [place] Catalyst guide catheter, and with a 60 mL syringe at the hub of the balloon guide catheter, the combination of the retrieval device, and the microcatheter and the 6 [place] Catalyst catheter were retrieved and removed. Flow arrest was then reversed. A control arteriogram performed through the balloon guide catheter in the left internal carotid  artery continued to demonstrate occluded left middle cerebral M1 segment. No evidence of clot was seen in the aspirate or entangled in the retrieval device. A second pass was then made again using the above combination. At this time access with the micro guidewire and the microcatheter was obtained in the inferior division of the left middle cerebral artery M2 M3 region where the microcatheter was positioned. The guidewire was removed. Good aspiration obtained from the hub of the microcatheter. A gentle control arteriogram performed through the microcatheter demonstrates safe position of the tip of the microcatheter. A 6 mm x 40 mm Solitaire X retrieval device was then advanced to the distal end of the microcatheter. The O ring on the delivery microcatheter was loosened. With slight forward gentle traction with the right hand on the delivery micro guidewire, with the left hand the delivery microcatheter was retrieved deploying the retrieval device. The 6 [place] Catalyst guide was now engaged again in the occluded left middle cerebral artery distally to near complete occlusion to  aspiration with the Penumbra aspiration device over 2-1/2 minutes. With proximal flow arrest and constant aspiration being applied at the hub of the balloon guide catheter in the left internal carotid artery, the combination was retrieved and removed. Again no evidence of a clot was seen in the aspirate or in the retrieval device. A control arteriogram performed through the balloon guide catheter in the left internal carotid artery again demonstrated no change in the occluded left middle cerebral artery M1 segment. A third pass was then made using a large-bore catheter inside of which was the 021 Trevo microcatheter again advanced as the combination over a 0.14 inch standard Synchro micro guidewire to the supraclinoid left ICA. Again the micro guidewire was then gently manipulated with a torque device and this time advanced into the middle branch of the left MCA trifurcation between the inferior and the superior divisions. This is then followed by the microcatheter into the M2 M3 region. The guidewire was removed. Good aspiration obtained from the hub of the microcatheter. A gentle control arteriogram performed through the microcatheter demonstrated safe position of the tip of the microcatheter which was then connected to continuous heparinized saline infusion. A 4 mm x 40 mm Solitaire X retrieval device was again advanced to the distal end of the microcatheter. This was again deployed in the usual manner. A gentle control arteriogram performed through the 6 French large-bore catheter in the proximal left MCA now demonstrated a TICI 2c revascularization with an overlying severe atherosclerotic stenosis at the origin of the middle branch and extending into the origin of the inferior division. Again with proximal flow arrest in the left internal carotid artery, and constant aspiration with the Penumbra aspirate device with the large-bore catheter embedded in the occluded distal left MCA over 2-1/2 minutes the  combination was retrieved and removed. Again no evidence of clot was found in the retrieval device, or aspirate. A control arteriogram performed following flow reversal in the left internal carotid artery demonstrated angiographically occluded left middle cerebral artery. It was felt overlying pathology was related to an underlying atherosclerotic plaque causing significant stenosis. Following discussion with the referring neuro hospitalist, it was decided to proceed with rescue stenting. At this time, the patient was loaded with Brilinta 180 mg, and aspirin 81 mg via an orogastric tube. A combination of a Headway 17 2 tip microcatheter inside of a 014 inch standard Synchro micro guidewire inside of a large-bore support catheter was then advanced as mentioned  earlier to the supraclinoid left ICA. The micro guidewire was then gently manipulated with a torque device and advanced without difficulty into the inferior division of the left MCA trifurcation followed by the microcatheter. The guidewire was removed. Good aspiration obtained from the hub of the Headway microcatheter. A gentle control arteriogram performed through microcatheter demonstrated safe position of the tip of the microcatheter. A 4 mm x 24 mm Atlas stent delivery system was then advanced to the distal end of the microcatheter, the delivery microcatheter was then positioned such that the proximal portion of the landing zone stent would be just inside the left middle cerebral artery origin. The O ring on the delivery microcatheter was loosened. With slight forward gentle traction with the right hand on the delivery micro guidewire with the left hand the delivery microcatheter was retrieved unsheathing the distal and the proximal portion of the stent in excellent position. The tines of the stent opened completely proximally and distally. A control arteriogram performed through the large bore catheter demonstrated excellent revascularization of the  inferior division into M3 M4 branches. The middle branch of the trifurcation remained occluded. The micro guidewire was reintroduced into the microcatheter and advanced without difficulty into the proximal portion of the deployed stent. Using a torque device, access was obtained into the middle branch without difficulty followed by the microcatheter was advanced to the M2 M3 region. The guidewire was removed. Good aspiration obtained from the hub of the microcatheter which was then connected to continuous heparinized saline infusion. Again another 4 mm x 24 mm Atlas Neuroform stent was advanced to the distal end of the microcatheter. This was again deployed such that the proximal portion of the stent was in the proximal portion of the left MCA. Following deployment, a control arteriogram performed through the large bore catheter in the left middle cerebral artery demonstrated a TICI 2c revascularization. A control arteriogram performed approximately 5 minutes later demonstrated progressive slow flow in the middle branch of the stented segment. This prompted the use of approximately 6 mg of intra-arterial Integrilin for 10 minutes. A control arteriogram performed approximately 10 minutes later demonstrated slow revascularization of this stented branch. It was, therefore, decided to stop at this juncture. The large-bore catheter and the balloon guide catheter were retrieved and removed. A CT of the brain demonstrated no evidence of a gross hemorrhage, mass effect or midline shift. There was dense contrast staining in the basal ganglia region. Following this it was decided to proceed with a slow IV infusion of aggrastat and half the minimal dose run as an infusion over approximately 8-12 hours in order to protect the stented segments of the left middle cerebral artery. Patient was left intubated on account of the procedure, and also patient had received IV tPA and multiple dual antiplatelets. Additionally, the 8 French  Pinnacle sheath in the right groin was left and connected to continuous heparinized saline infusion. Prior to the placement of rescue stents at the superior and inferior divisions of the left middle cerebral artery, a CT was performed of the brain on the table. This continued to demonstrate no evidence of a gross hemorrhage, or mass effect or midline shift. Contrast stain was seen in the basal ganglia region. Distal pulses remained palpable in the dorsalis pedis, and the posterior tibial regions bilaterally. The patient was then transferred intubated to the neuro ICU to continue with post revascularization care. IMPRESSION: Status post endovascular revascularization of occluded left middle cerebral artery M1 segment, with 2 passes with  the Solitaire 4 mm x 40 mm X retrieval device, 1 pass with the 6 mm x 40 mm Solitaire X retrieval device, with Penumbra aspiration, and placement of a rescue stents in the superior and inferior divisions of the left middle cerebral artery achieving a TICI 2b to TICI 2c revascularization. PLAN: Follow-up in the clinic 4 weeks post discharge. Electronically Signed   By: Julieanne Cotton M.D.   On: 10/03/2019 09:56   CT CEREBRAL PERFUSION W CONTRAST  Result Date: 10/02/2019 CLINICAL DATA:  Initial evaluation for acute headache, right-sided weakness. EXAM: CT ANGIOGRAPHY HEAD AND NECK CT PERFUSION BRAIN TECHNIQUE: Multidetector CT imaging of the head and neck was performed using the standard protocol during bolus administration of intravenous contrast. Multiplanar CT image reconstructions and MIPs were obtained to evaluate the vascular anatomy. Carotid stenosis measurements (when applicable) are obtained utilizing NASCET criteria, using the distal internal carotid diameter as the denominator. Multiphase CT imaging of the brain was performed following IV bolus contrast injection. Subsequent parametric perfusion maps were calculated using RAPID software. CONTRAST:  OMNIPAQUE  IOHEXOL 350 MG/ML SOLN COMPARISON:  Prior head CT from earlier same day. FINDINGS: CTA NECK FINDINGS Aortic arch: Visualized aortic arch normal in caliber. Bovine arch with common origin of the right brachiocephalic and left common carotid artery noted. Mild-to-moderate atheromatous plaque about the arch and origin of the great vessels without hemodynamically significant stenosis. Visualized subclavian arteries widely patent. Right carotid system: Right common carotid artery patent from its origin to the bifurcation without stenosis. Scattered eccentric calcified plaque about the right bifurcation/proximal right ICA with stenosis of up to approximately 50% by NASCET criteria. Right ICA otherwise widely patent distally to the skull base without stenosis, dissection, or occlusion. Left carotid system: Calcified plaque at the origin of the left CCA without high-grade stenosis. Left CCA otherwise patent to the bifurcation without stenosis. Mild scattered calcified plaque about the left bifurcation/proximal left ICA without significant stenosis. Left ICA widely patent distally to the skull base without stenosis, dissection or occlusion. Vertebral arteries: Both vertebral arteries arise from the subclavian arteries. Right vertebral artery dominant with a diffusely hypoplastic left vertebral artery. Focal atheromatous plaque at the origin of the right vertebral artery with approximate 50% stenosis. Right vertebral artery otherwise widely patent within the neck. 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 Distal reconstitution via muscular branches at the left V3 segment (series 7, image 158). Left vertebral is patent as it courses into the cranial vault. Skeleton: No acute osseous abnormality. No discrete or worrisome osseous lesions. Other neck: No other acute soft tissue abnormality within the neck. No adenopathy. Subcentimeter hypodense nodule noted within the right thyroid lobe, of doubtful significance  given size and patient age. No follow-up imaging recommended regarding this lesion. Upper chest: Visualized upper chest demonstrates no acute finding. Partially visualized lungs are clear. Paraseptal and centrilobular emphysematous changes noted at the lung apices. Review of the MIP images confirms the above findings CTA HEAD FINDINGS Anterior circulation: Petrous segments widely patent bilaterally. Cavernous and supraclinoid ICAs patent without flow-limiting stenosis. A1 segments patent bilaterally. Right A1 hypoplastic, accounting for the slightly diminutive right ICA is compared to the left. Normal anterior communicating artery. Anterior cerebral arteries widely patent to their distal aspects. Right M1 widely patent. Normal right MCA bifurcation. Distal right MCA branches well perfused. Left M1 patent proximally. There is occlusive thrombus within the distal left M1 segment/left MCA bifurcation (series 7, image 94). Fairly good perfusion seen within the left MCA  branches distally, which could be related to subocclusive thrombus and/or collateralization. Posterior circulation: Vertebral arteries patent to the vertebrobasilar junction without stenosis. Neither PICA of visualized. Basilar widely patent to its distal aspect without stenosis. Superior cerebral arteries patent bilaterally. Both PCAs primarily supplied via the basilar and are well perfused or distal aspects. Small hypoplastic posterior communicating arteries noted bilaterally. Venous sinuses: Patent. Anatomic variants: None significant.  No aneurysm. Review of the MIP images confirms the above findings CT Brain Perfusion Findings: ASPECTS: 10 CBF (<30%) Volume: 19mL Perfusion (Tmax>6.0s) volume: 94mL Mismatch Volume: 75mL Infarction Location:Patchy acute core infarct seen involving the cortical and subcortical aspect of the mid and posterior left frontal region. Moderate sized surrounding ischemic penumbra. IMPRESSION: CTA HEAD AND NECK IMPRESSION: 1.  Positive CTA for emergent large vessel occlusion, with occlusive thrombus involving the distal left M1 segment/left MCA bifurcation. Fairly robust perfusion within the distal left MCA branches distally, could be related to subocclusive thrombus and/or collateralization. 2. 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 3. 50% stenosis at the origin of the dominant right vertebral artery. 4. Hypoplastic left vertebral artery, occluded within the neck, with distal reconstitution by the skull base. 5.  Emphysema (ICD10-J43.9). CT PERFUSION IMPRESSION: 19 cc acute core infarct involving the mid and posterior left frontal region, left MCA distribution. 94 cc surrounding ischemic penumbra, mismatch volume 75 cc. Critical Value/emergent results were called by telephone at the time of interpretation on 10/02/2019 at 9:05 pm to provider Dr. Deretha Emory, who verbally acknowledged these results. Electronically Signed   By: Rise Mu M.D.   On: 10/02/2019 21:31   DG Chest Port 1 View  Result Date: 10/05/2019 CLINICAL DATA:  Acute respiratory failure with hypoxia. EXAM: PORTABLE CHEST 1 VIEW COMPARISON:  10/03/2019 FINDINGS: The endotracheal tube and NG tubes have been removed. The cardiac silhouette, mediastinal and hilar contours are normal. The lungs are clear. No pleural effusions. The bony thorax is intact. IMPRESSION: No acute cardiopulmonary findings. Electronically Signed   By: Rudie Meyer M.D.   On: 10/05/2019 08:26   DG Chest Port 1 View  Result Date: 10/03/2019 CLINICAL DATA:  54 year old female status post endovascular treatment of left MCA M1 emergent large vessel occlusion. EXAM: PORTABLE CHEST 1 VIEW COMPARISON:  CTA head and neck earlier tonight. FINDINGS: Portable AP supine view at 0141 hours. Endotracheal tube tip in good position between the level the clavicles and carina. Enteric tube courses to the stomach but the side hole is at the level of the distal esophagus. Normal cardiac  size and mediastinal contours. Allowing for portable technique the lungs are clear. No acute osseous abnormality identified. Negative visible bowel gas pattern. IMPRESSION: 1. Advance the enteric tube 5 cm to ensure side hole placement within the stomach. 2. Endotracheal tube tip in good position. 3.  No acute cardiopulmonary abnormality. Electronically Signed   By: Odessa Fleming M.D.   On: 10/03/2019 01:57   DG Abd Portable 1V  Result Date: 10/03/2019 CLINICAL DATA:  NG tube placement. EXAM: PORTABLE ABDOMEN - 1 VIEW COMPARISON:  Chest x-ray, same date. FINDINGS: The NG tube is in good position with its tip in the body region the stomach laterally. The proximal port is now well below the GE junction. Contrast noted in both renal collecting systems and ureters. There is also some contrast surrounding the right kidney suggesting a urinary leak or obstruction. Recommend clinical correlation. IMPRESSION: 1. NG tube in good position. 2. Contrast surrounding the right kidney suggesting a urinary leak  or obstruction. Recommend clinical correlation. CT abdomen/pelvis may be helpful for further evaluation. Electronically Signed   By: Rudie MeyerP.  Gallerani M.D.   On: 10/03/2019 07:07   IR PATIENT EVAL TECH 0-60 MINS  Result Date: 10/03/2019 Carlyon ProwsKoch, James M     10/03/2019  2:34 PM Right 8 french sheath removal, no hematoma present prior to sheath removal.  Held manual pressure with a Quikclot pad for 25 minutes.  No complications and distal pulses still present.  Verified site with RN and gave instructions on when to remove Quikclot pad.  Lynann BeaverJames Koch RT R, VI [place] Mullis RT R  ECHOCARDIOGRAM COMPLETE  Result Date: 10/03/2019    ECHOCARDIOGRAM REPORT   [facility]   [facility] Date of Exam: 10/03/2019 Medical Rec #:  [id number]       Height:       65.0 in Accession #:    [id number]      Weight:       163.4 lb Date of Birth:  15-Sep-1965      BSA:          1.815 m Patient Age:    53 years        BP:           116/62 mmHg Patient  Gender: F               HR:           93 bpm. Exam Location:  Inpatient Procedure: 2D Echo, Cardiac Doppler and Color Doppler Indications:   CVA  History:       Patient has no prior history of Echocardiogram examinations.                Stroke.  Sonographer:   Thurman Coyerasey Kirkpatrick RDCS (AE) Referring      [id number] ASHISH ARORA Phys: IMPRESSIONS  1. Left ventricular ejection fraction, by estimation, is 60 to 65%. The left ventricle has normal function. The left ventricle has no regional wall motion abnormalities. Left ventricular diastolic parameters were normal.  2. Right ventricular systolic function is normal. The right ventricular size is normal.  3. The mitral valve is normal in structure. No evidence of mitral valve regurgitation. No evidence of mitral stenosis.  4. The aortic valve is normal in structure. Aortic valve regurgitation is not visualized. No aortic stenosis is present.  5. The inferior vena cava is normal in size with greater than 50% respiratory variability, suggesting right atrial pressure of 3 mmHg. Comparison(s): No prior Echocardiogram. FINDINGS  Left Ventricle: Left ventricular ejection fraction, by estimation, is 60 to 65%. The left ventricle has normal function. The left ventricle has no regional wall motion abnormalities. The left ventricular internal cavity size was normal in size. There is  no left ventricular hypertrophy. Left ventricular diastolic parameters were normal. Normal left ventricular filling pressure. Right Ventricle: The right ventricular size is normal. No increase in right ventricular wall thickness. Right ventricular systolic function is normal. Left Atrium: Left atrial size was normal in size. Right Atrium: Right atrial size was normal in size. Pericardium: There is no evidence of pericardial effusion. Mitral Valve: The mitral valve is normal in structure. Normal mobility of the mitral valve leaflets. No evidence of mitral valve regurgitation. No evidence of mitral valve  stenosis. Tricuspid Valve: The tricuspid valve is normal in structure. Tricuspid valve regurgitation is not demonstrated. No evidence of tricuspid stenosis. Aortic Valve: The aortic valve is normal in structure. Aortic valve regurgitation is not visualized. No  aortic stenosis is present. Pulmonic Valve: The pulmonic valve was normal in structure. Pulmonic valve regurgitation is not visualized. No evidence of pulmonic stenosis. Aorta: The aortic root is normal in size and structure. Venous: The inferior vena cava is normal in size with greater than 50% respiratory variability, suggesting right atrial pressure of 3 mmHg. IAS/Shunts: No atrial level shunt detected by color flow Doppler.  LEFT VENTRICLE PLAX 2D LVIDd:         3.86 cm  Diastology LVIDs:         2.44 cm  LV e' lateral:   9.36 cm/s LV PW:         0.78 cm  LV E/e' lateral: 9.3 LV IVS:        0.90 cm  LV e' medial:    8.59 cm/s LVOT diam:     2.00 cm  LV E/e' medial:  10.1 LV SV:         76 LV SV Index:   42 LVOT Area:     3.14 cm  RIGHT VENTRICLE RV S prime:     15.80 cm/s TAPSE (M-mode): 2.5 cm LEFT ATRIUM             Index       RIGHT ATRIUM          Index LA diam:        3.20 cm 1.76 cm/m  RA Area:     9.71 cm LA Vol (A2C):   26.7 ml 14.71 ml/m RA Volume:   17.60 ml 9.70 ml/m LA Vol (A4C):   33.7 ml 18.57 ml/m LA Biplane Vol: 31.1 ml 17.13 ml/m  AORTIC VALVE LVOT Vmax:   110.00 cm/s LVOT Vmean:  74.700 cm/s LVOT VTI:    0.241 m  AORTA Ao Root diam: 2.60 cm MITRAL VALVE MV Area (PHT): 3.37 cm    SHUNTS MV Decel Time: 225 msec    Systemic VTI:  0.24 m MV E velocity: 86.90 cm/s  Systemic Diam: 2.00 cm MV A velocity: 88.00 cm/s MV E/A ratio:  0.99 Mihai Croitoru MD Electronically signed by Thurmon Fair MD Signature Date/Time: 10/03/2019/3:15:50 PM    Final    IR PERCUTANEOUS ART THROMBECTOMY/INFUSION INTRACRANIAL INC DIAG ANGIO  Result Date: 10/04/2019 INDICATION: New onset aphasia, right-sided paralysis, and left gaze deviation. 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 EXAM: 1. EMERGENT LARGE VESSEL OCCLUSION THROMBOLYSIS (anterior CIRCULATION) 2. Intracranial rescue stent placement COMPARISON:  CT angiogram of the head and neck of October 03, 2019. MEDICATIONS: Ancef 2 g IV antibiotic was administered within 1 hour of the procedure. ANESTHESIA/SEDATION: General anesthesia. CONTRAST:  Isovue 300 approximately 120 mL. FLUOROSCOPY TIME:  Fluoroscopy Time: 76 minutes 36 seconds (3040 mGy). COMPLICATIONS: None immediate. TECHNIQUE: Following a full explanation of the procedure along with the potential associated complications, an informed witnessed consent was obtained patient's spouse. The risks of intracranial hemorrhage of 10%, worsening neurological deficit, ventilator dependency, death and inability to revascularize were all reviewed in detail with the patient's spouse. The patient was then put under general anesthesia by the Department of Anesthesiology at [facility]. The right groin was prepped and draped in the usual sterile fashion. Thereafter using modified Seldinger technique, transfemoral access into the right common femoral artery was obtained without difficulty. Over a 0.035 inch guidewire an 8 French 25 cm Pinnacle sheath was inserted. Through this, and also over a 0.035 inch guidewire a connection of a [employer]  125 cm catheter inside of an 087 95 cm balloon guide catheter was advanced to the left common carotid artery. The guidewire, and the Berenstein support catheter were then removed. Good aspiration obtained from the hub of the balloon guide catheter. A gentle control arteriogram performed through balloon guide catheter demonstrated no evidence of spasms, dissections or of intraluminal filling defects. FINDINGS: However, the left common carotid arteriogram demonstrates the left external carotid artery and its major branches to be widely patent. The left internal carotid artery at the  bulb to the cranial skull base also demonstrates wide patency. The petrous, the cavernous and the supraclinoid segments are intact. The left anterior cerebral artery opacifies into the capillary and venous phases with prompt cross-filling of the right anterior cerebral A2 segment from the left internal carotid artery injection. The left middle cerebral artery demonstrates complete occlusion in its mid M1 segment. The delayed arterial phase demonstrates partial retrograde opacification of the anterior to middle perisylvian branches. PROCEDURE: Over a 0.014 inch standard Synchro micro guidewire with a J configuration the combination of a Trevo ProVue 021 microcatheter inside of a 6 French 132 cm Catalyst guide catheter was advanced using biplane roadmap technique and constant fluoroscopic guidance to the supraclinoid left ICA. The left middle cerebral artery was then entered with the micro guidewire and into the superior division of the left middle cerebral artery followed by the microcatheter. The guidewire was removed. Good aspiration obtained from the hub of the microcatheter. A gentle control arteriogram performed through this demonstrated safe position of the tip of the microcatheter which was then connected to continuous heparinized saline infusion. The 6 [place] Catalyst guide catheter was then advanced into the distal left middle cerebral artery with near complete occlusion of the aspiration with a Penumbra aspiration device for approximately 2-1/2 minutes. A 4 mm x 40 mm Solitaire X retrieval device was then advanced to the distal end of the microcatheter and deployed with slight forward gentle traction with the right hand on the delivery micro guidewire, and retrieving the delivery microcatheter. Proximal flow arrest was then established by inflating the balloon of the balloon guide catheter in the mid left internal carotid artery. With constant aspiration applied at the hub of the 6 [place] Catalyst guide  catheter, and with a 60 mL syringe at the hub of the balloon guide catheter, the combination of the retrieval device, and the microcatheter and the 6 [place] Catalyst catheter were retrieved and removed. Flow arrest was then reversed. A control arteriogram performed through the balloon guide catheter in the left internal carotid artery continued to demonstrate occluded left middle cerebral M1 segment. No evidence of clot was seen in the aspirate or entangled in the retrieval device. A second pass was then made again using the above combination. At this time access with the micro guidewire and the microcatheter was obtained in the inferior division of the left middle cerebral artery M2 M3 region where the microcatheter was positioned. The guidewire was removed. Good aspiration obtained from the hub of the microcatheter. A gentle control arteriogram performed through the microcatheter demonstrates safe position of the tip of the microcatheter. A 6 mm x 40 mm Solitaire X retrieval device was then advanced to the distal end of the microcatheter. The O ring on the delivery microcatheter was loosened. With slight forward gentle traction with the right hand on the delivery micro guidewire, with the left hand the delivery microcatheter was retrieved deploying the retrieval device. The 6 [place] Catalyst guide was now  engaged again in the occluded left middle cerebral artery distally to near complete occlusion to aspiration with the Penumbra aspiration device over 2-1/2 minutes. With proximal flow arrest and constant aspiration being applied at the hub of the balloon guide catheter in the left internal carotid artery, the combination was retrieved and removed. Again no evidence of a clot was seen in the aspirate or in the retrieval device. A control arteriogram performed through the balloon guide catheter in the left internal carotid artery again demonstrated no change in the occluded left middle cerebral artery M1 segment. A  third pass was then made using a large-bore catheter inside of which was the 021 Trevo microcatheter again advanced as the combination over a 0.14 inch standard Synchro micro guidewire to the supraclinoid left ICA. Again the micro guidewire was then gently manipulated with a torque device and this time advanced into the middle branch of the left MCA trifurcation between the inferior and the superior divisions. This is then followed by the microcatheter into the M2 M3 region. The guidewire was removed. Good aspiration obtained from the hub of the microcatheter. A gentle control arteriogram performed through the microcatheter demonstrated safe position of the tip of the microcatheter which was then connected to continuous heparinized saline infusion. A 4 mm x 40 mm Solitaire X retrieval device was again advanced to the distal end of the microcatheter. This was again deployed in the usual manner. A gentle control arteriogram performed through the 6 French large-bore catheter in the proximal left MCA now demonstrated a TICI 2c revascularization with an overlying severe atherosclerotic stenosis at the origin of the middle branch and extending into the origin of the inferior division. Again with proximal flow arrest in the left internal carotid artery, and constant aspiration with the Penumbra aspirate device with the large-bore catheter embedded in the occluded distal left MCA over 2-1/2 minutes the combination was retrieved and removed. Again no evidence of clot was found in the retrieval device, or aspirate. A control arteriogram performed following flow reversal in the left internal carotid artery demonstrated angiographically occluded left middle cerebral artery. It was felt overlying pathology was related to an underlying atherosclerotic plaque causing significant stenosis. Following discussion with the referring neuro hospitalist, it was decided to proceed with rescue stenting. At this time, the patient was loaded  with Brilinta 180 mg, and aspirin 81 mg via an orogastric tube. A combination of a Headway 17 2 tip microcatheter inside of a 014 inch standard Synchro micro guidewire inside of a large-bore support catheter was then advanced as mentioned earlier to the supraclinoid left ICA. The micro guidewire was then gently manipulated with a torque device and advanced without difficulty into the inferior division of the left MCA trifurcation followed by the microcatheter. The guidewire was removed. Good aspiration obtained from the hub of the Headway microcatheter. A gentle control arteriogram performed through microcatheter demonstrated safe position of the tip of the microcatheter. A 4 mm x 24 mm Atlas stent delivery system was then advanced to the distal end of the microcatheter, the delivery microcatheter was then positioned such that the proximal portion of the landing zone stent would be just inside the left middle cerebral artery origin. The O ring on the delivery microcatheter was loosened. With slight forward gentle traction with the right hand on the delivery micro guidewire with the left hand the delivery microcatheter was retrieved unsheathing the distal and the proximal portion of the stent in excellent position. The tines of the stent  opened completely proximally and distally. A control arteriogram performed through the large bore catheter demonstrated excellent revascularization of the inferior division into M3 M4 branches. The middle branch of the trifurcation remained occluded. The micro guidewire was reintroduced into the microcatheter and advanced without difficulty into the proximal portion of the deployed stent. Using a torque device, access was obtained into the middle branch without difficulty followed by the microcatheter was advanced to the M2 M3 region. The guidewire was removed. Good aspiration obtained from the hub of the microcatheter which was then connected to continuous heparinized saline  infusion. Again another 4 mm x 24 mm Atlas Neuroform stent was advanced to the distal end of the microcatheter. This was again deployed such that the proximal portion of the stent was in the proximal portion of the left MCA. Following deployment, a control arteriogram performed through the large bore catheter in the left middle cerebral artery demonstrated a TICI 2c revascularization. A control arteriogram performed approximately 5 minutes later demonstrated progressive slow flow in the middle branch of the stented segment. This prompted the use of approximately 6 mg of intra-arterial Integrilin for 10 minutes. A control arteriogram performed approximately 10 minutes later demonstrated slow revascularization of this stented branch. It was, therefore, decided to stop at this juncture. The large-bore catheter and the balloon guide catheter were retrieved and removed. A CT of the brain demonstrated no evidence of a gross hemorrhage, mass effect or midline shift. There was dense contrast staining in the basal ganglia region. Following this it was decided to proceed with a slow IV infusion of aggrastat and half the minimal dose run as an infusion over approximately 8-12 hours in order to protect the stented segments of the left middle cerebral artery. Patient was left intubated on account of the procedure, and also patient had received IV tPA and multiple dual antiplatelets. Additionally, the 8 French Pinnacle sheath in the right groin was left and connected to continuous heparinized saline infusion. Prior to the placement of rescue stents at the superior and inferior divisions of the left middle cerebral artery, a CT was performed of the brain on the table. This continued to demonstrate no evidence of a gross hemorrhage, or mass effect or midline shift. Contrast stain was seen in the basal ganglia region. Distal pulses remained palpable in the dorsalis pedis, and the posterior tibial regions bilaterally. The patient was  then transferred intubated to the neuro ICU to continue with post revascularization care. IMPRESSION: Status post endovascular revascularization of occluded left middle cerebral artery M1 segment, with 2 passes with the Solitaire 4 mm x 40 mm X retrieval device, 1 pass with the 6 mm x 40 mm Solitaire X retrieval device, with Penumbra aspiration, and placement of a rescue stents in the superior and inferior divisions of the left middle cerebral artery achieving a TICI 2b to TICI 2c revascularization. PLAN: Follow-up in the clinic 4 weeks post discharge. Electronically Signed   By: Julieanne Cotton M.D.   On: 10/03/2019 09:56   CT HEAD CODE STROKE WO CONTRAST  Result Date: 10/02/2019 CLINICAL DATA:  Code stroke. 54 year old female with severe headache and right side weakness since 1750 hours. EXAM: CT HEAD WITHOUT CONTRAST TECHNIQUE: Contiguous axial images were obtained from the base of the skull through the vertex without intravenous contrast. COMPARISON:  None. FINDINGS: Brain: No midline shift, mass effect, or evidence of intracranial mass lesion. No ventriculomegaly. No acute intracranial hemorrhage identified. No cortically based acute infarct identified. Chronic appearing white matter  hypodensity at the anterior left corona radiata on series 2, image 16. Elsewhere gray-white matter differentiation is within normal limits. Vascular: No suspicious intracranial vascular hyperdensity. Skull: Negative. Sinuses/Orbits: Clear aside from some right posterior ethmoid air cell mucosal thickening. Mastoids and tympanic cavities appear clear. Other: Visualized orbits and scalp soft tissues are within normal limits. ASPECTS [facility] Stroke Program Early CT Score) Total score (0-10 with 10 being normal): 10 IMPRESSION: 1. No acute cortically based infarct or acute intracranial hemorrhage identified. ASPECTS 10. 2. 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  Study discussed by telephone with Dr. Fredia Sorrow on 10/02/2019 at 20:18 . Electronically Signed   By: Genevie Ann M.D.   On: 10/02/2019 20:20   VAS [place] LOWER EXTREMITY VENOUS (DVT)  Result Date: 10/04/2019  Lower Venous DVTStudy Indications: Stroke.  Comparison Study: No prior exam. Performing Technologist: Baldwin Crown ARDMS, RVT  Examination Guidelines: A complete evaluation includes B-mode imaging, spectral Doppler, color Doppler, and power Doppler as needed of all accessible portions of each vessel. Bilateral testing is considered an integral part of a complete examination. Limited examinations for reoccurring indications may be performed as noted. The reflux portion of the exam is performed with the patient in reverse Trendelenburg.  +---------+---------------+---------+-----------+----------+-------------------+ RIGHT    CompressibilityPhasicitySpontaneityPropertiesThrombus Aging      +---------+---------------+---------+-----------+----------+-------------------+ CFV      Full           Yes      Yes                                      +---------+---------------+---------+-----------+----------+-------------------+ SFJ      Full                                                             +---------+---------------+---------+-----------+----------+-------------------+ FV Prox  Full                                                             +---------+---------------+---------+-----------+----------+-------------------+ FV Mid   Full                                                             +---------+---------------+---------+-----------+----------+-------------------+ FV DistalFull                                                             +---------+---------------+---------+-----------+----------+-------------------+ PFV  not visualized due                                                         to bandage          +---------+---------------+---------+-----------+----------+-------------------+ POP      Full           Yes      Yes                                      +---------+---------------+---------+-----------+----------+-------------------+ PTV      Full                                                             +---------+---------------+---------+-----------+----------+-------------------+ PERO     Full                                                             +---------+---------------+---------+-----------+----------+-------------------+   +---------+---------------+---------+-----------+----------+--------------+ LEFT     CompressibilityPhasicitySpontaneityPropertiesThrombus Aging +---------+---------------+---------+-----------+----------+--------------+ CFV      Full           Yes      Yes                                 +---------+---------------+---------+-----------+----------+--------------+ SFJ      Full                                                        +---------+---------------+---------+-----------+----------+--------------+ FV Prox  Full                                                        +---------+---------------+---------+-----------+----------+--------------+ FV Mid   Full                                                        +---------+---------------+---------+-----------+----------+--------------+ FV DistalFull                                                        +---------+---------------+---------+-----------+----------+--------------+ PFV      Full                                                        +---------+---------------+---------+-----------+----------+--------------+  POP      Full           Yes      Yes                                 +---------+---------------+---------+-----------+----------+--------------+ PTV      Full                                                         +---------+---------------+---------+-----------+----------+--------------+ PERO     Full                                                        +---------+---------------+---------+-----------+----------+--------------+     Summary: BILATERAL: - No evidence of deep vein thrombosis seen in the lower extremities, bilaterally.  RIGHT: - No cystic structure found in the popliteal fossa.  LEFT: - No cystic structure found in the popliteal fossa.  *See table(s) above for measurements and observations. Electronically signed by Gretta Began MD on 10/04/2019 at 2:47:34 PM.    Final     Labs:  CBC: Recent Labs    10/02/19 1953 10/02/19 1953 10/03/19 1610 10/03/19 0231 10/04/19 0413 10/05/19 0819  WBC 14.1*  --  11.4*  --  15.0* 11.1*  HGB 14.7   < > 13.8 13.6 10.5* 9.2*  HCT 45.3   < > 40.9 40.0 33.6* 28.3*  PLT 366  --  356  --  290 243   < > = values in this interval not displayed.    COAGS: Recent Labs    10/02/19 1953  INR 0.9  APTT 27    BMP: Recent Labs    10/02/19 1953 10/02/19 1953 10/03/19 0212 10/03/19 0231 10/04/19 0413 10/05/19 0819  NA 140   < > 139 139 143 141  K 4.3   < > 4.1 3.8 3.6 3.3*  CL 107  --  108  --  112* 111  CO2 25  --  22  --  23 21*  GLUCOSE 113*  --  171*  --  110* 88  BUN 13  --  9  --  8 8  CALCIUM 9.2  --  8.9  --  8.2* 8.3*  CREATININE 0.88  --  0.79  --  0.79 0.71  GFRNONAA >60  --  >60  --  >60 >60  GFRAA >60  --  >60  --  >60 >60   < > = values in this interval not displayed.    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 Corliss Skains. Extubated yesterday.   Failed BSE, still with mild R facial droop and right-sided weakness, but  is alert and oriented.  R groin with some bruising after oozing from sheath.  Small area of firmness which  may represent fibrin/scar development.  Non-pulsatile. Mildly tender. Monitor for changes.   Continue taking Brilinta 90 mg twice daily and Aspirin 81 mg once daily. Obtain P2Y12 today. Further plans per neurology/CCM- appreciate and agree with management. NIR to follow.   Electronically Signed: Hoyt Koch, PA 10/05/2019, 11:43 AM   I spent a total of 25 Minutes at the the patient's bedside AND on the patient's hospital floor or unit, greater than 50% of which was counseling/coordinating care for CVA s/p revascularization.

## 2019-10-05 NOTE — Consult Note (Signed)
Physical Medicine and Rehabilitation Consult Reason for Consult: Right-sided weakness Referring Physician: Dr Roda Shutters   HPI: Erin Peck is a 54 y.o. right-handed female with history migrane headaches.  Per chart review patient lives with spouse independent prior to admission.  1 level home.  Presented 10/02/2019 [facility] with right-sided weakness and slurred speech.Cranial CT showed no acute changes.  Patient did receive TPA.  CTA of head and neck positive for emergent large vessel occlusion with occlusive thrombus involving the distal left M1 segment left MCA bifurcation.  Patient underwent revascularization per interventional radiology and was extubated 10/04/2019.  MRI of the brain showed left MCA infarct confluent at the posterior caudate and lentiform.  Chronic left corona radiata infarction.  Echocardiogram with ejection fraction of 65%.  Neurology follow-up currently on aspirin for CVA prophylaxis as well as Brilinta.  Subcutaneous Lovenox for DVT prophylaxis.  Bilateral lower extremity Dopplers negative.  ProAmatine was added for orthostasis as well as received normal saline bolus 10/04/2019.  Mechanical soft diet.  Plan for TEE once stable.  Therapy evaluations completed with recommendations of physical medicine rehab consult.   [name] was wondering if COULD go home for rehab.  Reinforced Only medical dx prior to migraine headaches, no HTN, no HLD, no DM.  Her most current K+ was 3.3; TSH 1.946; A1c was 5.3/ Goal of SBP is 120-160.  TEE and MBS are both planned for tomorrow.  Currently D3 diet with thin liquids  Review of Systems  Constitutional: Negative for fever.  HENT: Negative for hearing loss.   Eyes: Negative for blurred vision and double vision.  Respiratory: Negative for shortness of breath.   Cardiovascular: Negative for chest pain, palpitations and leg swelling.  Gastrointestinal: Positive for constipation. Negative for heartburn, nausea and vomiting.    Genitourinary: Negative for dysuria, flank pain and hematuria.  Musculoskeletal: Positive for myalgias.  Skin: Negative for rash.  Neurological: Positive for speech change and focal weakness.  All other systems reviewed and are negative.  History reviewed. No pertinent past medical history. Past Surgical History:  Procedure Laterality Date  . IR CT HEAD LTD  10/03/2019  . IR INTRA CRAN STENT  10/03/2019  . IR PATIENT EVAL TECH 0-60 MINS  10/03/2019  . IR PERCUTANEOUS ART THROMBECTOMY/INFUSION INTRACRANIAL INC DIAG ANGIO  10/03/2019  . RADIOLOGY WITH ANESTHESIA N/A 10/02/2019   Procedure: IR WITH ANESTHESIA;  Surgeon: Julieanne Cotton, MD;  Location: [facility] OR;  Service: Radiology;  Laterality: N/A;   History reviewed. No pertinent family history. Social History:  reports that she has never smoked. She has never used smokeless tobacco. She reports previous alcohol use. She reports previous drug use. Allergies: No Known Allergies Medications Prior to Admission  Medication Sig Dispense Refill  . ibuprofen (ADVIL) 200 MG tablet Take 200 mg by mouth every 6 (six) hours as needed for mild pain or moderate pain.      Home: Home Living Family/patient expects to be discharged to:: Private residence Living Arrangements: Spouse/significant other Available Help at Discharge: Family(per pt husband works-not sure where) Type of Home: House Home Access: Stairs to enter Secretary/administrator of Steps: 1 Home Layout: One level Bathroom Shower/Tub: Hydrographic surveyor, Sport and exercise psychologist: Standard Home Equipment: None  Lives With: Spouse  Functional History: Prior Function Level of Independence: Independent Functional Status:  Mobility: Bed Mobility Overal bed mobility: Needs Assistance Bed Mobility: Supine to Sit Supine to sit: Min guard, HOB elevated General bed mobility comments: [employer]  A for safety Transfers Overall transfer level: Needs assistance Equipment used: 2 person hand held  assist Transfers: Sit to/from Stand, Stand Pivot Transfers Sit to Stand: Mod assist, +2 physical assistance, +2 safety/equipment Stand pivot transfers: Mod assist, +2 physical assistance, +2 safety/equipment General transfer comment: Mod A +2 for power up and to correct right lateral lean. Mod A +2 to maintain balance during pivotal steps to recliner Ambulation/Gait Ambulation/Gait assistance: Mod assist, +2 physical assistance Gait Distance (Feet): 2 Feet Assistive device: 2 person hand held assist Gait Pattern/deviations: Step-to pattern, Decreased stride length General Gait Details: poor rt foot clearance; leaning rtwith fatigue    ADL: ADL Overall ADL's : Needs assistance/impaired Eating/Feeding: NPO Grooming: Oral care, Minimal assistance, Sitting Grooming Details (indicate cue type and reason): Pt requiring Min A for support at elbow to bring RUE to mouth and brush teeth. Pt following simple direct cues throughout task. Pt able to open tooth paste with significant time. Pt with decreased awareness/attention of RUE and dropping objects in RUE Upper Body Bathing: Minimal assistance, Sitting Lower Body Bathing: Moderate assistance, Sit to/from stand, Maximal assistance Upper Body Dressing : Minimal assistance, Sitting Lower Body Dressing: Moderate assistance, Sit to/from stand, Bed level, Minimal assistance Lower Body Dressing Details (indicate cue type and reason): Pt donning her socks while at bed level with HOB elevated for trunk support. Pt using figure four method. Pt with no using RUE to don socks on right foot and minimal use of RUE while donning left sock. Min A to incorporate RUE into task. Mod A +2 for standing balance Toilet Transfer: Moderate assistance, +2 for physical assistance, +2 for safety/equipment, Stand-pivot(simulated to recliner) Toilet Transfer Details (indicate cue type and reason): Mod A +2 for power up into standing and then maintain balance to pivot towards  recliner. Pt with heavy right lateral lean in standing Functional mobility during ADLs: Moderate assistance, +2 for physical assistance, +2 for safety/equipment(stand pivot only) General ADL Comments: Pt presenting with decreased functional use of RUE, cognition, attention to right, balance, strength, and safety  Cognition: Cognition Overall Cognitive Status: Impaired/Different from baseline Arousal/Alertness: Awake/alert Orientation Level: Oriented to person, Oriented to time, Disoriented to place, Disoriented to situation Memory: Impaired Memory Impairment: Storage deficit, Retrieval deficit, Decreased recall of new information Awareness: Impaired Awareness Impairment: Intellectual impairment, Emergent impairment Problem Solving: Impaired Problem Solving Impairment: Verbal basic Cognition Arousal/Alertness: Awake/alert Behavior During Therapy: Flat affect Overall Cognitive Status: Impaired/Different from baseline Area of Impairment: Orientation, Attention, Awareness, Memory, Following commands, Safety/judgement, Problem solving Orientation Level: Place, Situation(time NT) Current Attention Level: Sustained Memory: Decreased recall of precautions, Decreased short-term memory(and long-term memory) Following Commands: Follows one step commands consistently, Follows multi-step commands inconsistently, Follows one step commands with increased time Safety/Judgement: Decreased awareness of safety, Decreased awareness of deficits Awareness: (pre-intellectual) Problem Solving: Slow processing General Comments: not sure if she's right or left handed; stated she came to hospital because she fell; denies weakness on right side  Blood pressure 131/65, pulse 75, temperature 98.3 F (36.8 C), temperature source Oral, resp. rate 17, height 5\' 5"  (1.651 m), weight 74.1 kg, SpO2 95 %. Physical Exam  Nursing note and vitals reviewed. Constitutional: She appears well-developed and well-nourished.  Pt  asleep in bed; woke to daughter asking; not to me with verbals cues; daughter at bedside, NAD  HENT:  Head: Normocephalic and atraumatic.  Nose: Nose normal.  Mouth/Throat: Oropharynx is clear and moist. No oropharyngeal exudate.  Mild R facial droop; Facial sensation intact; tongue- midline.  Eyes: Pupils are equal, round, and reactive to light. Conjunctivae are normal. Right eye exhibits no discharge. Left eye exhibits no discharge.  EOMI B/L- no nystagmus seen today.   Neck: No tracheal deviation present.  Cardiovascular:  No JVD; RRR  Respiratory: No stridor.  CTA B/L- no W/R/R; no resp distress  GI:  Soft, NT< ND, (+)protuberant; hypoactive BS  Genitourinary:    Genitourinary Comments: purewick being placed   Musculoskeletal:     Cervical back: Normal range of motion and neck supple.     Comments: RUE deltoid, biceps, triceps, WE, grip and finger abd 4/5  LUE_ same muscles 5/5  RLE- HF, KE, KF, DF and PF 4/5 LLE- 5/5 in same muscles  Neurological:  Patient is a bit lethargic but arousable sitting up in bed.  She does make eye contact with examiner but would not initiate conversation.  She can provide some simple yes no answers as well as follow basic commands  Answered only yes or no- also tried to pronounce grandkid's names- not able to sensation intact to light touch in all 4 extremities No clonus in LEs; no increased tone;   Skin:  IVs in L hand and near L antecubital fossa- no signs of infiltrate  Psychiatric:  Calm     Results for orders placed or performed during the hospital encounter of 10/02/19 (from the past 24 hour(s))  CBC     Status: Abnormal   Collection Time: 10/05/19  8:19 AM  Result Value Ref Range   WBC 11.1 (H) 4.0 - 10.5 K/uL   RBC 2.99 (L) 3.87 - 5.11 MIL/uL   Hemoglobin 9.2 (L) 12.0 - 15.0 g/dL   HCT 58.5 (L) 27.7 - 82.4 %   MCV 94.6 80.0 - 100.0 fL   MCH 30.8 26.0 - 34.0 pg   MCHC 32.5 30.0 - 36.0 g/dL   RDW 23.5 36.1 - 44.3 %    Platelets 243 150 - 400 K/uL   nRBC 0.0 0.0 - 0.2 %  Basic metabolic panel     Status: Abnormal   Collection Time: 10/05/19  8:19 AM  Result Value Ref Range   Sodium 141 135 - 145 mmol/L   Potassium 3.3 (L) 3.5 - 5.1 mmol/L   Chloride 111 98 - 111 mmol/L   CO2 21 (L) 22 - 32 mmol/L   Glucose, Bld 88 70 - 99 mg/dL   BUN 8 6 - 20 mg/dL   Creatinine, Ser 1.54 0.44 - 1.00 mg/dL   Calcium 8.3 (L) 8.9 - 10.3 mg/dL   GFR calc non Af Amer >60 >60 mL/min   GFR calc Af Amer >60 >60 mL/min   Anion gap 9 5 - 15  Platelet inhibition p2y12 (Not at [facility])     Status: Abnormal   Collection Time: 10/05/19 12:05 PM  Result Value Ref Range   Platelet Function  P2Y12 39 (L) 182 - 335 PRU   CT ANGIO HEAD W OR WO CONTRAST  Result Date: 10/04/2019 CLINICAL DATA:  Stroke follow-up EXAM: CT ANGIOGRAPHY HEAD TECHNIQUE: Multidetector CT imaging of the head was performed using the standard protocol during bolus administration of intravenous contrast. Multiplanar CT image reconstructions and MIPs were obtained to evaluate the vascular anatomy. CONTRAST:  30mL OMNIPAQUE IOHEXOL 350 MG/ML SOLN COMPARISON:  Brain MRI from yesterday FINDINGS: CT HEAD Brain: Cytotoxic edema from infarct in the left basal ganglia and adjacent white matter, unchanged in extent from prior brain MRI. There is also pre-existing lacunar infarct in the  left frontal white matter. No hemorrhagic conversion or midline shift. Vascular: See below Skull: Negative Sinuses: Patchy chronic appearing sinusitis Orbits: Negative CTA HEAD Anterior circulation: The siphons are symmetrically patent. Y stent at the left M1 to M2 segments with nonocclusive thrombus seen in the lumen of the upper limb. The lower limb is well enhancing. Limited visualization at the margins of the stent due to artifact from radiopaque markers. Hypoplastic right A1 segment. Negative for aneurysm or right-sided embolic disease Posterior circulation: Right dominant vertebral artery. Basilar  is smooth and widely patent. No branch occlusion or beading. Negative for aneurysm. Venous sinuses: Diffusely patent Anatomic variants: None significant Call has been placed to the Neurology team. IMPRESSION: 1. Y stenting of the left M1 and M2 segments with nonocclusive in stent thrombus narrowing the upper limb. 2. Acute left MCA territory infarct is unchanged from brain MRI yesterday. Electronically Signed   By: Marnee Spring M.D.   On: 10/04/2019 08:21   MR ANGIO HEAD WO CONTRAST  Addendum Date: 10/03/2019   ADDENDUM REPORT: 10/03/2019 23:31 ADDENDUM: Left MCA findings discussed by telephone with Dr. Milon Dikes on 10/03/2019 at 2320 hours. And we discussed that a repeat CTA may be valuable to further quantify the Left MCA reperfusion. Electronically Signed   By: Odessa Fleming M.D.   On: 10/03/2019 23:31   Result Date: 10/03/2019 CLINICAL DATA:  54 year old female status post emergent large vessel occlusion, left M1 thrombus treated with endovascular reperfusion with rescue stenting. EXAM: MRI HEAD WITHOUT CONTRAST MRA HEAD WITHOUT CONTRAST TECHNIQUE: Multiplanar, multiecho pulse sequences of the brain and surrounding structures were obtained without intravenous contrast. Angiographic images of the head were obtained using MRA technique without contrast. COMPARISON:  CTA head and neck, CT Perfusion yesterday. FINDINGS: MRI HEAD FINDINGS Brain: Left MCA territory restricted diffusion, most confluent at the posterior left caudate and left lentiform, with additional scattered small cortical and subcortical areas around the left insula and left inferior frontal gyrus. This is similar to although not exactly corresponding to the predicted core infarct on CTP (19 mL on that exam). Associated T2 and FLAIR hyperintensity with no convincing hemorrhagic transformation. No mass effect. No contralateral right hemisphere or posterior fossa restricted diffusion. Scattered bilateral small and mostly subcortical white matter T2  and FLAIR hyperintense foci. 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 No midline shift, mass effect, evidence of mass lesion, ventriculomegaly, extra-axial collection or acute intracranial hemorrhage. Cervicomedullary junction and pituitary are within normal limits. Vascular: Major intracranial vascular flow voids are preserved. Skull and upper cervical spine: Negative visible cervical spine. Visualized bone marrow signal is within normal limits. Sinuses/Orbits: Negative orbits. Posterior ethmoid and sphenoid sinus mucosal thickening. Other: Layering fluid in the pharynx. Mastoids remain clear. The patient is intubated with an oral enteric tube also partially visible. MRA HEAD FINDINGS Antegrade flow in the posterior circulation with dominant right vertebral artery. Patent left vertebrobasilar junction. Normal right PICA origin. Patent basilar artery. Patent AICA, SCA and PCA origins. Both posterior communicating arteries are present. Bilateral PCA branches are within normal limits. Antegrade flow in both ICA siphons. No siphon stenosis identified. Ophthalmic and posterior communicating artery origins appear normal. Patent carotid termini. Patent MCA and ACA origins. The left ACA appears dominant. And there is a 2 mm infundibulum versus aneurysm directed superiorly from the anterior communicating artery region on series 2, image 93. But otherwise the visible ACA branches are within normal limits. Right MCA  M1 segment, right MCA bifurcation and visible right MCA branches are within normal limits. The left M1 was stented. The left MCA origin is patent, with loss of most of the left MCA flow signal, although there is flow signal detected at the left MCA bifurcation. However, there is a paucity of left MCA branches when compared to the right side. Furthermore, there is asymmetric FLAIR signal within left MCA M2 and M3 branches on the  anatomic images above. IMPRESSION: 1. The Left MCA was stented which is at least partially responsible for the decreased left M1 flow signal. However, decreased visualization of left MCA branches on MRA and increased branch FLAIR signal raises the possibility of residual hemodynamically significant MCA stenosis. 2. MRA is negative for other intracranial stenosis. 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 3. MRI reveals patchy left MCA infarcts most confluent at the posterior caudate and lentiform. Overall infarct volume probably similar to that predicted on CTP yesterday. 4. No intracranial mass effect or hemorrhagic transformation. 5. Chronic left corona radiata infarct. Electronically Signed: By: Odessa Fleming M.D. On: 10/03/2019 23:18   MR BRAIN WO CONTRAST  Addendum Date: 10/03/2019   ADDENDUM REPORT: 10/03/2019 23:31 ADDENDUM: Left MCA findings discussed by telephone with Dr. Milon Dikes on 10/03/2019 at 2320 hours. And we discussed that a repeat CTA may be valuable to further quantify the Left MCA reperfusion. Electronically Signed   By: Odessa Fleming M.D.   On: 10/03/2019 23:31   Result Date: 10/03/2019 CLINICAL DATA:  54 year old female status post emergent large vessel occlusion, left M1 thrombus treated with endovascular reperfusion with rescue stenting. EXAM: MRI HEAD WITHOUT CONTRAST MRA HEAD WITHOUT CONTRAST TECHNIQUE: Multiplanar, multiecho pulse sequences of the brain and surrounding structures were obtained without intravenous contrast. Angiographic images of the head were obtained using MRA technique without contrast. COMPARISON:  CTA head and neck, CT Perfusion yesterday. FINDINGS: MRI HEAD FINDINGS Brain: Left MCA territory restricted diffusion, most confluent at the posterior left caudate and left lentiform, with additional scattered small cortical and subcortical areas around the left insula and left inferior frontal gyrus. This is similar to although not  exactly corresponding to the predicted core infarct on CTP (19 mL on that exam). Associated T2 and FLAIR hyperintensity with no convincing hemorrhagic transformation. No mass effect. No contralateral right hemisphere or posterior fossa restricted diffusion. Scattered bilateral small and mostly subcortical white matter T2 and FLAIR hyperintense foci. 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 No midline shift, mass effect, evidence of mass lesion, ventriculomegaly, extra-axial collection or acute intracranial hemorrhage. Cervicomedullary junction and pituitary are within normal limits. Vascular: Major intracranial vascular flow voids are preserved. Skull and upper cervical spine: Negative visible cervical spine. Visualized bone marrow signal is within normal limits. Sinuses/Orbits: Negative orbits. Posterior ethmoid and sphenoid sinus mucosal thickening. Other: Layering fluid in the pharynx. Mastoids remain clear. The patient is intubated with an oral enteric tube also partially visible. MRA HEAD FINDINGS Antegrade flow in the posterior circulation with dominant right vertebral artery. Patent left vertebrobasilar junction. Normal right PICA origin. Patent basilar artery. Patent AICA, SCA and PCA origins. Both posterior communicating arteries are present. Bilateral PCA branches are within normal limits. Antegrade flow in both ICA siphons. No siphon stenosis identified. Ophthalmic and posterior communicating artery origins appear normal. Patent carotid termini. Patent MCA and ACA origins. The left ACA appears dominant. And there is a 2  mm infundibulum versus aneurysm directed superiorly from the anterior communicating artery region on series 2, image 93. But otherwise the visible ACA branches are within normal limits. Right MCA M1 segment, right MCA bifurcation and visible right MCA branches are within normal limits. The left M1 was stented.  The left MCA origin is patent, with loss of most of the left MCA flow signal, although there is flow signal detected at the left MCA bifurcation. However, there is a paucity of left MCA branches when compared to the right side. Furthermore, there is asymmetric FLAIR signal within left MCA M2 and M3 branches on the anatomic images above. IMPRESSION: 1. The Left MCA was stented which is at least partially responsible for the decreased left M1 flow signal. However, decreased visualization of left MCA branches on MRA and increased branch FLAIR signal raises the possibility of residual hemodynamically significant MCA stenosis. 2. MRA is negative for other intracranial stenosis. 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 3. MRI reveals patchy left MCA infarcts most confluent at the posterior caudate and lentiform. Overall infarct volume probably similar to that predicted on CTP yesterday. 4. No intracranial mass effect or hemorrhagic transformation. 5. Chronic left corona radiata infarct. Electronically Signed: By: Odessa FlemingH  Hall M.D. On: 10/03/2019 23:18   DG Chest Port 1 View  Result Date: 10/05/2019 CLINICAL DATA:  Acute respiratory failure with hypoxia. EXAM: PORTABLE CHEST 1 VIEW COMPARISON:  10/03/2019 FINDINGS: The endotracheal tube and NG tubes have been removed. The cardiac silhouette, mediastinal and hilar contours are normal. The lungs are clear. No pleural effusions. The bony thorax is intact. IMPRESSION: No acute cardiopulmonary findings. Electronically Signed   By: Rudie MeyerP.  Gallerani M.D.   On: 10/05/2019 08:26   DG Swallowing Func-Speech Pathology  Result Date: 10/05/2019 Objective Swallowing Evaluation: Type of Study: MBS-Modified Barium Swallow Study  Patient Details Name: Antony Salmonammy D Bouley MRN: [id number] Date of Birth: 17-Dec-1965 Today's Date: 10/05/2019 Time: SLP Start Time (ACUTE ONLY): 1323 -SLP Stop Time (ACUTE ONLY): 1334 SLP Time Calculation (min) (ACUTE ONLY):  11 min Past Medical History: No past medical history on file. Past Surgical History: Past Surgical History: Procedure Laterality Date . IR CT HEAD LTD  10/03/2019 . IR INTRA CRAN STENT  10/03/2019 . IR PATIENT EVAL TECH 0-60 MINS  10/03/2019 . IR PERCUTANEOUS ART THROMBECTOMY/INFUSION INTRACRANIAL INC DIAG ANGIO  10/03/2019 . RADIOLOGY WITH ANESTHESIA N/A 10/02/2019  Procedure: IR WITH ANESTHESIA;  Surgeon: Julieanne Cottoneveshwar, Sanjeev, MD;  Location: [facility] OR;  Service: Radiology;  Laterality: N/A; HPI: 54 year old female with acute Left MCA M1 occlusion s/p tPA and mechanical thrombectomy returning to ICU. Intubated 3/8-3/10.  Subjective: alert, pleasant, not very communicative Assessment / Plan / Recommendation CHL IP CLINICAL IMPRESSIONS 10/05/2019 Clinical Impression Pt demonstrates a primary oral dysphagia with mild right anterior labial weakness and decreased lingual strength. Pt has mild anterior bolus loss with thin via cup, better containment with a straw. Pt also has mild residue on the lingual body that spills to pharynx post swallow, resulting in some instances of trace spillage to pyrifomr sinuses or even very trace penetration, all sensed and cleared with a second swallow. Pt able to masticate soft solids and again clears residue with a second swallow. Recommend dys 3 mech soft given UE weakness and potential difficulty cutting and preparing foods. Thin liquids. SLP will f/u for tolerance.  SLP Visit Diagnosis Dysphagia, unspecified (R13.10) Attention and concentration deficit following -- Frontal lobe and executive function  deficit following -- Impact on safety and function Mild aspiration risk   CHL IP TREATMENT RECOMMENDATION 10/05/2019 Treatment Recommendations Therapy as outlined in treatment plan below   Prognosis 10/04/2019 Prognosis for Safe Diet Advancement Good Barriers to Reach Goals -- Barriers/Prognosis Comment -- CHL IP DIET RECOMMENDATION 10/05/2019 SLP Diet Recommendations Dysphagia 3 (Mech soft) solids;Thin  liquid Liquid Administration via Straw Medication Administration Whole meds with puree Compensations Slow rate;Small sips/bites Postural Changes Remain semi-upright after after feeds/meals (Comment);Seated upright at 90 degrees   CHL IP OTHER RECOMMENDATIONS 10/05/2019 Recommended Consults -- Oral Care Recommendations Oral care BID Other Recommendations --   CHL IP FOLLOW UP RECOMMENDATIONS 10/05/2019 Follow up Recommendations Inpatient Rehab   CHL IP FREQUENCY AND DURATION 10/05/2019 Speech Therapy Frequency (ACUTE ONLY) min 2x/week Treatment Duration --      CHL IP ORAL PHASE 10/05/2019 Oral Phase Impaired Oral - Pudding Teaspoon -- Oral - Pudding Cup -- Oral - Honey Teaspoon -- Oral - Honey Cup -- Oral - Nectar Teaspoon -- Oral - Nectar Cup -- Oral - Nectar Straw -- Oral - Thin Teaspoon -- Oral - Thin Cup Right anterior bolus loss Oral - Thin Straw Weak lingual manipulation;Lingual/palatal residue Oral - Puree Lingual/palatal residue Oral - Mech Soft -- Oral - Regular Lingual/palatal residue Oral - Multi-Consistency -- Oral - Pill Decreased bolus cohesion Oral Phase - Comment --  CHL IP PHARYNGEAL PHASE 10/05/2019 Pharyngeal Phase WFL Pharyngeal- Pudding Teaspoon -- Pharyngeal -- Pharyngeal- Pudding Cup -- Pharyngeal -- Pharyngeal- Honey Teaspoon -- Pharyngeal -- Pharyngeal- Honey Cup -- Pharyngeal -- Pharyngeal- Nectar Teaspoon -- Pharyngeal -- Pharyngeal- Nectar Cup -- Pharyngeal -- Pharyngeal- Nectar Straw -- Pharyngeal -- Pharyngeal- Thin Teaspoon -- Pharyngeal -- Pharyngeal- Thin Cup -- Pharyngeal -- Pharyngeal- Thin Straw -- Pharyngeal -- Pharyngeal- Puree -- Pharyngeal -- Pharyngeal- Mechanical Soft -- Pharyngeal -- Pharyngeal- Regular -- Pharyngeal -- Pharyngeal- Multi-consistency -- Pharyngeal -- Pharyngeal- Pill -- Pharyngeal -- Pharyngeal Comment --  No flowsheet data found. Harlon Ditty, MA CCC-SLP Acute Rehabilitation Services Pager [phone] Office [phone] Claudine Mouton 10/05/2019,  1:42 PM              ECHOCARDIOGRAM COMPLETE  Result Date: 10/03/2019    ECHOCARDIOGRAM REPORT   Patient Name:   XAVIER FOURNIER [facility] Date of Exam: 10/03/2019 Medical Rec #:  [id number]       Height:       65.0 in Accession #:    [id number]      Weight:       163.4 lb Date of Birth:  September 28, 1965      BSA:          1.815 m Patient Age:    53 years        BP:           116/62 mmHg Patient Gender: F               HR:           93 bpm. Exam Location:  Inpatient Procedure: 2D Echo, Cardiac Doppler and Color Doppler Indications:   CVA  History:       Patient has no prior history of Echocardiogram examinations.                Stroke.  Sonographer:   Thurman Coyer RDCS (AE) Referring      [id number] ASHISH ARORA Phys: IMPRESSIONS  1. Left ventricular ejection fraction, by estimation, is 60 to 65%. The left ventricle has normal function. The left ventricle  has no regional wall motion abnormalities. Left ventricular diastolic parameters were normal.  2. Right ventricular systolic function is normal. The right ventricular size is normal.  3. The mitral valve is normal in structure. No evidence of mitral valve regurgitation. No evidence of mitral stenosis.  4. The aortic valve is normal in structure. Aortic valve regurgitation is not visualized. No aortic stenosis is present.  5. The inferior vena cava is normal in size with greater than 50% respiratory variability, suggesting right atrial pressure of 3 mmHg. Comparison(s): No prior Echocardiogram. FINDINGS  Left Ventricle: Left ventricular ejection fraction, by estimation, is 60 to 65%. The left ventricle has normal function. The left ventricle has no regional wall motion abnormalities. The left ventricular internal cavity size was normal in size. There is  no left ventricular hypertrophy. Left ventricular diastolic parameters were normal. Normal left ventricular filling pressure. Right Ventricle: The right ventricular size is normal. No increase in right ventricular wall  thickness. Right ventricular systolic function is normal. Left Atrium: Left atrial size was normal in size. Right Atrium: Right atrial size was normal in size. Pericardium: There is no evidence of pericardial effusion. Mitral Valve: The mitral valve is normal in structure. Normal mobility of the mitral valve leaflets. No evidence of mitral valve regurgitation. No evidence of mitral valve stenosis. Tricuspid Valve: The tricuspid valve is normal in structure. Tricuspid valve regurgitation is not demonstrated. No evidence of tricuspid stenosis. Aortic Valve: The aortic valve is normal in structure. Aortic valve regurgitation is not visualized. No aortic stenosis is present. Pulmonic Valve: The pulmonic valve was normal in structure. Pulmonic valve regurgitation is not visualized. No evidence of pulmonic stenosis. Aorta: The aortic root is normal in size and structure. Venous: The inferior vena cava is normal in size with greater than 50% respiratory variability, suggesting right atrial pressure of 3 mmHg. IAS/Shunts: No atrial level shunt detected by color flow Doppler.  LEFT VENTRICLE PLAX 2D LVIDd:         3.86 cm  Diastology LVIDs:         2.44 cm  LV e' lateral:   9.36 cm/s LV PW:         0.78 cm  LV E/e' lateral: 9.3 LV IVS:        0.90 cm  LV e' medial:    8.59 cm/s LVOT diam:     2.00 cm  LV E/e' medial:  10.1 LV SV:         76 LV SV Index:   42 LVOT Area:     3.14 cm  RIGHT VENTRICLE RV S prime:     15.80 cm/s TAPSE (M-mode): 2.5 cm LEFT ATRIUM             Index       RIGHT ATRIUM          Index LA diam:        3.20 cm 1.76 cm/m  RA Area:     9.71 cm LA Vol (A2C):   26.7 ml 14.71 ml/m RA Volume:   17.60 ml 9.70 ml/m LA Vol (A4C):   33.7 ml 18.57 ml/m LA Biplane Vol: 31.1 ml 17.13 ml/m  AORTIC VALVE LVOT Vmax:   110.00 cm/s LVOT Vmean:  74.700 cm/s LVOT VTI:    0.241 m  AORTA Ao Root diam: 2.60 cm MITRAL VALVE MV Area (PHT): 3.37 cm    SHUNTS MV Decel Time: 225 msec    Systemic VTI:  0.24 m MV E  velocity: 86.90 cm/s  Systemic Diam: 2.00 cm MV A velocity: 88.00 cm/s MV E/A ratio:  0.99 Mihai Croitoru MD Electronically signed by Thurmon Fair MD Signature Date/Time: 10/03/2019/3:15:50 PM    Final    VAS [place] LOWER EXTREMITY VENOUS (DVT)  Result Date: 10/04/2019  Lower Venous DVTStudy Indications: Stroke.  Comparison Study: No prior exam. Performing Technologist: Kennedy Bucker ARDMS, RVT  Examination Guidelines: A complete evaluation includes B-mode imaging, spectral Doppler, color Doppler, and power Doppler as needed of all accessible portions of each vessel. Bilateral testing is considered an integral part of a complete examination. Limited examinations for reoccurring indications may be performed as noted. The reflux portion of the exam is performed with the patient in reverse Trendelenburg.  +---------+---------------+---------+-----------+----------+-------------------+ RIGHT    CompressibilityPhasicitySpontaneityPropertiesThrombus Aging      +---------+---------------+---------+-----------+----------+-------------------+ CFV      Full           Yes      Yes                                      +---------+---------------+---------+-----------+----------+-------------------+ SFJ      Full                                                             +---------+---------------+---------+-----------+----------+-------------------+ FV Prox  Full                                                             +---------+---------------+---------+-----------+----------+-------------------+ FV Mid   Full                                                             +---------+---------------+---------+-----------+----------+-------------------+ FV DistalFull                                                             +---------+---------------+---------+-----------+----------+-------------------+ PFV                                                   not visualized due                                                         to bandage          +---------+---------------+---------+-----------+----------+-------------------+ POP      Full  Yes      Yes                                      +---------+---------------+---------+-----------+----------+-------------------+ PTV      Full                                                             +---------+---------------+---------+-----------+----------+-------------------+ PERO     Full                                                             +---------+---------------+---------+-----------+----------+-------------------+   +---------+---------------+---------+-----------+----------+--------------+ LEFT     CompressibilityPhasicitySpontaneityPropertiesThrombus Aging +---------+---------------+---------+-----------+----------+--------------+ CFV      Full           Yes      Yes                                 +---------+---------------+---------+-----------+----------+--------------+ SFJ      Full                                                        +---------+---------------+---------+-----------+----------+--------------+ FV Prox  Full                                                        +---------+---------------+---------+-----------+----------+--------------+ FV Mid   Full                                                        +---------+---------------+---------+-----------+----------+--------------+ FV DistalFull                                                        +---------+---------------+---------+-----------+----------+--------------+ PFV      Full                                                        +---------+---------------+---------+-----------+----------+--------------+ POP      Full           Yes      Yes                                 +---------+---------------+---------+-----------+----------+--------------+  PTV       Full                                                        +---------+---------------+---------+-----------+----------+--------------+ PERO     Full                                                        +---------+---------------+---------+-----------+----------+--------------+     Summary: BILATERAL: - No evidence of deep vein thrombosis seen in the lower extremities, bilaterally.  RIGHT: - No cystic structure found in the popliteal fossa.  LEFT: - No cystic structure found in the popliteal fossa.  *See table(s) above for measurements and observations. Electronically signed by Gretta Began MD on 10/04/2019 at 2:47:34 PM.    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 Does the need for close, 24 hr/day medical supervision in concert with the patient's rehab needs make it unreasonable for this patient to be served in a less intensive setting? Potentially 2. Co-Morbidities requiring supervision/potential complications: migraine, dysphagia, aphasia; HTN 3. Due to bladder management, bowel management, safety, skin/wound care, disease management, medication administration and patient education, does the patient require 24 hr/day rehab nursing? Yes 4. Does the patient require coordinated care of a physician, rehab nurse, therapy disciplines of PT, OT and SLP to address physical and functional deficits in the context of the above medical diagnosis(es)? Yes Addressing deficits in the following areas: balance, endurance, locomotion, strength, transferring, bathing, dressing, feeding, grooming, toileting, cognition, speech, language, swallowing and psychosocial support 5. Can the patient actively participate in an intensive therapy program of at least 3 hrs of therapy per day at least 5 days per week? Yes 6. The potential for patient to make measurable gains while on inpatient rehab is excellent 7. Anticipated functional outcomes upon discharge from  inpatient rehab are modified independent  with PT, modified independent with OT, supervision and min assist with SLP. 8. Estimated rehab length of stay to reach the above functional goals is: 7-10 days 9. Anticipated discharge destination: Home 10. Overall Rehab/Functional Prognosis: excellent  RECOMMENDATIONS: This patient's condition is appropriate for continued rehabilitative care in the following setting: CIR Patient has agreed to participate in recommended program. Potentially Note that insurance prior authorization may be required for reimbursement for recommended care.  Comment:  1. Pt wants to go home, however I explained I don't think she's safe to go home with H/H therapies at this time- she MIGHT by d/c, but she would need to make some gains- explained to daughter it's 3 hours/therapy per week vs 18+ hours/week in CIR.  2. Also suggest Amantadine 100 mg daily for aphasia to be started for possible aphasia recovery.   3. Know primary team plans on adjusting her K+.  4. TEE and MBS planned for tomorrow.  5. Will submit for possible insurance approval and see if pt still not safe for d/c by d/c- if so, needs to come to CIR 6. Thank you for this consult.   Mcarthur Rossetti Angiulli, PA-C 10/05/2019   I have personally performed a face to face diagnostic evaluation of this patient and  formulated the key components of the plan.  Additionally, I have personally reviewed laboratory data, imaging studies, as well as relevant notes and concur with the physician assistant's documentation above.   The patient's status has not changed from the original consult.  Any changes in documentation from the acute care chart have been noted above.

## 2019-10-05 NOTE — Progress Notes (Signed)
OT Cancellation Note  Patient Details Name: Erin Peck MRN: [id number] DOB: [birth year]   Cancelled Treatment:    Reason Eval/Treat Not Completed: Active bedrest order. Will return as schedule allows. Thanks!  [name] M [name]  .[name] MSOT, OTR/L Acute Rehab Pager: [phone] Office: [phone]  10/05/2019, 8:02 AM

## 2019-10-05 NOTE — Evaluation (Signed)
Physical Therapy Evaluation Patient Details Name: Erin Peck MRN: [id number] DOB: 21-Mar-1966 Today's Date: 10/05/2019   History of Present Illness  54 year old female with acute Left MCA M1 occlusion s/p tPA and mechanical thrombectomy returning to ICU. Intubated 3/8-3/10. No PMHx on file on admission.  Clinical Impression   Pt admitted with above diagnosis. Patient with rt hemiparesis resulting in need for up to moderate assistance of 2 people for basic mobility. Patient with rt lean and decr attention to rt side. She was not able to tell us what work her husband does or if there is other family support available. Pt currently with functional limitations due to the deficits listed below (see PT Problem List). Pt will benefit from skilled PT to increase their independence and safety with mobility to allow discharge to the venue listed below.       Follow Up Recommendations CIR;Supervision/Assistance - 24 hour    Equipment Recommendations  Other (comment)(TBA next venue)    Recommendations for Other Services Rehab consult     Precautions / Restrictions Precautions Precautions: Fall Restrictions Weight Bearing Restrictions: No      Mobility  Bed Mobility Overal bed mobility: Needs Assistance Bed Mobility: Supine to Sit     Supine to sit: Min guard;HOB elevated     General bed mobility comments: Min Guard A for safety  Transfers Overall transfer level: Needs assistance Equipment used: 2 person hand held assist Transfers: Sit to/from [employer] Sit to Stand: Mod assist;+2 physical assistance;+2 safety/equipment Stand pivot transfers: Mod assist;+2 physical assistance;+2 safety/equipment       General transfer comment: Mod A +2 for power up and to correct right lateral lean. Mod A +2 to maintain balance during pivotal steps to recliner  Ambulation/Gait Ambulation/Gait assistance: Mod assist;+2 physical assistance Gait Distance (Feet): 2  Feet Assistive device: 2 person hand held assist Gait Pattern/deviations: Step-to pattern;Decreased stride length     General Gait Details: poor rt foot clearance; leaning rtwith fatigue  Stairs            Wheelchair Mobility    Modified Rankin (Stroke Patients Only) Modified Rankin (Stroke Patients Only) Pre-Morbid Rankin Score: No symptoms Modified Rankin: Moderately severe disability     Balance Overall balance assessment: Needs assistance Sitting-balance support: No upper extremity supported;Feet supported Sitting balance-Leahy Scale: Fair     Standing balance support: Bilateral upper extremity supported;During functional activity Standing balance-Leahy Scale: Poor Standing balance comment: Reliant on physical A                             Pertinent Vitals/Pain Pain Assessment: No/denies pain    Home Living Family/patient expects to be discharged to:: Private residence Living Arrangements: Spouse/significant other Available Help at Discharge: Family(per pt husband works-not sure where) Type of Home: House Home Access: Stairs to enter   Secretary/administrator of Steps: 1 Home Layout: One level Home Equipment: None      Prior Function Level of Independence: Independent               Hand Dominance   Dominant Hand: Right(appears to be; pt could not report)    Extremity/Trunk Assessment   Upper Extremity Assessment Upper Extremity Assessment: Defer to OT evaluation RUE Deficits / Details: Decreased activity movement. Forward flexion of shoulder 0-70*. Able to perform AROM of elbow, wrist, and hand. Performing finger opposition slowly. Increased edema noted. Pt able to maintain grasp and take tooth  paste cap off of tube with significant time. RUE Coordination: decreased gross motor    Lower Extremity Assessment Lower Extremity Assessment: RLE deficits/detail RLE Deficits / Details: mild synergistic pattern (ER, abd, flex); grossly 3+  to 4 throughout RLE Sensation: (denies changes; ?accuracy) LLE Deficits / Details: WFL    Cervical / Trunk Assessment Cervical / Trunk Assessment: Other exceptions Cervical / Trunk Exceptions: obesity  Communication   Communication: Other (comment)(voice very weak)  Cognition Arousal/Alertness: Awake/alert Behavior During Therapy: Flat affect Overall Cognitive Status: Impaired/Different from baseline Area of Impairment: Orientation;Attention;Awareness;Memory;Following commands;Safety/judgement;Problem solving                 Orientation Level: Place;Situation(time NT) Current Attention Level: Sustained Memory: Decreased recall of precautions;Decreased short-term memory(and long-term memory) Following Commands: Follows one step commands consistently;Follows multi-step commands inconsistently;Follows one step commands with increased time Safety/Judgement: Decreased awareness of safety;Decreased awareness of deficits Awareness: (pre-intellectual) Problem Solving: Slow processing General Comments: not sure if she's right or left handed; stated she came to hospital because she fell; denies weakness on right side      General Comments General comments (skin integrity, edema, etc.): VSS on RA throughout    Exercises     Assessment/Plan    PT Assessment Patient needs continued PT services  PT Problem List Decreased strength;Decreased balance;Decreased mobility;Decreased cognition;Decreased knowledge of use of DME;Decreased safety awareness;Impaired tone;Obesity       PT Treatment Interventions DME instruction;Gait training;Functional mobility training;Therapeutic activities;Balance training;Neuromuscular re-education;Cognitive remediation;Patient/family education    PT Goals (Current goals can be found in the Care Plan section)  Acute Rehab PT Goals Patient Stated Goal: Get better PT Goal Formulation: With patient Time For Goal Achievement: 10/19/19 Potential to Achieve  Goals: Good    Frequency Min 4X/week   Barriers to discharge        Co-evaluation PT/OT/SLP Co-Evaluation/Treatment: Yes Reason for Co-Treatment: For patient/therapist safety;To address functional/ADL transfers PT goals addressed during session: Mobility/safety with mobility;Balance OT goals addressed during session: ADL's and self-care       AM-PAC PT "6 Clicks" Mobility  Outcome Measure Help needed turning from your back to your side while in a flat bed without using bedrails?: A Little Help needed moving from lying on your back to sitting on the side of a flat bed without using bedrails?: A Little Help needed moving to and from a bed to a chair (including a wheelchair)?: A Lot Help needed standing up from a chair using your arms (e.g., wheelchair or bedside chair)?: A Lot Help needed to walk in hospital room?: A Lot Help needed climbing 3-5 steps with a railing? : Total 6 Click Score: 13    End of Session Equipment Utilized During Treatment: Gait belt Activity Tolerance: Patient tolerated treatment well Patient left: in chair;with call bell/phone within reach;with chair alarm set Nurse Communication: Mobility status;Other (comment)(IV rt hand came out) PT Visit Diagnosis: Hemiplegia and hemiparesis;Other abnormalities of gait and mobility (R26.89) Hemiplegia - Right/Left: Right Hemiplegia - dominant/non-dominant: Dominant Hemiplegia - caused by: Cerebral infarction    Time: 1155-1230 PT Time Calculation (min) (ACUTE ONLY): 35 min   Charges:   PT Evaluation $PT Eval Low Complexity: 1 Low           Arby Barrette, PT Pager [phone]   Rexanne Mano 10/05/2019, 2:41 PM

## 2019-10-05 NOTE — Progress Notes (Signed)
  Speech Language Pathology Treatment: Dysphagia  Patient Details Name: Erin Peck MRN: [id number] DOB: [birth year] Today's Date: 10/05/2019 Time: 3244-0102 SLP Time Calculation (min) (ACUTE ONLY): 24 min  Assessment / Plan / Recommendation Clinical Impression  Pt has improving vocal quality, although still needing cues to try to increase volume for intelligibility. Despite being soft spoken, the quality of her voice is clearer today. She has less overt s/s of aspiration but does cough when struggling to clear a bite of graham cracker from her mouth and again when challenged with three ounces of water. Mild anterior spillage is noted on pt's right side. Given reduced but persistent symptoms of dysphagia with possible aspiration, recommend proceeding with MBS, tentatively planned for this afternoon with radiology. Would continue to offer ice chips and meds crushed in puree pending completion.    HPI HPI: 54 year old female with acute Left MCA M1 occlusion s/p tPA and mechanical thrombectomy returning to ICU. Intubated 3/8-3/10.      SLP Plan  MBS       Recommendations  Diet recommendations: NPO;Other(comment)(ice chips after oral care) Medication Administration: Crushed with puree                Oral Care Recommendations: Oral care QID Follow up Recommendations: (tba) SLP Visit Diagnosis: Dysphagia, oropharyngeal phase (R13.12) Plan: MBS       GO                 Mahala Menghini., M.A. CCC-SLP Acute Rehabilitation Services Pager [phone] Office [phone]  10/05/2019, 10:49 AM

## 2019-10-05 NOTE — Evaluation (Signed)
Occupational Therapy Evaluation Patient Details Name: Erin Peck MRN: [id number] DOB: 1965/09/19 Today's Date: 10/05/2019    History of Present Illness 54 year old female with acute Left MCA M1 occlusion s/p tPA and mechanical thrombectomy returning to ICU. Intubated 3/8-3/10. No PMHx on file on admission.   Clinical Impression   PTA, pt was living with her husband and was independent. Pt currently requiring Min A for UB ADLs, Mod-Max A for LB ADLs, and Mod A +2 for functional transfers. Pt with decreased functional use of dominant hand, cognition, attention to right, strength, balance, and safety. Pt motivated to participate in therapy. Pt will require further acute OT to facilitate safe dc. Due to pt's age, PLOF, and motivation, recommend dc to CIR for intensive OT to optimize safety, independence with ADLs, and return to PLOF.      Follow Up Recommendations  CIR;Supervision/Assistance - 24 hour    Equipment Recommendations  Other (comment)(Defer to next venue)    Recommendations for Other Services PT consult;Rehab consult;Speech consult     Precautions / Restrictions Precautions Precautions: Fall      Mobility Bed Mobility Overal bed mobility: Needs Assistance Bed Mobility: Supine to Sit     Supine to sit: Min guard;HOB elevated     General bed mobility comments: Min Guard A for safety  Transfers Overall transfer level: Needs assistance Equipment used: 2 person hand held assist Transfers: Sit to/from [employer] Sit to Stand: Mod assist;+2 physical assistance;+2 safety/equipment Stand pivot transfers: Mod assist;+2 physical assistance;+2 safety/equipment       General transfer comment: Mod A +2 for power up and to correct right lateral lean. Mod A +2 to maintain balance during pivot to recliner    Balance Overall balance assessment: Needs assistance Sitting-balance support: No upper extremity supported;Feet supported Sitting balance-Leahy  Scale: Fair     Standing balance support: Bilateral upper extremity supported;During functional activity Standing balance-Leahy Scale: Poor Standing balance comment: Reliant on physical A                           ADL either performed or assessed with clinical judgement   ADL Overall ADL's : Needs assistance/impaired Eating/Feeding: NPO   Grooming: Oral care;Minimal assistance;Sitting Grooming Details (indicate cue type and reason): Pt requiring Min A for support at elbow to bring Erin to mouth and brush teeth. Pt following simple direct cues throughout task. Pt able to open tooth paste with significant time. Pt with decreased awareness/attention of Erin and dropping objects in Erin Upper Body Bathing: Minimal assistance;Sitting   Lower Body Bathing: Moderate assistance;Sit to/from stand;Maximal assistance   Upper Body Dressing : Minimal assistance;Sitting   Lower Body Dressing: Moderate assistance;Sit to/from stand;Bed level;Minimal assistance Lower Body Dressing Details (indicate cue type and reason): Pt donning her socks while at bed level with HOB elevated for trunk support. Pt using figure four method. Pt with no using Erin to don socks on right foot and minimal use of Erin while donning left sock. Min A to incorporate Erin into task. Mod A +2 for standing balance Toilet Transfer: Moderate assistance;+2 for physical assistance;+2 for safety/equipment;Stand-pivot(simulated to recliner) Toilet Transfer Details (indicate cue type and reason): Mod A +2 for power up into standing and then maintain balance to pivot towards recliner. Pt with heavy right lateral lean in standing         Functional mobility during ADLs: Moderate assistance;+2 for physical assistance;+2 for safety/equipment(stand pivot only) General ADL  Comments: Pt presenting with decreased functional use of Erin, cognition, attention to right, balance, strength, and safety     Vision         Perception      Praxis      Pertinent Vitals/Pain Pain Assessment: No/denies pain     Hand Dominance Right   Extremity/Trunk Assessment Upper Extremity Assessment Upper Extremity Assessment: Erin deficits/detail Erin Deficits / Details: Decreased activity movement. Forward flexion of shoulder 0-70*. Able to perform AROM of elbow, wrist, and hand. Performing finger opposition slowly. Increased edema noted. Pt able to maintain grasp and take tooth paste cap off of tube with significant time. Erin Coordination: decreased gross motor   Lower Extremity Assessment Lower Extremity Assessment: Defer to PT evaluation RLE Deficits / Details: mild synergistic pattern (ER, abd, flex); grossly 3+ to 4 throughout RLE Sensation: (denies changes; ?accuracy) LLE Deficits / Details: WFL   Cervical / Trunk Assessment Cervical / Trunk Assessment: Other exceptions Cervical / Trunk Exceptions: obesity   Communication Communication Communication: Other (comment)(voice very weak)   Cognition Arousal/Alertness: Awake/alert Behavior During Therapy: Flat affect Overall Cognitive Status: Impaired/Different from baseline Area of Impairment: Orientation;Attention;Awareness;Memory;Following commands;Safety/judgement;Problem solving                 Orientation Level: Place;Situation(time NT) Current Attention Level: Sustained Memory: Decreased recall of precautions;Decreased short-term memory(and long-term memory) Following Commands: Follows one step commands consistently;Follows multi-step commands inconsistently;Follows one step commands with increased time Safety/Judgement: Decreased awareness of safety;Decreased awareness of deficits Awareness: (pre-intellectual) Problem Solving: Slow processing General Comments: not sure if she's right or left handed; stated she came to hospital because she fell; denies weakness on right side   General Comments  VSS on RA throughout    Exercises     Shoulder Instructions       Home Living Family/patient expects to be discharged to:: Private residence Living Arrangements: Spouse/significant other Available Help at Discharge: Family(per pt husband works-not sure where) Type of Home: House Home Access: Stairs to enter [employer] of Steps: 1   Home Layout: One level     Bathroom Shower/Tub: Tub/shower unit;Door   [employer]: Standard     Home Equipment: None      Lives With: Spouse    Prior Functioning/Environment Level of Independence: Independent                 OT Problem List: Decreased strength;Decreased range of motion;Impaired balance (sitting and/or standing);Decreased activity tolerance;Decreased coordination;Decreased cognition;Decreased safety awareness;Decreased knowledge of use of DME or AE;Decreased knowledge of precautions;Impaired UE functional use;Pain      OT Treatment/Interventions: Self-care/ADL training;Therapeutic exercise;Energy conservation;DME and/or AE instruction;Therapeutic activities;Patient/family education    OT Goals(Current goals can be found in the care plan section) Acute Rehab OT Goals Patient Stated Goal: Get better OT Goal Formulation: With patient Time For Goal Achievement: 10/19/19 Potential to Achieve Goals: Good  OT Frequency: Min 3X/week   Barriers to D/C:            Co-evaluation PT/OT/SLP Co-Evaluation/Treatment: Yes Reason for Co-Treatment: For patient/therapist safety;To address functional/ADL transfers PT goals addressed during session: Mobility/safety with mobility;Balance OT goals addressed during session: ADL's and self-care      AM-PAC OT "6 Clicks" Daily Activity     Outcome Measure Help from another person eating meals?: Total Help from another person taking care of personal grooming?: A Little Help from another person toileting, which includes using toliet, bedpan, or urinal?: A Lot Help from another person bathing (including washing,  rinsing, drying)?: A  Lot Help from another person to put on and taking off regular upper body clothing?: A Little Help from another person to put on and taking off regular lower body clothing?: A Lot 6 Click Score: 13   End of Session Equipment Utilized During Treatment: Gait belt Nurse Communication: Mobility status  Activity Tolerance: Patient tolerated treatment well Patient left: in chair;with call bell/phone within reach;with chair alarm set  OT Visit Diagnosis: Unsteadiness on feet (R26.81);Other abnormalities of gait and mobility (R26.89);Muscle weakness (generalized) (M62.81);Hemiplegia and hemiparesis Hemiplegia - Right/Left: Right Hemiplegia - dominant/non-dominant: Dominant Hemiplegia - caused by: Cerebral infarction                Time: 1152-1229 OT Time Calculation (min): 37 min Charges:  OT General Charges $OT Visit: 1 Visit OT Evaluation $OT Eval Moderate Complexity: 1 Mod  Derold Dorsch MSOT, OTR/L Acute Rehab Pager: [phone] Office: [place] 10/05/2019, 2:14 PM

## 2019-10-05 NOTE — Progress Notes (Signed)
STROKE TEAM PROGRESS NOTE   INTERVAL HISTORY Daughter at bedside.  Patient was extubated yesterday, awake alert, orientated, mild dysarthria but no aphasia.  Still has right hemiparesis, stable, PT/OT recommend inpatient rehab.  However, she did not pass swallow, will do barium study tomorrow.  Also plan for TEE tomorrow.  Glucose level 88, put on D5 normal saline.  Vitals:   10/05/19 0500 10/05/19 0600 10/05/19 0700 10/05/19 0800  BP: 107/90 130/73 125/60 128/64  Pulse: 93 77 83 73  Resp: (!) 28 (!) 23 (!) 24 (!) 21  Temp:    98.2 F (36.8 C)  TempSrc:    Oral  SpO2: 92% 92% 92% 93%  Weight:      Height:        CBC:  Recent Labs  Lab 10/02/19 1953 10/02/19 1953 10/03/19 0212 10/03/19 0212 10/03/19 0231 10/04/19 0413  WBC 14.1*   < > 11.4*  --   --  15.0*  NEUTROABS 10.2*  --  9.4*  --   --   --   HGB 14.7   < > 13.8   < > 13.6 10.5*  HCT 45.3   < > 40.9   < > 40.0 33.6*  MCV 94.2   < > 92.1  --   --  98.0  PLT 366   < > 356  --   --  290   < > = values in this interval not displayed.    Basic Metabolic Panel:  Recent Labs  Lab 10/03/19 0212 10/03/19 0212 10/03/19 0231 10/04/19 0413  NA 139   < > 139 143  K 4.1   < > 3.8 3.6  CL 108  --   --  112*  CO2 22  --   --  23  GLUCOSE 171*  --   --  110*  BUN 9  --   --  8  CREATININE 0.79  --   --  0.79  CALCIUM 8.9  --   --  8.2*   < > = values in this interval not displayed.   Lipid Panel:     Component Value Date/Time   CHOL 236 (H) 10/03/2019 0212   TRIG 187 (H) 10/03/2019 0555   HDL 49 10/03/2019 0212   CHOLHDL 4.8 10/03/2019 0212   VLDL 35 10/03/2019 0212   LDLCALC 152 (H) 10/03/2019 0212   HgbA1c:  Lab Results  Component Value Date   HGBA1C 5.3 10/03/2019   Urine Drug Screen:     Component Value Date/Time   LABOPIA NONE DETECTED 10/03/2019 0937   COCAINSCRNUR NONE DETECTED 10/03/2019 0937   LABBENZ NONE DETECTED 10/03/2019 0937   AMPHETMU POSITIVE (A) 10/03/2019 0937   THCU NONE DETECTED  10/03/2019 0937   LABBARB NONE DETECTED 10/03/2019 0937    Alcohol Level     Component Value Date/Time   ETH <10 10/02/2019 1953    IMAGING past 24 hours DG Chest Port 1 View  Result Date: 10/05/2019 CLINICAL DATA:  Acute respiratory failure with hypoxia. EXAM: PORTABLE CHEST 1 VIEW COMPARISON:  10/03/2019 FINDINGS: The endotracheal tube and NG tubes have been removed. The cardiac silhouette, mediastinal and hilar contours are normal. The lungs are clear. No pleural effusions. The bony thorax is intact. IMPRESSION: No acute cardiopulmonary findings. Electronically Signed   By: Rudie Meyer M.D.   On: 10/05/2019 08:26   VAS [place] LOWER EXTREMITY VENOUS (DVT)  Result Date: 10/04/2019  Lower Venous DVTStudy Indications: Stroke.  Comparison Study: No prior  exam. Performing Technologist: Kennedy Bucker ARDMS, RVT  Examination Guidelines: A complete evaluation includes B-mode imaging, spectral Doppler, color Doppler, and power Doppler as needed of all accessible portions of each vessel. Bilateral testing is considered an integral part of a complete examination. Limited examinations for reoccurring indications may be performed as noted. The reflux portion of the exam is performed with the patient in reverse Trendelenburg.  +---------+---------------+---------+-----------+----------+-------------------+ RIGHT    CompressibilityPhasicitySpontaneityPropertiesThrombus Aging      +---------+---------------+---------+-----------+----------+-------------------+ CFV      Full           Yes      Yes                                      +---------+---------------+---------+-----------+----------+-------------------+ SFJ      Full                                                             +---------+---------------+---------+-----------+----------+-------------------+ FV Prox  Full                                                              +---------+---------------+---------+-----------+----------+-------------------+ FV Mid   Full                                                             +---------+---------------+---------+-----------+----------+-------------------+ FV DistalFull                                                             +---------+---------------+---------+-----------+----------+-------------------+ PFV                                                   not visualized due                                                        to bandage          +---------+---------------+---------+-----------+----------+-------------------+ POP      Full           Yes      Yes                                      +---------+---------------+---------+-----------+----------+-------------------+ PTV      Full                                                             +---------+---------------+---------+-----------+----------+-------------------+  PERO     Full                                                             +---------+---------------+---------+-----------+----------+-------------------+   +---------+---------------+---------+-----------+----------+--------------+ LEFT     CompressibilityPhasicitySpontaneityPropertiesThrombus Aging +---------+---------------+---------+-----------+----------+--------------+ CFV      Full           Yes      Yes                                 +---------+---------------+---------+-----------+----------+--------------+ SFJ      Full                                                        +---------+---------------+---------+-----------+----------+--------------+ FV Prox  Full                                                        +---------+---------------+---------+-----------+----------+--------------+ FV Mid   Full                                                         +---------+---------------+---------+-----------+----------+--------------+ FV DistalFull                                                        +---------+---------------+---------+-----------+----------+--------------+ PFV      Full                                                        +---------+---------------+---------+-----------+----------+--------------+ POP      Full           Yes      Yes                                 +---------+---------------+---------+-----------+----------+--------------+ PTV      Full                                                        +---------+---------------+---------+-----------+----------+--------------+ PERO     Full                                                        +---------+---------------+---------+-----------+----------+--------------+  Summary: BILATERAL: - No evidence of deep vein thrombosis seen in the lower extremities, bilaterally.  RIGHT: - No cystic structure found in the popliteal fossa.  LEFT: - No cystic structure found in the popliteal fossa.  *See table(s) above for measurements and observations. Electronically signed by Curt Jews MD on 10/04/2019 at 2:47:34 PM.    Final    Cerebral Angio S/P Lt common carotid arteriogram followed by revascularization of occluded Lt MCA M1 occlusion with x 2 passes with 94mm x 40 mm solitaire X retriever and x 1 pass with solitaire X 6 mm x 40 mm retriever and penumbra aspiration achieving a TICI 2C revascularization with rescue stenting of reocclusion  With Y stenting  Into the sup and inf divisions achievin a TICI 2C  revascularization, Occlusion of  of sup division stent with slow revascularization following use of 6 mg of IA integrelin.Marland Kitchen   PHYSICAL EXAM   Temp:  [98.2 F (36.8 C)-99.7 F (37.6 C)] 98.2 F (36.8 C) (03/11 0800) Pulse Rate:  [73-98] 73 (03/11 0800) Resp:  [14-28] 21 (03/11 0800) BP: (97-135)/(52-90) 128/64 (03/11 0800) SpO2:  [91 %-100 %] 93 %  (03/11 0800) Arterial Line BP: (135)/(70) 135/70 (03/10 0900) FiO2 (%):  [40 %] 40 % (03/10 1007)  General - Well nourished, well developed, not in acute distress.  Ophthalmologic - fundi not visualized due to noncooperation.  Cardiovascular - Regular rate and rhythm.  Neuro - awake alert, extubated yesterday, tolerating well, eyes open spontaneously, following all simple commands.  PERRL, EOMI, visual field full.  Right mild facial droop.  Tongue protrusion midline. Spontaneous moving LUE, about 4/5. RUE 3/5. LLE at least 3/5 proximal and 4+/5 distally. RLE proximal 3/5 proximal and 3/5 distall DF/PF. DTR 1+ and no babinski. Sensation symmetrical subjectively, coordination intact left finger-to-nose and gait not tested.   ASSESSMENT/PLAN [name] is a 54 y.o. female with history of complex migraines who does not often see a doctor presenting to [facility] ED with R sided hemiparesis and speech deficits. Received tPA 10/03/2019 at 2016 at [facility]. Transferred to [facility] for IR.   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 Chronic lacune L corona radiata. ASPECTS 10.     CTA head & neck ELVO L M1/L MCA bifurcation. Proximal R ICA 50% stenosis. R VA origin 50% stenosis. L VA hypoplastic/occlusion. emphysema  CT perfusion positive penumbra. Mismatch 75cc.  Cerebral angio L M1 occlusion w/ TICI2c revascularization w/ rescue Y stenting into superior and inferior divisions; superior division stent occlusion w/ revascularization using integrelin. Later treated w/ Aggrastat. Sheath out 3/9  MRI  Patchy L MCA infarcts posterior caudate and lentiform. Old L corona radiata infarct  MRA   L MCA stent w/ decreased L M1 flow and poor visualization of branches, possible residual L MCA stenosis. 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  CTA head repeat - left M2 superior branch nonocclusive in-stent thrombusis with minimal distal flow beyond stent, however, collateral flow adequate.   LE Doppler  No DVT  2D Echo EF 60-65%. No source of embolus   TEE pending to look for embolic source. Arranged with [place] for tomorrow.  (I have made patient NPO after midnight tonight).   If TEE negative, a [place] electrophysiologist will  consult and consider placement of an implantable loop recorder to evaluate for atrial fibrillation as etiology of stroke. This has been explained to patient/family by Dr. Roda Shutters and they are agreeable.   P2Y12 3/11 = 39  LDL 152  HgbA1c 5.3  SCDs for VTE prophylaxis  No antithrombotic prior to admission, now on aspirin 81 mg daily and Brilinta (ticagrelor) 90 mg bid. Continue DAPT at d/c.   Therapy recommendations: CIR  Disposition:  pending   Acute Hypoxic Respiratory Failure  Intubated for IR, left intubated following IR  Extubation 3/10 to RA  Tolerating well  CCM signed off  Hypotension Hypertensive Emergency  Home meds:  No hx HTN, not on home meds  BP as high as 217/110 on arrival . Treated with Cleviprex post IR, now off  . Permissive hypertension given left M2 minimal flow beyond stent. . 3/10 BP down to 90-100s w/ confusion. Bolus w/ IVF . Added midodrine 5 mg tid . Albumin q6h x 4 . BP goal 120-160 for now  Hyperlipidemia  Home meds:  No statin  LDL 152, goal < 70  lipitor 80 added  Continue statin at discharge  Dysphagia . Secondary to stroke . NPO x meds crushed in puree w/ full supervision . Speech on board . Did not pass swallow today . Pending barium study tomorrow  Hypoglycemia  Glucose 88  Put on D5 normal saline at 75  CBG every 4 hours  Pending barium study tomorrow  Other Stroke Risk Factors  Family hx stroke (mother and father, both in their 61s)  Hx complex  Migraines  Other Active Problems  Leukocytosis 14.1->11.4->15.0->11.1, afebrile   Acute blood loss anemia 13.8->10.5->9.2  Hypokalemia 3.3- supplemented  Hospital day # 3  This patient is critically ill due to stroke, MCA occlusion, s/p tPA and IR and at significant risk of neurological worsening, death form recurrent stroke, hemorrhagic conversion, cerebral edema, seizure. This patient's care requires constant monitoring of vital signs, hemodynamics, respiratory and cardiac monitoring, review of multiple databases, neurological assessment, discussion with family, other specialists and medical decision making of high complexity. I spent 30 minutes of neurocritical care time in the care of this patient. I had long discussion with daughter at bedside, updated pt current condition, treatment plan and potential prognosis, and answered all the questions. She expressed understanding and appreciation. I also discussed with Dr. Everardo All.   Marvel Plan, MD PhD Stroke Neurology 10/05/2019 8:49 AM     To contact Stroke Continuity provider, please refer to [email or web address]. After hours, contact General Neurology

## 2019-10-06 ENCOUNTER — Inpatient Hospital Stay (HOSPITAL_COMMUNITY): Payer: Medicaid Other

## 2019-10-06 ENCOUNTER — Encounter (HOSPITAL_COMMUNITY)
Admission: EM | Disposition: A | Discharge status: Home / Self Care | Payer: Self-pay | Source: Home / Self Care | Attending: Neurology

## 2019-10-06 ENCOUNTER — Encounter (HOSPITAL_COMMUNITY): Payer: Self-pay | Admitting: Student in an Organized Health Care Education/Training Program

## 2019-10-06 DIAGNOSIS — I959 Hypotension, unspecified: Secondary | ICD-10-CM | POA: Diagnosis not present

## 2019-10-06 DIAGNOSIS — I69391 Dysphagia following cerebral infarction: Secondary | ICD-10-CM

## 2019-10-06 DIAGNOSIS — I6389 Other cerebral infarction: Secondary | ICD-10-CM

## 2019-10-06 DIAGNOSIS — Z823 Family history of stroke: Secondary | ICD-10-CM

## 2019-10-06 DIAGNOSIS — I10 Essential (primary) hypertension: Secondary | ICD-10-CM | POA: Diagnosis present

## 2019-10-06 DIAGNOSIS — E785 Hyperlipidemia, unspecified: Secondary | ICD-10-CM | POA: Diagnosis present

## 2019-10-06 DIAGNOSIS — G43109 Migraine with aura, not intractable, without status migrainosus: Secondary | ICD-10-CM | POA: Diagnosis present

## 2019-10-06 DIAGNOSIS — E162 Hypoglycemia, unspecified: Secondary | ICD-10-CM | POA: Diagnosis not present

## 2019-10-06 DIAGNOSIS — E876 Hypokalemia: Secondary | ICD-10-CM | POA: Diagnosis present

## 2019-10-06 DIAGNOSIS — D62 Acute posthemorrhagic anemia: Secondary | ICD-10-CM | POA: Diagnosis not present

## 2019-10-06 HISTORY — PX: TEE WITHOUT CARDIOVERSION: SHX5443

## 2019-10-06 HISTORY — PX: BUBBLE STUDY: SHX6837

## 2019-10-06 LAB — BASIC METABOLIC PANEL
Anion gap: 12 (ref 5–15)
BUN: 8 mg/dL (ref 6–20)
CO2: 21 mmol/L — ABNORMAL LOW (ref 22–32)
Calcium: 8.9 mg/dL (ref 8.9–10.3)
Chloride: 110 mmol/L (ref 98–111)
Creatinine, Ser: 0.67 mg/dL (ref 0.44–1.00)
GFR calc Af Amer: >60 mL/min (ref >60)
GFR calc non Af Amer: >60 mL/min (ref >60)
Glucose, Bld: 99 mg/dL (ref 70–99)
Potassium: 3.3 mmol/L — ABNORMAL LOW (ref 3.5–5.1)
Sodium: 143 mmol/L (ref 135–145)

## 2019-10-06 LAB — GLUCOSE, CAPILLARY
Glucose-Capillary: 100 mg/dL — ABNORMAL HIGH (ref 70–99)
Glucose-Capillary: 101 mg/dL — ABNORMAL HIGH (ref 70–99)
Glucose-Capillary: 116 mg/dL — ABNORMAL HIGH (ref 70–99)
Glucose-Capillary: 78 mg/dL (ref 70–99)
Glucose-Capillary: 80 mg/dL (ref 70–99)
Glucose-Capillary: 95 mg/dL (ref 70–99)
Glucose-Capillary: 97 mg/dL (ref 70–99)

## 2019-10-06 LAB — CBC
HCT: 30.4 % — ABNORMAL LOW (ref 36.0–46.0)
Hemoglobin: 9.8 g/dL — ABNORMAL LOW (ref 12.0–15.0)
MCH: 30.5 pg (ref 26.0–34.0)
MCHC: 32.2 g/dL (ref 30.0–36.0)
MCV: 94.7 fL (ref 80.0–100.0)
Platelets: 256 10*3/uL (ref 150–400)
RBC: 3.21 MIL/uL — ABNORMAL LOW (ref 3.87–5.11)
RDW: 12.6 % (ref 11.5–15.5)
WBC: 10.4 10*3/uL (ref 4.0–10.5)
nRBC: 0 % (ref 0.0–0.2)

## 2019-10-06 SURGERY — ECHOCARDIOGRAM, TRANSESOPHAGEAL
Anesthesia: Moderate Sedation

## 2019-10-06 MED ORDER — AMANTADINE HCL 100 MG PO CAPS: 100.0000 mg | ORAL_CAPSULE | Freq: Two times a day (BID) | ORAL | Status: DC

## 2019-10-06 MED ORDER — CLOPIDOGREL BISULFATE 75 MG PO TABS
300.0000 mg | ORAL_TABLET | Freq: Once | ORAL | Status: AC
  Administered 2019-10-07: 300 mg via ORAL
  Filled 2019-10-06: qty 4

## 2019-10-06 MED ORDER — FENTANYL CITRATE (PF) 100 MCG/2ML IJ SOLN
INTRAMUSCULAR | Status: AC
  Filled 2019-10-06: qty 4

## 2019-10-06 MED ORDER — CLOPIDOGREL BISULFATE 75 MG PO TABS
75.0000 mg | ORAL_TABLET | Freq: Every day | ORAL | Status: DC
  Filled 2019-10-06: qty 1

## 2019-10-06 MED ORDER — ASPIRIN EC 325 MG PO TBEC
325.0000 mg | DELAYED_RELEASE_TABLET | Freq: Every day | ORAL | Status: DC
  Administered 2019-10-07: 325 mg via ORAL
  Filled 2019-10-06: qty 1

## 2019-10-06 MED ORDER — POTASSIUM CHLORIDE CRYS ER 20 MEQ PO TBCR
20.0000 meq | EXTENDED_RELEASE_TABLET | ORAL | Status: AC
  Administered 2019-10-06 (×2): 20 meq via ORAL
  Filled 2019-10-06 (×2): qty 1

## 2019-10-06 MED ORDER — TICAGRELOR 90 MG PO TABS
90.0000 mg | ORAL_TABLET | Freq: Two times a day (BID) | ORAL | Status: AC
  Administered 2019-10-06: 90 mg via ORAL
  Filled 2019-10-06: qty 1

## 2019-10-06 MED ORDER — SODIUM CHLORIDE BACTERIOSTATIC 0.9 % IJ SOLN
INTRAMUSCULAR | Status: DC | PRN
  Administered 2019-10-06: 9 mL via INTRAVENOUS

## 2019-10-06 MED ORDER — MIDAZOLAM HCL (PF) 10 MG/2ML IJ SOLN
INTRAMUSCULAR | Status: DC | PRN
  Administered 2019-10-06 (×2): 2 mg via INTRAVENOUS

## 2019-10-06 MED ORDER — MIDAZOLAM HCL (PF) 5 MG/ML IJ SOLN
INTRAMUSCULAR | Status: AC
  Filled 2019-10-06: qty 3

## 2019-10-06 MED ORDER — DIPHENHYDRAMINE HCL 50 MG/ML IJ SOLN
INTRAMUSCULAR | Status: AC
  Filled 2019-10-06: qty 1

## 2019-10-06 MED ORDER — BUTAMBEN-TETRACAINE-BENZOCAINE 2-2-14 % EX AERO
INHALATION_SPRAY | CUTANEOUS | Status: DC | PRN
  Administered 2019-10-06: 2 via TOPICAL

## 2019-10-06 MED ORDER — AMANTADINE HCL 100 MG PO CAPS
100.0000 mg | ORAL_CAPSULE | Freq: Every day | ORAL | Status: DC
  Administered 2019-10-06 – 2019-10-07 (×2): 100 mg via ORAL
  Filled 2019-10-06 (×2): qty 1

## 2019-10-06 MED ORDER — FENTANYL CITRATE (PF) 100 MCG/2ML IJ SOLN
INTRAMUSCULAR | Status: DC | PRN
  Administered 2019-10-06 (×2): 25 ug via INTRAVENOUS

## 2019-10-06 NOTE — Progress Notes (Addendum)
STROKE TEAM PROGRESS NOTE   INTERVAL HISTORY Husband and daughter at bedside. Pt back from TEE which was negative. She has no insurance, loop recorder not placed today. Given her no insurance, will switch brilinta to plavix on discharge.    Vitals:   10/05/19 1953 10/05/19 2342 10/06/19 0407 10/06/19 0739  BP: 128/77 (!) 135/54 (!) 148/81 (!) 141/72  Pulse: 70 72 69 77  Resp: 20 20 18 17   Temp: 98 F (36.7 C) 98.6 F (37 C) 98.1 F (36.7 C) (!) 97.5 F (36.4 C)  TempSrc: Oral Oral  Oral  SpO2: 100% 100% 93% 99%  Weight:      Height:        CBC:  Recent Labs  Lab 10/02/19 1953 10/02/19 1953 10/03/19 0212 10/03/19 0231 10/05/19 0819 10/06/19 0431  WBC 14.1*   < > 11.4*   < > 11.1* 10.4  NEUTROABS 10.2*  --  9.4*  --   --   --   HGB 14.7   < > 13.8   < > 9.2* 9.8*  HCT 45.3   < > 40.9   < > 28.3* 30.4*  MCV 94.2   < > 92.1   < > 94.6 94.7  PLT 366   < > 356   < > 243 256   < > = values in this interval not displayed.    Basic Metabolic Panel:  Recent Labs  Lab 10/05/19 0819 10/06/19 0431  NA 141 143  K 3.3* 3.3*  CL 111 110  CO2 21* 21*  GLUCOSE 88 99  BUN 8 8  CREATININE 0.71 0.67  CALCIUM 8.3* 8.9   Lipid Panel:     Component Value Date/Time   CHOL 236 (H) 10/03/2019 0212   TRIG 187 (H) 10/03/2019 0555   HDL 49 10/03/2019 0212   CHOLHDL 4.8 10/03/2019 0212   VLDL 35 10/03/2019 0212   LDLCALC 152 (H) 10/03/2019 0212   HgbA1c:  Lab Results  Component Value Date   HGBA1C 5.3 10/03/2019   Urine Drug Screen:     Component Value Date/Time   LABOPIA NONE DETECTED 10/03/2019 0937   COCAINSCRNUR NONE DETECTED 10/03/2019 0937   LABBENZ NONE DETECTED 10/03/2019 0937   AMPHETMU POSITIVE (A) 10/03/2019 0937   THCU NONE DETECTED 10/03/2019 0937   LABBARB NONE DETECTED 10/03/2019 0937    Alcohol Level     Component Value Date/Time   ETH <10 10/02/2019 1953    IMAGING past 24 hours DG Swallowing Func-Speech Pathology  Result Date:  10/05/2019 Objective Swallowing Evaluation: Type of Study: MBS-Modified Barium Swallow Study  Patient Details Name: Erin Peck MRN: [id number] Date of Birth: 1965-11-19 Today's Date: 10/05/2019 Time: SLP Start Time (ACUTE ONLY): 1323 -SLP Stop Time (ACUTE ONLY): 1334 SLP Time Calculation (min) (ACUTE ONLY): 11 min Past Medical History: No past medical history on file. Past Surgical History: Past Surgical History: Procedure Laterality Date . IR CT HEAD LTD  10/03/2019 . IR INTRA CRAN STENT  10/03/2019 . IR PATIENT EVAL TECH 0-60 MINS  10/03/2019 . IR PERCUTANEOUS ART THROMBECTOMY/INFUSION INTRACRANIAL INC DIAG ANGIO  10/03/2019 . RADIOLOGY WITH ANESTHESIA N/A 10/02/2019  Procedure: IR WITH ANESTHESIA;  Surgeon: Luanne Bras, MD;  Location: [facility];  Service: Radiology;  Laterality: N/A; HPI: 54 year old female with acute Left MCA M1 occlusion s/p tPA and mechanical thrombectomy returning to ICU. Intubated 3/8-3/10.  Subjective: alert, pleasant, not very communicative Assessment / Plan / Recommendation CHL IP CLINICAL IMPRESSIONS 10/05/2019 Clinical  Impression Pt demonstrates a primary oral dysphagia with mild right anterior labial weakness and decreased lingual strength. Pt has mild anterior bolus loss with thin via cup, better containment with a straw. Pt also has mild residue on the lingual body that spills to pharynx post swallow, resulting in some instances of trace spillage to pyrifomr sinuses or even very trace penetration, all sensed and cleared with a second swallow. Pt able to masticate soft solids and again clears residue with a second swallow. Recommend dys 3 mech soft given UE weakness and potential difficulty cutting and preparing foods. Thin liquids. SLP will f/u for tolerance.  SLP Visit Diagnosis Dysphagia, unspecified (R13.10) Attention and concentration deficit following -- Frontal lobe and executive function deficit following -- Impact on safety and function Mild aspiration risk   CHL IP TREATMENT  RECOMMENDATION 10/05/2019 Treatment Recommendations Therapy as outlined in treatment plan below   Prognosis 10/04/2019 Prognosis for Safe Diet Advancement Good Barriers to Reach Goals -- Barriers/Prognosis Comment -- CHL IP DIET RECOMMENDATION 10/05/2019 SLP Diet Recommendations Dysphagia 3 (Mech soft) solids;Thin liquid Liquid Administration via Straw Medication Administration Whole meds with puree Compensations Slow rate;Small sips/bites Postural Changes Remain semi-upright after after feeds/meals (Comment);Seated upright at 90 degrees   CHL IP OTHER RECOMMENDATIONS 10/05/2019 Recommended Consults -- Oral Care Recommendations Oral care BID Other Recommendations --   CHL IP FOLLOW UP RECOMMENDATIONS 10/05/2019 Follow up Recommendations Inpatient Rehab   CHL IP FREQUENCY AND DURATION 10/05/2019 Speech Therapy Frequency (ACUTE ONLY) min 2x/week Treatment Duration --      CHL IP ORAL PHASE 10/05/2019 Oral Phase Impaired Oral - Pudding Teaspoon -- Oral - Pudding Cup -- Oral - Honey Teaspoon -- Oral - Honey Cup -- Oral - Nectar Teaspoon -- Oral - Nectar Cup -- Oral - Nectar Straw -- Oral - Thin Teaspoon -- Oral - Thin Cup Right anterior bolus loss Oral - Thin Straw Weak lingual manipulation;Lingual/palatal residue Oral - Puree Lingual/palatal residue Oral - Mech Soft -- Oral - Regular Lingual/palatal residue Oral - Multi-Consistency -- Oral - Pill Decreased bolus cohesion Oral Phase - Comment --  CHL IP PHARYNGEAL PHASE 10/05/2019 Pharyngeal Phase WFL Pharyngeal- Pudding Teaspoon -- Pharyngeal -- Pharyngeal- Pudding Cup -- Pharyngeal -- Pharyngeal- Honey Teaspoon -- Pharyngeal -- Pharyngeal- Honey Cup -- Pharyngeal -- Pharyngeal- Nectar Teaspoon -- Pharyngeal -- Pharyngeal- Nectar Cup -- Pharyngeal -- Pharyngeal- Nectar Straw -- Pharyngeal -- Pharyngeal- Thin Teaspoon -- Pharyngeal -- Pharyngeal- Thin Cup -- Pharyngeal -- Pharyngeal- Thin Straw -- Pharyngeal -- Pharyngeal- Puree -- Pharyngeal -- Pharyngeal- Mechanical Soft --  Pharyngeal -- Pharyngeal- Regular -- Pharyngeal -- Pharyngeal- Multi-consistency -- Pharyngeal -- Pharyngeal- Pill -- Pharyngeal -- Pharyngeal Comment --  No flowsheet data found. Harlon Ditty, MA CCC-SLP Acute Rehabilitation Services Pager [phone] Office [phone] Claudine Mouton 10/05/2019, 1:42 PM              Cerebral Angio S/P Lt common carotid arteriogram followed by revascularization of occluded Lt MCA M1 occlusion with x 2 passes with 69mm x 40 mm solitaire X retriever and x 1 pass with solitaire X 6 mm x 40 mm retriever and penumbra aspiration achieving a TICI 2C revascularization with rescue stenting of reocclusion  With Y stenting  Into the sup and inf divisions achievin a TICI 2C  revascularization, Occlusion of  of sup division stent with slow revascularization following use of 6 mg of IA integrelin.Marland Kitchen   PHYSICAL EXAM   Temp:  [97.5 F (36.4 C)-98.6 F (37 C)] 97.5 F (36.4  C) (03/12 0739) Pulse Rate:  [64-77] 77 (03/12 0739) Resp:  [17-20] 17 (03/12 0739) BP: (126-148)/(44-114) 141/72 (03/12 0739) SpO2:  [93 %-100 %] 99 % (03/12 0739)  General - Well nourished, well developed, not in acute distress.  Ophthalmologic - fundi not visualized due to noncooperation.  Cardiovascular - Regular rate and rhythm.  Neuro - awake alert, extubated yesterday, tolerating well, eyes open spontaneously, following all simple commands.  PERRL, EOMI, visual field full.  Right mild facial droop.  Tongue protrusion midline. Spontaneous moving LUE, about 4/5. RUE 3/5. BLE 4/5. DTR 1+ and no babinski. Sensation symmetrical subjectively, coordination intact left finger-to-nose but not able to perform FTN on the right and gait not tested.   ASSESSMENT/PLAN Erin Peck is a 54 y.o. female with history of complex migraines who does not often see a doctor presenting to [facility] ED with R sided hemiparesis and speech deficits. Received tPA 10/03/2019 at 2016 at [facility]. Transferred to [facility] for IR.   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 Chronic lacune L corona radiata. ASPECTS 10.     CTA head & neck ELVO L M1/L MCA bifurcation. Proximal R ICA 50% stenosis. R VA origin 50% stenosis. L VA hypoplastic/occlusion. emphysema  CT perfusion positive penumbra. Mismatch 75cc.  Cerebral angio L M1 occlusion w/ TICI2c revascularization w/ rescue Y stenting into superior and inferior divisions; superior division stent occlusion w/ revascularization using integrelin. Later treated w/ Aggrastat. Sheath out 3/9  MRI  Patchy L MCA infarcts posterior caudate and lentiform. Old L corona radiata infarct  MRA   L MCA stent w/ decreased L M1 flow and poor visualization of branches, possible residual L MCA stenosis. 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  CTA head repeat - left M2 superior branch nonocclusive in-stent thrombusis with minimal distal flow beyond stent, however, collateral flow adequate.   LE Doppler  No DVT  2D Echo EF 60-65%. No source of embolus   TEE unremarkable, no PFO  EP cardiology consulted. Will consider placement of an implantable loop recorder as an OP once has insurance coverage  P2Y12 3/11 = 39  LDL 152  HgbA1c 5.3  SCDs for VTE prophylaxis  No antithrombotic prior to admission, now on aspirin 81 mg daily and Brilinta (ticagrelor) 90 mg bid. Given no insurance, will switch to ASA 325 and plavix on d/c.   Therapy recommendations: CIR - pt refused - will go home with OP therapy, DME ordered  Disposition: home  For d/c home 3/13  Hypotension Hypertensive Emergency  Home meds:  No hx HTN, not on home meds  BP as high as 217/110 on arrival . Treated with Cleviprex post IR, now off  . Permissive hypertension given left M2 minimal flow beyond stent. . 3/10 BP  down to 90-100s w/ confusion. Bolus w/ IVF . Added midodrine 5 mg tid . Albumin q6h x 4 . BP now 140s, stable . Long term BP goal 120-150  Hyperlipidemia  Home meds:  No statin  LDL 152, goal < 70  lipitor 80 added  Continue statin at discharge  Dysphagia . Secondary to stroke . Speech on board  MBS cleared for D3 thin liquid diet  Other Stroke Risk Factors  Family hx stroke (mother and father, both in their 58s)  Hx complex  Migraines  Other Active Problems  Leukocytosis 14.1->11.4->15.0->11.1->10.4, afebrile - resolved  Acute blood loss anemia 13.8->10.5->9.2->9.8  Hypokalemia 3.3->3.3 - supplemented  Amantadine 100mg  daily as recommended by PMR for aphasia recovery.  Hospital day # 4  I had long discussion with husband and daughter at bedside, updated pt current condition, treatment plan and potential prognosis, and answered all the questions.  They expressed understanding and appreciation.    , MD PhD Stroke Neurology 10/06/2019 10:04 AM     To contact Stroke Continuity provider, please refer to 12/06/2019. After hours, contact General Neurology

## 2019-10-06 NOTE — Consult Note (Addendum)
ELECTROPHYSIOLOGY CONSULT NOTE  Patient ID: Erin Peck MRN: [id number], DOB/AGE: February 07, 1966   Admit date: 10/02/2019 Date of Consult: 10/06/2019  Primary Physician: Patient, No Pcp Per Primary Cardiologist: new to [facility] Reason for Consultation: Cryptogenic stroke; recommendations regarding Implantable Loop Recorder  History of Present Illness EP has been asked to evaluate Erin Peck for placement of an implantable loop recorder to monitor for atrial fibrillation by Dr Roda Shutters.  The patient was admitted on 10/02/2019 with right sided hemiparesis and speech deficits.  Imaging demonstrated patchy L MCA infarcts due to left M1 occlusion s/p tpa and revascularization with L MCA stent.  she has undergone workup for stroke including echocardiogram and carotid imaging.  The patient has been monitored on telemetry which has demonstrated sinus rhythm with no arrhythmias.  Inpatient stroke work-up is to be completed with a TEE.   Echocardiogram this admission demonstrated normal LVEF and LA size.  Lab work is reviewed.  Prior to admission, the patient denies chest pain, shortness of breath, dizziness, palpitations, or syncope.  They are recovering from their stroke with plans to go to CIR at discharge.   Past Medical History:  Diagnosis Date  . Migraines   . Stroke (cerebrum) [facility])      Surgical History:  Past Surgical History:  Procedure Laterality Date  . IR CT HEAD LTD  10/03/2019  . IR INTRA CRAN STENT  10/03/2019  . IR PATIENT EVAL TECH 0-60 MINS  10/03/2019  . IR PERCUTANEOUS ART THROMBECTOMY/INFUSION INTRACRANIAL INC DIAG ANGIO  10/03/2019  . RADIOLOGY WITH ANESTHESIA N/A 10/02/2019   Procedure: IR WITH ANESTHESIA;  Surgeon: Julieanne Cotton, MD;  Location: [facility] OR;  Service: Radiology;  Laterality: N/A;     Medications Prior to Admission  Medication Sig Dispense Refill Last Dose  . ibuprofen (ADVIL) 200 MG tablet Take 200 mg by mouth every 6 (six) hours as needed for mild pain or  moderate pain.   10/02/2019 at Unknown time    Inpatient Medications:  . aspirin  81 mg Oral Daily  . atorvastatin  80 mg Oral q1800  . Chlorhexidine Gluconate Cloth  6 each Topical Daily  . enoxaparin (LOVENOX) injection  40 mg Subcutaneous Q24H  . mouth rinse  15 mL Mouth Rinse BID  . midodrine  5 mg Oral TID WC  . pantoprazole sodium  40 mg Oral Q24H  . ticagrelor  90 mg Oral BID   Or  . ticagrelor  90 mg Per Tube BID    Allergies: No Known Allergies  Social History   Socioeconomic History  . Marital status: Married    Spouse name: Not on file  . Number of children: Not on file  . Years of education: Not on file  . Highest education level: Not on file  Occupational History  . Not on file  Tobacco Use  . Smoking status: Never Smoker  . Smokeless tobacco: Never Used  Substance and Sexual Activity  . Alcohol use: Not Currently  . Drug use: Not Currently  . Sexual activity: Not on file  Other Topics Concern  . Not on file  Social History Narrative  . Not on file   Social Determinants of Health   Financial Resource Strain:   . Difficulty of Paying Living Expenses:   Food Insecurity:   . Worried About Programme researcher, broadcasting/film/video in the Last Year:   . [employer] of Food in the Last Year:   Transportation Needs:   . Lack of  Transportation (Medical):   Marland Kitchen Lack of Transportation (Non-Medical):   Physical Activity:   . Days of Exercise per Week:   . Minutes of Exercise per Session:   Stress:   . Feeling of Stress :   Social Connections:   . Frequency of Communication with Friends and Family:   . Frequency of Social Gatherings with Friends and Family:   . Attends Religious Services:   . Active Member of Clubs or Organizations:   . Attends Archivist Meetings:   Marland Kitchen Marital Status:   Intimate Partner Violence:   . Fear of Current or Ex-Partner:   . Emotionally Abused:   Marland Kitchen Physically Abused:   . Sexually Abused:      Family History: no premature CAD    Review of  Systems: All other systems reviewed and are otherwise negative except as noted above.  Physical Exam: Vitals:   10/05/19 1953 10/05/19 2342 10/06/19 0407 10/06/19 0739  BP: 128/77 (!) 135/54 (!) 148/81 (!) 141/72  Pulse: 70 72 69 77  Resp: 20 20 18 17   Temp: 98 F (36.7 C) 98.6 F (37 C) 98.1 F (36.7 C) (!) 97.5 F (36.4 C)  TempSrc: Oral Oral  Oral  SpO2: 100% 100% 93% 99%  Weight:      Height:        GEN- The patient is well appearing, alert and oriented x 3 today.   Head- normocephalic, atraumatic Eyes-  Sclera clear, conjunctiva pink Ears- hearing intact Oropharynx- clear Neck- supple Lungs- Clear to ausculation bilaterally, normal work of breathing Heart- Regular rate and rhythm  GI- soft, NT, ND, + BS Extremities- no clubbing, cyanosis, or edema MS- no significant deformity or atrophy Skin- no rash or lesion Psych- euthymic mood, full affect   Labs:   Lab Results  Component Value Date   WBC 10.4 10/06/2019   HGB 9.8 (L) 10/06/2019   HCT 30.4 (L) 10/06/2019   MCV 94.7 10/06/2019   PLT 256 10/06/2019    Recent Labs  Lab 10/02/19 1953 10/03/19 0212 10/06/19 0431  NA 140   < > 143  K 4.3   < > 3.3*  CL 107   < > 110  CO2 25   < > 21*  BUN 13   < > 8  CREATININE 0.88   < > 0.67  CALCIUM 9.2   < > 8.9  PROT 7.4  --   --   BILITOT 0.4  --   --   ALKPHOS 74  --   --   ALT 19  --   --   AST 17  --   --   GLUCOSE 113*   < > 99   < > = values in this interval not displayed.     Radiology/Studies: CT ANGIO HEAD W OR WO CONTRAST  Result Date: 10/04/2019 CLINICAL DATA:  Stroke follow-up EXAM: CT ANGIOGRAPHY HEAD TECHNIQUE: Multidetector CT imaging of the head was performed using the standard protocol during bolus administration of intravenous contrast. Multiplanar CT image reconstructions and MIPs were obtained to evaluate the vascular anatomy. CONTRAST:  40mL OMNIPAQUE IOHEXOL 350 MG/ML SOLN COMPARISON:  Brain MRI from yesterday FINDINGS: CT HEAD Brain:  Cytotoxic edema from infarct in the left basal ganglia and adjacent white matter, unchanged in extent from prior brain MRI. There is also pre-existing lacunar infarct in the left frontal white matter. No hemorrhagic conversion or midline shift. Vascular: See below Skull: Negative Sinuses: Patchy chronic appearing sinusitis Orbits: Negative  CTA HEAD Anterior circulation: The siphons are symmetrically patent. Y stent at the left M1 to M2 segments with nonocclusive thrombus seen in the lumen of the upper limb. The lower limb is well enhancing. Limited visualization at the margins of the stent due to artifact from radiopaque markers. Hypoplastic right A1 segment. Negative for aneurysm or right-sided embolic disease Posterior circulation: Right dominant vertebral artery. Basilar is smooth and widely patent. No branch occlusion or beading. Negative for aneurysm. Venous sinuses: Diffusely patent Anatomic variants: None significant Call has been placed to the Neurology team. IMPRESSION: 1. Y stenting of the left M1 and M2 segments with nonocclusive in stent thrombus narrowing the upper limb. 2. Acute left MCA territory infarct is unchanged from brain MRI yesterday. Electronically Signed   By: Marnee Spring M.D.   On: 10/04/2019 08:21   CT ANGIO HEAD W OR WO CONTRAST  Result Date: 10/02/2019 CLINICAL DATA:  Initial evaluation for acute headache, right-sided weakness. EXAM: CT ANGIOGRAPHY HEAD AND NECK CT PERFUSION BRAIN TECHNIQUE: Multidetector CT imaging of the head and neck was performed using the standard protocol during bolus administration of intravenous contrast. Multiplanar CT image reconstructions and MIPs were obtained to evaluate the vascular anatomy. Carotid stenosis measurements (when applicable) are obtained utilizing NASCET criteria, using the distal internal carotid diameter as the denominator. Multiphase CT imaging of the brain was performed following IV bolus contrast injection. Subsequent parametric  perfusion maps were calculated using RAPID software. CONTRAST:  OMNIPAQUE IOHEXOL 350 MG/ML SOLN COMPARISON:  Prior head CT from earlier same day. FINDINGS: CTA NECK FINDINGS Aortic arch: Visualized aortic arch normal in caliber. Bovine arch with common origin of the right brachiocephalic and left common carotid artery noted. Mild-to-moderate atheromatous plaque about the arch and origin of the great vessels without hemodynamically significant stenosis. Visualized subclavian arteries widely patent. Right carotid system: Right common carotid artery patent from its origin to the bifurcation without stenosis. Scattered eccentric calcified plaque about the right bifurcation/proximal right ICA with stenosis of up to approximately 50% by NASCET criteria. Right ICA otherwise widely patent distally to the skull base without stenosis, dissection, or occlusion. Left carotid system: Calcified plaque at the origin of the left CCA without high-grade stenosis. Left CCA otherwise patent to the bifurcation without stenosis. Mild scattered calcified plaque about the left bifurcation/proximal left ICA without significant stenosis. Left ICA widely patent distally to the skull base without stenosis, dissection or occlusion. Vertebral arteries: Both vertebral arteries arise from the subclavian arteries. Right vertebral artery dominant with a diffusely hypoplastic left vertebral artery. Focal atheromatous plaque at the origin of the right vertebral artery with approximate 50% stenosis. Right vertebral artery otherwise widely patent within the neck. 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 Distal reconstitution via muscular branches at the left V3 segment (series 7, image 158). Left vertebral is patent as it courses into the cranial vault. Skeleton: No acute osseous abnormality. No discrete or worrisome osseous lesions. Other neck: No other acute soft tissue abnormality within the neck. No adenopathy. Subcentimeter  hypodense nodule noted within the right thyroid lobe, of doubtful significance given size and patient age. No follow-up imaging recommended regarding this lesion. Upper chest: Visualized upper chest demonstrates no acute finding. Partially visualized lungs are clear. Paraseptal and centrilobular emphysematous changes noted at the lung apices. Review of the MIP images confirms the above findings CTA HEAD FINDINGS Anterior circulation: Petrous segments widely patent bilaterally. Cavernous and supraclinoid ICAs patent without flow-limiting stenosis. A1 segments patent bilaterally. Right  A1 hypoplastic, accounting for the slightly diminutive right ICA is compared to the left. Normal anterior communicating artery. Anterior cerebral arteries widely patent to their distal aspects. Right M1 widely patent. Normal right MCA bifurcation. Distal right MCA branches well perfused. Left M1 patent proximally. There is occlusive thrombus within the distal left M1 segment/left MCA bifurcation (series 7, image 94). Fairly good perfusion seen within the left MCA branches distally, which could be related to subocclusive thrombus and/or collateralization. Posterior circulation: Vertebral arteries patent to the vertebrobasilar junction without stenosis. Neither PICA of visualized. Basilar widely patent to its distal aspect without stenosis. Superior cerebral arteries patent bilaterally. Both PCAs primarily supplied via the basilar and are well perfused or distal aspects. Small hypoplastic posterior communicating arteries noted bilaterally. Venous sinuses: Patent. Anatomic variants: None significant.  No aneurysm. Review of the MIP images confirms the above findings CT Brain Perfusion Findings: ASPECTS: 10 CBF (<30%) Volume: 2mL Perfusion (Tmax>6.0s) volume: 69mL Mismatch Volume: 73mL Infarction Location:Patchy acute core infarct seen involving the cortical and subcortical aspect of the mid and posterior left frontal region. Moderate  sized surrounding ischemic penumbra. IMPRESSION: CTA HEAD AND NECK IMPRESSION: 1. Positive CTA for emergent large vessel occlusion, with occlusive thrombus involving the distal left M1 segment/left MCA bifurcation. Fairly robust perfusion within the distal left MCA branches distally, could be related to subocclusive thrombus and/or collateralization. 2. 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 3. 50% stenosis at the origin of the dominant right vertebral artery. 4. Hypoplastic left vertebral artery, occluded within the neck, with distal reconstitution by the skull base. 5.  Emphysema (ICD10-J43.9). CT PERFUSION IMPRESSION: 19 cc acute core infarct involving the mid and posterior left frontal region, left MCA distribution. 94 cc surrounding ischemic penumbra, mismatch volume 75 cc. Critical Value/emergent results were called by telephone at the time of interpretation on 10/02/2019 at 9:05 pm to provider Dr. Deretha Emory, who verbally acknowledged these results. Electronically Signed   By: Rise Mu M.D.   On: 10/02/2019 21:31   CT ANGIO NECK W OR WO CONTRAST  Result Date: 10/02/2019 CLINICAL DATA:  Initial evaluation for acute headache, right-sided weakness. EXAM: CT ANGIOGRAPHY HEAD AND NECK CT PERFUSION BRAIN TECHNIQUE: Multidetector CT imaging of the head and neck was performed using the standard protocol during bolus administration of intravenous contrast. Multiplanar CT image reconstructions and MIPs were obtained to evaluate the vascular anatomy. Carotid stenosis measurements (when applicable) are obtained utilizing NASCET criteria, using the distal internal carotid diameter as the denominator. Multiphase CT imaging of the brain was performed following IV bolus contrast injection. Subsequent parametric perfusion maps were calculated using RAPID software. CONTRAST:  OMNIPAQUE IOHEXOL 350 MG/ML SOLN COMPARISON:  Prior head CT from earlier same day. FINDINGS: CTA NECK FINDINGS  Aortic arch: Visualized aortic arch normal in caliber. Bovine arch with common origin of the right brachiocephalic and left common carotid artery noted. Mild-to-moderate atheromatous plaque about the arch and origin of the great vessels without hemodynamically significant stenosis. Visualized subclavian arteries widely patent. Right carotid system: Right common carotid artery patent from its origin to the bifurcation without stenosis. Scattered eccentric calcified plaque about the right bifurcation/proximal right ICA with stenosis of up to approximately 50% by NASCET criteria. Right ICA otherwise widely patent distally to the skull base without stenosis, dissection, or occlusion. Left carotid system: Calcified plaque at the origin of the left CCA without high-grade stenosis. Left CCA otherwise patent to the bifurcation without stenosis. Mild scattered calcified plaque about the left bifurcation/proximal  left ICA without significant stenosis. Left ICA widely patent distally to the skull base without stenosis, dissection or occlusion. Vertebral arteries: Both vertebral arteries arise from the subclavian arteries. Right vertebral artery dominant with a diffusely hypoplastic left vertebral artery. Focal atheromatous plaque at the origin of the right vertebral artery with approximate 50% stenosis. Right vertebral artery otherwise widely patent within the neck. 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 Distal reconstitution via muscular branches at the left V3 segment (series 7, image 158). Left vertebral is patent as it courses into the cranial vault. Skeleton: No acute osseous abnormality. No discrete or worrisome osseous lesions. Other neck: No other acute soft tissue abnormality within the neck. No adenopathy. Subcentimeter hypodense nodule noted within the right thyroid lobe, of doubtful significance given size and patient age. No follow-up imaging recommended regarding this lesion. Upper chest:  Visualized upper chest demonstrates no acute finding. Partially visualized lungs are clear. Paraseptal and centrilobular emphysematous changes noted at the lung apices. Review of the MIP images confirms the above findings CTA HEAD FINDINGS Anterior circulation: Petrous segments widely patent bilaterally. Cavernous and supraclinoid ICAs patent without flow-limiting stenosis. A1 segments patent bilaterally. Right A1 hypoplastic, accounting for the slightly diminutive right ICA is compared to the left. Normal anterior communicating artery. Anterior cerebral arteries widely patent to their distal aspects. Right M1 widely patent. Normal right MCA bifurcation. Distal right MCA branches well perfused. Left M1 patent proximally. There is occlusive thrombus within the distal left M1 segment/left MCA bifurcation (series 7, image 94). Fairly good perfusion seen within the left MCA branches distally, which could be related to subocclusive thrombus and/or collateralization. Posterior circulation: Vertebral arteries patent to the vertebrobasilar junction without stenosis. Neither PICA of visualized. Basilar widely patent to its distal aspect without stenosis. Superior cerebral arteries patent bilaterally. Both PCAs primarily supplied via the basilar and are well perfused or distal aspects. Small hypoplastic posterior communicating arteries noted bilaterally. Venous sinuses: Patent. Anatomic variants: None significant.  No aneurysm. Review of the MIP images confirms the above findings CT Brain Perfusion Findings: ASPECTS: 10 CBF (<30%) Volume: 19mL Perfusion (Tmax>6.0s) volume: 94mL Mismatch Volume: 75mL Infarction Location:Patchy acute core infarct seen involving the cortical and subcortical aspect of the mid and posterior left frontal region. Moderate sized surrounding ischemic penumbra. IMPRESSION: CTA HEAD AND NECK IMPRESSION: 1. Positive CTA for emergent large vessel occlusion, with occlusive thrombus involving the distal  left M1 segment/left MCA bifurcation. Fairly robust perfusion within the distal left MCA branches distally, could be related to subocclusive thrombus and/or collateralization. 2. 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 3. 50% stenosis at the origin of the dominant right vertebral artery. 4. Hypoplastic left vertebral artery, occluded within the neck, with distal reconstitution by the skull base. 5.  Emphysema (ICD10-J43.9). CT PERFUSION IMPRESSION: 19 cc acute core infarct involving the mid and posterior left frontal region, left MCA distribution. 94 cc surrounding ischemic penumbra, mismatch volume 75 cc. Critical Value/emergent results were called by telephone at the time of interpretation on 10/02/2019 at 9:05 pm to provider Dr. Deretha Emory, who verbally acknowledged these results. Electronically Signed   By: Rise Mu M.D.   On: 10/02/2019 21:31   MR ANGIO HEAD WO CONTRAST  Addendum Date: 10/03/2019   ADDENDUM REPORT: 10/03/2019 23:31 ADDENDUM: Left MCA findings discussed by telephone with Dr. Milon Dikes on 10/03/2019 at 2320 hours. And we discussed that a repeat CTA may be valuable to further quantify the Left MCA reperfusion. Electronically Signed  By: Odessa Fleming M.D.   On: 10/03/2019 23:31   Result Date: 10/03/2019 CLINICAL DATA:  54 year old female status post emergent large vessel occlusion, left M1 thrombus treated with endovascular reperfusion with rescue stenting. EXAM: MRI HEAD WITHOUT CONTRAST MRA HEAD WITHOUT CONTRAST TECHNIQUE: Multiplanar, multiecho pulse sequences of the brain and surrounding structures were obtained without intravenous contrast. Angiographic images of the head were obtained using MRA technique without contrast. COMPARISON:  CTA head and neck, CT Perfusion yesterday. FINDINGS: MRI HEAD FINDINGS Brain: Left MCA territory restricted diffusion, most confluent at the posterior left caudate and left lentiform, with additional scattered small cortical and  subcortical areas around the left insula and left inferior frontal gyrus. This is similar to although not exactly corresponding to the predicted core infarct on CTP (19 mL on that exam). Associated T2 and FLAIR hyperintensity with no convincing hemorrhagic transformation. No mass effect. No contralateral right hemisphere or posterior fossa restricted diffusion. Scattered bilateral small and mostly subcortical white matter T2 and FLAIR hyperintense foci. 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 No midline shift, mass effect, evidence of mass lesion, ventriculomegaly, extra-axial collection or acute intracranial hemorrhage. Cervicomedullary junction and pituitary are within normal limits. Vascular: Major intracranial vascular flow voids are preserved. Skull and upper cervical spine: Negative visible cervical spine. Visualized bone marrow signal is within normal limits. Sinuses/Orbits: Negative orbits. Posterior ethmoid and sphenoid sinus mucosal thickening. Other: Layering fluid in the pharynx. Mastoids remain clear. The patient is intubated with an oral enteric tube also partially visible. MRA HEAD FINDINGS Antegrade flow in the posterior circulation with dominant right vertebral artery. Patent left vertebrobasilar junction. Normal right PICA origin. Patent basilar artery. Patent AICA, SCA and PCA origins. Both posterior communicating arteries are present. Bilateral PCA branches are within normal limits. Antegrade flow in both ICA siphons. No siphon stenosis identified. Ophthalmic and posterior communicating artery origins appear normal. Patent carotid termini. Patent MCA and ACA origins. The left ACA appears dominant. And there is a 2 mm infundibulum versus aneurysm directed superiorly from the anterior communicating artery region on series 2, image 93. But otherwise the visible ACA branches are within normal limits. Right MCA M1  segment, right MCA bifurcation and visible right MCA branches are within normal limits. The left M1 was stented. The left MCA origin is patent, with loss of most of the left MCA flow signal, although there is flow signal detected at the left MCA bifurcation. However, there is a paucity of left MCA branches when compared to the right side. Furthermore, there is asymmetric FLAIR signal within left MCA M2 and M3 branches on the anatomic images above. IMPRESSION: 1. The Left MCA was stented which is at least partially responsible for the decreased left M1 flow signal. However, decreased visualization of left MCA branches on MRA and increased branch FLAIR signal raises the possibility of residual hemodynamically significant MCA stenosis. 2. MRA is negative for other intracranial stenosis. 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 3. MRI reveals patchy left MCA infarcts most confluent at the posterior caudate and lentiform. Overall infarct volume probably similar to that predicted on CTP yesterday. 4. No intracranial mass effect or hemorrhagic transformation. 5. Chronic left corona radiata infarct. Electronically Signed: By: Odessa Fleming M.D. On: 10/03/2019 23:18   MR BRAIN WO CONTRAST  Addendum Date: 10/03/2019   ADDENDUM REPORT: 10/03/2019 23:31 ADDENDUM: Left MCA findings discussed by telephone with  Dr. Milon Dikes on 10/03/2019 at 2320 hours. And we discussed that a repeat CTA may be valuable to further quantify the Left MCA reperfusion. Electronically Signed   By: Odessa Fleming M.D.   On: 10/03/2019 23:31   Result Date: 10/03/2019 CLINICAL DATA:  55 year old female status post emergent large vessel occlusion, left M1 thrombus treated with endovascular reperfusion with rescue stenting. EXAM: MRI HEAD WITHOUT CONTRAST MRA HEAD WITHOUT CONTRAST TECHNIQUE: Multiplanar, multiecho pulse sequences of the brain and surrounding structures were obtained without intravenous contrast.  Angiographic images of the head were obtained using MRA technique without contrast. COMPARISON:  CTA head and neck, CT Perfusion yesterday. FINDINGS: MRI HEAD FINDINGS Brain: Left MCA territory restricted diffusion, most confluent at the posterior left caudate and left lentiform, with additional scattered small cortical and subcortical areas around the left insula and left inferior frontal gyrus. This is similar to although not exactly corresponding to the predicted core infarct on CTP (19 mL on that exam). Associated T2 and FLAIR hyperintensity with no convincing hemorrhagic transformation. No mass effect. No contralateral right hemisphere or posterior fossa restricted diffusion. Scattered bilateral small and mostly subcortical white matter T2 and FLAIR hyperintense foci. 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 No midline shift, mass effect, evidence of mass lesion, ventriculomegaly, extra-axial collection or acute intracranial hemorrhage. Cervicomedullary junction and pituitary are within normal limits. Vascular: Major intracranial vascular flow voids are preserved. Skull and upper cervical spine: Negative visible cervical spine. Visualized bone marrow signal is within normal limits. Sinuses/Orbits: Negative orbits. Posterior ethmoid and sphenoid sinus mucosal thickening. Other: Layering fluid in the pharynx. Mastoids remain clear. The patient is intubated with an oral enteric tube also partially visible. MRA HEAD FINDINGS Antegrade flow in the posterior circulation with dominant right vertebral artery. Patent left vertebrobasilar junction. Normal right PICA origin. Patent basilar artery. Patent AICA, SCA and PCA origins. Both posterior communicating arteries are present. Bilateral PCA branches are within normal limits. Antegrade flow in both ICA siphons. No siphon stenosis identified. Ophthalmic and posterior communicating artery  origins appear normal. Patent carotid termini. Patent MCA and ACA origins. The left ACA appears dominant. And there is a 2 mm infundibulum versus aneurysm directed superiorly from the anterior communicating artery region on series 2, image 93. But otherwise the visible ACA branches are within normal limits. Right MCA M1 segment, right MCA bifurcation and visible right MCA branches are within normal limits. The left M1 was stented. The left MCA origin is patent, with loss of most of the left MCA flow signal, although there is flow signal detected at the left MCA bifurcation. However, there is a paucity of left MCA branches when compared to the right side. Furthermore, there is asymmetric FLAIR signal within left MCA M2 and M3 branches on the anatomic images above. IMPRESSION: 1. The Left MCA was stented which is at least partially responsible for the decreased left M1 flow signal. However, decreased visualization of left MCA branches on MRA and increased branch FLAIR signal raises the possibility of residual hemodynamically significant MCA stenosis. 2. MRA is negative for other intracranial stenosis. 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 3. MRI reveals patchy left MCA infarcts most confluent at the posterior caudate and lentiform. Overall infarct volume probably similar to that predicted on CTP yesterday. 4. No intracranial mass effect or hemorrhagic transformation. 5. Chronic left corona radiata infarct. Electronically Signed: By: Rexene Edison  Margo AyeHall M.D. On: 10/03/2019 23:18   IR Intra Cran Stent  Result Date: 10/04/2019 INDICATION: New onset aphasia, right-sided paralysis, and left gaze deviation. 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 EXAM: 1. EMERGENT LARGE VESSEL OCCLUSION THROMBOLYSIS (anterior CIRCULATION) 2. Intracranial rescue stent placement COMPARISON:  CT angiogram of the head and neck of October 03, 2019. MEDICATIONS:  Ancef 2 g IV antibiotic was administered within 1 hour of the procedure. ANESTHESIA/SEDATION: General anesthesia. CONTRAST:  Isovue 300 approximately 120 mL. FLUOROSCOPY TIME:  Fluoroscopy Time: 76 minutes 36 seconds (3040 mGy). COMPLICATIONS: None immediate. TECHNIQUE: Following a full explanation of the procedure along with the potential associated complications, an informed witnessed consent was obtained patient's spouse. The risks of intracranial hemorrhage of 10%, worsening neurological deficit, ventilator dependency, death and inability to revascularize were all reviewed in detail with the patient's spouse. The patient was then put under general anesthesia by the [facility]. The right groin was prepped and draped in the usual sterile fashion. Thereafter using modified Seldinger technique, transfemoral access into the right common femoral artery was obtained without difficulty. Over a 0.035 inch guidewire an 8 French 25 cm Pinnacle sheath was inserted. Through this, and also over a 0.035 inch guidewire a connection of a Berenstein 125 cm catheter inside of an 087 95 cm balloon guide catheter was advanced to the left common carotid artery. The guidewire, and the Berenstein support catheter were then removed. Good aspiration obtained from the hub of the balloon guide catheter. A gentle control arteriogram performed through balloon guide catheter demonstrated no evidence of spasms, dissections or of intraluminal filling defects. FINDINGS: However, the left common carotid arteriogram demonstrates the left external carotid artery and its major branches to be widely patent. The left internal carotid artery at the bulb to the cranial skull base also demonstrates wide patency. The petrous, the cavernous and the supraclinoid segments are intact. The left anterior cerebral artery opacifies into the capillary and venous phases with prompt cross-filling of the right anterior  cerebral A2 segment from the left internal carotid artery injection. The left middle cerebral artery demonstrates complete occlusion in its mid M1 segment. The delayed arterial phase demonstrates partial retrograde opacification of the anterior to middle perisylvian branches. PROCEDURE: Over a 0.014 inch standard Synchro micro guidewire with a J configuration the combination of a Trevo ProVue 021 microcatheter inside of a 6 French 132 cm Catalyst guide catheter was advanced using biplane roadmap technique and constant fluoroscopic guidance to the supraclinoid left ICA. The left middle cerebral artery was then entered with the micro guidewire and into the superior division of the left middle cerebral artery followed by the microcatheter. The guidewire was removed. Good aspiration obtained from the hub of the microcatheter. A gentle control arteriogram performed through this demonstrated safe position of the tip of the microcatheter which was then connected to continuous heparinized saline infusion. The 6 [place] Catalyst guide catheter was then advanced into the distal left middle cerebral artery with near complete occlusion of the aspiration with a Penumbra aspiration device for approximately 2-1/2 minutes. A 4 mm x 40 mm Solitaire X retrieval device was then advanced to the distal end of the microcatheter and deployed with slight forward gentle traction with the right hand on the delivery micro guidewire, and retrieving the delivery microcatheter. Proximal flow arrest was then established by inflating the balloon of the balloon guide catheter in the mid left internal carotid  artery. With constant aspiration applied at the hub of the 6 [place] Catalyst guide catheter, and with a 60 mL syringe at the hub of the balloon guide catheter, the combination of the retrieval device, and the microcatheter and the 6 [place] Catalyst catheter were retrieved and removed. Flow arrest was then reversed. A control arteriogram  performed through the balloon guide catheter in the left internal carotid artery continued to demonstrate occluded left middle cerebral M1 segment. No evidence of clot was seen in the aspirate or entangled in the retrieval device. A second pass was then made again using the above combination. At this time access with the micro guidewire and the microcatheter was obtained in the inferior division of the left middle cerebral artery M2 M3 region where the microcatheter was positioned. The guidewire was removed. Good aspiration obtained from the hub of the microcatheter. A gentle control arteriogram performed through the microcatheter demonstrates safe position of the tip of the microcatheter. A 6 mm x 40 mm Solitaire X retrieval device was then advanced to the distal end of the microcatheter. The O ring on the delivery microcatheter was loosened. With slight forward gentle traction with the right hand on the delivery micro guidewire, with the left hand the delivery microcatheter was retrieved deploying the retrieval device. The 6 [place] Catalyst guide was now engaged again in the occluded left middle cerebral artery distally to near complete occlusion to aspiration with the Penumbra aspiration device over 2-1/2 minutes. With proximal flow arrest and constant aspiration being applied at the hub of the balloon guide catheter in the left internal carotid artery, the combination was retrieved and removed. Again no evidence of a clot was seen in the aspirate or in the retrieval device. A control arteriogram performed through the balloon guide catheter in the left internal carotid artery again demonstrated no change in the occluded left middle cerebral artery M1 segment. A third pass was then made using a large-bore catheter inside of which was the 021 Trevo microcatheter again advanced as the combination over a 0.14 inch standard Synchro micro guidewire to the supraclinoid left ICA. Again the micro guidewire was then gently  manipulated with a torque device and this time advanced into the middle branch of the left MCA trifurcation between the inferior and the superior divisions. This is then followed by the microcatheter into the M2 M3 region. The guidewire was removed. Good aspiration obtained from the hub of the microcatheter. A gentle control arteriogram performed through the microcatheter demonstrated safe position of the tip of the microcatheter which was then connected to continuous heparinized saline infusion. A 4 mm x 40 mm Solitaire X retrieval device was again advanced to the distal end of the microcatheter. This was again deployed in the usual manner. A gentle control arteriogram performed through the 6 French large-bore catheter in the proximal left MCA now demonstrated a TICI 2c revascularization with an overlying severe atherosclerotic stenosis at the origin of the middle branch and extending into the origin of the inferior division. Again with proximal flow arrest in the left internal carotid artery, and constant aspiration with the Penumbra aspirate device with the large-bore catheter embedded in the occluded distal left MCA over 2-1/2 minutes the combination was retrieved and removed. Again no evidence of clot was found in the retrieval device, or aspirate. A control arteriogram performed following flow reversal in the left internal carotid artery demonstrated angiographically occluded left middle cerebral artery. It was felt overlying pathology was related to an underlying atherosclerotic  plaque causing significant stenosis. Following discussion with the referring neuro hospitalist, it was decided to proceed with rescue stenting. At this time, the patient was loaded with Brilinta 180 mg, and aspirin 81 mg via an orogastric tube. A combination of a Headway 17 2 tip microcatheter inside of a 014 inch standard Synchro micro guidewire inside of a large-bore support catheter was then advanced as mentioned earlier to the  supraclinoid left ICA. The micro guidewire was then gently manipulated with a torque device and advanced without difficulty into the inferior division of the left MCA trifurcation followed by the microcatheter. The guidewire was removed. Good aspiration obtained from the hub of the Headway microcatheter. A gentle control arteriogram performed through microcatheter demonstrated safe position of the tip of the microcatheter. A 4 mm x 24 mm Atlas stent delivery system was then advanced to the distal end of the microcatheter, the delivery microcatheter was then positioned such that the proximal portion of the landing zone stent would be just inside the left middle cerebral artery origin. The O ring on the delivery microcatheter was loosened. With slight forward gentle traction with the right hand on the delivery micro guidewire with the left hand the delivery microcatheter was retrieved unsheathing the distal and the proximal portion of the stent in excellent position. The tines of the stent opened completely proximally and distally. A control arteriogram performed through the large bore catheter demonstrated excellent revascularization of the inferior division into M3 M4 branches. The middle branch of the trifurcation remained occluded. The micro guidewire was reintroduced into the microcatheter and advanced without difficulty into the proximal portion of the deployed stent. Using a torque device, access was obtained into the middle branch without difficulty followed by the microcatheter was advanced to the M2 M3 region. The guidewire was removed. Good aspiration obtained from the hub of the microcatheter which was then connected to continuous heparinized saline infusion. Again another 4 mm x 24 mm Atlas Neuroform stent was advanced to the distal end of the microcatheter. This was again deployed such that the proximal portion of the stent was in the proximal portion of the left MCA. Following deployment, a control  arteriogram performed through the large bore catheter in the left middle cerebral artery demonstrated a TICI 2c revascularization. A control arteriogram performed approximately 5 minutes later demonstrated progressive slow flow in the middle branch of the stented segment. This prompted the use of approximately 6 mg of intra-arterial Integrilin for 10 minutes. A control arteriogram performed approximately 10 minutes later demonstrated slow revascularization of this stented branch. It was, therefore, decided to stop at this juncture. The large-bore catheter and the balloon guide catheter were retrieved and removed. A CT of the brain demonstrated no evidence of a gross hemorrhage, mass effect or midline shift. There was dense contrast staining in the basal ganglia region. Following this it was decided to proceed with a slow IV infusion of aggrastat and half the minimal dose run as an infusion over approximately 8-12 hours in order to protect the stented segments of the left middle cerebral artery. Patient was left intubated on account of the procedure, and also patient had received IV tPA and multiple dual antiplatelets. Additionally, the 8 French Pinnacle sheath in the right groin was left and connected to continuous heparinized saline infusion. Prior to the placement of rescue stents at the superior and inferior divisions of the left middle cerebral artery, a CT was performed of the brain on the table. This continued to demonstrate no  evidence of a gross hemorrhage, or mass effect or midline shift. Contrast stain was seen in the basal ganglia region. Distal pulses remained palpable in the dorsalis pedis, and the posterior tibial regions bilaterally. The patient was then transferred intubated to the neuro ICU to continue with post revascularization care. IMPRESSION: Status post endovascular revascularization of occluded left middle cerebral artery M1 segment, with 2 passes with the Solitaire 4 mm x 40 mm X retrieval  device, 1 pass with the 6 mm x 40 mm Solitaire X retrieval device, with Penumbra aspiration, and placement of a rescue stents in the superior and inferior divisions of the left middle cerebral artery achieving a TICI 2b to TICI 2c revascularization. PLAN: Follow-up in the clinic 4 weeks post discharge. Electronically Signed   By: Julieanne Cotton M.D.   On: 10/03/2019 09:56   IR CT Head Ltd  Result Date: 10/04/2019 INDICATION: New onset aphasia, right-sided paralysis, and left gaze deviation. 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 EXAM: 1. EMERGENT LARGE VESSEL OCCLUSION THROMBOLYSIS (anterior CIRCULATION) 2. Intracranial rescue stent placement COMPARISON:  CT angiogram of the head and neck of October 03, 2019. MEDICATIONS: Ancef 2 g IV antibiotic was administered within 1 hour of the procedure. ANESTHESIA/SEDATION: General anesthesia. CONTRAST:  Isovue 300 approximately 120 mL. FLUOROSCOPY TIME:  Fluoroscopy Time: 76 minutes 36 seconds (3040 mGy). COMPLICATIONS: None immediate. TECHNIQUE: Following a full explanation of the procedure along with the potential associated complications, an informed witnessed consent was obtained patient's spouse. The risks of intracranial hemorrhage of 10%, worsening neurological deficit, ventilator dependency, death and inability to revascularize were all reviewed in detail with the patient's spouse. The patient was then put under general anesthesia by the Department of Anesthesiology at [facility]. The right groin was prepped and draped in the usual sterile fashion. Thereafter using modified Seldinger technique, transfemoral access into the right common femoral artery was obtained without difficulty. Over a 0.035 inch guidewire an 8 French 25 cm Pinnacle sheath was inserted. Through this, and also over a 0.035 inch guidewire a connection of a Berenstein 125 cm catheter inside of an 087 95 cm balloon guide catheter was  advanced to the left common carotid artery. The guidewire, and the Berenstein support catheter were then removed. Good aspiration obtained from the hub of the balloon guide catheter. A gentle control arteriogram performed through balloon guide catheter demonstrated no evidence of spasms, dissections or of intraluminal filling defects. FINDINGS: However, the left common carotid arteriogram demonstrates the left external carotid artery and its major branches to be widely patent. The left internal carotid artery at the bulb to the cranial skull base also demonstrates wide patency. The petrous, the cavernous and the supraclinoid segments are intact. The left anterior cerebral artery opacifies into the capillary and venous phases with prompt cross-filling of the right anterior cerebral A2 segment from the left internal carotid artery injection. The left middle cerebral artery demonstrates complete occlusion in its mid M1 segment. The delayed arterial phase demonstrates partial retrograde opacification of the anterior to middle perisylvian branches. PROCEDURE: Over a 0.014 inch standard Synchro micro guidewire with a J configuration the combination of a Trevo ProVue 021 microcatheter inside of a 6 French 132 cm Catalyst guide catheter was advanced using biplane roadmap technique and constant fluoroscopic guidance to the supraclinoid left ICA. The left middle cerebral artery was then entered with the micro guidewire and into the superior division of the left middle cerebral  artery followed by the microcatheter. The guidewire was removed. Good aspiration obtained from the hub of the microcatheter. A gentle control arteriogram performed through this demonstrated safe position of the tip of the microcatheter which was then connected to continuous heparinized saline infusion. The 6 [place] Catalyst guide catheter was then advanced into the distal left middle cerebral artery with near complete occlusion of the aspiration with a  Penumbra aspiration device for approximately 2-1/2 minutes. A 4 mm x 40 mm Solitaire X retrieval device was then advanced to the distal end of the microcatheter and deployed with slight forward gentle traction with the right hand on the delivery micro guidewire, and retrieving the delivery microcatheter. Proximal flow arrest was then established by inflating the balloon of the balloon guide catheter in the mid left internal carotid artery. With constant aspiration applied at the hub of the 6 [place] Catalyst guide catheter, and with a 60 mL syringe at the hub of the balloon guide catheter, the combination of the retrieval device, and the microcatheter and the 6 [place] Catalyst catheter were retrieved and removed. Flow arrest was then reversed. A control arteriogram performed through the balloon guide catheter in the left internal carotid artery continued to demonstrate occluded left middle cerebral M1 segment. No evidence of clot was seen in the aspirate or entangled in the retrieval device. A second pass was then made again using the above combination. At this time access with the micro guidewire and the microcatheter was obtained in the inferior division of the left middle cerebral artery M2 M3 region where the microcatheter was positioned. The guidewire was removed. Good aspiration obtained from the hub of the microcatheter. A gentle control arteriogram performed through the microcatheter demonstrates safe position of the tip of the microcatheter. A 6 mm x 40 mm Solitaire X retrieval device was then advanced to the distal end of the microcatheter. The O ring on the delivery microcatheter was loosened. With slight forward gentle traction with the right hand on the delivery micro guidewire, with the left hand the delivery microcatheter was retrieved deploying the retrieval device. The 6 [place] Catalyst guide was now engaged again in the occluded left middle cerebral artery distally to near complete occlusion to  aspiration with the Penumbra aspiration device over 2-1/2 minutes. With proximal flow arrest and constant aspiration being applied at the hub of the balloon guide catheter in the left internal carotid artery, the combination was retrieved and removed. Again no evidence of a clot was seen in the aspirate or in the retrieval device. A control arteriogram performed through the balloon guide catheter in the left internal carotid artery again demonstrated no change in the occluded left middle cerebral artery M1 segment. A third pass was then made using a large-bore catheter inside of which was the 021 Trevo microcatheter again advanced as the combination over a 0.14 inch standard Synchro micro guidewire to the supraclinoid left ICA. Again the micro guidewire was then gently manipulated with a torque device and this time advanced into the middle branch of the left MCA trifurcation between the inferior and the superior divisions. This is then followed by the microcatheter into the M2 M3 region. The guidewire was removed. Good aspiration obtained from the hub of the microcatheter. A gentle control arteriogram performed through the microcatheter demonstrated safe position of the tip of the microcatheter which was then connected to continuous heparinized saline infusion. A 4 mm x 40 mm Solitaire X retrieval device was again advanced to the distal end of  the microcatheter. This was again deployed in the usual manner. A gentle control arteriogram performed through the 6 French large-bore catheter in the proximal left MCA now demonstrated a TICI 2c revascularization with an overlying severe atherosclerotic stenosis at the origin of the middle branch and extending into the origin of the inferior division. Again with proximal flow arrest in the left internal carotid artery, and constant aspiration with the Penumbra aspirate device with the large-bore catheter embedded in the occluded distal left MCA over 2-1/2 minutes the  combination was retrieved and removed. Again no evidence of clot was found in the retrieval device, or aspirate. A control arteriogram performed following flow reversal in the left internal carotid artery demonstrated angiographically occluded left middle cerebral artery. It was felt overlying pathology was related to an underlying atherosclerotic plaque causing significant stenosis. Following discussion with the referring neuro hospitalist, it was decided to proceed with rescue stenting. At this time, the patient was loaded with Brilinta 180 mg, and aspirin 81 mg via an orogastric tube. A combination of a Headway 17 2 tip microcatheter inside of a 014 inch standard Synchro micro guidewire inside of a large-bore support catheter was then advanced as mentioned earlier to the supraclinoid left ICA. The micro guidewire was then gently manipulated with a torque device and advanced without difficulty into the inferior division of the left MCA trifurcation followed by the microcatheter. The guidewire was removed. Good aspiration obtained from the hub of the Headway microcatheter. A gentle control arteriogram performed through microcatheter demonstrated safe position of the tip of the microcatheter. A 4 mm x 24 mm Atlas stent delivery system was then advanced to the distal end of the microcatheter, the delivery microcatheter was then positioned such that the proximal portion of the landing zone stent would be just inside the left middle cerebral artery origin. The O ring on the delivery microcatheter was loosened. With slight forward gentle traction with the right hand on the delivery micro guidewire with the left hand the delivery microcatheter was retrieved unsheathing the distal and the proximal portion of the stent in excellent position. The tines of the stent opened completely proximally and distally. A control arteriogram performed through the large bore catheter demonstrated excellent revascularization of the  inferior division into M3 M4 branches. The middle branch of the trifurcation remained occluded. The micro guidewire was reintroduced into the microcatheter and advanced without difficulty into the proximal portion of the deployed stent. Using a torque device, access was obtained into the middle branch without difficulty followed by the microcatheter was advanced to the M2 M3 region. The guidewire was removed. Good aspiration obtained from the hub of the microcatheter which was then connected to continuous heparinized saline infusion. Again another 4 mm x 24 mm Atlas Neuroform stent was advanced to the distal end of the microcatheter. This was again deployed such that the proximal portion of the stent was in the proximal portion of the left MCA. Following deployment, a control arteriogram performed through the large bore catheter in the left middle cerebral artery demonstrated a TICI 2c revascularization. A control arteriogram performed approximately 5 minutes later demonstrated progressive slow flow in the middle branch of the stented segment. This prompted the use of approximately 6 mg of intra-arterial Integrilin for 10 minutes. A control arteriogram performed approximately 10 minutes later demonstrated slow revascularization of this stented branch. It was, therefore, decided to stop at this juncture. The large-bore catheter and the balloon guide catheter were retrieved and removed. A CT of  the brain demonstrated no evidence of a gross hemorrhage, mass effect or midline shift. There was dense contrast staining in the basal ganglia region. Following this it was decided to proceed with a slow IV infusion of aggrastat and half the minimal dose run as an infusion over approximately 8-12 hours in order to protect the stented segments of the left middle cerebral artery. Patient was left intubated on account of the procedure, and also patient had received IV tPA and multiple dual antiplatelets. Additionally, the 8 French  Pinnacle sheath in the right groin was left and connected to continuous heparinized saline infusion. Prior to the placement of rescue stents at the superior and inferior divisions of the left middle cerebral artery, a CT was performed of the brain on the table. This continued to demonstrate no evidence of a gross hemorrhage, or mass effect or midline shift. Contrast stain was seen in the basal ganglia region. Distal pulses remained palpable in the dorsalis pedis, and the posterior tibial regions bilaterally. The patient was then transferred intubated to the neuro ICU to continue with post revascularization care. IMPRESSION: Status post endovascular revascularization of occluded left middle cerebral artery M1 segment, with 2 passes with the Solitaire 4 mm x 40 mm X retrieval device, 1 pass with the 6 mm x 40 mm Solitaire X retrieval device, with Penumbra aspiration, and placement of a rescue stents in the superior and inferior divisions of the left middle cerebral artery achieving a TICI 2b to TICI 2c revascularization. PLAN: Follow-up in the clinic 4 weeks post discharge. Electronically Signed   By: Julieanne Cotton M.D.   On: 10/03/2019 09:56   CT CEREBRAL PERFUSION W CONTRAST  Result Date: 10/02/2019 CLINICAL DATA:  Initial evaluation for acute headache, right-sided weakness. EXAM: CT ANGIOGRAPHY HEAD AND NECK CT PERFUSION BRAIN TECHNIQUE: Multidetector CT imaging of the head and neck was performed using the standard protocol during bolus administration of intravenous contrast. Multiplanar CT image reconstructions and MIPs were obtained to evaluate the vascular anatomy. Carotid stenosis measurements (when applicable) are obtained utilizing NASCET criteria, using the distal internal carotid diameter as the denominator. Multiphase CT imaging of the brain was performed following IV bolus contrast injection. Subsequent parametric perfusion maps were calculated using RAPID software. CONTRAST:  OMNIPAQUE  IOHEXOL 350 MG/ML SOLN COMPARISON:  Prior head CT from earlier same day. FINDINGS: CTA NECK FINDINGS Aortic arch: Visualized aortic arch normal in caliber. Bovine arch with common origin of the right brachiocephalic and left common carotid artery noted. Mild-to-moderate atheromatous plaque about the arch and origin of the great vessels without hemodynamically significant stenosis. Visualized subclavian arteries widely patent. Right carotid system: Right common carotid artery patent from its origin to the bifurcation without stenosis. Scattered eccentric calcified plaque about the right bifurcation/proximal right ICA with stenosis of up to approximately 50% by NASCET criteria. Right ICA otherwise widely patent distally to the skull base without stenosis, dissection, or occlusion. Left carotid system: Calcified plaque at the origin of the left CCA without high-grade stenosis. Left CCA otherwise patent to the bifurcation without stenosis. Mild scattered calcified plaque about the left bifurcation/proximal left ICA without significant stenosis. Left ICA widely patent distally to the skull base without stenosis, dissection or occlusion. Vertebral arteries: Both vertebral arteries arise from the subclavian arteries. Right vertebral artery dominant with a diffusely hypoplastic left vertebral artery. Focal atheromatous plaque at the origin of the right vertebral artery with approximate 50% stenosis. Right vertebral artery otherwise widely patent within the neck. Hypoplastic left  vertebral artery occluded at its origin. Distal reconstitution via muscular branches at the left V3 segment (series 7, image 158). Left vertebral is patent as it courses into the cranial vault. Skeleton: No acute osseous abnormality. No discrete or worrisome osseous lesions. Other neck: No other acute soft tissue abnormality within the neck. No adenopathy. Subcentimeter hypodense nodule noted within the right thyroid lobe, of doubtful significance  given size and patient age. No follow-up imaging recommended regarding this lesion. Upper chest: Visualized upper chest demonstrates no acute finding. Partially visualized lungs are clear. Paraseptal and centrilobular emphysematous changes noted at the lung apices. Review of the MIP images confirms the above findings CTA HEAD FINDINGS Anterior circulation: Petrous segments widely patent bilaterally. Cavernous and supraclinoid ICAs patent without flow-limiting stenosis. A1 segments patent bilaterally. Right A1 hypoplastic, accounting for the slightly diminutive right ICA is compared to the left. Normal anterior communicating artery. Anterior cerebral arteries widely patent to their distal aspects. Right M1 widely patent. Normal right MCA bifurcation. Distal right MCA branches well perfused. Left M1 patent proximally. There is occlusive thrombus within the distal left M1 segment/left MCA bifurcation (series 7, image 94). Fairly good perfusion seen within the left MCA branches distally, which could be related to subocclusive thrombus and/or collateralization. Posterior circulation: Vertebral arteries patent to the vertebrobasilar junction without stenosis. Neither PICA of visualized. Basilar widely patent to its distal aspect without stenosis. Superior cerebral arteries patent bilaterally. Both PCAs primarily supplied via the basilar and are well perfused or distal aspects. Small hypoplastic posterior communicating arteries noted bilaterally. Venous sinuses: Patent. Anatomic variants: None significant.  No aneurysm. Review of the MIP images confirms the above findings CT Brain Perfusion Findings: ASPECTS: 10 CBF (<30%) Volume: 19mL Perfusion (Tmax>6.0s) volume: 94mL Mismatch Volume: 75mL Infarction Location:Patchy acute core infarct seen involving the cortical and subcortical aspect of the mid and posterior left frontal region. Moderate sized surrounding ischemic penumbra. IMPRESSION: CTA HEAD AND NECK IMPRESSION: 1.  Positive CTA for emergent large vessel occlusion, with occlusive thrombus involving the distal left M1 segment/left MCA bifurcation. Fairly robust perfusion within the distal left MCA branches distally, could be related to subocclusive thrombus and/or collateralization. 2. 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 3. 50% stenosis at the origin of the dominant right vertebral artery. 4. Hypoplastic left vertebral artery, occluded within the neck, with distal reconstitution by the skull base. 5.  Emphysema (ICD10-J43.9). CT PERFUSION IMPRESSION: 19 cc acute core infarct involving the mid and posterior left frontal region, left MCA distribution. 94 cc surrounding ischemic penumbra, mismatch volume 75 cc. Critical Value/emergent results were called by telephone at the time of interpretation on 10/02/2019 at 9:05 pm to provider Dr. Deretha Emory, who verbally acknowledged these results. Electronically Signed   By: Rise Mu M.D.   On: 10/02/2019 21:31   DG Chest Port 1 View  Result Date: 10/05/2019 CLINICAL DATA:  Acute respiratory failure with hypoxia. EXAM: PORTABLE CHEST 1 VIEW COMPARISON:  10/03/2019 FINDINGS: The endotracheal tube and NG tubes have been removed. The cardiac silhouette, mediastinal and hilar contours are normal. The lungs are clear. No pleural effusions. The bony thorax is intact. IMPRESSION: No acute cardiopulmonary findings. Electronically Signed   By: Rudie Meyer M.D.   On: 10/05/2019 08:26   DG Chest Port 1 View  Result Date: 10/03/2019 CLINICAL DATA:  54 year old female status post endovascular treatment of left MCA M1 emergent large vessel occlusion. EXAM: PORTABLE CHEST 1 VIEW COMPARISON:  CTA head and neck earlier tonight. FINDINGS: Portable  AP supine view at 0141 hours. Endotracheal tube tip in good position between the level the clavicles and carina. Enteric tube courses to the stomach but the side hole is at the level of the distal esophagus. Normal cardiac  size and mediastinal contours. Allowing for portable technique the lungs are clear. No acute osseous abnormality identified. Negative visible bowel gas pattern. IMPRESSION: 1. Advance the enteric tube 5 cm to ensure side hole placement within the stomach. 2. Endotracheal tube tip in good position. 3.  No acute cardiopulmonary abnormality. Electronically Signed   By: Odessa Fleming M.D.   On: 10/03/2019 01:57   DG Abd Portable 1V  Result Date: 10/03/2019 CLINICAL DATA:  NG tube placement. EXAM: PORTABLE ABDOMEN - 1 VIEW COMPARISON:  Chest x-ray, same date. FINDINGS: The NG tube is in good position with its tip in the body region the stomach laterally. The proximal port is now well below the GE junction. Contrast noted in both renal collecting systems and ureters. There is also some contrast surrounding the right kidney suggesting a urinary leak or obstruction. Recommend clinical correlation. IMPRESSION: 1. NG tube in good position. 2. Contrast surrounding the right kidney suggesting a urinary leak or obstruction. Recommend clinical correlation. CT abdomen/pelvis may be helpful for further evaluation. Electronically Signed   By: Rudie Meyer M.D.   On: 10/03/2019 07:07   DG Swallowing Func-Speech Pathology  Result Date: 10/05/2019 Objective Swallowing Evaluation: Type of Study: MBS-Modified Barium Swallow Study  Patient Details Name: NETTY SULLIVANT MRN: [id number] Date of Birth: 10/09/1965 Today's Date: 10/05/2019 Time: SLP Start Time (ACUTE ONLY): 1323 -SLP Stop Time (ACUTE ONLY): 1334 SLP Time Calculation (min) (ACUTE ONLY): 11 min Past Medical History: No past medical history on file. Past Surgical History: Past Surgical History: Procedure Laterality Date . IR CT HEAD LTD  10/03/2019 . IR INTRA CRAN STENT  10/03/2019 . IR PATIENT EVAL TECH 0-60 MINS  10/03/2019 . IR PERCUTANEOUS ART THROMBECTOMY/INFUSION INTRACRANIAL INC DIAG ANGIO  10/03/2019 . 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 Intubated 3/8-3/10.  Subjective: alert, pleasant, not very communicative Assessment / Plan / Recommendation CHL IP CLINICAL IMPRESSIONS 10/05/2019 Clinical Impression Pt demonstrates a primary oral dysphagia with mild right anterior labial weakness and decreased lingual strength. Pt has mild anterior bolus loss with thin via cup, better containment with a straw. Pt also has mild residue on the lingual body that spills to pharynx post swallow, resulting in some instances of trace spillage to pyrifomr sinuses or even very trace penetration, all sensed and cleared with a second swallow. Pt able to masticate soft solids and again clears residue with a second swallow. Recommend dys 3 mech soft given UE weakness and potential difficulty cutting and preparing foods. Thin liquids. SLP will f/u for tolerance.  SLP Visit Diagnosis Dysphagia, unspecified (R13.10) Attention and concentration deficit following -- Frontal lobe and executive function deficit following -- Impact on safety and function Mild aspiration risk   CHL IP TREATMENT RECOMMENDATION 10/05/2019 Treatment Recommendations Therapy as outlined in treatment plan below   Prognosis 10/04/2019 Prognosis for Safe Diet Advancement Good Barriers to Reach Goals -- Barriers/Prognosis Comment -- CHL IP DIET RECOMMENDATION 10/05/2019 SLP Diet Recommendations Dysphagia 3 (Mech soft) solids;Thin liquid Liquid Administration via Straw Medication Administration Whole meds with puree  Compensations Slow rate;Small sips/bites Postural Changes Remain semi-upright after after feeds/meals (Comment);Seated upright at 90 degrees   CHL IP OTHER RECOMMENDATIONS 10/05/2019 Recommended Consults -- Oral Care Recommendations Oral care BID Other Recommendations --   CHL IP FOLLOW UP RECOMMENDATIONS  10/05/2019 Follow up Recommendations Inpatient Rehab   CHL IP FREQUENCY AND DURATION 10/05/2019 Speech Therapy Frequency (ACUTE ONLY) min 2x/week Treatment Duration --      CHL IP ORAL PHASE 10/05/2019 Oral Phase Impaired Oral - Pudding Teaspoon -- Oral - Pudding Cup -- Oral - Honey Teaspoon -- Oral - Honey Cup -- Oral - Nectar Teaspoon -- Oral - Nectar Cup -- Oral - Nectar Straw -- Oral - Thin Teaspoon -- Oral - Thin Cup Right anterior bolus loss Oral - Thin Straw Weak lingual manipulation;Lingual/palatal residue Oral - Puree Lingual/palatal residue Oral - Mech Soft -- Oral - Regular Lingual/palatal residue Oral - Multi-Consistency -- Oral - Pill Decreased bolus cohesion Oral Phase - Comment --  CHL IP PHARYNGEAL PHASE 10/05/2019 Pharyngeal Phase WFL Pharyngeal- Pudding Teaspoon -- Pharyngeal -- Pharyngeal- Pudding Cup -- Pharyngeal -- Pharyngeal- Honey Teaspoon -- Pharyngeal -- Pharyngeal- Honey Cup -- Pharyngeal -- Pharyngeal- Nectar Teaspoon -- Pharyngeal -- Pharyngeal- Nectar Cup -- Pharyngeal -- Pharyngeal- Nectar Straw -- Pharyngeal -- Pharyngeal- Thin Teaspoon -- Pharyngeal -- Pharyngeal- Thin Cup -- Pharyngeal -- Pharyngeal- Thin Straw -- Pharyngeal -- Pharyngeal- Puree -- Pharyngeal -- Pharyngeal- Mechanical Soft -- Pharyngeal -- Pharyngeal- Regular -- Pharyngeal -- Pharyngeal- Multi-consistency -- Pharyngeal -- Pharyngeal- Pill -- Pharyngeal -- Pharyngeal Comment --  No flowsheet data found. Harlon Ditty, MA CCC-SLP Acute Rehabilitation Services Pager [phone] Office [phone] Claudine Mouton 10/05/2019, 1:42 PM              IR PATIENT EVAL TECH 0-60 MINS  Result Date: 10/03/2019 Carlyon Prows     10/03/2019  2:34 PM Right 8 french sheath removal, no hematoma present prior to sheath removal.  Held manual pressure with a Quikclot pad for 25 minutes.  No complications and distal pulses still present.  Verified site with RN and gave instructions on when to remove Quikclot pad.  Lynann Beaver RT R,  VI [place] RT R  ECHOCARDIOGRAM COMPLETE  Result Date: 10/03/2019    ECHOCARDIOGRAM REPORT   Patient Name:   Erin Peck [facility] Date of Exam: 10/03/2019 Medical Rec #:  [id number]       Height:       65.0 in Accession #:    [id number]      Weight:       163.4 lb Date of Birth:  1966-02-11      BSA:          1.815 m Patient Age:    53 years        BP:           116/62 mmHg Patient Gender: F               HR:           93 bpm. Exam Location:  Inpatient Procedure: 2D Echo, Cardiac Doppler and Color Doppler Indications:   CVA  History:       Patient has no prior history of Echocardiogram examinations.                Stroke.  Sonographer:   Thurman Coyer RDCS (AE) Referring      [id number] ASHISH ARORA Phys: IMPRESSIONS  1. Left ventricular ejection fraction, by estimation, is 60 to 65%. The  left ventricle has normal function. The left ventricle has no regional wall motion abnormalities. Left ventricular diastolic parameters were normal.  2. Right ventricular systolic function is normal. The right ventricular size is normal.  3. The mitral valve is normal in structure. No evidence of mitral valve regurgitation. No evidence of mitral stenosis.  4. The aortic valve is normal in structure. Aortic valve regurgitation is not visualized. No aortic stenosis is present.  5. The inferior vena cava is normal in size with greater than 50% respiratory variability, suggesting right atrial pressure of 3 mmHg. Comparison(s): No prior Echocardiogram. FINDINGS  Left Ventricle: Left ventricular ejection fraction, by estimation, is 60 to 65%. The left ventricle has normal function. The left ventricle has no regional wall motion abnormalities. The left ventricular internal cavity size was normal in size. There is  no left ventricular hypertrophy. Left ventricular diastolic parameters were normal. Normal left ventricular filling pressure. Right Ventricle: The right ventricular size is normal. No increase in right ventricular wall  thickness. Right ventricular systolic function is normal. Left Atrium: Left atrial size was normal in size. Right Atrium: Right atrial size was normal in size. Pericardium: There is no evidence of pericardial effusion. Mitral Valve: The mitral valve is normal in structure. Normal mobility of the mitral valve leaflets. No evidence of mitral valve regurgitation. No evidence of mitral valve stenosis. Tricuspid Valve: The tricuspid valve is normal in structure. Tricuspid valve regurgitation is not demonstrated. No evidence of tricuspid stenosis. Aortic Valve: The aortic valve is normal in structure. Aortic valve regurgitation is not visualized. No aortic stenosis is present. Pulmonic Valve: The pulmonic valve was normal in structure. Pulmonic valve regurgitation is not visualized. No evidence of pulmonic stenosis. Aorta: The aortic root is normal in size and structure. Venous: The inferior vena cava is normal in size with greater than 50% respiratory variability, suggesting right atrial pressure of 3 mmHg. IAS/Shunts: No atrial level shunt detected by color flow Doppler.  LEFT VENTRICLE PLAX 2D LVIDd:         3.86 cm  Diastology LVIDs:         2.44 cm  LV e' lateral:   9.36 cm/s LV PW:         0.78 cm  LV E/e' lateral: 9.3 LV IVS:        0.90 cm  LV e' medial:    8.59 cm/s LVOT diam:     2.00 cm  LV E/e' medial:  10.1 LV SV:         76 LV SV Index:   42 LVOT Area:     3.14 cm  RIGHT VENTRICLE RV S prime:     15.80 cm/s TAPSE (M-mode): 2.5 cm LEFT ATRIUM             Index       RIGHT ATRIUM          Index LA diam:        3.20 cm 1.76 cm/m  RA Area:     9.71 cm LA Vol (A2C):   26.7 ml 14.71 ml/m RA Volume:   17.60 ml 9.70 ml/m LA Vol (A4C):   33.7 ml 18.57 ml/m LA Biplane Vol: 31.1 ml 17.13 ml/m  AORTIC VALVE LVOT Vmax:   110.00 cm/s LVOT Vmean:  74.700 cm/s LVOT VTI:    0.241 m  AORTA Ao Root diam: 2.60 cm MITRAL VALVE MV Area (PHT): 3.37 cm    SHUNTS MV Decel Time: 225 msec  Systemic VTI:  0.24 m MV E  velocity: 86.90 cm/s  Systemic Diam: 2.00 cm MV A velocity: 88.00 cm/s MV E/A ratio:  0.99 Mihai Croitoru MD Electronically signed by Thurmon Fair MD Signature Date/Time: 10/03/2019/3:15:50 PM    Final    IR PERCUTANEOUS ART THROMBECTOMY/INFUSION INTRACRANIAL INC DIAG ANGIO  Result Date: 10/04/2019 INDICATION: New onset aphasia, right-sided paralysis, and left gaze deviation. 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 EXAM: 1. EMERGENT LARGE VESSEL OCCLUSION THROMBOLYSIS (anterior CIRCULATION) 2. Intracranial rescue stent placement COMPARISON:  CT angiogram of the head and neck of October 03, 2019. MEDICATIONS: Ancef 2 g IV antibiotic was administered within 1 hour of the procedure. ANESTHESIA/SEDATION: General anesthesia. CONTRAST:  Isovue 300 approximately 120 mL. FLUOROSCOPY TIME:  Fluoroscopy Time: 76 minutes 36 seconds (3040 mGy). COMPLICATIONS: None immediate. TECHNIQUE: Following a full explanation of the procedure along with the potential associated complications, an informed witnessed consent was obtained patient's spouse. The risks of intracranial hemorrhage of 10%, worsening neurological deficit, ventilator dependency, death and inability to revascularize were all reviewed in detail with the patient's spouse. The patient was then put under general anesthesia by the Department of Anesthes[facility] at [facility]. The right groin was prepped and draped in the usual sterile fashion. Thereafter using modified Seldinger technique, transfemoral access into the right common femoral artery was obtained without difficulty. Over a 0.035 inch guidewire an 8 French 25 cm Pinnacle sheath was inserted. Through this, and also over a 0.035 inch guidewire a connection of a Berenstein 125 cm catheter inside of an 087 95 cm balloon guide catheter was advanced to the left common carotid artery. The guidewire, and the Berenstein support catheter were then removed. Good  aspiration obtained from the hub of the balloon guide catheter. A gentle control arteriogram performed through balloon guide catheter demonstrated no evidence of spasms, dissections or of intraluminal filling defects. FINDINGS: However, the left common carotid arteriogram demonstrates the left external carotid artery and its major branches to be widely patent. The left internal carotid artery at the bulb to the cranial skull base also demonstrates wide patency. The petrous, the cavernous and the supraclinoid segments are intact. The left anterior cerebral artery opacifies into the capillary and venous phases with prompt cross-filling of the right anterior cerebral A2 segment from the left internal carotid artery injection. The left middle cerebral artery demonstrates complete occlusion in its mid M1 segment. The delayed arterial phase demonstrates partial retrograde opacification of the anterior to middle perisylvian branches. PROCEDURE: Over a 0.014 inch standard Synchro micro guidewire with a J configuration the combination of a Trevo ProVue 021 microcatheter inside of a 6 French 132 cm Catalyst guide catheter was advanced using biplane roadmap technique and constant fluoroscopic guidance to the supraclinoid left ICA. The left middle cerebral artery was then entered with the micro guidewire and into the superior division of the left middle cerebral artery followed by the microcatheter. The guidewire was removed. Good aspiration obtained from the hub of the microcatheter. A gentle control arteriogram performed through this demonstrated safe position of the tip of the microcatheter which was then connected to continuous heparinized saline infusion. The 6 [place] Catalyst guide catheter was then advanced into the distal left middle cerebral artery with near complete occlusion of the aspiration with a Penumbra aspiration device for approximately 2-1/2 minutes. A 4 mm x 40 mm Solitaire X retrieval device was then  advanced to the distal end of the  microcatheter and deployed with slight forward gentle traction with the right hand on the delivery micro guidewire, and retrieving the delivery microcatheter. Proximal flow arrest was then established by inflating the balloon of the balloon guide catheter in the mid left internal carotid artery. With constant aspiration applied at the hub of the 6 [place] Catalyst guide catheter, and with a 60 mL syringe at the hub of the balloon guide catheter, the combination of the retrieval device, and the microcatheter and the 6 [place] Catalyst catheter were retrieved and removed. Flow arrest was then reversed. A control arteriogram performed through the balloon guide catheter in the left internal carotid artery continued to demonstrate occluded left middle cerebral M1 segment. No evidence of clot was seen in the aspirate or entangled in the retrieval device. A second pass was then made again using the above combination. At this time access with the micro guidewire and the microcatheter was obtained in the inferior division of the left middle cerebral artery M2 M3 region where the microcatheter was positioned. The guidewire was removed. Good aspiration obtained from the hub of the microcatheter. A gentle control arteriogram performed through the microcatheter demonstrates safe position of the tip of the microcatheter. A 6 mm x 40 mm Solitaire X retrieval device was then advanced to the distal end of the microcatheter. The O ring on the delivery microcatheter was loosened. With slight forward gentle traction with the right hand on the delivery micro guidewire, with the left hand the delivery microcatheter was retrieved deploying the retrieval device. The 6 [place] Catalyst guide was now engaged again in the occluded left middle cerebral artery distally to near complete occlusion to aspiration with the Penumbra aspiration device over 2-1/2 minutes. With proximal flow arrest and constant aspiration  being applied at the hub of the balloon guide catheter in the left internal carotid artery, the combination was retrieved and removed. Again no evidence of a clot was seen in the aspirate or in the retrieval device. A control arteriogram performed through the balloon guide catheter in the left internal carotid artery again demonstrated no change in the occluded left middle cerebral artery M1 segment. A third pass was then made using a large-bore catheter inside of which was the 021 Trevo microcatheter again advanced as the combination over a 0.14 inch standard Synchro micro guidewire to the supraclinoid left ICA. Again the micro guidewire was then gently manipulated with a torque device and this time advanced into the middle branch of the left MCA trifurcation between the inferior and the superior divisions. This is then followed by the microcatheter into the M2 M3 region. The guidewire was removed. Good aspiration obtained from the hub of the microcatheter. A gentle control arteriogram performed through the microcatheter demonstrated safe position of the tip of the microcatheter which was then connected to continuous heparinized saline infusion. A 4 mm x 40 mm Solitaire X retrieval device was again advanced to the distal end of the microcatheter. This was again deployed in the usual manner. A gentle control arteriogram performed through the 6 French large-bore catheter in the proximal left MCA now demonstrated a TICI 2c revascularization with an overlying severe atherosclerotic stenosis at the origin of the middle branch and extending into the origin of the inferior division. Again with proximal flow arrest in the left internal carotid artery, and constant aspiration with the Penumbra aspirate device with the large-bore catheter embedded in the occluded distal left MCA over 2-1/2 minutes the combination was retrieved and removed. Again no  evidence of clot was found in the retrieval device, or aspirate. A control  arteriogram performed following flow reversal in the left internal carotid artery demonstrated angiographically occluded left middle cerebral artery. It was felt overlying pathology was related to an underlying atherosclerotic plaque causing significant stenosis. Following discussion with the referring neuro hospitalist, it was decided to proceed with rescue stenting. At this time, the patient was loaded with Brilinta 180 mg, and aspirin 81 mg via an orogastric tube. A combination of a Headway 17 2 tip microcatheter inside of a 014 inch standard Synchro micro guidewire inside of a large-bore support catheter was then advanced as mentioned earlier to the supraclinoid left ICA. The micro guidewire was then gently manipulated with a torque device and advanced without difficulty into the inferior division of the left MCA trifurcation followed by the microcatheter. The guidewire was removed. Good aspiration obtained from the hub of the Headway microcatheter. A gentle control arteriogram performed through microcatheter demonstrated safe position of the tip of the microcatheter. A 4 mm x 24 mm Atlas stent delivery system was then advanced to the distal end of the microcatheter, the delivery microcatheter was then positioned such that the proximal portion of the landing zone stent would be just inside the left middle cerebral artery origin. The O ring on the delivery microcatheter was loosened. With slight forward gentle traction with the right hand on the delivery micro guidewire with the left hand the delivery microcatheter was retrieved unsheathing the distal and the proximal portion of the stent in excellent position. The tines of the stent opened completely proximally and distally. A control arteriogram performed through the large bore catheter demonstrated excellent revascularization of the inferior division into M3 M4 branches. The middle branch of the trifurcation remained occluded. The micro guidewire was  reintroduced into the microcatheter and advanced without difficulty into the proximal portion of the deployed stent. Using a torque device, access was obtained into the middle branch without difficulty followed by the microcatheter was advanced to the M2 M3 region. The guidewire was removed. Good aspiration obtained from the hub of the microcatheter which was then connected to continuous heparinized saline infusion. Again another 4 mm x 24 mm Atlas Neuroform stent was advanced to the distal end of the microcatheter. This was again deployed such that the proximal portion of the stent was in the proximal portion of the left MCA. Following deployment, a control arteriogram performed through the large bore catheter in the left middle cerebral artery demonstrated a TICI 2c revascularization. A control arteriogram performed approximately 5 minutes later demonstrated progressive slow flow in the middle branch of the stented segment. This prompted the use of approximately 6 mg of intra-arterial Integrilin for 10 minutes. A control arteriogram performed approximately 10 minutes later demonstrated slow revascularization of this stented branch. It was, therefore, decided to stop at this juncture. The large-bore catheter and the balloon guide catheter were retrieved and removed. A CT of the brain demonstrated no evidence of a gross hemorrhage, mass effect or midline shift. There was dense contrast staining in the basal ganglia region. Following this it was decided to proceed with a slow IV infusion of aggrastat and half the minimal dose run as an infusion over approximately 8-12 hours in order to protect the stented segments of the left middle cerebral artery. Patient was left intubated on account of the procedure, and also patient had received IV tPA and multiple dual antiplatelets. Additionally, the 8 French Pinnacle sheath in the right groin was  left and connected to continuous heparinized saline infusion. Prior to the  placement of rescue stents at the superior and inferior divisions of the left middle cerebral artery, a CT was performed of the brain on the table. This continued to demonstrate no evidence of a gross hemorrhage, or mass effect or midline shift. Contrast stain was seen in the basal ganglia region. Distal pulses remained palpable in the dorsalis pedis, and the posterior tibial regions bilaterally. The patient was then transferred intubated to the neuro ICU to continue with post revascularization care. IMPRESSION: Status post endovascular revascularization of occluded left middle cerebral artery M1 segment, with 2 passes with the Solitaire 4 mm x 40 mm X retrieval device, 1 pass with the 6 mm x 40 mm Solitaire X retrieval device, with Penumbra aspiration, and placement of a rescue stents in the superior and inferior divisions of the left middle cerebral artery achieving a TICI 2b to TICI 2c revascularization. PLAN: Follow-up in the clinic 4 weeks post discharge. Electronically Signed   By: Julieanne Cotton M.D.   On: 10/03/2019 09:56   CT HEAD CODE STROKE WO CONTRAST  Result Date: 10/02/2019 CLINICAL DATA:  Code stroke. 54 year old female with severe headache and right side weakness since 1750 hours. EXAM: CT HEAD WITHOUT CONTRAST TECHNIQUE: Contiguous axial images were obtained from the base of the skull through the vertex without intravenous contrast. COMPARISON:  None. FINDINGS: Brain: No midline shift, mass effect, or evidence of intracranial mass lesion. No ventriculomegaly. No acute intracranial hemorrhage identified. No cortically based acute infarct identified. Chronic appearing white matter hypodensity at the anterior left corona radiata on series 2, image 16. Elsewhere gray-white matter differentiation is within normal limits. Vascular: No suspicious intracranial vascular hyperdensity. Skull: Negative. Sinuses/Orbits: Clear aside from some right posterior ethmoid air cell mucosal thickening. Mastoids  and tympanic cavities appear clear. Other: Visualized orbits and scalp soft tissues are within normal limits. ASPECTS [facility] Stroke Program Early CT Score) Total score (0-10 with 10 being normal): 10 IMPRESSION: 1. No acute cortically based infarct or acute intracranial hemorrhage identified. ASPECTS 10. 2. 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 Study discussed by telephone with Dr. Vanetta Mulders on 10/02/2019 at 20:18 . Electronically Signed   By: Odessa Fleming M.D.   On: 10/02/2019 20:20   VAS [place] LOWER EXTREMITY VENOUS (DVT)  Result Date: 10/04/2019  Lower Venous DVTStudy Indications: Stroke.  Comparison Study: No prior exam. Performing Technologist: Kennedy Bucker ARDMS, RVT  Examination Guidelines: A complete evaluation includes B-mode imaging, spectral Doppler, color Doppler, and power Doppler as needed of all accessible portions of each vessel. Bilateral testing is considered an integral part of a complete examination. Limited examinations for reoccurring indications may be performed as noted. The reflux portion of the exam is performed with the patient in reverse Trendelenburg.  +---------+---------------+---------+-----------+----------+-------------------+ RIGHT    CompressibilityPhasicitySpontaneityPropertiesThrombus Aging      +---------+---------------+---------+-----------+----------+-------------------+ CFV      Full           Yes      Yes                                      +---------+---------------+---------+-----------+----------+-------------------+ SFJ      Full                                                             +---------+---------------+---------+-----------+----------+-------------------+  FV Prox  Full                                                             +---------+---------------+---------+-----------+----------+-------------------+ FV Mid   Full                                                              +---------+---------------+---------+-----------+----------+-------------------+ FV DistalFull                                                             +---------+---------------+---------+-----------+----------+-------------------+ PFV                                                   not visualized due                                                        to bandage          +---------+---------------+---------+-----------+----------+-------------------+ POP      Full           Yes      Yes                                      +---------+---------------+---------+-----------+----------+-------------------+ PTV      Full                                                             +---------+---------------+---------+-----------+----------+-------------------+ PERO     Full                                                             +---------+---------------+---------+-----------+----------+-------------------+   +---------+---------------+---------+-----------+----------+--------------+ LEFT     CompressibilityPhasicitySpontaneityPropertiesThrombus Aging +---------+---------------+---------+-----------+----------+--------------+ CFV      Full           Yes      Yes                                 +---------+---------------+---------+-----------+----------+--------------+ SFJ      Full                                                        +---------+---------------+---------+-----------+----------+--------------+  FV Prox  Full                                                        +---------+---------------+---------+-----------+----------+--------------+ FV Mid   Full                                                        +---------+---------------+---------+-----------+----------+--------------+ FV DistalFull                                                         +---------+---------------+---------+-----------+----------+--------------+ PFV      Full                                                        +---------+---------------+---------+-----------+----------+--------------+ POP      Full           Yes      Yes                                 +---------+---------------+---------+-----------+----------+--------------+ PTV      Full                                                        +---------+---------------+---------+-----------+----------+--------------+ PERO     Full                                                        +---------+---------------+---------+-----------+----------+--------------+     Summary: BILATERAL: - No evidence of deep vein thrombosis seen in the lower extremities, bilaterally.  RIGHT: - No cystic structure found in the popliteal fossa.  LEFT: - No cystic structure found in the popliteal fossa.  *See table(s) above for measurements and observations. Electronically signed by Gretta Began MD on 10/04/2019 at 2:47:34 PM.    Final     12-lead ECG SR (personally reviewed) All prior EKG's in EPIC reviewed with no documented atrial fibrillation  Telemetry SR (personally reviewed)  Assessment and Plan:  1. Cryptogenic stroke The patient presents with cryptogenic stroke.  The patient has a TEE planned for later today.  I spoke at length with the patient about monitoring for afib with an implantable loop recorder.  Risks, benefits, and alteratives to implantable loop recorder were discussed with the patient today.   At this time, the patient is very clear in their decision to proceed with implantable loop recorder. She however does not currently have insurance. It is her understanding  that someone from the hospital is going to be reaching out to help with that. She would like to reconsider as an outpatient once insurance is obtained.  Outpatient follow up in 4 weeks scheduled.   Please call with  questions.   Gypsy Balsam, NP 10/06/2019 8:31 AM  I have seen, examined the patient, and reviewed the above assessment and plan.  Changes to above are made where necessary.  On exam, RRR.  The patient does not have health insurance.  She wishes to defer further cardiac monitoring as above.  She will follow-up with EP as an outpatient.  Hopefully, once she has insurance we can proceed.  Electrophysiology team to see as needed while here. Please call with questions.   Co Sign: Hillis Range, MD 10/06/2019 2:19 PM

## 2019-10-06 NOTE — Progress Notes (Signed)
Physical Therapy Treatment Patient Details Name: Erin Peck MRN: [id number] DOB: 08/14/1965 Today's Date: 10/06/2019    History of Present Illness 54 year old female with acute Left MCA M1 occlusion s/p tPA and mechanical thrombectomy returning to ICU. Intubated 3/8-3/10. No PMHx on file on admission.    PT Comments    Pt making excellent progress towards her physical therapy goals, demonstrating improved right sided weakness, activity tolerance, and increased ambulation distance. Requiring min assist for transfers and ambulating 16 ft + 40 ft with a walker at a moderate assist level (close chair follow utilized). Pt continues with gait abnormalities, right hemiplegia, inattention, decreased coordination, and decreased endurance. Also with cognitive deficits I.e. pt received with urine/bowel incontinence and had not pressed the call button for assistance. Would still greatly benefit from CIR to promote neuro recovery and suspect continued progress based on age, PLOF, motivation and family support.     Follow Up Recommendations  CIR;Supervision/Assistance - 24 hour     Equipment Recommendations  Rolling walker with 5" wheels    Recommendations for Other Services Rehab consult     Precautions / Restrictions Precautions Precautions: Fall Restrictions Weight Bearing Restrictions: No    Mobility  Bed Mobility Overal bed mobility: Needs Assistance Bed Mobility: Supine to Sit     Supine to sit: Min guard     General bed mobility comments: Min Guard A for safety  Transfers Overall transfer level: Needs assistance Equipment used: Rolling walker (2 wheeled) Transfers: Sit to/from Stand Sit to Stand: +2 safety/equipment;Min assist         General transfer comment: MinA to power up from edge of bed, cues for hand placement.  Ambulation/Gait Ambulation/Gait assistance: Mod assist;+2 safety/equipment Gait Distance (Feet): 56 Feet(16", 40") Assistive device: Rolling  walker (2 wheeled) Gait Pattern/deviations: Decreased stride length;Step-through pattern;Narrow base of support;Decreased step length - right;Decreased dorsiflexion - right;Scissoring;Step-to pattern Gait velocity: decreased   General Gait Details: Pt ambulating 16 ft then 40 ft with modA and close chair follow. Cues for sequencing, wider BOS, larger R step length. Pt initially with step to pattern, progressing to step through with narrow BOS and scissoring. Tendency for right drift    Stairs             Wheelchair Mobility    Modified Rankin (Stroke Patients Only) Modified Rankin (Stroke Patients Only) Pre-Morbid Rankin Score: No symptoms Modified Rankin: Moderately severe disability     Balance Overall balance assessment: Needs assistance Sitting-balance support: No upper extremity supported;Feet supported Sitting balance-Leahy Scale: Fair     Standing balance support: Bilateral upper extremity supported;During functional activity Standing balance-Leahy Scale: Poor Standing balance comment: Reliant on physical A                            Cognition Arousal/Alertness: Awake/alert Behavior During Therapy: Flat affect Overall Cognitive Status: Impaired/Different from baseline Area of Impairment: Attention;Awareness;Memory;Following commands;Safety/judgement;Problem solving                   Current Attention Level: Sustained Memory: Decreased recall of precautions;Decreased short-term memory(and long-term memory) Following Commands: Follows one step commands consistently;Follows multi-step commands inconsistently;Follows one step commands with increased time Safety/Judgement: Decreased awareness of safety;Decreased awareness of deficits Awareness: (pre-intellectual) Problem Solving: Slow processing General Comments: Pt received with bowel/urine incontinence and had not pressed the call bell for help. Following all 1 step commands consistently; will not  state when she is tired or needs  a rest break and needs to be specifically asked.      Exercises      General Comments        Pertinent Vitals/Pain Pain Assessment: No/denies pain    Home Living                      Prior Function            PT Goals (current goals can now be found in the care plan section) Acute Rehab PT Goals Patient Stated Goal: Get better PT Goal Formulation: With patient Time For Goal Achievement: 10/19/19 Potential to Achieve Goals: Good Progress towards PT goals: Progressing toward goals    Frequency    Min 4X/week      PT Plan Current plan remains appropriate    Co-evaluation              AM-PAC PT "6 Clicks" Mobility   Outcome Measure  Help needed turning from your back to your side while in a flat bed without using bedrails?: A Little Help needed moving from lying on your back to sitting on the side of a flat bed without using bedrails?: A Little Help needed moving to and from a bed to a chair (including a wheelchair)?: A Lot Help needed standing up from a chair using your arms (e.g., wheelchair or bedside chair)?: A Lot Help needed to walk in hospital room?: A Lot Help needed climbing 3-5 steps with a railing? : Total 6 Click Score: 13    End of Session Equipment Utilized During Treatment: Gait belt Activity Tolerance: Patient tolerated treatment well Patient left: in chair;with call bell/phone within reach;with chair alarm set Nurse Communication: Mobility status PT Visit Diagnosis: Hemiplegia and hemiparesis;Other abnormalities of gait and mobility (R26.89) Hemiplegia - Right/Left: Right Hemiplegia - dominant/non-dominant: Dominant Hemiplegia - caused by: Cerebral infarction     Time: 4401-0272 PT Time Calculation (min) (ACUTE ONLY): 26 min  Charges:  $Gait Training: 8-22 mins $Therapeutic Activity: 8-22 mins                       Wyona Almas, PT, DPT Acute Rehabilitation Services Pager  [phone] Office [phone]    Deno Etienne 10/06/2019, 1:04 PM

## 2019-10-06 NOTE — Interval H&P Note (Signed)
History and Physical Interval Note:  10/06/2019 1:42 PM  Erin Peck  has presented today for surgery, with the diagnosis of stoke.  The various methods of treatment have been discussed with the patient and family. After consideration of risks, benefits and other options for treatment, the patient has consented to  Procedure(s): TRANSESOPHAGEAL ECHOCARDIOGRAM (TEE) (N/A) as a surgical intervention.  The patient's history has been reviewed, patient examined, no change in status, stable for surgery.  I have reviewed the patient's chart and labs.  Questions were answered to the patient's satisfaction.     Armanda Magic

## 2019-10-06 NOTE — Discharge Summary (Addendum)
Stroke Discharge Summary  Patient ID: Erin Peck   MRN: [id number]      DOB: 06/28/1966  Date of Admission: 10/02/2019 Date of Discharge: 10/07/2019  Attending Physician:  Marvel Plan, MD, Stroke MD Consultant(s):  Roxanne Gates) Corliss Skains, MD (Interventional Neuroradiologist), Coralyn Helling, MD (pulmonary/intensive care ), Genice Rouge, MD (Physical Medicine & Rehabilitation), Hillis Range, MD (electrophysiology)  Patient's PCP:  Patient, No Pcp Per  Discharge Diagnoses:   Left MCA infarcts due to left M1 occlusion s/p tPA and IR w/ TICI2C revascularization with L MCA stent.  Acute ischemic L MCA stroke (HCC) - cryptogenic - s/p partial revascularization  Suspected right kidney urinary leak or obstruction -> CT of Abdomen / Pelvis showed no leak.  Hypotension -> midodrine 5 mg tid  Accelerated hypertension  Hyperlipidemia -> Lipitor  Dysphagia following cerebrovascular accident  Hypoglycemia  Family hx-stroke  Complicated migraine  Acute blood loss anemia  Hypokalemia - treated and resolved   Aphasia -> Amantadine  Small right pleural effusion.  Aortic atherosclerosis (ICD10-I70.0).   Medications to be continued on Rehab Allergies as of 10/07/2019   No Known Allergies     Medication List    TAKE these medications   amantadine 100 MG capsule Commonly known as: SYMMETREL Take 1 capsule (100 mg total) by mouth daily. Start taking on: October 08, 2019   aspirin 325 MG EC tablet Take 1 tablet (325 mg total) by mouth daily. Start taking on: October 08, 2019   atorvastatin 80 MG tablet Commonly known as: LIPITOR Take 1 tablet (80 mg total) by mouth daily at 6 PM.   clopidogrel 75 MG tablet Commonly known as: PLAVIX Take 1 tablet (75 mg total) by mouth daily. Start taking on: October 08, 2019   ibuprofen 200 MG tablet Commonly known as: ADVIL Take 200 mg by mouth every 6 (six) hours as needed for mild pain or moderate pain.   midodrine 5 MG  tablet Commonly known as: PROAMATINE Take 1 tablet (5 mg total) by mouth 3 (three) times daily with meals.            Durable Medical Equipment  (From admission, onward)         Start     Ordered   10/06/19 1346  For home use only DME Walker rolling  Once    Question Answer Comment  Walker: With 5 Inch Wheels   Patient needs a walker to treat with the following condition Stroke (HCC)      10/06/19 1346          LABORATORY STUDIES CBC    Component Value Date/Time   WBC 9.1 10/07/2019 0415   RBC 3.30 (L) 10/07/2019 0415   HGB 10.1 (L) 10/07/2019 0415   HCT 30.5 (L) 10/07/2019 0415   PLT 196 10/07/2019 0415   MCV 92.4 10/07/2019 0415   MCH 30.6 10/07/2019 0415   MCHC 33.1 10/07/2019 0415   RDW 12.5 10/07/2019 0415   LYMPHSABS 1.7 10/03/2019 0212   MONOABS 0.3 10/03/2019 0212   EOSABS 0.0 10/03/2019 0212   BASOSABS 0.0 10/03/2019 0212   CMP    Component Value Date/Time   NA 142 10/07/2019 0415   K 4.3 10/07/2019 0415   CL 110 10/07/2019 0415   CO2 19 (L) 10/07/2019 0415   GLUCOSE 83 10/07/2019 0415   BUN 10 10/07/2019 0415   CREATININE 0.73 10/07/2019 0415   CALCIUM 8.7 (L) 10/07/2019 0415   PROT 7.4 10/02/2019 1953  ALBUMIN 4.5 10/02/2019 1953   AST 17 10/02/2019 1953   ALT 19 10/02/2019 1953   ALKPHOS 74 10/02/2019 1953   BILITOT 0.4 10/02/2019 1953   GFRNONAA >60 10/07/2019 0415   GFRAA >60 10/07/2019 0415   COAGS Lab Results  Component Value Date   INR 0.9 10/02/2019   Lipid Panel    Component Value Date/Time   CHOL 236 (H) 10/03/2019 0212   TRIG 187 (H) 10/03/2019 0555   HDL 49 10/03/2019 0212   CHOLHDL 4.8 10/03/2019 0212   VLDL 35 10/03/2019 0212   LDLCALC 152 (H) 10/03/2019 0212   HgbA1C  Lab Results  Component Value Date   HGBA1C 5.3 10/03/2019   Urinalysis    Component Value Date/Time   COLORURINE STRAW (A) 10/03/2019 0937   APPEARANCEUR CLEAR 10/03/2019 0937   LABSPEC 1.018 10/03/2019 0937   PHURINE 5.0 10/03/2019 0937    GLUCOSEU NEGATIVE 10/03/2019 0937   HGBUR MODERATE (A) 10/03/2019 0937   BILIRUBINUR NEGATIVE 10/03/2019 0937   KETONESUR NEGATIVE 10/03/2019 0937   PROTEINUR NEGATIVE 10/03/2019 0937   NITRITE NEGATIVE 10/03/2019 0937   LEUKOCYTESUR NEGATIVE 10/03/2019 0937   Urine Drug Screen     Component Value Date/Time   LABOPIA NONE DETECTED 10/03/2019 0937   COCAINSCRNUR NONE DETECTED 10/03/2019 0937   LABBENZ NONE DETECTED 10/03/2019 0937   AMPHETMU POSITIVE (A) 10/03/2019 0937   THCU NONE DETECTED 10/03/2019 0937   LABBARB NONE DETECTED 10/03/2019 0937    Alcohol Level    Component Value Date/Time   ETH <10 10/02/2019 1953     SIGNIFICANT DIAGNOSTIC STUDIES  CT ANGIO HEAD W OR WO CONTRAST 10/04/2019 IMPRESSION:  1. Y stenting of the left M1 and M2 segments with nonocclusive in stent thrombus narrowing the upper limb.  2. Acute left MCA territory infarct is unchanged from brain MRI yesterday.    CT ANGIO HEAD W OR WO CONTRAST  CT ANGIO NECK W OR WO CONTRAST CT CEREBRAL PERFUSION W CONTRAST 10/02/2019 IMPRESSION:   CTA HEAD AND NECK  IMPRESSION:  1. Positive CTA for emergent large vessel occlusion, with occlusive thrombus involving the distal left M1 segment/left MCA bifurcation. Fairly robust perfusion within the distal left MCA branches distally, could be related to subocclusive thrombus and/or collateralization.  2. 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  3. 50% stenosis at the origin of the dominant right vertebral artery.  4. Hypoplastic left vertebral artery, occluded within the neck, with distal reconstitution by the skull base.  5.  Emphysema (ICD10-J43.9).   CT PERFUSION  IMPRESSION:  19 cc acute core infarct involving the mid and posterior left frontal region, left MCA distribution. 94 cc surrounding ischemic penumbra, mismatch volume 75 cc.   MR ANGIO HEAD WO CONTRAST MR BRAIN WO CONTRAST 10/03/2019 IMPRESSION:  1. The Left MCA was stented  which is at least partially responsible for the decreased left M1 flow signal. However, decreased visualization of left MCA branches on MRA and increased branch FLAIR signal raises the possibility of residual hemodynamically significant MCA stenosis.  2. MRA is negative for other intracranial stenosis. 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  3. MRI reveals patchy left MCA infarcts most confluent at the posterior caudate and lentiform. Overall infarct volume probably similar to that predicted on CTP yesterday.  4. No intracranial mass effect or hemorrhagic transformation.  5. Chronic left corona radiata infarct.   IR Intra Cran Stent 10/04/2019 IMPRESSION:  Status post endovascular  revascularization of occluded left middle cerebral artery M1 segment, with 2 passes with the Solitaire 4 mm x 40 mm X retrieval device, 1 pass with the 6 mm x 40 mm Solitaire X retrieval device, with Penumbra aspiration, and placement of a rescue stents in the superior and inferior divisions of the left middle cerebral artery achieving a TICI 2b to TICI 2c revascularization.  PLAN: Follow-up in the clinic 4 weeks post discharge.   DG Chest Port 1 View 10/05/2019 IMPRESSION:  No acute cardiopulmonary findings.  DG Chest Port 1 View 10/03/2019 IMPRESSION:  1. Advance the enteric tube 5 cm to ensure side hole placement within the stomach.  2. Endotracheal tube tip in good position.  3.  No acute cardiopulmonary abnormality.   DG Abd Portable 1V 10/03/2019 IMPRESSION:  1. NG tube in good position.  2. Contrast surrounding the right kidney suggesting a urinary leak or obstruction. Recommend clinical correlation. CT abdomen/pelvis may be helpful for further evaluation.   Abdominal Pelvis CT W & WO Contrast 10/07/19 IMPRESSION: 1. Slightly asymmetric fat and fluid stranding about the right kidney, however there is no evidence of urinary tract calculus or hydronephrosis.  There is no evidence of contrast leak on delayed phase imaging. Leak suspected on prior radiographs may be resolved in the interval. 2. Small right pleural effusion. 3. Aortic atherosclerosis (ICD10-I70.0).  DG Swallowing Func-Speech Pathology 10/05/2019 Objective Swallowing Evaluation: Type of Study: MBS-Modified Barium Swallow Study  Patient Details Name: Erin Peck MRN: [id number] Date of Birth: September 08, 1965 Today's Date: 10/05/2019 Time: SLP Start Time (ACUTE ONLY): 1323 -SLP Stop Time (ACUTE ONLY): 1334 SLP Time Calculation (min) (ACUTE ONLY): 11 min Past Medical History: No past medical history on file. Past Surgical History: Past Surgical History: Procedure Laterality Date . IR CT HEAD LTD  10/03/2019 . IR INTRA CRAN STENT  10/03/2019 . IR PATIENT EVAL TECH 0-60 MINS  10/03/2019 . IR PERCUTANEOUS ART THROMBECTOMY/INFUSION INTRACRANIAL INC DIAG ANGIO  10/03/2019 . RADIOLOGY WITH ANESTHESIA N/A 10/02/2019  Procedure: IR WITH ANESTHESIA;  Surgeon: Luanne Bras, MD;  Location: [facility];  Service: Radiology;  Laterality: N/A; HPI: 54 year old female with acute Left MCA M1 occlusion s/p tPA and mechanical thrombectomy returning to ICU. Intubated 3/8-3/10.  Subjective: alert, pleasant, not very communicative Assessment / Plan / Recommendation CHL IP CLINICAL IMPRESSIONS 10/05/2019 Clinical Impression Pt demonstrates a primary oral dysphagia with mild right anterior labial weakness and decreased lingual strength. Pt has mild anterior bolus loss with thin via cup, better containment with a straw. Pt also has mild residue on the lingual body that spills to pharynx post swallow, resulting in some instances of trace spillage to pyrifomr sinuses or even very trace penetration, all sensed and cleared with a second swallow. Pt able to masticate soft solids and again clears residue with a second swallow. Recommend dys 3 mech soft given UE weakness and potential difficulty cutting and preparing foods. Thin liquids. SLP will  f/u for tolerance.  SLP Visit Diagnosis Dysphagia, unspecified (R13.10) Attention and concentration deficit following -- Frontal lobe and executive function deficit following -- Impact on safety and function Mild aspiration risk   CHL IP TREATMENT RECOMMENDATION 10/05/2019 Treatment Recommendations Therapy as outlined in treatment plan below   Prognosis 10/04/2019 Prognosis for Safe Diet Advancement Good Barriers to Reach Goals -- Barriers/Prognosis Comment -- CHL IP DIET RECOMMENDATION 10/05/2019 SLP Diet Recommendations Dysphagia 3 (Mech soft) solids;Thin liquid Liquid Administration via Straw Medication Administration Whole meds with puree Compensations Slow rate;Small sips/bites  Postural Changes Remain semi-upright after after feeds/meals (Comment);Seated upright at 90 degrees   CHL IP OTHER RECOMMENDATIONS 10/05/2019 Recommended Consults -- Oral Care Recommendations Oral care BID Other Recommendations --   CHL IP FOLLOW UP RECOMMENDATIONS 10/05/2019 Follow up Recommendations Inpatient Rehab   CHL IP FREQUENCY AND DURATION 10/05/2019 Speech Therapy Frequency (ACUTE ONLY) min 2x/week Treatment Duration --      CHL IP ORAL PHASE 10/05/2019 Oral Phase Impaired Oral - Pudding Teaspoon -- Oral - Pudding Cup -- Oral - Honey Teaspoon -- Oral - Honey Cup -- Oral - Nectar Teaspoon -- Oral - Nectar Cup -- Oral - Nectar Straw -- Oral - Thin Teaspoon -- Oral - Thin Cup Right anterior bolus loss Oral - Thin Straw Weak lingual manipulation;Lingual/palatal residue Oral - Puree Lingual/palatal residue Oral - Mech Soft -- Oral - Regular Lingual/palatal residue Oral - Multi-Consistency -- Oral - Pill Decreased bolus cohesion Oral Phase - Comment --  CHL IP PHARYNGEAL PHASE 10/05/2019 Pharyngeal Phase WFL Pharyngeal- Pudding Teaspoon -- Pharyngeal -- Pharyngeal- Pudding Cup -- Pharyngeal -- Pharyngeal- Honey Teaspoon -- Pharyngeal -- Pharyngeal- Honey Cup -- Pharyngeal -- Pharyngeal- Nectar Teaspoon -- Pharyngeal -- Pharyngeal- Nectar Cup  -- Pharyngeal -- Pharyngeal- Nectar Straw -- Pharyngeal -- Pharyngeal- Thin Teaspoon -- Pharyngeal -- Pharyngeal- Thin Cup -- Pharyngeal -- Pharyngeal- Thin Straw -- Pharyngeal -- Pharyngeal- Puree -- Pharyngeal -- Pharyngeal- Mechanical Soft -- Pharyngeal -- Pharyngeal- Regular -- Pharyngeal -- Pharyngeal- Multi-consistency -- Pharyngeal -- Pharyngeal- Pill -- Pharyngeal -- Pharyngeal Comment --  No flowsheet data found. Erin Ditty, MA CCC-SLP Acute Rehabilitation Services Pager [phone] Office [phone] Claudine Mouton 10/05/2019, 1:42 PM              IR PATIENT EVAL TECH 0-60 MINS 10/03/2019  2:34 PM  Right 8 french sheath removal, no hematoma present prior to sheath removal.  Held manual pressure with a Quikclot pad for 25 minutes.  No complications and distal pulses still present.  Verified site with RN and gave instructions on when to remove Quikclot pad. Carlyon Prows      ECHOCARDIOGRAM COMPLETE 10/03/2019 IMPRESSIONS   1. Left ventricular ejection fraction, by estimation, is 60 to 65%. The left ventricle has normal function. The left ventricle has no regional wall motion abnormalities. Left ventricular diastolic parameters were normal.   2. Right ventricular systolic function is normal. The right ventricular size is normal.   3. The mitral valve is normal in structure. No evidence of mitral valve regurgitation. No evidence of mitral stenosis.   4. The aortic valve is normal in structure. Aortic valve regurgitation is not visualized. No aortic stenosis is present.   5. The inferior vena cava is normal in size with greater than 50% respiratory variability, suggesting right atrial pressure of 3 mmHg. Comparison(s): No prior Echocardiogram.   CT HEAD CODE STROKE WO CONTRAST 10/02/2019 IMPRESSION:  1. No acute cortically based infarct or acute intracranial hemorrhage identified. ASPECTS 10.  2. Evidence of small vessel disease in the left corona radiata, age indeterminate although  favor chronic.   VAS [place] LOWER EXTREMITY VENOUS (DVT) 10/04/2019 Summary:  BILATERAL: - No evidence of deep vein thrombosis seen in the lower extremities, bilaterally.   RIGHT: - No cystic structure found in the popliteal fossa.   LEFT: - No cystic structure found in the popliteal fossa.  Final      TEE 10/06/2019 IMPRESSIONS  1. Left ventricular ejection fraction, by estimation, is 60 to 65%. The  left ventricle has  normal function. The left ventricle has no regional  wall motion abnormalities.  2. Right ventricular systolic function is normal. The right ventricular  size is normal.  3. No left atrial/left atrial appendage thrombus was detected.  4. The mitral valve is normal in structure. Trivial mitral valve  regurgitation.  5. The aortic valve is normal in structure. Aortic valve regurgitation is  not visualized.  6. Trivial circumferential pericardial effusion.      HISTORY OF PRESENT ILLNESS Erin Peck is a 54 y.o. female with known past medical history, presenting to [facility] for symptoms consistent with a left hemispheric stroke-right-sided weakness and speech deficits. Evaluated by telemedicine neurology emergently and deemed to be a candidate for IV TPA due to last known normal being within 4-1/2 hours-last known well at 1750 hrs. on 10/02/2019. Further imaging obtained-CTA head and neck revealed a left M1 occlusion/significant stenosis, and a high NIH, makes her a candidate for intervention. Discussed with the telemedicine neurologist over the phone and accepted patient directly to come to the neuro interventional radiology suite. IR code stroke activated prior to patient's arrival to [facility].  Anesthesia team and IR team ready to receive the patient upon arrival at [facility].  Spoke with the family members who arrived at the hospital later at around 10:30 PM. The report that she has not seen a doctor in many many years and has no known past medical  history but that is mostly because of not having seen a doctor.  She at baseline is able to carry all her ADLs independently.  She was seen last year for some focal neurological symptoms that were deemed to be secondary to a complex migraine.  Has not had the symptoms again up until today symptoms of the paralysis and speech difficulties. Premorbid modified Rankin scale (mRS): 0   HOSPITAL COURSE Erin Peck is a 54 y.o. female with history of complex migraines who does not often see a doctor presenting to [facility] ED with R sided hemiparesis and speech deficits. Received tPA 10/03/2019 at 2016 at [facility]. Transferred to [facility] for IR.   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 Chronic lacune L corona radiata. ASPECTS 10.     CTA head & neck ELVO L M1/L MCA bifurcation. Proximal R ICA 50% stenosis. R VA origin 50% stenosis. L VA hypoplastic/occlusion. emphysema  CT perfusion positive penumbra. Mismatch 75cc.  Cerebral angio L M1 occlusion w/ TICI2c revascularization w/ rescue Y stenting into superior and inferior divisions; superior division stent occlusion w/ revascularization using integrelin. Later treated w/ Aggrastat. Sheath out 3/9  MRI  Patchy L MCA infarcts posterior caudate and lentiform. Old L corona radiata infarct  MRA   L MCA stent w/ decreased L M1 flow and poor visualization of branches, possible residual L MCA stenosis. 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  CTA head repeat - left M2 superior branch nonocclusive in-stent thrombusis with minimal distal flow beyond stent, however, collateral flow adequate.   LE Doppler  No DVT  2D Echo EF 60-65%. No source of embolus   TEE - 10/06/19 - EF 60 - 65%. No cardiac source of emboli identified. Trivial circumferential pericardial effusion.   EP  cardiology consulted. Will consider placement of an implantable loop recorder as an OP once has  insurance coverage  P2Y12 3/11 = 39  LDL 152  HgbA1c 5.3  SCDs for VTE prophylaxis  No antithrombotic prior to admission, aspirin 81 mg daily and Brilinta (ticagrelor) 90 mg bid changed to ASA and Plavix per Dr Roda Shutters.  Therapy recommendations: CIR - pt declined - will go home with OP therapy, DME  Disposition:  home  Acute Hypoxic Respiratory Failure  Intubated for IR, left intubated following IR  Extubation 3/10 to RA  CCM signed off  Hypotension Hypertensive Emergency  Home meds:  No hx HTN, not on home meds  BP as high as 217/110 on arrival  Treated with Cleviprex post IR, now off   Permissive hypertension given left M2 minimal flow beyond stent.  3/10 BP down to 90-100s w/ confusion. Bolus w/ IVF  Added midodrine 5 mg tid  Albumin q6h x 4  BP now 140s, stable  BP goal 120-160 for now  Hyperlipidemia  Home meds:  No statin  LDL 152, goal < 70  Lipitor 80 added  Continue statin at discharge  Dysphagia  Secondary to stroke  NPO x meds crushed in puree w/ full supervision  Speech on board  MBS cleared for D3 thin liquid diet  Hypoglycemia  Glucose 88  Put on D5 normal saline at 75  CBG every 4 hours  CBGs low 100s  MBS cleared for D3 thin liquid diet  Other Stroke Risk Factors  Family hx stroke (mother and father, both in their 31s)  Hx complex Migraines  Other Active Problems  Leukocytosis 14.1->11.4->15.0->11.1->10.4->9.1, afebrile - resolved  Acute blood loss anemia 13.8->10.5->9.2->9.8->10.1  Hypokalemia 3.3- supplemented - 3.3->4.3  Abdominal X-ray 10/03/19 - Contrast surrounding the right kidney suggesting a urinary leak or obstruction. CT abdomen/pelvis W & WO  -> no leak (see above)  Amantadine - 100mg  daily as recommended by PMR for aphasia recovery.  Hypotension - midodrine 5 mg tid   DISCHARGE EXAM Blood  pressure 120/87, pulse 70, temperature 97.7 F (36.5 C), temperature source Oral, resp. rate 16, height 5\' 5"  (1.651 m), weight 74.1 kg, SpO2 98 %. General - Well nourished, well developed, not in acute distress.  Ophthalmologic - fundi not visualized due to noncooperation.  Cardiovascular - Regular rate and rhythm.  Neuro - awake alert, extubated yesterday, tolerating well, eyes open spontaneously, following all simple commands.  PERRL, EOMI, visual field full.  Right mild facial droop.  Tongue protrusion midline. Spontaneous moving LUE, about 4/5. RUE 3/5. LLE at least 3/5 proximal and 4+/5 distally. RLE proximal 3/5 proximal and 3/5 distall DF/PF. DTR 1+ and no babinski. Sensation symmetrical subjectively, coordination intact left finger-to-nose and gait not tested.  Discharge Diet  Dysphagia 3 thin liquids  DISCHARGE PLAN  Disposition:  Return home  PT, OT and ST outpatient. DME ordered   Aspirin 325 mg and Plavix 75 mg daily for secondary stroke prevention   Recommend ongoing stroke risk factor control by Primary Care Physician at time of discharge from inpatient rehabilitation.  Follow-up PCP in 2 weeks following discharge from rehab, pt instructed to get a PCP  Follow-up in [facility] in 4 weeks following discharge from rehab, office to schedule an appointment.   Follow-up Sanjeev ) , MD (Interventional Neuroradiologist) in 4 weeks following discharge from rehab, office to schedule an appointment.   Follow-up EP cardiology NP Alinda Money, 11/09/2019 at 12n to discuss loop recorder placement  40 minutes were spent preparing discharge.  Gypsy Balsam PA-C [facility] Pager [phone] 10/07/2019, 4:06 PM

## 2019-10-06 NOTE — Progress Notes (Signed)
  Speech Language Pathology Treatment: Dysphagia  Patient Details Name: Erin Peck MRN: [id number] DOB: 1966/07/25 Today's Date: 10/06/2019 Time: 8563-1497 SLP Time Calculation (min) (ACUTE ONLY): 10 min  Assessment / Plan / Recommendation Clinical Impression  Pt encountered at bedside for skilled ST targeting swallow function. Today's session focused on patient education. Patient educated re: results and recommendations from her participation in MBSS yesterday. ST verbally educated patient re: results and shared video clips of imaging with patient to aid with her understanding. Pt nodded, verbalized understanding and had all questions answered. Recommendations for patient to have thin liquids, dysphagia 3 solids. Recommend use of a straw with thin liquids. Patient not seen with PO as she is NPO for a procedure. ST to follow as per POC.   HPI HPI: 54 year old female with acute Left MCA M1 occlusion s/p tPA and mechanical thrombectomy returning to ICU. Intubated 3/8-3/10.      SLP Plan  Continue with current plan of care       Recommendations  Diet recommendations: Dysphagia 3 (mechanical soft);Thin liquid Medication Administration: Crushed with puree Supervision: Intermittent supervision to cue for compensatory strategies                Plan: Continue with current plan of care       GO                Shanyn Preisler 10/06/2019, 10:12 AM  Shella Spearing, M.Ed., CCC-SLP Speech Therapy Acute Rehabilitation [phone]: Acute Rehab office [phone] - pager

## 2019-10-06 NOTE — TOC Progression Note (Signed)
Transition of Care [facility]) - Progression Note    Patient Details  Name: Erin Peck MRN: [id number] Date of Birth: [birth year]  Transition of Care [facility]) CM/SW Contact  Pollie Friar, RN Phone Number: 10/06/2019, 3:51 PM  Clinical Narrative:    Pt and family refusing CiR. Plan is for pt to d/c home with outpatient therapy. CM met with pt and family and they are in agreement with attending [facility]. Order in [place] and information on the AVS.  Walker to be delivered to the room per [employer].  Pt has support at home and transportation to home when medically ready.    Expected Discharge Plan: OP Rehab Barriers to Discharge: Continued Medical Work up, Inadequate or no insurance  Expected Discharge Plan and Services Expected Discharge Plan: OP Rehab   Discharge Planning Services: CM Consult Post Acute Care Choice: Durable Medical Equipment Living arrangements for the past 2 months: Single Family Home                                       Social Determinants of Health (SDOH) Interventions    Readmission Risk Interventions No flowsheet data found.

## 2019-10-06 NOTE — CV Procedure (Addendum)
    PROCEDURE NOTE:  Procedure:  Transesophageal echocardiogram Operator:  Armanda Magic, MD Indications:  CVA Complications: None  During this procedure the patient is administered a total of Versed 4 mg and Fentanyl 50 mg to achieve and maintain moderate conscious sedation.  The patient's heart rate, blood pressure, and oxygen saturation are monitored continuously during the procedure. The period of conscious sedation is 8 minutes, of which I was present face-to-face 100% of this time.  Results: Normal LV size and function Normal RV size and function Normal RA Normal LA Normal TV with trivial TR Normal PV with trivial PR Normal MV with trivial MR Normal trileaflet AV Normal interatrial septum with no evidence of shunt by colorflow dopper or microcavitation saline contrast Normal thoracic and ascending aorta.  The patient tolerated the procedure well and was transferred back to their room in stable condition.  Signed: Armanda Magic, MD [facility]

## 2019-10-06 NOTE — Progress Notes (Signed)
  Echocardiogram Echocardiogram Transesophageal has been performed.  Burnard Hawthorne 10/06/2019, 2:48 PM

## 2019-10-06 NOTE — Progress Notes (Signed)
Referring Physician(s): Code Stroke- Wilford Corner, Ashish  Supervising Physician: Julieanne Cotton  Patient Status:  [facility] - In-pt  Chief Complaint: None  Subjective:  History of acute CVA s/p cerebral arteriogram with emergent mechanical thrombectomy of left MCA M1 occlusion, along with rescue Y stenting of left MCA M1 superior and inferior division reocclusion achieving a TICI 2C revascularization 10/02/2019 by Dr. Corliss Skains. Patient awake and alert laying in bed with no complaints at this time. Affect appears flat. Follows simple commands. Can spontaneously move all extremities.   Allergies: Patient has no known allergies.  Medications: Prior to Admission medications   Medication Sig Start Date End Date Taking? Authorizing Provider  ibuprofen (ADVIL) 200 MG tablet Take 200 mg by mouth every 6 (six) hours as needed for mild pain or moderate pain.   Yes [provider]     Vital Signs: BP (!) 141/72 (BP Location: Left Arm)   Pulse 77   Temp (!) 97.5 F (36.4 C) (Oral)   Resp 17   Ht 5\' 5"  (1.651 m)   Wt 163 lb 5.8 oz (74.1 kg)   SpO2 99%   BMI 27.18 kg/m   Physical Exam Vitals and nursing note reviewed.  Constitutional:      General: She is not in acute distress.    Appearance: Normal appearance.  Pulmonary:     Effort: Pulmonary effort is normal. No respiratory distress.  Skin:    General: Skin is warm and dry.  Neurological:     Mental Status: She is alert.     Comments: Alert, awake, and oriented x3. Follows simple commands. Can spontaneously move all extremities. No pronator drift. Fine motor and coordination intact, slightly slower with right hand.  Psychiatric:     Comments: Flat affect.     Imaging: CT ANGIO HEAD W OR WO CONTRAST  Result Date: 10/04/2019 CLINICAL DATA:  Stroke follow-up EXAM: CT ANGIOGRAPHY HEAD TECHNIQUE: Multidetector CT imaging of the head was performed using the standard protocol during bolus administration of  intravenous contrast. Multiplanar CT image reconstructions and MIPs were obtained to evaluate the vascular anatomy. CONTRAST:  75mL OMNIPAQUE IOHEXOL 350 MG/ML SOLN COMPARISON:  Brain MRI from yesterday FINDINGS: CT HEAD Brain: Cytotoxic edema from infarct in the left basal ganglia and adjacent white matter, unchanged in extent from prior brain MRI. There is also pre-existing lacunar infarct in the left frontal white matter. No hemorrhagic conversion or midline shift. Vascular: See below Skull: Negative Sinuses: Patchy chronic appearing sinusitis Orbits: Negative CTA HEAD Anterior circulation: The siphons are symmetrically patent. Y stent at the left M1 to M2 segments with nonocclusive thrombus seen in the lumen of the upper limb. The lower limb is well enhancing. Limited visualization at the margins of the stent due to artifact from radiopaque markers. Hypoplastic right A1 segment. Negative for aneurysm or right-sided embolic disease Posterior circulation: Right dominant vertebral artery. Basilar is smooth and widely patent. No branch occlusion or beading. Negative for aneurysm. Venous sinuses: Diffusely patent Anatomic variants: None significant Call has been placed to the Neurology team. IMPRESSION: 1. Y stenting of the left M1 and M2 segments with nonocclusive in stent thrombus narrowing the upper limb. 2. Acute left MCA territory infarct is unchanged from brain MRI yesterday. Electronically Signed   By: Marnee Spring M.D.   On: 10/04/2019 08:21   CT ANGIO HEAD W OR WO CONTRAST  Result Date: 10/02/2019 CLINICAL DATA:  Initial evaluation for acute headache, right-sided weakness. EXAM: CT ANGIOGRAPHY HEAD AND  NECK CT PERFUSION BRAIN TECHNIQUE: Multidetector CT imaging of the head and neck was performed using the standard protocol during bolus administration of intravenous contrast. Multiplanar CT image reconstructions and MIPs were obtained to evaluate the vascular anatomy. Carotid stenosis measurements (when  applicable) are obtained utilizing NASCET criteria, using the distal internal carotid diameter as the denominator. Multiphase CT imaging of the brain was performed following IV bolus contrast injection. Subsequent parametric perfusion maps were calculated using RAPID software. CONTRAST:  OMNIPAQUE IOHEXOL 350 MG/ML SOLN COMPARISON:  Prior head CT from earlier same day. FINDINGS: CTA NECK FINDINGS Aortic arch: Visualized aortic arch normal in caliber. Bovine arch with common origin of the right brachiocephalic and left common carotid artery noted. Mild-to-moderate atheromatous plaque about the arch and origin of the great vessels without hemodynamically significant stenosis. Visualized subclavian arteries widely patent. Right carotid system: Right common carotid artery patent from its origin to the bifurcation without stenosis. Scattered eccentric calcified plaque about the right bifurcation/proximal right ICA with stenosis of up to approximately 50% by NASCET criteria. Right ICA otherwise widely patent distally to the skull base without stenosis, dissection, or occlusion. Left carotid system: Calcified plaque at the origin of the left CCA without high-grade stenosis. Left CCA otherwise patent to the bifurcation without stenosis. Mild scattered calcified plaque about the left bifurcation/proximal left ICA without significant stenosis. Left ICA widely patent distally to the skull base without stenosis, dissection or occlusion. Vertebral arteries: Both vertebral arteries arise from the subclavian arteries. Right vertebral artery dominant with a diffusely hypoplastic left vertebral artery. Focal atheromatous plaque at the origin of the right vertebral artery with approximate 50% stenosis. Right vertebral artery otherwise widely patent within the neck. 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 Distal reconstitution via muscular branches at the left V3 segment (series 7, image 158). Left vertebral is  patent as it courses into the cranial vault. Skeleton: No acute osseous abnormality. No discrete or worrisome osseous lesions. Other neck: No other acute soft tissue abnormality within the neck. No adenopathy. Subcentimeter hypodense nodule noted within the right thyroid lobe, of doubtful significance given size and patient age. No follow-up imaging recommended regarding this lesion. Upper chest: Visualized upper chest demonstrates no acute finding. Partially visualized lungs are clear. Paraseptal and centrilobular emphysematous changes noted at the lung apices. Review of the MIP images confirms the above findings CTA HEAD FINDINGS Anterior circulation: Petrous segments widely patent bilaterally. Cavernous and supraclinoid ICAs patent without flow-limiting stenosis. A1 segments patent bilaterally. Right A1 hypoplastic, accounting for the slightly diminutive right ICA is compared to the left. Normal anterior communicating artery. Anterior cerebral arteries widely patent to their distal aspects. Right M1 widely patent. Normal right MCA bifurcation. Distal right MCA branches well perfused. Left M1 patent proximally. There is occlusive thrombus within the distal left M1 segment/left MCA bifurcation (series 7, image 94). Fairly good perfusion seen within the left MCA branches distally, which could be related to subocclusive thrombus and/or collateralization. Posterior circulation: Vertebral arteries patent to the vertebrobasilar junction without stenosis. Neither PICA of visualized. Basilar widely patent to its distal aspect without stenosis. Superior cerebral arteries patent bilaterally. Both PCAs primarily supplied via the basilar and are well perfused or distal aspects. Small hypoplastic posterior communicating arteries noted bilaterally. Venous sinuses: Patent. Anatomic variants: None significant.  No aneurysm. Review of the MIP images confirms the above findings CT Brain Perfusion Findings: ASPECTS: 10 CBF (<30%)  Volume: 19mL Perfusion (Tmax>6.0s) volume: 94mL Mismatch Volume: 75mL Infarction Location:Patchy acute  core infarct seen involving the cortical and subcortical aspect of the mid and posterior left frontal region. Moderate sized surrounding ischemic penumbra. IMPRESSION: CTA HEAD AND NECK IMPRESSION: 1. Positive CTA for emergent large vessel occlusion, with occlusive thrombus involving the distal left M1 segment/left MCA bifurcation. Fairly robust perfusion within the distal left MCA branches distally, could be related to subocclusive thrombus and/or collateralization. 2. 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 3. 50% stenosis at the origin of the dominant right vertebral artery. 4. Hypoplastic left vertebral artery, occluded within the neck, with distal reconstitution by the skull base. 5.  Emphysema (ICD10-J43.9). CT PERFUSION IMPRESSION: 19 cc acute core infarct involving the mid and posterior left frontal region, left MCA distribution. 94 cc surrounding ischemic penumbra, mismatch volume 75 cc. Critical Value/emergent results were called by telephone at the time of interpretation on 10/02/2019 at 9:05 pm to provider Dr. Deretha Emory, who verbally acknowledged these results. Electronically Signed   By: Rise Mu M.D.   On: 10/02/2019 21:31   CT ANGIO NECK W OR WO CONTRAST  Result Date: 10/02/2019 CLINICAL DATA:  Initial evaluation for acute headache, right-sided weakness. EXAM: CT ANGIOGRAPHY HEAD AND NECK CT PERFUSION BRAIN TECHNIQUE: Multidetector CT imaging of the head and neck was performed using the standard protocol during bolus administration of intravenous contrast. Multiplanar CT image reconstructions and MIPs were obtained to evaluate the vascular anatomy. Carotid stenosis measurements (when applicable) are obtained utilizing NASCET criteria, using the distal internal carotid diameter as the denominator. Multiphase CT imaging of the brain was performed following IV bolus  contrast injection. Subsequent parametric perfusion maps were calculated using RAPID software. CONTRAST:  OMNIPAQUE IOHEXOL 350 MG/ML SOLN COMPARISON:  Prior head CT from earlier same day. FINDINGS: CTA NECK FINDINGS Aortic arch: Visualized aortic arch normal in caliber. Bovine arch with common origin of the right brachiocephalic and left common carotid artery noted. Mild-to-moderate atheromatous plaque about the arch and origin of the great vessels without hemodynamically significant stenosis. Visualized subclavian arteries widely patent. Right carotid system: Right common carotid artery patent from its origin to the bifurcation without stenosis. Scattered eccentric calcified plaque about the right bifurcation/proximal right ICA with stenosis of up to approximately 50% by NASCET criteria. Right ICA otherwise widely patent distally to the skull base without stenosis, dissection, or occlusion. Left carotid system: Calcified plaque at the origin of the left CCA without high-grade stenosis. Left CCA otherwise patent to the bifurcation without stenosis. Mild scattered calcified plaque about the left bifurcation/proximal left ICA without significant stenosis. Left ICA widely patent distally to the skull base without stenosis, dissection or occlusion. Vertebral arteries: Both vertebral arteries arise from the subclavian arteries. Right vertebral artery dominant with a diffusely hypoplastic left vertebral artery. Focal atheromatous plaque at the origin of the right vertebral artery with approximate 50% stenosis. Right vertebral artery otherwise widely patent within the neck. 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 Distal reconstitution via muscular branches at the left V3 segment (series 7, image 158). Left vertebral is patent as it courses into the cranial vault. Skeleton: No acute osseous abnormality. No discrete or worrisome osseous lesions. Other neck: No other acute soft tissue abnormality  within the neck. No adenopathy. Subcentimeter hypodense nodule noted within the right thyroid lobe, of doubtful significance given size and patient age. No follow-up imaging recommended regarding this lesion. Upper chest: Visualized upper chest demonstrates no acute finding. Partially visualized lungs are clear. Paraseptal and centrilobular emphysematous changes noted at the lung apices.  Review of the MIP images confirms the above findings CTA HEAD FINDINGS Anterior circulation: Petrous segments widely patent bilaterally. Cavernous and supraclinoid ICAs patent without flow-limiting stenosis. A1 segments patent bilaterally. Right A1 hypoplastic, accounting for the slightly diminutive right ICA is compared to the left. Normal anterior communicating artery. Anterior cerebral arteries widely patent to their distal aspects. Right M1 widely patent. Normal right MCA bifurcation. Distal right MCA branches well perfused. Left M1 patent proximally. There is occlusive thrombus within the distal left M1 segment/left MCA bifurcation (series 7, image 94). Fairly good perfusion seen within the left MCA branches distally, which could be related to subocclusive thrombus and/or collateralization. Posterior circulation: Vertebral arteries patent to the vertebrobasilar junction without stenosis. Neither PICA of visualized. Basilar widely patent to its distal aspect without stenosis. Superior cerebral arteries patent bilaterally. Both PCAs primarily supplied via the basilar and are well perfused or distal aspects. Small hypoplastic posterior communicating arteries noted bilaterally. Venous sinuses: Patent. Anatomic variants: None significant.  No aneurysm. Review of the MIP images confirms the above findings CT Brain Perfusion Findings: ASPECTS: 10 CBF (<30%) Volume: 19mL Perfusion (Tmax>6.0s) volume: 94mL Mismatch Volume: 75mL Infarction Location:Patchy acute core infarct seen involving the cortical and subcortical aspect of the mid  and posterior left frontal region. Moderate sized surrounding ischemic penumbra. IMPRESSION: CTA HEAD AND NECK IMPRESSION: 1. Positive CTA for emergent large vessel occlusion, with occlusive thrombus involving the distal left M1 segment/left MCA bifurcation. Fairly robust perfusion within the distal left MCA branches distally, could be related to subocclusive thrombus and/or collateralization. 2. 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 3. 50% stenosis at the origin of the dominant right vertebral artery. 4. Hypoplastic left vertebral artery, occluded within the neck, with distal reconstitution by the skull base. 5.  Emphysema (ICD10-J43.9). CT PERFUSION IMPRESSION: 19 cc acute core infarct involving the mid and posterior left frontal region, left MCA distribution. 94 cc surrounding ischemic penumbra, mismatch volume 75 cc. Critical Value/emergent results were called by telephone at the time of interpretation on 10/02/2019 at 9:05 pm to provider Dr. Deretha Emory, who verbally acknowledged these results. Electronically Signed   By: Rise Mu M.D.   On: 10/02/2019 21:31   MR ANGIO HEAD WO CONTRAST  Addendum Date: 10/03/2019   ADDENDUM REPORT: 10/03/2019 23:31 ADDENDUM: Left MCA findings discussed by telephone with Dr. Milon Dikes on 10/03/2019 at 2320 hours. And we discussed that a repeat CTA may be valuable to further quantify the Left MCA reperfusion. Electronically Signed   By: Odessa Fleming M.D.   On: 10/03/2019 23:31   Result Date: 10/03/2019 CLINICAL DATA:  54 year old female status post emergent large vessel occlusion, left M1 thrombus treated with endovascular reperfusion with rescue stenting. EXAM: MRI HEAD WITHOUT CONTRAST MRA HEAD WITHOUT CONTRAST TECHNIQUE: Multiplanar, multiecho pulse sequences of the brain and surrounding structures were obtained without intravenous contrast. Angiographic images of the head were obtained using MRA technique without contrast. COMPARISON:  CTA head  and neck, CT Perfusion yesterday. FINDINGS: MRI HEAD FINDINGS Brain: Left MCA territory restricted diffusion, most confluent at the posterior left caudate and left lentiform, with additional scattered small cortical and subcortical areas around the left insula and left inferior frontal gyrus. This is similar to although not exactly corresponding to the predicted core infarct on CTP (19 mL on that exam). Associated T2 and FLAIR hyperintensity with no convincing hemorrhagic transformation. No mass effect. No contralateral right hemisphere or posterior fossa restricted diffusion. Scattered bilateral small and mostly subcortical white matter  T2 and FLAIR hyperintense foci. 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 No midline shift, mass effect, evidence of mass lesion, ventriculomegaly, extra-axial collection or acute intracranial hemorrhage. Cervicomedullary junction and pituitary are within normal limits. Vascular: Major intracranial vascular flow voids are preserved. Skull and upper cervical spine: Negative visible cervical spine. Visualized bone marrow signal is within normal limits. Sinuses/Orbits: Negative orbits. Posterior ethmoid and sphenoid sinus mucosal thickening. Other: Layering fluid in the pharynx. Mastoids remain clear. The patient is intubated with an oral enteric tube also partially visible. MRA HEAD FINDINGS Antegrade flow in the posterior circulation with dominant right vertebral artery. Patent left vertebrobasilar junction. Normal right PICA origin. Patent basilar artery. Patent AICA, SCA and PCA origins. Both posterior communicating arteries are present. Bilateral PCA branches are within normal limits. Antegrade flow in both ICA siphons. No siphon stenosis identified. Ophthalmic and posterior communicating artery origins appear normal. Patent carotid termini. Patent MCA and ACA origins. The left ACA appears dominant. And  there is a 2 mm infundibulum versus aneurysm directed superiorly from the anterior communicating artery region on series 2, image 93. But otherwise the visible ACA branches are within normal limits. Right MCA M1 segment, right MCA bifurcation and visible right MCA branches are within normal limits. The left M1 was stented. The left MCA origin is patent, with loss of most of the left MCA flow signal, although there is flow signal detected at the left MCA bifurcation. However, there is a paucity of left MCA branches when compared to the right side. Furthermore, there is asymmetric FLAIR signal within left MCA M2 and M3 branches on the anatomic images above. IMPRESSION: 1. The Left MCA was stented which is at least partially responsible for the decreased left M1 flow signal. However, decreased visualization of left MCA branches on MRA and increased branch FLAIR signal raises the possibility of residual hemodynamically significant MCA stenosis. 2. MRA is negative for other intracranial stenosis. 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 3. MRI reveals patchy left MCA infarcts most confluent at the posterior caudate and lentiform. Overall infarct volume probably similar to that predicted on CTP yesterday. 4. No intracranial mass effect or hemorrhagic transformation. 5. Chronic left corona radiata infarct. Electronically Signed: By: Odessa Fleming M.D. On: 10/03/2019 23:18   MR BRAIN WO CONTRAST  Addendum Date: 10/03/2019   ADDENDUM REPORT: 10/03/2019 23:31 ADDENDUM: Left MCA findings discussed by telephone with Dr. Milon Dikes on 10/03/2019 at 2320 hours. And we discussed that a repeat CTA may be valuable to further quantify the Left MCA reperfusion. Electronically Signed   By: Odessa Fleming M.D.   On: 10/03/2019 23:31   Result Date: 10/03/2019 CLINICAL DATA:  54 year old female status post emergent large vessel occlusion, left M1 thrombus treated with endovascular reperfusion with  rescue stenting. EXAM: MRI HEAD WITHOUT CONTRAST MRA HEAD WITHOUT CONTRAST TECHNIQUE: Multiplanar, multiecho pulse sequences of the brain and surrounding structures were obtained without intravenous contrast. Angiographic images of the head were obtained using MRA technique without contrast. COMPARISON:  CTA head and neck, CT Perfusion yesterday. FINDINGS: MRI HEAD FINDINGS Brain: Left MCA territory restricted diffusion, most confluent at the posterior left caudate and left lentiform, with additional scattered small cortical and subcortical areas around the left insula and left inferior frontal gyrus. This is similar to although not exactly corresponding to the predicted core infarct on CTP (19 mL on that exam). Associated  T2 and FLAIR hyperintensity with no convincing hemorrhagic transformation. No mass effect. No contralateral right hemisphere or posterior fossa restricted diffusion. Scattered bilateral small and mostly subcortical white matter T2 and FLAIR hyperintense foci. 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 No midline shift, mass effect, evidence of mass lesion, ventriculomegaly, extra-axial collection or acute intracranial hemorrhage. Cervicomedullary junction and pituitary are within normal limits. Vascular: Major intracranial vascular flow voids are preserved. Skull and upper cervical spine: Negative visible cervical spine. Visualized bone marrow signal is within normal limits. Sinuses/Orbits: Negative orbits. Posterior ethmoid and sphenoid sinus mucosal thickening. Other: Layering fluid in the pharynx. Mastoids remain clear. The patient is intubated with an oral enteric tube also partially visible. MRA HEAD FINDINGS Antegrade flow in the posterior circulation with dominant right vertebral artery. Patent left vertebrobasilar junction. Normal right PICA origin. Patent basilar artery. Patent AICA, SCA and PCA origins. Both  posterior communicating arteries are present. Bilateral PCA branches are within normal limits. Antegrade flow in both ICA siphons. No siphon stenosis identified. Ophthalmic and posterior communicating artery origins appear normal. Patent carotid termini. Patent MCA and ACA origins. The left ACA appears dominant. And there is a 2 mm infundibulum versus aneurysm directed superiorly from the anterior communicating artery region on series 2, image 93. But otherwise the visible ACA branches are within normal limits. Right MCA M1 segment, right MCA bifurcation and visible right MCA branches are within normal limits. The left M1 was stented. The left MCA origin is patent, with loss of most of the left MCA flow signal, although there is flow signal detected at the left MCA bifurcation. However, there is a paucity of left MCA branches when compared to the right side. Furthermore, there is asymmetric FLAIR signal within left MCA M2 and M3 branches on the anatomic images above. IMPRESSION: 1. The Left MCA was stented which is at least partially responsible for the decreased left M1 flow signal. However, decreased visualization of left MCA branches on MRA and increased branch FLAIR signal raises the possibility of residual hemodynamically significant MCA stenosis. 2. MRA is negative for other intracranial stenosis. 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 3. MRI reveals patchy left MCA infarcts most confluent at the posterior caudate and lentiform. Overall infarct volume probably similar to that predicted on CTP yesterday. 4. No intracranial mass effect or hemorrhagic transformation. 5. Chronic left corona radiata infarct. Electronically Signed: By: Odessa Fleming M.D. On: 10/03/2019 23:18   IR Intra Cran Stent  Result Date: 10/04/2019 INDICATION: New onset aphasia, right-sided paralysis, and left gaze deviation. Occluded left middle cerebral artery M1 segment on CT angiogram of the  head and neck. EXAM: 1. EMERGENT LARGE VESSEL OCCLUSION THROMBOLYSIS (anterior CIRCULATION) 2. Intracranial rescue stent placement COMPARISON:  CT angiogram of the head and neck of October 03, 2019. MEDICATIONS: Ancef 2 g IV antibiotic was administered within 1 hour of the procedure. ANESTHESIA/SEDATION: General anesthesia. CONTRAST:  Isovue 300 approximately 120 mL. FLUOROSCOPY TIME:  Fluoroscopy Time: 76 minutes 36 seconds (3040 mGy). COMPLICATIONS: None immediate. TECHNIQUE: Following a full explanation of the procedure along with the potential associated complications, an informed witnessed consent was obtained patient's spouse. The risks of intracranial hemorrhage of 10%, worsening neurological deficit, ventilator dependency, death and inability to revascularize were all reviewed in detail with the patient's spouse. The patient was then put under general anesthesia by the Department of Anesthesiology at [facility]. The right groin was prepped and draped in the usual sterile fashion. Thereafter using modified Seldinger technique, transfemoral access into the right common femoral artery was obtained without difficulty. Over a 0.035 inch guidewire an 8 French 25 cm Pinnacle sheath was inserted. Through this, and also over a 0.035 inch guidewire a connection of a Berenstein 125 cm catheter inside of an 087 95 cm balloon guide catheter was advanced to the left common carotid artery. The guidewire, and the Berenstein support catheter were then removed. Good aspiration obtained from the hub of the balloon guide catheter. A gentle control arteriogram performed through balloon guide catheter demonstrated no evidence of spasms, dissections or of intraluminal filling defects. FINDINGS: However, the left common carotid arteriogram demonstrates the left external carotid artery and its major branches to be widely patent. The left internal carotid artery at the bulb to the cranial skull base also demonstrates wide  patency. The petrous, the cavernous and the supraclinoid segments are intact. The left anterior cerebral artery opacifies into the capillary and venous phases with prompt cross-filling of the right anterior cerebral A2 segment from the left internal carotid artery injection. The left middle cerebral artery demonstrates complete occlusion in its mid M1 segment. The delayed arterial phase demonstrates partial retrograde opacification of the anterior to middle perisylvian branches. PROCEDURE: Over a 0.014 inch standard Synchro micro guidewire with a J configuration the combination of a Trevo ProVue 021 microcatheter inside of a 6 French 132 cm Catalyst guide catheter was advanced using biplane roadmap technique and constant fluoroscopic guidance to the supraclinoid left ICA. The left middle cerebral artery was then entered with the micro guidewire and into the superior division of the left middle cerebral artery followed by the microcatheter. The guidewire was removed. Good aspiration obtained from the hub of the microcatheter. A gentle control arteriogram performed through this demonstrated safe position of the tip of the microcatheter which was then connected to continuous heparinized saline infusion. The 6 [place] Catalyst guide catheter was then advanced into the distal left middle cerebral artery with near complete occlusion of the aspiration with a Penumbra aspiration device for approximately 2-1/2 minutes. A 4 mm x 40 mm Solitaire X retrieval device was then advanced to the distal end of the microcatheter and deployed with slight forward gentle traction with the right hand on the delivery micro guidewire, and retrieving the delivery microcatheter. Proximal flow arrest was then established by inflating the balloon of the balloon guide catheter in the mid left internal carotid artery. With constant aspiration applied at the hub of the 6 [place] Catalyst guide catheter, and with a 60 mL syringe at the hub of the  balloon guide catheter, the combination of the retrieval device, and the microcatheter and the 6 [place] Catalyst catheter were retrieved and removed. Flow arrest was then reversed. A control arteriogram performed through the balloon guide catheter in the left internal carotid artery continued to demonstrate occluded left middle cerebral M1 segment. No evidence of clot was seen in the aspirate or entangled in the retrieval device. A second pass was then made again using the above combination. At this time access with the micro guidewire and the microcatheter was obtained in the inferior division of the left middle cerebral artery M2 M3 region where the microcatheter was positioned. The guidewire was removed. Good aspiration obtained from the hub of the microcatheter. A gentle control arteriogram performed through the microcatheter demonstrates safe position of the tip of the microcatheter. A 6 mm x 40  mm Solitaire X retrieval device was then advanced to the distal end of the microcatheter. The O ring on the delivery microcatheter was loosened. With slight forward gentle traction with the right hand on the delivery micro guidewire, with the left hand the delivery microcatheter was retrieved deploying the retrieval device. The 6 [place] Catalyst guide was now engaged again in the occluded left middle cerebral artery distally to near complete occlusion to aspiration with the Penumbra aspiration device over 2-1/2 minutes. With proximal flow arrest and constant aspiration being applied at the hub of the balloon guide catheter in the left internal carotid artery, the combination was retrieved and removed. Again no evidence of a clot was seen in the aspirate or in the retrieval device. A control arteriogram performed through the balloon guide catheter in the left internal carotid artery again demonstrated no change in the occluded left middle cerebral artery M1 segment. A third pass was then made using a large-bore catheter  inside of which was the 021 Trevo microcatheter again advanced as the combination over a 0.14 inch standard Synchro micro guidewire to the supraclinoid left ICA. Again the micro guidewire was then gently manipulated with a torque device and this time advanced into the middle branch of the left MCA trifurcation between the inferior and the superior divisions. This is then followed by the microcatheter into the M2 M3 region. The guidewire was removed. Good aspiration obtained from the hub of the microcatheter. A gentle control arteriogram performed through the microcatheter demonstrated safe position of the tip of the microcatheter which was then connected to continuous heparinized saline infusion. A 4 mm x 40 mm Solitaire X retrieval device was again advanced to the distal end of the microcatheter. This was again deployed in the usual manner. A gentle control arteriogram performed through the 6 French large-bore catheter in the proximal left MCA now demonstrated a TICI 2c revascularization with an overlying severe atherosclerotic stenosis at the origin of the middle branch and extending into the origin of the inferior division. Again with proximal flow arrest in the left internal carotid artery, and constant aspiration with the Penumbra aspirate device with the large-bore catheter embedded in the occluded distal left MCA over 2-1/2 minutes the combination was retrieved and removed. Again no evidence of clot was found in the retrieval device, or aspirate. A control arteriogram performed following flow reversal in the left internal carotid artery demonstrated angiographically occluded left middle cerebral artery. It was felt overlying pathology was related to an underlying atherosclerotic plaque causing significant stenosis. Following discussion with the referring neuro hospitalist, it was decided to proceed with rescue stenting. At this time, the patient was loaded with Brilinta 180 mg, and aspirin 81 mg via an  orogastric tube. A combination of a Headway 17 2 tip microcatheter inside of a 014 inch standard Synchro micro guidewire inside of a large-bore support catheter was then advanced as mentioned earlier to the supraclinoid left ICA. The micro guidewire was then gently manipulated with a torque device and advanced without difficulty into the inferior division of the left MCA trifurcation followed by the microcatheter. The guidewire was removed. Good aspiration obtained from the hub of the Headway microcatheter. A gentle control arteriogram performed through microcatheter demonstrated safe position of the tip of the microcatheter. A 4 mm x 24 mm Atlas stent delivery system was then advanced to the distal end of the microcatheter, the delivery microcatheter was then positioned such that the proximal portion of the landing zone stent would be  just inside the left middle cerebral artery origin. The O ring on the delivery microcatheter was loosened. With slight forward gentle traction with the right hand on the delivery micro guidewire with the left hand the delivery microcatheter was retrieved unsheathing the distal and the proximal portion of the stent in excellent position. The tines of the stent opened completely proximally and distally. A control arteriogram performed through the large bore catheter demonstrated excellent revascularization of the inferior division into M3 M4 branches. The middle branch of the trifurcation remained occluded. The micro guidewire was reintroduced into the microcatheter and advanced without difficulty into the proximal portion of the deployed stent. Using a torque device, access was obtained into the middle branch without difficulty followed by the microcatheter was advanced to the M2 M3 region. The guidewire was removed. Good aspiration obtained from the hub of the microcatheter which was then connected to continuous heparinized saline infusion. Again another 4 mm x 24 mm Atlas Neuroform  stent was advanced to the distal end of the microcatheter. This was again deployed such that the proximal portion of the stent was in the proximal portion of the left MCA. Following deployment, a control arteriogram performed through the large bore catheter in the left middle cerebral artery demonstrated a TICI 2c revascularization. A control arteriogram performed approximately 5 minutes later demonstrated progressive slow flow in the middle branch of the stented segment. This prompted the use of approximately 6 mg of intra-arterial Integrilin for 10 minutes. A control arteriogram performed approximately 10 minutes later demonstrated slow revascularization of this stented branch. It was, therefore, decided to stop at this juncture. The large-bore catheter and the balloon guide catheter were retrieved and removed. A CT of the brain demonstrated no evidence of a gross hemorrhage, mass effect or midline shift. There was dense contrast staining in the basal ganglia region. Following this it was decided to proceed with a slow IV infusion of aggrastat and half the minimal dose run as an infusion over approximately 8-12 hours in order to protect the stented segments of the left middle cerebral artery. Patient was left intubated on account of the procedure, and also patient had received IV tPA and multiple dual antiplatelets. Additionally, the 8 French Pinnacle sheath in the right groin was left and connected to continuous heparinized saline infusion. Prior to the placement of rescue stents at the superior and inferior divisions of the left middle cerebral artery, a CT was performed of the brain on the table. This continued to demonstrate no evidence of a gross hemorrhage, or mass effect or midline shift. Contrast stain was seen in the basal ganglia region. Distal pulses remained palpable in the dorsalis pedis, and the posterior tibial regions bilaterally. The patient was then transferred intubated to the neuro ICU to  continue with post revascularization care. IMPRESSION: Status post endovascular revascularization of occluded left middle cerebral artery M1 segment, with 2 passes with the Solitaire 4 mm x 40 mm X retrieval device, 1 pass with the 6 mm x 40 mm Solitaire X retrieval device, with Penumbra aspiration, and placement of a rescue stents in the superior and inferior divisions of the left middle cerebral artery achieving a TICI 2b to TICI 2c revascularization. PLAN: Follow-up in the clinic 4 weeks post discharge. Electronically Signed   By: Julieanne Cotton M.D.   On: 10/03/2019 09:56   IR CT Head Ltd  Result Date: 10/04/2019 INDICATION: New onset aphasia, right-sided paralysis, and left gaze deviation. 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 EXAM: 1. EMERGENT LARGE VESSEL OCCLUSION THROMBOLYSIS (anterior CIRCULATION) 2. Intracranial rescue stent placement COMPARISON:  CT angiogram of the head and neck of October 03, 2019. MEDICATIONS: Ancef 2 g IV antibiotic was administered within 1 hour of the procedure. ANESTHESIA/SEDATION: General anesthesia. CONTRAST:  Isovue 300 approximately 120 mL. FLUOROSCOPY TIME:  Fluoroscopy Time: 76 minutes 36 seconds (3040 mGy). COMPLICATIONS: None immediate. TECHNIQUE: Following a full explanation of the procedure along with the potential associated complications, an informed witnessed consent was obtained patient's spouse. The risks of intracranial hemorrhage of 10%, worsening neurological deficit, ventilator dependency, death and inability to revascularize were all reviewed in detail with the patient's spouse. The patient was then put under general anesthesia by the Department of Anesthesiology at [facility]. The right groin was prepped and draped in the usual sterile fashion. Thereafter using modified Seldinger technique, transfemoral access into the right common femoral artery was obtained without difficulty. Over a 0.035 inch  guidewire an 8 French 25 cm Pinnacle sheath was inserted. Through this, and also over a 0.035 inch guidewire a connection of a Berenstein 125 cm catheter inside of an 087 95 cm balloon guide catheter was advanced to the left common carotid artery. The guidewire, and the Berenstein support catheter were then removed. Good aspiration obtained from the hub of the balloon guide catheter. A gentle control arteriogram performed through balloon guide catheter demonstrated no evidence of spasms, dissections or of intraluminal filling defects. FINDINGS: However, the left common carotid arteriogram demonstrates the left external carotid artery and its major branches to be widely patent. The left internal carotid artery at the bulb to the cranial skull base also demonstrates wide patency. The petrous, the cavernous and the supraclinoid segments are intact. The left anterior cerebral artery opacifies into the capillary and venous phases with prompt cross-filling of the right anterior cerebral A2 segment from the left internal carotid artery injection. The left middle cerebral artery demonstrates complete occlusion in its mid M1 segment. The delayed arterial phase demonstrates partial retrograde opacification of the anterior to middle perisylvian branches. PROCEDURE: Over a 0.014 inch standard Synchro micro guidewire with a J configuration the combination of a Trevo ProVue 021 microcatheter inside of a 6 French 132 cm Catalyst guide catheter was advanced using biplane roadmap technique and constant fluoroscopic guidance to the supraclinoid left ICA. The left middle cerebral artery was then entered with the micro guidewire and into the superior division of the left middle cerebral artery followed by the microcatheter. The guidewire was removed. Good aspiration obtained from the hub of the microcatheter. A gentle control arteriogram performed through this demonstrated safe position of the tip of the microcatheter which was then  connected to continuous heparinized saline infusion. The 6 [place] Catalyst guide catheter was then advanced into the distal left middle cerebral artery with near complete occlusion of the aspiration with a Penumbra aspiration device for approximately 2-1/2 minutes. A 4 mm x 40 mm Solitaire X retrieval device was then advanced to the distal end of the microcatheter and deployed with slight forward gentle traction with the right hand on the delivery micro guidewire, and retrieving the delivery microcatheter. Proximal flow arrest was then established by inflating the balloon of the balloon guide catheter in the mid left internal carotid artery. With constant aspiration applied at the hub of the 6 [place] Catalyst guide catheter, and with a 60 mL syringe at the hub of the balloon guide catheter, the combination of  the retrieval device, and the microcatheter and the 6 [place] Catalyst catheter were retrieved and removed. Flow arrest was then reversed. A control arteriogram performed through the balloon guide catheter in the left internal carotid artery continued to demonstrate occluded left middle cerebral M1 segment. No evidence of clot was seen in the aspirate or entangled in the retrieval device. A second pass was then made again using the above combination. At this time access with the micro guidewire and the microcatheter was obtained in the inferior division of the left middle cerebral artery M2 M3 region where the microcatheter was positioned. The guidewire was removed. Good aspiration obtained from the hub of the microcatheter. A gentle control arteriogram performed through the microcatheter demonstrates safe position of the tip of the microcatheter. A 6 mm x 40 mm Solitaire X retrieval device was then advanced to the distal end of the microcatheter. The O ring on the delivery microcatheter was loosened. With slight forward gentle traction with the right hand on the delivery micro guidewire, with the left hand the  delivery microcatheter was retrieved deploying the retrieval device. The 6 [place] Catalyst guide was now engaged again in the occluded left middle cerebral artery distally to near complete occlusion to aspiration with the Penumbra aspiration device over 2-1/2 minutes. With proximal flow arrest and constant aspiration being applied at the hub of the balloon guide catheter in the left internal carotid artery, the combination was retrieved and removed. Again no evidence of a clot was seen in the aspirate or in the retrieval device. A control arteriogram performed through the balloon guide catheter in the left internal carotid artery again demonstrated no change in the occluded left middle cerebral artery M1 segment. A third pass was then made using a large-bore catheter inside of which was the 021 Trevo microcatheter again advanced as the combination over a 0.14 inch standard Synchro micro guidewire to the supraclinoid left ICA. Again the micro guidewire was then gently manipulated with a torque device and this time advanced into the middle branch of the left MCA trifurcation between the inferior and the superior divisions. This is then followed by the microcatheter into the M2 M3 region. The guidewire was removed. Good aspiration obtained from the hub of the microcatheter. A gentle control arteriogram performed through the microcatheter demonstrated safe position of the tip of the microcatheter which was then connected to continuous heparinized saline infusion. A 4 mm x 40 mm Solitaire X retrieval device was again advanced to the distal end of the microcatheter. This was again deployed in the usual manner. A gentle control arteriogram performed through the 6 French large-bore catheter in the proximal left MCA now demonstrated a TICI 2c revascularization with an overlying severe atherosclerotic stenosis at the origin of the middle branch and extending into the origin of the inferior division. Again with proximal flow  arrest in the left internal carotid artery, and constant aspiration with the Penumbra aspirate device with the large-bore catheter embedded in the occluded distal left MCA over 2-1/2 minutes the combination was retrieved and removed. Again no evidence of clot was found in the retrieval device, or aspirate. A control arteriogram performed following flow reversal in the left internal carotid artery demonstrated angiographically occluded left middle cerebral artery. It was felt overlying pathology was related to an underlying atherosclerotic plaque causing significant stenosis. Following discussion with the referring neuro hospitalist, it was decided to proceed with rescue stenting. At this time, the patient was loaded with Brilinta 180 mg, and aspirin  81 mg via an orogastric tube. A combination of a Headway 17 2 tip microcatheter inside of a 014 inch standard Synchro micro guidewire inside of a large-bore support catheter was then advanced as mentioned earlier to the supraclinoid left ICA. The micro guidewire was then gently manipulated with a torque device and advanced without difficulty into the inferior division of the left MCA trifurcation followed by the microcatheter. The guidewire was removed. Good aspiration obtained from the hub of the Headway microcatheter. A gentle control arteriogram performed through microcatheter demonstrated safe position of the tip of the microcatheter. A 4 mm x 24 mm Atlas stent delivery system was then advanced to the distal end of the microcatheter, the delivery microcatheter was then positioned such that the proximal portion of the landing zone stent would be just inside the left middle cerebral artery origin. The O ring on the delivery microcatheter was loosened. With slight forward gentle traction with the right hand on the delivery micro guidewire with the left hand the delivery microcatheter was retrieved unsheathing the distal and the proximal portion of the stent in excellent  position. The tines of the stent opened completely proximally and distally. A control arteriogram performed through the large bore catheter demonstrated excellent revascularization of the inferior division into M3 M4 branches. The middle branch of the trifurcation remained occluded. The micro guidewire was reintroduced into the microcatheter and advanced without difficulty into the proximal portion of the deployed stent. Using a torque device, access was obtained into the middle branch without difficulty followed by the microcatheter was advanced to the M2 M3 region. The guidewire was removed. Good aspiration obtained from the hub of the microcatheter which was then connected to continuous heparinized saline infusion. Again another 4 mm x 24 mm Atlas Neuroform stent was advanced to the distal end of the microcatheter. This was again deployed such that the proximal portion of the stent was in the proximal portion of the left MCA. Following deployment, a control arteriogram performed through the large bore catheter in the left middle cerebral artery demonstrated a TICI 2c revascularization. A control arteriogram performed approximately 5 minutes later demonstrated progressive slow flow in the middle branch of the stented segment. This prompted the use of approximately 6 mg of intra-arterial Integrilin for 10 minutes. A control arteriogram performed approximately 10 minutes later demonstrated slow revascularization of this stented branch. It was, therefore, decided to stop at this juncture. The large-bore catheter and the balloon guide catheter were retrieved and removed. A CT of the brain demonstrated no evidence of a gross hemorrhage, mass effect or midline shift. There was dense contrast staining in the basal ganglia region. Following this it was decided to proceed with a slow IV infusion of aggrastat and half the minimal dose run as an infusion over approximately 8-12 hours in order to protect the stented segments  of the left middle cerebral artery. Patient was left intubated on account of the procedure, and also patient had received IV tPA and multiple dual antiplatelets. Additionally, the 8 French Pinnacle sheath in the right groin was left and connected to continuous heparinized saline infusion. Prior to the placement of rescue stents at the superior and inferior divisions of the left middle cerebral artery, a CT was performed of the brain on the table. This continued to demonstrate no evidence of a gross hemorrhage, or mass effect or midline shift. Contrast stain was seen in the basal ganglia region. Distal pulses remained palpable in the dorsalis pedis, and the posterior tibial  regions bilaterally. The patient was then transferred intubated to the neuro ICU to continue with post revascularization care. IMPRESSION: Status post endovascular revascularization of occluded left middle cerebral artery M1 segment, with 2 passes with the Solitaire 4 mm x 40 mm X retrieval device, 1 pass with the 6 mm x 40 mm Solitaire X retrieval device, with Penumbra aspiration, and placement of a rescue stents in the superior and inferior divisions of the left middle cerebral artery achieving a TICI 2b to TICI 2c revascularization. PLAN: Follow-up in the clinic 4 weeks post discharge. Electronically Signed   By: Julieanne Cotton M.D.   On: 10/03/2019 09:56   CT CEREBRAL PERFUSION W CONTRAST  Result Date: 10/02/2019 CLINICAL DATA:  Initial evaluation for acute headache, right-sided weakness. EXAM: CT ANGIOGRAPHY HEAD AND NECK CT PERFUSION BRAIN TECHNIQUE: Multidetector CT imaging of the head and neck was performed using the standard protocol during bolus administration of intravenous contrast. Multiplanar CT image reconstructions and MIPs were obtained to evaluate the vascular anatomy. Carotid stenosis measurements (when applicable) are obtained utilizing NASCET criteria, using the distal internal carotid diameter as the denominator.  Multiphase CT imaging of the brain was performed following IV bolus contrast injection. Subsequent parametric perfusion maps were calculated using RAPID software. CONTRAST:  OMNIPAQUE IOHEXOL 350 MG/ML SOLN COMPARISON:  Prior head CT from earlier same day. FINDINGS: CTA NECK FINDINGS Aortic arch: Visualized aortic arch normal in caliber. Bovine arch with common origin of the right brachiocephalic and left common carotid artery noted. Mild-to-moderate atheromatous plaque about the arch and origin of the great vessels without hemodynamically significant stenosis. Visualized subclavian arteries widely patent. Right carotid system: Right common carotid artery patent from its origin to the bifurcation without stenosis. Scattered eccentric calcified plaque about the right bifurcation/proximal right ICA with stenosis of up to approximately 50% by NASCET criteria. Right ICA otherwise widely patent distally to the skull base without stenosis, dissection, or occlusion. Left carotid system: Calcified plaque at the origin of the left CCA without high-grade stenosis. Left CCA otherwise patent to the bifurcation without stenosis. Mild scattered calcified plaque about the left bifurcation/proximal left ICA without significant stenosis. Left ICA widely patent distally to the skull base without stenosis, dissection or occlusion. Vertebral arteries: Both vertebral arteries arise from the subclavian arteries. Right vertebral artery dominant with a diffusely hypoplastic left vertebral artery. Focal atheromatous plaque at the origin of the right vertebral artery with approximate 50% stenosis. Right vertebral artery otherwise widely patent within the neck. 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 Distal reconstitution via muscular branches at the left V3 segment (series 7, image 158). Left vertebral is patent as it courses into the cranial vault. Skeleton: No acute osseous abnormality. No discrete or worrisome  osseous lesions. Other neck: No other acute soft tissue abnormality within the neck. No adenopathy. Subcentimeter hypodense nodule noted within the right thyroid lobe, of doubtful significance given size and patient age. No follow-up imaging recommended regarding this lesion. Upper chest: Visualized upper chest demonstrates no acute finding. Partially visualized lungs are clear. Paraseptal and centrilobular emphysematous changes noted at the lung apices. Review of the MIP images confirms the above findings CTA HEAD FINDINGS Anterior circulation: Petrous segments widely patent bilaterally. Cavernous and supraclinoid ICAs patent without flow-limiting stenosis. A1 segments patent bilaterally. Right A1 hypoplastic, accounting for the slightly diminutive right ICA is compared to the left. Normal anterior communicating artery. Anterior cerebral arteries widely patent to their distal aspects. Right M1 widely patent. Normal right MCA  bifurcation. Distal right MCA branches well perfused. Left M1 patent proximally. There is occlusive thrombus within the distal left M1 segment/left MCA bifurcation (series 7, image 94). Fairly good perfusion seen within the left MCA branches distally, which could be related to subocclusive thrombus and/or collateralization. Posterior circulation: Vertebral arteries patent to the vertebrobasilar junction without stenosis. Neither PICA of visualized. Basilar widely patent to its distal aspect without stenosis. Superior cerebral arteries patent bilaterally. Both PCAs primarily supplied via the basilar and are well perfused or distal aspects. Small hypoplastic posterior communicating arteries noted bilaterally. Venous sinuses: Patent. Anatomic variants: None significant.  No aneurysm. Review of the MIP images confirms the above findings CT Brain Perfusion Findings: ASPECTS: 10 CBF (<30%) Volume: 19mL Perfusion (Tmax>6.0s) volume: 94mL Mismatch Volume: 75mL Infarction Location:Patchy acute core  infarct seen involving the cortical and subcortical aspect of the mid and posterior left frontal region. Moderate sized surrounding ischemic penumbra. IMPRESSION: CTA HEAD AND NECK IMPRESSION: 1. Positive CTA for emergent large vessel occlusion, with occlusive thrombus involving the distal left M1 segment/left MCA bifurcation. Fairly robust perfusion within the distal left MCA branches distally, could be related to subocclusive thrombus and/or collateralization. 2. 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 3. 50% stenosis at the origin of the dominant right vertebral artery. 4. Hypoplastic left vertebral artery, occluded within the neck, with distal reconstitution by the skull base. 5.  Emphysema (ICD10-J43.9). CT PERFUSION IMPRESSION: 19 cc acute core infarct involving the mid and posterior left frontal region, left MCA distribution. 94 cc surrounding ischemic penumbra, mismatch volume 75 cc. Critical Value/emergent results were called by telephone at the time of interpretation on 10/02/2019 at 9:05 pm to provider Dr. Deretha Emory, who verbally acknowledged these results. Electronically Signed   By: Rise Mu M.D.   On: 10/02/2019 21:31   DG Chest Port 1 View  Result Date: 10/05/2019 CLINICAL DATA:  Acute respiratory failure with hypoxia. EXAM: PORTABLE CHEST 1 VIEW COMPARISON:  10/03/2019 FINDINGS: The endotracheal tube and NG tubes have been removed. The cardiac silhouette, mediastinal and hilar contours are normal. The lungs are clear. No pleural effusions. The bony thorax is intact. IMPRESSION: No acute cardiopulmonary findings. Electronically Signed   By: Rudie Meyer M.D.   On: 10/05/2019 08:26   DG Chest Port 1 View  Result Date: 10/03/2019 CLINICAL DATA:  54 year old female status post endovascular treatment of left MCA M1 emergent large vessel occlusion. EXAM: PORTABLE CHEST 1 VIEW COMPARISON:  CTA head and neck earlier tonight. FINDINGS: Portable AP supine view at 0141  hours. Endotracheal tube tip in good position between the level the clavicles and carina. Enteric tube courses to the stomach but the side hole is at the level of the distal esophagus. Normal cardiac size and mediastinal contours. Allowing for portable technique the lungs are clear. No acute osseous abnormality identified. Negative visible bowel gas pattern. IMPRESSION: 1. Advance the enteric tube 5 cm to ensure side hole placement within the stomach. 2. Endotracheal tube tip in good position. 3.  No acute cardiopulmonary abnormality. Electronically Signed   By: Odessa Fleming M.D.   On: 10/03/2019 01:57   DG Abd Portable 1V  Result Date: 10/03/2019 CLINICAL DATA:  NG tube placement. EXAM: PORTABLE ABDOMEN - 1 VIEW COMPARISON:  Chest x-ray, same date. FINDINGS: The NG tube is in good position with its tip in the body region the stomach laterally. The proximal port is now well below the GE junction. Contrast noted in both renal collecting systems and ureters.  There is also some contrast surrounding the right kidney suggesting a urinary leak or obstruction. Recommend clinical correlation. IMPRESSION: 1. NG tube in good position. 2. Contrast surrounding the right kidney suggesting a urinary leak or obstruction. Recommend clinical correlation. CT abdomen/pelvis may be helpful for further evaluation. Electronically Signed   By: Rudie Meyer M.D.   On: 10/03/2019 07:07   DG Swallowing Func-Speech Pathology  Result Date: 10/05/2019 Objective Swallowing Evaluation: Type of Study: MBS-Modified Barium Swallow Study  Patient Details Name: MARNAE MADANI MRN: [id number] Date of Birth: Mar 08, 1966 Today's Date: 10/05/2019 Time: SLP Start Time (ACUTE ONLY): 1323 -SLP Stop Time (ACUTE ONLY): 1334 SLP Time Calculation (min) (ACUTE ONLY): 11 min Past Medical History: No past medical history on file. Past Surgical History: Past Surgical History: Procedure Laterality Date . IR CT HEAD LTD  10/03/2019 . IR INTRA CRAN STENT  10/03/2019 . IR  PATIENT EVAL TECH 0-60 MINS  10/03/2019 . IR PERCUTANEOUS ART THROMBECTOMY/INFUSION INTRACRANIAL INC DIAG ANGIO  10/03/2019 . 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 Intubated 3/8-3/10.  Subjective: alert, pleasant, not very communicative Assessment / Plan / Recommendation CHL IP CLINICAL IMPRESSIONS 10/05/2019 Clinical Impression Pt demonstrates a primary oral dysphagia with mild right anterior labial weakness and decreased lingual strength. Pt has mild anterior bolus loss with thin via cup, better containment with a straw. Pt also has mild residue on the lingual body that spills to pharynx post swallow, resulting in some instances of trace spillage to pyrifomr sinuses or even very trace penetration, all sensed and cleared with a second swallow. Pt able to masticate soft solids and again clears residue with a second swallow. Recommend dys 3 mech soft given UE weakness and potential difficulty cutting and preparing foods. Thin liquids. SLP will f/u for tolerance.  SLP Visit Diagnosis Dysphagia, unspecified (R13.10) Attention and concentration deficit following -- Frontal lobe and executive function deficit following -- Impact on safety and function Mild aspiration risk   CHL IP TREATMENT RECOMMENDATION 10/05/2019 Treatment Recommendations Therapy as outlined in treatment plan below   Prognosis 10/04/2019 Prognosis for Safe Diet Advancement Good Barriers to Reach Goals -- Barriers/Prognosis Comment -- CHL IP DIET RECOMMENDATION 10/05/2019 SLP Diet Recommendations Dysphagia 3 (Mech soft) solids;Thin liquid Liquid Administration via Straw Medication Administration Whole meds with puree Compensations Slow rate;Small sips/bites Postural Changes Remain semi-upright after after feeds/meals  (Comment);Seated upright at 90 degrees   CHL IP OTHER RECOMMENDATIONS 10/05/2019 Recommended Consults -- Oral Care Recommendations Oral care BID Other Recommendations --   CHL IP FOLLOW UP RECOMMENDATIONS 10/05/2019 Follow up Recommendations Inpatient Rehab   CHL IP FREQUENCY AND DURATION 10/05/2019 Speech Therapy Frequency (ACUTE ONLY) min 2x/week Treatment Duration --      CHL IP ORAL PHASE 10/05/2019 Oral Phase Impaired Oral - Pudding Teaspoon -- Oral - Pudding Cup -- Oral - Honey Teaspoon -- Oral - Honey Cup -- Oral - Nectar Teaspoon -- Oral - Nectar Cup -- Oral - Nectar Straw -- Oral - Thin Teaspoon -- Oral - Thin Cup Right anterior bolus loss Oral - Thin Straw Weak lingual manipulation;Lingual/palatal residue Oral - Puree Lingual/palatal residue Oral - Mech Soft -- Oral - Regular Lingual/palatal residue Oral - Multi-Consistency -- Oral - Pill Decreased bolus cohesion Oral Phase - Comment --  CHL IP PHARYNGEAL  PHASE 10/05/2019 Pharyngeal Phase WFL Pharyngeal- Pudding Teaspoon -- Pharyngeal -- Pharyngeal- Pudding Cup -- Pharyngeal -- Pharyngeal- Honey Teaspoon -- Pharyngeal -- Pharyngeal- Honey Cup -- Pharyngeal -- Pharyngeal- Nectar Teaspoon -- Pharyngeal -- Pharyngeal- Nectar Cup -- Pharyngeal -- Pharyngeal- Nectar Straw -- Pharyngeal -- Pharyngeal- Thin Teaspoon -- Pharyngeal -- Pharyngeal- Thin Cup -- Pharyngeal -- Pharyngeal- Thin Straw -- Pharyngeal -- Pharyngeal- Puree -- Pharyngeal -- Pharyngeal- Mechanical Soft -- Pharyngeal -- Pharyngeal- Regular -- Pharyngeal -- Pharyngeal- Multi-consistency -- Pharyngeal -- Pharyngeal- Pill -- Pharyngeal -- Pharyngeal Comment --  No flowsheet data found. Harlon Ditty, MA CCC-SLP Acute Rehabilitation Services Pager [phone] Office [phone] Claudine Mouton 10/05/2019, 1:42 PM              IR PATIENT EVAL TECH 0-60 MINS  Result Date: 10/03/2019 Carlyon Prows     10/03/2019  2:34 PM Right 8 french sheath removal, no hematoma present prior to sheath removal.   Held manual pressure with a Quikclot pad for 25 minutes.  No complications and distal pulses still present.  Verified site with RN and gave instructions on when to remove Quikclot pad.  Lynann Beaver RT R, VI [place] RT R  ECHOCARDIOGRAM COMPLETE  Result Date: 10/03/2019    ECHOCARDIOGRAM REPORT   Patient Name:   JULIANA BOLING [facility] Date of Exam: 10/03/2019 Medical Rec #:  [id number]       Height:       65.0 in Accession #:    [id number]      Weight:       163.4 lb Date of Birth:  10/18/1965      BSA:          1.815 m Patient Age:    53 years        BP:           116/62 mmHg Patient Gender: F               HR:           93 bpm. Exam Location:  Inpatient Procedure: 2D Echo, Cardiac Doppler and Color Doppler Indications:   CVA  History:       Patient has no prior history of Echocardiogram examinations.                Stroke.  Sonographer:   Thurman Coyer RDCS (AE) Referring      [id number] ASHISH ARORA Phys: IMPRESSIONS  1. Left ventricular ejection fraction, by estimation, is 60 to 65%. The left ventricle has normal function. The left ventricle has no regional wall motion abnormalities. Left ventricular diastolic parameters were normal.  2. Right ventricular systolic function is normal. The right ventricular size is normal.  3. The mitral valve is normal in structure. No evidence of mitral valve regurgitation. No evidence of mitral stenosis.  4. The aortic valve is normal in structure. Aortic valve regurgitation is not visualized. No aortic stenosis is present.  5. The inferior vena cava is normal in size with greater than 50% respiratory variability, suggesting right atrial pressure of 3 mmHg. Comparison(s): No prior Echocardiogram. FINDINGS  Left Ventricle: Left ventricular ejection fraction, by estimation, is 60 to 65%. The left ventricle has normal function. The left ventricle has no regional wall motion abnormalities. The left ventricular internal cavity size was normal in size. There is  no left ventricular  hypertrophy. Left ventricular diastolic parameters were normal. Normal left ventricular filling pressure. Right Ventricle: The right ventricular size is normal. No increase in  right ventricular wall thickness. Right ventricular systolic function is normal. Left Atrium: Left atrial size was normal in size. Right Atrium: Right atrial size was normal in size. Pericardium: There is no evidence of pericardial effusion. Mitral Valve: The mitral valve is normal in structure. Normal mobility of the mitral valve leaflets. No evidence of mitral valve regurgitation. No evidence of mitral valve stenosis. Tricuspid Valve: The tricuspid valve is normal in structure. Tricuspid valve regurgitation is not demonstrated. No evidence of tricuspid stenosis. Aortic Valve: The aortic valve is normal in structure. Aortic valve regurgitation is not visualized. No aortic stenosis is present. Pulmonic Valve: The pulmonic valve was normal in structure. Pulmonic valve regurgitation is not visualized. No evidence of pulmonic stenosis. Aorta: The aortic root is normal in size and structure. Venous: The inferior vena cava is normal in size with greater than 50% respiratory variability, suggesting right atrial pressure of 3 mmHg. IAS/Shunts: No atrial level shunt detected by color flow Doppler.  LEFT VENTRICLE PLAX 2D LVIDd:         3.86 cm  Diastology LVIDs:         2.44 cm  LV e' lateral:   9.36 cm/s LV PW:         0.78 cm  LV E/e' lateral: 9.3 LV IVS:        0.90 cm  LV e' medial:    8.59 cm/s LVOT diam:     2.00 cm  LV E/e' medial:  10.1 LV SV:         76 LV SV Index:   42 LVOT Area:     3.14 cm  RIGHT VENTRICLE RV S prime:     15.80 cm/s TAPSE (M-mode): 2.5 cm LEFT ATRIUM             Index       RIGHT ATRIUM          Index LA diam:        3.20 cm 1.76 cm/m  RA Area:     9.71 cm LA Vol (A2C):   26.7 ml 14.71 ml/m RA Volume:   17.60 ml 9.70 ml/m LA Vol (A4C):   33.7 ml 18.57 ml/m LA Biplane Vol: 31.1 ml 17.13 ml/m  AORTIC VALVE LVOT Vmax:    110.00 cm/s LVOT Vmean:  74.700 cm/s LVOT VTI:    0.241 m  AORTA Ao Root diam: 2.60 cm MITRAL VALVE MV Area (PHT): 3.37 cm    SHUNTS MV Decel Time: 225 msec    Systemic VTI:  0.24 m MV E velocity: 86.90 cm/s  Systemic Diam: 2.00 cm MV A velocity: 88.00 cm/s MV E/A ratio:  0.99 Mihai Croitoru MD Electronically signed by Sanda Klein MD Signature Date/Time: 10/03/2019/3:15:50 PM    Final    IR PERCUTANEOUS ART THROMBECTOMY/INFUSION INTRACRANIAL INC DIAG ANGIO  Result Date: 10/04/2019 INDICATION: New onset aphasia, right-sided paralysis, and left gaze deviation. 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 EXAM: 1. EMERGENT LARGE VESSEL OCCLUSION THROMBOLYSIS (anterior CIRCULATION) 2. Intracranial rescue stent placement COMPARISON:  CT angiogram of the head and neck of October 03, 2019. MEDICATIONS: Ancef 2 g IV antibiotic was administered within 1 hour of the procedure. ANESTHESIA/SEDATION: General anesthesia. CONTRAST:  Isovue 300 approximately 120 mL. FLUOROSCOPY TIME:  Fluoroscopy Time: 76 minutes 36 seconds (3040 mGy). COMPLICATIONS: None immediate. TECHNIQUE: Following a full explanation of the procedure along with the potential associated complications, an informed witnessed consent was obtained patient's spouse.  The risks of intracranial hemorrhage of 10%, worsening neurological deficit, ventilator dependency, death and inability to revascularize were all reviewed in detail with the patient's spouse. The patient was then put under general anesthesia by the Department of Anesthes[facility] at [facility]. The right groin was prepped and draped in the usual sterile fashion. Thereafter using modified Seldinger technique, transfemoral access into the right common femoral artery was obtained without difficulty. Over a 0.035 inch guidewire an 8 French 25 cm Pinnacle sheath was inserted. Through this, and also over a 0.035 inch guidewire a connection of a Berenstein 125  cm catheter inside of an 087 95 cm balloon guide catheter was advanced to the left common carotid artery. The guidewire, and the Berenstein support catheter were then removed. Good aspiration obtained from the hub of the balloon guide catheter. A gentle control arteriogram performed through balloon guide catheter demonstrated no evidence of spasms, dissections or of intraluminal filling defects. FINDINGS: However, the left common carotid arteriogram demonstrates the left external carotid artery and its major branches to be widely patent. The left internal carotid artery at the bulb to the cranial skull base also demonstrates wide patency. The petrous, the cavernous and the supraclinoid segments are intact. The left anterior cerebral artery opacifies into the capillary and venous phases with prompt cross-filling of the right anterior cerebral A2 segment from the left internal carotid artery injection. The left middle cerebral artery demonstrates complete occlusion in its mid M1 segment. The delayed arterial phase demonstrates partial retrograde opacification of the anterior to middle perisylvian branches. PROCEDURE: Over a 0.014 inch standard Synchro micro guidewire with a J configuration the combination of a Trevo ProVue 021 microcatheter inside of a 6 French 132 cm Catalyst guide catheter was advanced using biplane roadmap technique and constant fluoroscopic guidance to the supraclinoid left ICA. The left middle cerebral artery was then entered with the micro guidewire and into the superior division of the left middle cerebral artery followed by the microcatheter. The guidewire was removed. Good aspiration obtained from the hub of the microcatheter. A gentle control arteriogram performed through this demonstrated safe position of the tip of the microcatheter which was then connected to continuous heparinized saline infusion. The 6 [place] Catalyst guide catheter was then advanced into the distal left middle cerebral  artery with near complete occlusion of the aspiration with a Penumbra aspiration device for approximately 2-1/2 minutes. A 4 mm x 40 mm Solitaire X retrieval device was then advanced to the distal end of the microcatheter and deployed with slight forward gentle traction with the right hand on the delivery micro guidewire, and retrieving the delivery microcatheter. Proximal flow arrest was then established by inflating the balloon of the balloon guide catheter in the mid left internal carotid artery. With constant aspiration applied at the hub of the 6 [place] Catalyst guide catheter, and with a 60 mL syringe at the hub of the balloon guide catheter, the combination of the retrieval device, and the microcatheter and the 6 [place] Catalyst catheter were retrieved and removed. Flow arrest was then reversed. A control arteriogram performed through the balloon guide catheter in the left internal carotid artery continued to demonstrate occluded left middle cerebral M1 segment. No evidence of clot was seen in the aspirate or entangled in the retrieval device. A second pass was then made again using the above combination. At this time access with the micro guidewire and the microcatheter was obtained in the inferior division of the left middle cerebral artery M2  M3 region where the microcatheter was positioned. The guidewire was removed. Good aspiration obtained from the hub of the microcatheter. A gentle control arteriogram performed through the microcatheter demonstrates safe position of the tip of the microcatheter. A 6 mm x 40 mm Solitaire X retrieval device was then advanced to the distal end of the microcatheter. The O ring on the delivery microcatheter was loosened. With slight forward gentle traction with the right hand on the delivery micro guidewire, with the left hand the delivery microcatheter was retrieved deploying the retrieval device. The 6 [place] Catalyst guide was now engaged again in the occluded left  middle cerebral artery distally to near complete occlusion to aspiration with the Penumbra aspiration device over 2-1/2 minutes. With proximal flow arrest and constant aspiration being applied at the hub of the balloon guide catheter in the left internal carotid artery, the combination was retrieved and removed. Again no evidence of a clot was seen in the aspirate or in the retrieval device. A control arteriogram performed through the balloon guide catheter in the left internal carotid artery again demonstrated no change in the occluded left middle cerebral artery M1 segment. A third pass was then made using a large-bore catheter inside of which was the 021 Trevo microcatheter again advanced as the combination over a 0.14 inch standard Synchro micro guidewire to the supraclinoid left ICA. Again the micro guidewire was then gently manipulated with a torque device and this time advanced into the middle branch of the left MCA trifurcation between the inferior and the superior divisions. This is then followed by the microcatheter into the M2 M3 region. The guidewire was removed. Good aspiration obtained from the hub of the microcatheter. A gentle control arteriogram performed through the microcatheter demonstrated safe position of the tip of the microcatheter which was then connected to continuous heparinized saline infusion. A 4 mm x 40 mm Solitaire X retrieval device was again advanced to the distal end of the microcatheter. This was again deployed in the usual manner. A gentle control arteriogram performed through the 6 French large-bore catheter in the proximal left MCA now demonstrated a TICI 2c revascularization with an overlying severe atherosclerotic stenosis at the origin of the middle branch and extending into the origin of the inferior division. Again with proximal flow arrest in the left internal carotid artery, and constant aspiration with the Penumbra aspirate device with the large-bore catheter embedded  in the occluded distal left MCA over 2-1/2 minutes the combination was retrieved and removed. Again no evidence of clot was found in the retrieval device, or aspirate. A control arteriogram performed following flow reversal in the left internal carotid artery demonstrated angiographically occluded left middle cerebral artery. It was felt overlying pathology was related to an underlying atherosclerotic plaque causing significant stenosis. Following discussion with the referring neuro hospitalist, it was decided to proceed with rescue stenting. At this time, the patient was loaded with Brilinta 180 mg, and aspirin 81 mg via an orogastric tube. A combination of a Headway 17 2 tip microcatheter inside of a 014 inch standard Synchro micro guidewire inside of a large-bore support catheter was then advanced as mentioned earlier to the supraclinoid left ICA. The micro guidewire was then gently manipulated with a torque device and advanced without difficulty into the inferior division of the left MCA trifurcation followed by the microcatheter. The guidewire was removed. Good aspiration obtained from the hub of the Headway microcatheter. A gentle control arteriogram performed through microcatheter demonstrated safe position of the  tip of the microcatheter. A 4 mm x 24 mm Atlas stent delivery system was then advanced to the distal end of the microcatheter, the delivery microcatheter was then positioned such that the proximal portion of the landing zone stent would be just inside the left middle cerebral artery origin. The O ring on the delivery microcatheter was loosened. With slight forward gentle traction with the right hand on the delivery micro guidewire with the left hand the delivery microcatheter was retrieved unsheathing the distal and the proximal portion of the stent in excellent position. The tines of the stent opened completely proximally and distally. A control arteriogram performed through the large bore catheter  demonstrated excellent revascularization of the inferior division into M3 M4 branches. The middle branch of the trifurcation remained occluded. The micro guidewire was reintroduced into the microcatheter and advanced without difficulty into the proximal portion of the deployed stent. Using a torque device, access was obtained into the middle branch without difficulty followed by the microcatheter was advanced to the M2 M3 region. The guidewire was removed. Good aspiration obtained from the hub of the microcatheter which was then connected to continuous heparinized saline infusion. Again another 4 mm x 24 mm Atlas Neuroform stent was advanced to the distal end of the microcatheter. This was again deployed such that the proximal portion of the stent was in the proximal portion of the left MCA. Following deployment, a control arteriogram performed through the large bore catheter in the left middle cerebral artery demonstrated a TICI 2c revascularization. A control arteriogram performed approximately 5 minutes later demonstrated progressive slow flow in the middle branch of the stented segment. This prompted the use of approximately 6 mg of intra-arterial Integrilin for 10 minutes. A control arteriogram performed approximately 10 minutes later demonstrated slow revascularization of this stented branch. It was, therefore, decided to stop at this juncture. The large-bore catheter and the balloon guide catheter were retrieved and removed. A CT of the brain demonstrated no evidence of a gross hemorrhage, mass effect or midline shift. There was dense contrast staining in the basal ganglia region. Following this it was decided to proceed with a slow IV infusion of aggrastat and half the minimal dose run as an infusion over approximately 8-12 hours in order to protect the stented segments of the left middle cerebral artery. Patient was left intubated on account of the procedure, and also patient had received IV tPA and  multiple dual antiplatelets. Additionally, the 8 French Pinnacle sheath in the right groin was left and connected to continuous heparinized saline infusion. Prior to the placement of rescue stents at the superior and inferior divisions of the left middle cerebral artery, a CT was performed of the brain on the table. This continued to demonstrate no evidence of a gross hemorrhage, or mass effect or midline shift. Contrast stain was seen in the basal ganglia region. Distal pulses remained palpable in the dorsalis pedis, and the posterior tibial regions bilaterally. The patient was then transferred intubated to the neuro ICU to continue with post revascularization care. IMPRESSION: Status post endovascular revascularization of occluded left middle cerebral artery M1 segment, with 2 passes with the Solitaire 4 mm x 40 mm X retrieval device, 1 pass with the 6 mm x 40 mm Solitaire X retrieval device, with Penumbra aspiration, and placement of a rescue stents in the superior and inferior divisions of the left middle cerebral artery achieving a TICI 2b to TICI 2c revascularization. PLAN: Follow-up in the clinic 4 weeks post  discharge. Electronically Signed   By: Julieanne Cotton M.D.   On: 10/03/2019 09:56   CT HEAD CODE STROKE WO CONTRAST  Result Date: 10/02/2019 CLINICAL DATA:  Code stroke. 54 year old female with severe headache and right side weakness since 1750 hours. EXAM: CT HEAD WITHOUT CONTRAST TECHNIQUE: Contiguous axial images were obtained from the base of the skull through the vertex without intravenous contrast. COMPARISON:  None. FINDINGS: Brain: No midline shift, mass effect, or evidence of intracranial mass lesion. No ventriculomegaly. No acute intracranial hemorrhage identified. No cortically based acute infarct identified. Chronic appearing white matter hypodensity at the anterior left corona radiata on series 2, image 16. Elsewhere gray-white matter differentiation is within normal limits. Vascular:  No suspicious intracranial vascular hyperdensity. Skull: Negative. Sinuses/Orbits: Clear aside from some right posterior ethmoid air cell mucosal thickening. Mastoids and tympanic cavities appear clear. Other: Visualized orbits and scalp soft tissues are within normal limits. ASPECTS [facility] Stroke Program Early CT Score) Total score (0-10 with 10 being normal): 10 IMPRESSION: 1. No acute cortically based infarct or acute intracranial hemorrhage identified. ASPECTS 10. 2. 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 Study discussed by telephone with Dr. Vanetta Mulders on 10/02/2019 at 20:18 . Electronically Signed   By: Odessa Fleming M.D.   On: 10/02/2019 20:20   VAS [place] LOWER EXTREMITY VENOUS (DVT)  Result Date: 10/04/2019  Lower Venous DVTStudy Indications: Stroke.  Comparison Study: No prior exam. Performing Technologist: Kennedy Bucker ARDMS, RVT  Examination Guidelines: A complete evaluation includes B-mode imaging, spectral Doppler, color Doppler, and power Doppler as needed of all accessible portions of each vessel. Bilateral testing is considered an integral part of a complete examination. Limited examinations for reoccurring indications may be performed as noted. The reflux portion of the exam is performed with the patient in reverse Trendelenburg.  +---------+---------------+---------+-----------+----------+-------------------+ RIGHT    CompressibilityPhasicitySpontaneityPropertiesThrombus Aging      +---------+---------------+---------+-----------+----------+-------------------+ CFV      Full           Yes      Yes                                      +---------+---------------+---------+-----------+----------+-------------------+ SFJ      Full                                                             +---------+---------------+---------+-----------+----------+-------------------+ FV Prox  Full                                                              +---------+---------------+---------+-----------+----------+-------------------+ FV Mid   Full                                                             +---------+---------------+---------+-----------+----------+-------------------+ FV DistalFull                                                             +---------+---------------+---------+-----------+----------+-------------------+  PFV                                                   not visualized due                                                        to bandage          +---------+---------------+---------+-----------+----------+-------------------+ POP      Full           Yes      Yes                                      +---------+---------------+---------+-----------+----------+-------------------+ PTV      Full                                                             +---------+---------------+---------+-----------+----------+-------------------+ PERO     Full                                                             +---------+---------------+---------+-----------+----------+-------------------+   +---------+---------------+---------+-----------+----------+--------------+ LEFT     CompressibilityPhasicitySpontaneityPropertiesThrombus Aging +---------+---------------+---------+-----------+----------+--------------+ CFV      Full           Yes      Yes                                 +---------+---------------+---------+-----------+----------+--------------+ SFJ      Full                                                        +---------+---------------+---------+-----------+----------+--------------+ FV Prox  Full                                                        +---------+---------------+---------+-----------+----------+--------------+ FV Mid   Full                                                         +---------+---------------+---------+-----------+----------+--------------+ FV DistalFull                                                        +---------+---------------+---------+-----------+----------+--------------+  PFV      Full                                                        +---------+---------------+---------+-----------+----------+--------------+ POP      Full           Yes      Yes                                 +---------+---------------+---------+-----------+----------+--------------+ PTV      Full                                                        +---------+---------------+---------+-----------+----------+--------------+ PERO     Full                                                        +---------+---------------+---------+-----------+----------+--------------+     Summary: BILATERAL: - No evidence of deep vein thrombosis seen in the lower extremities, bilaterally.  RIGHT: - No cystic structure found in the popliteal fossa.  LEFT: - No cystic structure found in the popliteal fossa.  *See table(s) above for measurements and observations. Electronically signed by Gretta Began MD on 10/04/2019 at 2:47:34 PM.    Final     Labs:  CBC: Recent Labs    10/03/19 0212 10/03/19 1610 10/03/19 0231 10/04/19 0413 10/05/19 0819 10/06/19 0431  WBC 11.4*  --   --  15.0* 11.1* 10.4  HGB 13.8   < > 13.6 10.5* 9.2* 9.8*  HCT 40.9   < > 40.0 33.6* 28.3* 30.4*  PLT 356  --   --  290 243 256   < > = values in this interval not displayed.    COAGS: Recent Labs    10/02/19 1953  INR 0.9  APTT 27    BMP: Recent Labs    10/03/19 0212 10/03/19 0212 10/03/19 0231 10/04/19 0413 10/05/19 0819 10/06/19 0431  NA 139   < > 139 143 141 143  K 4.1   < > 3.8 3.6 3.3* 3.3*  CL 108  --   --  112* 111 110  CO2 22  --   --  23 21* 21*  GLUCOSE 171*  --   --  110* 88 99  BUN 9  --   --  CALCIUM 8.9  --   --  8.2* 8.3* 8.9  CREATININE 0.79  --    --  0.79 0.71 0.67  GFRNONAA >60  --   --  >60 >60 >60  GFRAA >60  --   --  >60 >60 >60   < > = values in this interval not displayed.    LIVER FUNCTION TESTS: Recent Labs    10/02/19 1953  BILITOT 0.4  AST 17  ALT 19  ALKPHOS 74  PROT 7.4  ALBUMIN 4.5    Assessment and Plan:  History of acute CVA  s/p cerebral arteriogram with emergent mechanical thrombectomy of left MCA M1 occlusion, along with rescue Y stenting of left MCA M1 superior and inferior division reocclusion achieving a TICI 2C revascularization 10/02/2019 by Dr. Corliss Skains. Patient's condition improving- awake and alert, following simple commands, moving all extremities. P2Y12 39 PRU yesterday- continue taking Brilinta 90 mg twice daily and Aspirin 81 mg once daily. Plan to follow-up with Dr. Corliss Skains in clinic 4 weeks after discharge- order placed to facilitate this. Further plans per neurology/cardiology- appreciate and agree with management. Please call [facility] with questions/concerns.   Electronically Signed: Elwin Mocha, PA-C 10/06/2019, 8:47 AM   I spent a total of 25 Minutes at the the patient's bedside AND on the patient's hospital floor or unit, greater than 50% of which was counseling/coordinating care for CVA s/p revascularization.

## 2019-10-06 NOTE — Progress Notes (Signed)
Inpatient Rehab Admissions:  Inpatient Rehab Consult received.  I met with patient at the bedside for rehabilitation assessment and to discuss goals and expectations of an inpatient rehab admission.  Returned later to meet with pt, spouse, and daughter at the bedside.  They are adamant that pt return home.  I did discuss that f/u therapy at home would be 3 hrs per week, versus 15-18 hours per week with CIR, so recovery would be much slower, and that home health therapy was not always guaranteed without a payor source.  Explained that medicaid applications usually take time to process and that these first few weeks were critical for rehabilitation.  Pt and her family feel that they are able to provide neurorehab at home.  They will not consider CIR at this time, so we will sign off.   Signed: Shann Medal, PT, DPT Admissions Coordinator [phone] 10/06/19  1:17 PM

## 2019-10-07 ENCOUNTER — Encounter (HOSPITAL_COMMUNITY): Payer: Self-pay | Admitting: Student in an Organized Health Care Education/Training Program

## 2019-10-07 ENCOUNTER — Inpatient Hospital Stay (HOSPITAL_COMMUNITY): Payer: Medicaid Other

## 2019-10-07 LAB — BASIC METABOLIC PANEL
Anion gap: 13 (ref 5–15)
BUN: 10 mg/dL (ref 6–20)
CO2: 19 mmol/L — ABNORMAL LOW (ref 22–32)
Calcium: 8.7 mg/dL — ABNORMAL LOW (ref 8.9–10.3)
Chloride: 110 mmol/L (ref 98–111)
Creatinine, Ser: 0.73 mg/dL (ref 0.44–1.00)
GFR calc Af Amer: >60 mL/min (ref >60)
GFR calc non Af Amer: >60 mL/min (ref >60)
Glucose, Bld: 83 mg/dL (ref 70–99)
Potassium: 4.3 mmol/L (ref 3.5–5.1)
Sodium: 142 mmol/L (ref 135–145)

## 2019-10-07 LAB — GLUCOSE, CAPILLARY
Glucose-Capillary: 89 mg/dL (ref 70–99)
Glucose-Capillary: 89 mg/dL (ref 70–99)
Glucose-Capillary: 70 mg/dL (ref 70–99)
Glucose-Capillary: 81 mg/dL (ref 70–99)

## 2019-10-07 LAB — CBC
HCT: 30.5 % — ABNORMAL LOW (ref 36.0–46.0)
Hemoglobin: 10.1 g/dL — ABNORMAL LOW (ref 12.0–15.0)
MCH: 30.6 pg (ref 26.0–34.0)
MCHC: 33.1 g/dL (ref 30.0–36.0)
MCV: 92.4 fL (ref 80.0–100.0)
Platelets: 196 10*3/uL (ref 150–400)
RBC: 3.3 MIL/uL — ABNORMAL LOW (ref 3.87–5.11)
RDW: 12.5 % (ref 11.5–15.5)
WBC: 9.1 10*3/uL (ref 4.0–10.5)
nRBC: 0 % (ref 0.0–0.2)

## 2019-10-07 IMAGING — CT CT ABD-PEL WO/W CM
4 of 12 series · 9 of 46 positions shown, 16 images · IV contrast (APPLIED)
Comparison: Abdominal radiographs, [DATE]

CLINICAL DATA: Hematuria, rule out urinary leak

EXAM:
CT ABDOMEN AND PELVIS WITHOUT AND WITH CONTRAST
TECHNIQUE: Multidetector CT imaging of the abdomen and pelvis was performed
following the standard protocol before and following the bolus
administration of intravenous contrast.
CONTRAST:  125mL OMNIPAQUE IOHEXOL 300 MG/ML  SOLN

[Series 8: cor pre · coronal · non-contrast · 0.72mm/px · 2 of 92 slices shown, 3 images]
[im 31/92  soft-tissue]
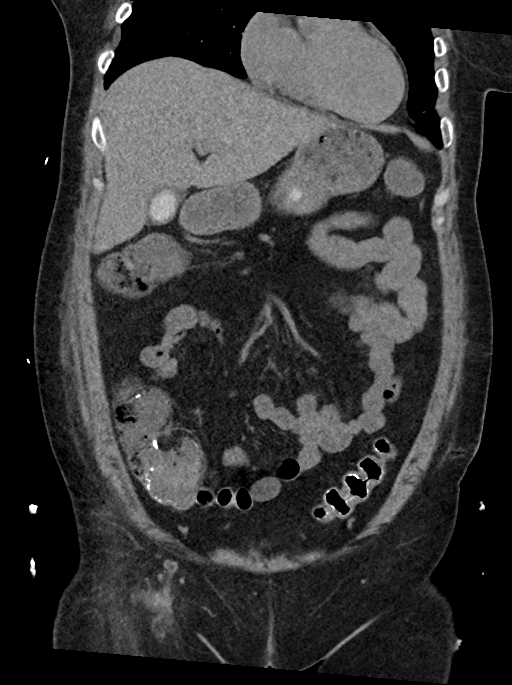
[im 31/92  bone]
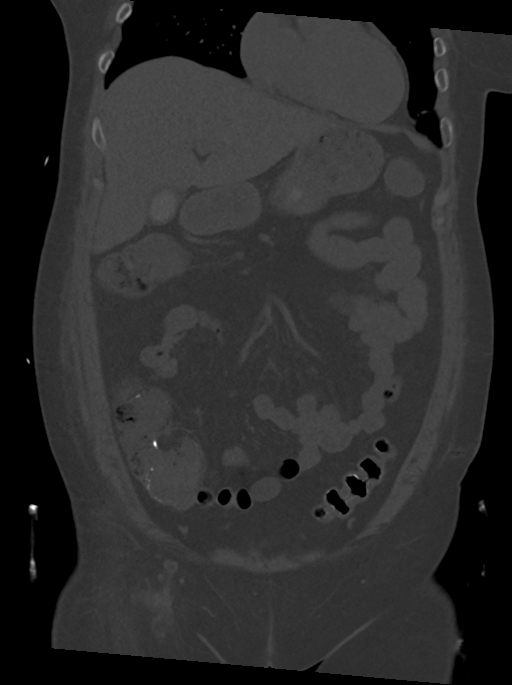
[im 61/92  soft-tissue]
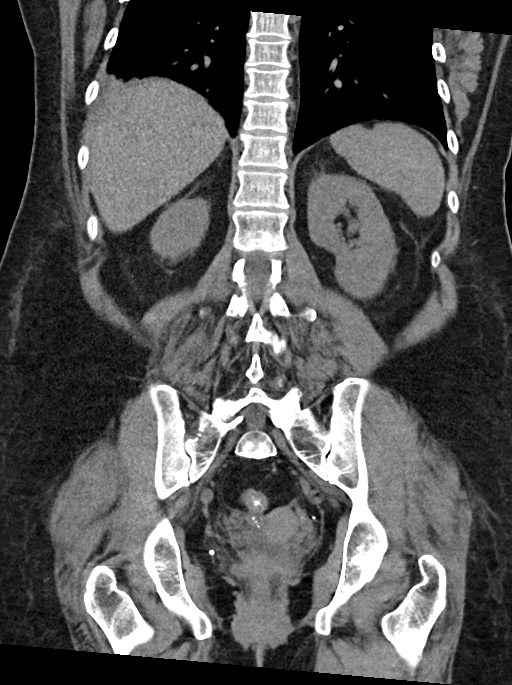

[Series 9: sag pre · sagittal · non-contrast · 0.53mm/px · 1 of 118 slices shown, 2 images]
[im 18/118  soft-tissue]
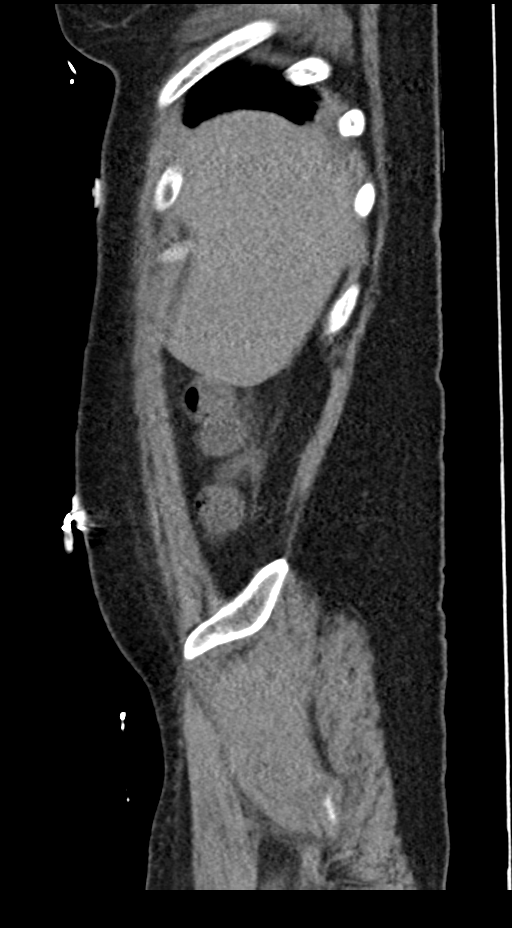
[im 18/118  bone]
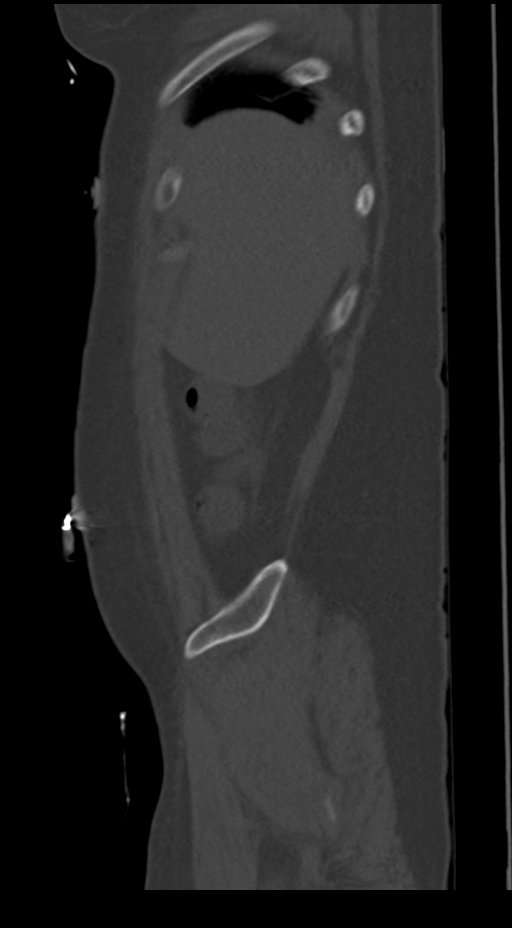

[Series 10: hematuria <45 with · axial · 0.80mm/px · z∈[-364,-78]mm · 4 of 95 slices shown, 9 images]
[im 19/95  soft-tissue]
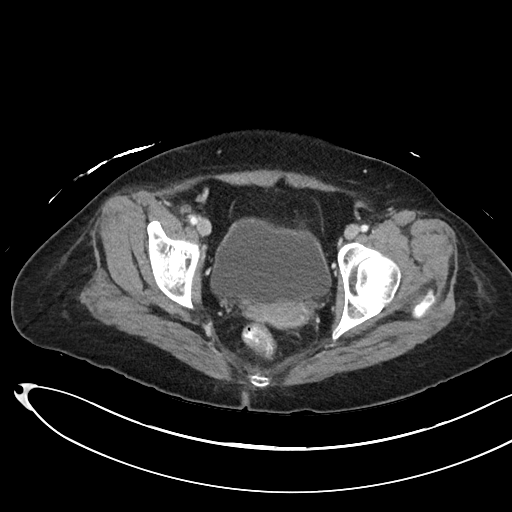
[im 19/95  lung]
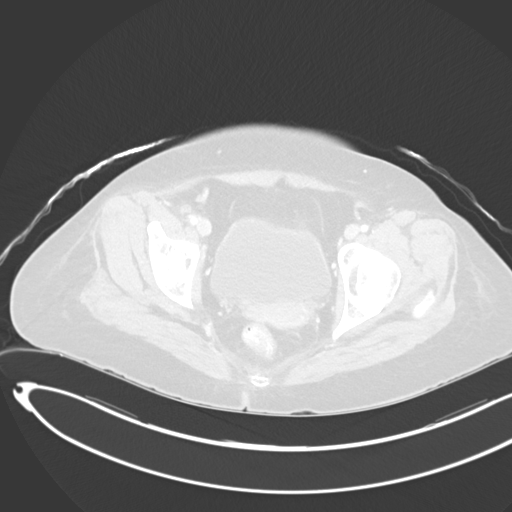
[im 19/95  bone]
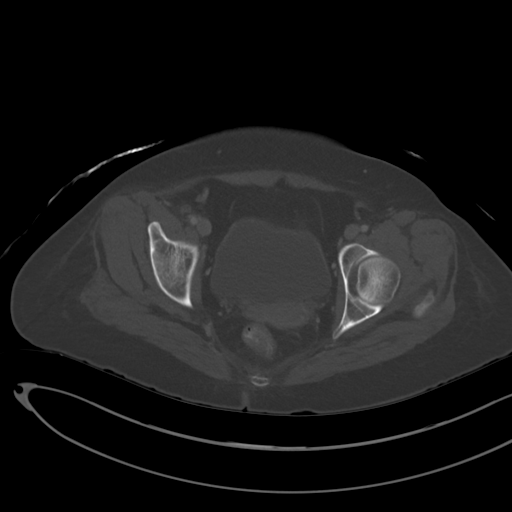
[im 38/95  soft-tissue]
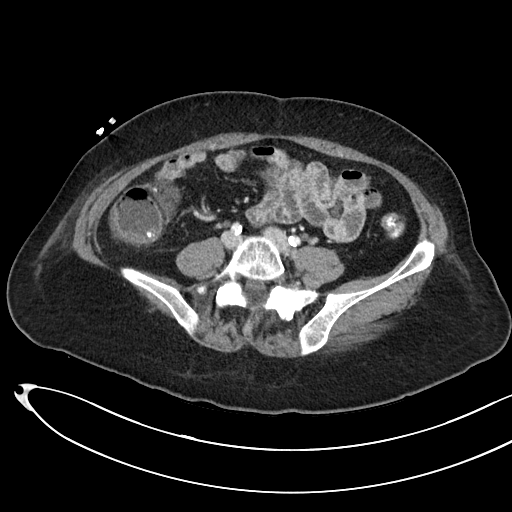
[im 38/95  lung]
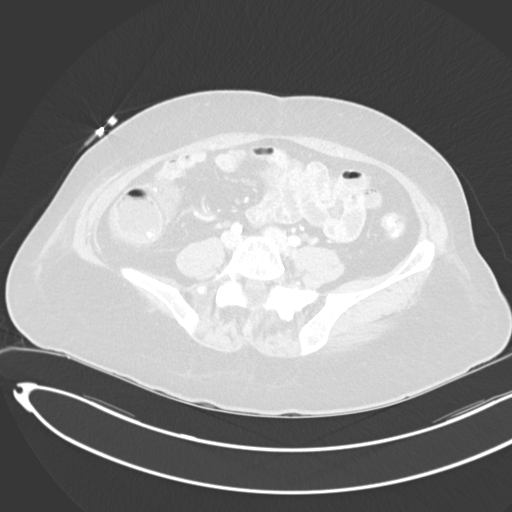
[im 57/95  soft-tissue]
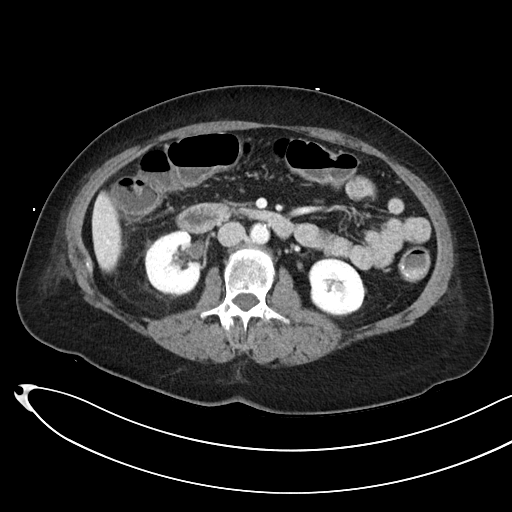
[im 57/95  lung]
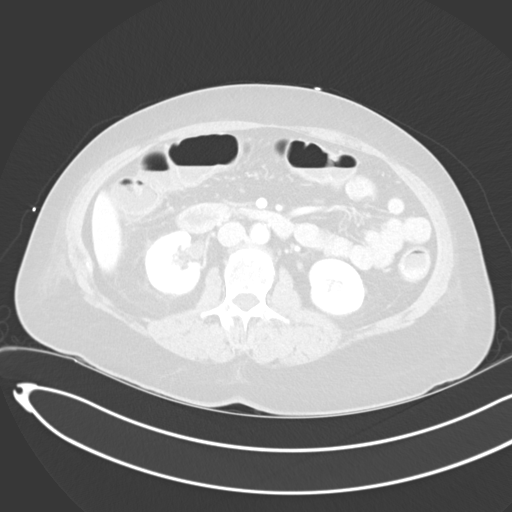
[im 76/95  soft-tissue]
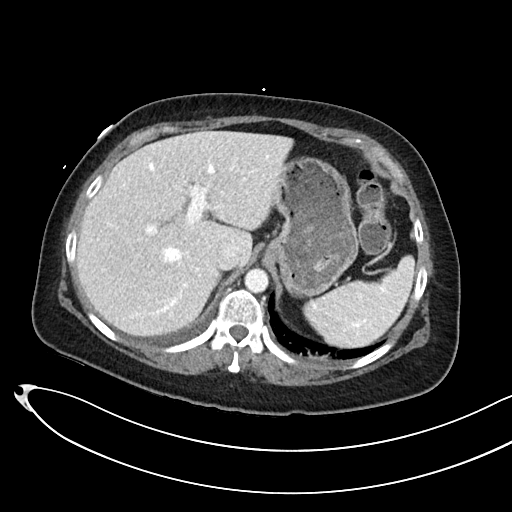
[im 76/95  lung]
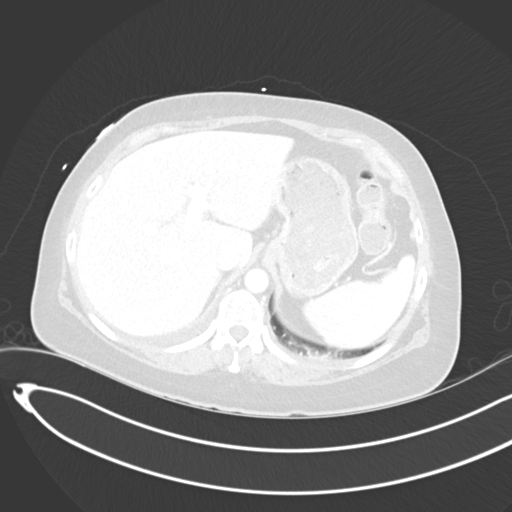

[Series 15: ap without · axial · non-contrast · 0.75mm/px · z∈[-352,-257]mm · 2 of 97 slices shown]
[im 20/97  soft-tissue]
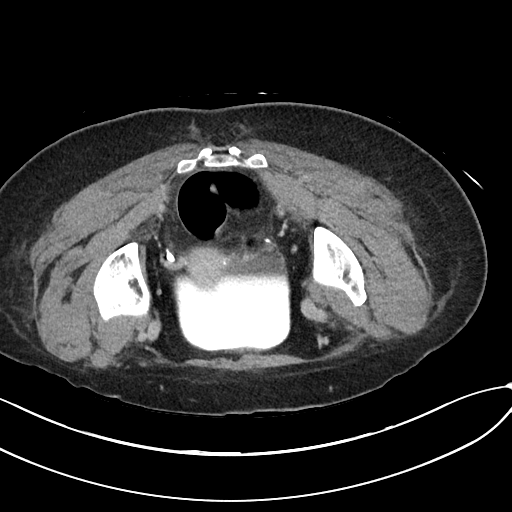
[im 39/97  soft-tissue]
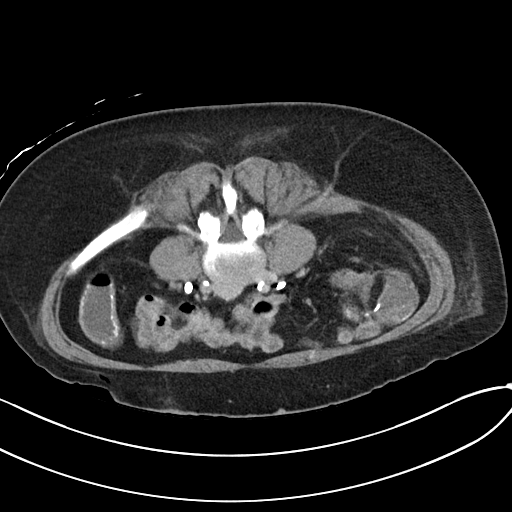

[9 of 46 positions shown; findings below may reference images not displayed]

FINDINGS: Lower chest: Small right pleural effusion

Hepatobiliary: No solid liver abnormality is seen. Excreted contrast
in the gallbladder. No gallstones, gallbladder wall thickening, or
biliary dilatation.

Pancreas: Unremarkable. No pancreatic ductal dilatation or
surrounding inflammatory changes.

Spleen: Normal in size without significant abnormality.

Adrenals/Urinary Tract: Benign, fat containing left adrenal adenoma.
There is slightly asymmetric fat and fluid stranding about the right
kidney, however there is no evidence of urinary tract calculus or
hydronephrosis. There is no evidence of contrast leak on delayed
phase imaging. Bladder is unremarkable.

Stomach/Bowel: Stomach is within normal limits. Normal appendix. No
evidence of bowel wall thickening, distention, or inflammatory
changes.

Vascular/Lymphatic: Aortic atherosclerosis. Stranding about the
right inguinal vessels and overlying soft tissues, likely related to
prior vascular access. No enlarged abdominal or pelvic lymph nodes.

Reproductive: No mass or other significant abnormality.

Other: No abdominal wall hernia or abnormality. No abdominopelvic
ascites.

Musculoskeletal: No acute or significant osseous findings.
IMPRESSION: 1. Slightly asymmetric fat and fluid stranding about the right
kidney, however there is no evidence of urinary tract calculus or
hydronephrosis. There is no evidence of contrast leak on delayed
phase imaging. Leak suspected on prior radiographs may be resolved
in the interval.
2. Small right pleural effusion.
3. Aortic atherosclerosis ([8F]-[8F]).

## 2019-10-07 MED ORDER — CLOPIDOGREL BISULFATE 75 MG PO TABS: 75.0000 mg | ORAL_TABLET | Freq: Every day | ORAL | 1 refills | Status: DC

## 2019-10-07 MED ORDER — ATORVASTATIN CALCIUM 80 MG PO TABS: 80.0000 mg | ORAL_TABLET | Freq: Every day | ORAL | 1 refills | Status: DC

## 2019-10-07 MED ORDER — IOHEXOL 300 MG/ML  SOLN
125.0000 mL | Freq: Once | INTRAMUSCULAR | Status: AC | PRN
  Administered 2019-10-07: 125 mL via INTRAVENOUS

## 2019-10-07 MED ORDER — AMANTADINE HCL 100 MG PO CAPS: 100.0000 mg | ORAL_CAPSULE | Freq: Every day | ORAL | 1 refills | Status: DC

## 2019-10-07 MED ORDER — ASPIRIN 325 MG PO TBEC: 325.0000 mg | DELAYED_RELEASE_TABLET | Freq: Every day | ORAL | 0 refills | Status: DC

## 2019-10-07 MED ORDER — MIDODRINE HCL 5 MG PO TABS: 5.0000 mg | ORAL_TABLET | Freq: Three times a day (TID) | ORAL | 1 refills | Status: DC

## 2019-10-07 NOTE — TOC Progression Note (Addendum)
Transition of Care (TOC) - Progression Note    Patient Details  Name: Erin Peck MRN: [id number] Date of Birth: 02/23/1966  Transition of Care (TOC) CM/SW Contact  Debbie Swist, RN Phone Number: 10/07/2019, 4:15 PM  Clinical Narrative:    Pt and family refusing CiR. Plan is for pt to d/c home with outpatient therapy. CM met with pt and family and they are in agreement with attending  Neurorehab. Order in Epic and information on the AVS.  Walker to be delivered to the room per [employer].  Pt has support at home and transportation to home when medically ready.   Addendum- requested [facility] to bring walker to room, family states they did not get it yesterday. MATCH letter sent to unit printer. Discussed above with charge nurse  Did not hear back [employer] rep, so I took a spare from our office to the room since the patient was ready to go.      Expected Discharge Plan: OP Rehab Barriers to Discharge: Continued Medical Work up, Inadequate or no insurance  Expected Discharge Plan and Services Expected Discharge Plan: OP Rehab   Discharge Planning Services: CM Consult Post Acute Care Choice: Durable Medical Equipment Living arrangements for the past 2 months: Single Family Home Expected Discharge Date: 10/07/19                                     Social Determinants of Health (SDOH) Interventions    Readmission Risk Interventions No flowsheet data found.  

## 2019-10-07 NOTE — Discharge Instructions (Signed)
Alteplase Treatment for Ischemic Stroke  An ischemic stroke is caused by blood clots that block blood flow to the brain. Alteplase is a medicine that can dissolve these blood clots. It can help treat a stroke if it is given very shortly after stroke symptoms begin. It is most effective when it is used within 0-4 hours after the start of symptoms. Before giving this treatment, the health care provider will carefully consider whether the treatment is appropriate based on your age, condition, and other factors. Tell a health care provider about:  Any allergies you have.  All medicines you are taking, including blood thinners, vitamins, herbs, and over-the-counter medicines.  Any medical conditions you have.  Any blood disorders you have.  Any active bleeding in the last 21 days, including gastrointestinal (GI) or vaginal bleeding.  Any surgeries or procedures you have had.  Whether you are pregnant or may be pregnant.  When your stroke symptoms started. What are the risks? Generally, this is a safe treatment. However, problems may occur, including:  Bleeding into the brain.  Bleeding in other parts of the body.  Allergic reaction. What happens before the treatment?  Your health care provider will perform a physical exam and take a detailed medical history.  Your temperature, blood pressure, heart rate, and breathing rate (vital signs) will be monitored closely. If needed, you may be given medicines to adjust your blood pressure.  You may have tests, including blood tests or imaging studies, such as a CT scan. What happens during treatment?  An IV will be inserted into one of your veins.  Alteplase will be given to you through this IV, usually over the course of 1 hour.  In some cases, this medicine may be given directly into the affected artery through a thin tube (catheter). This is usually inserted in the artery at the top of your leg.  Your vital signs and brain function  will be monitored closely as you receive this medicine.  If bleeding occurs, the medicine will be stopped and appropriate therapy will be started. The procedure may vary among health care providers and hospitals. What can I expect after treatment?  You will be monitored frequently by your health care team in an intensive care unit or a stroke unit.  You will be checked by a speech therapist, physical therapist, and an occupational therapist.  If you had a catheter, you may have bruising, soreness, and swelling at the catheter insertion site.  It may take several days, weeks, or even months to fully determine how well you responded to the alteplase treatment. Follow these instructions in the hospital: Activity  Do not get out of bed without help. This is for your safety.  Limit activity after your treatment.  Participate in stroke rehabilitation programs as told by your health care provider. General instructions  Take over-the-counter and prescription medicines only as told by your health care provider.  Tell a nurse or health care provider right away if you have any bleeding, bruising or injuries.  Use a soft-bristled toothbrush. Brush your teeth gently. Get help right away if you have:  Blood in your vomit, stool, or urine.  A serious fall or accident, or you hit your head.  Symptoms of an allergic reaction, such as a rash or difficulty breathing.  Any symptoms of a stroke. "BE FAST" is an easy way to remember the main warning signs: ? B - Balance. Signs are dizziness, sudden trouble walking, or loss of balance. ? E -  Eyes. Signs are trouble seeing or a sudden change in vision. ? F - Face. Signs are sudden weakness or numbness of the face, or the face or eyelid drooping on one side. ? A - Arms. Signs are weakness or numbness in an arm. This happens suddenly and usually on one side of the body. ? S - Speech. Signs are sudden trouble speaking, slurred speech, or trouble  understanding what people say. ? T - Time. Time to call emergency services. Write down what time symptoms started.  Other signs of stroke, such as: ? A sudden, very bad headache with no known cause. ? Nausea or vomiting. ? Seizure. These symptoms may represent a serious problem that is an emergency. Do not wait to see if the symptoms will go away. Get medical help right away. Call your local emergency services (911 in the [place] Do not drive yourself to the hospital. Summary  Alteplase is a medicine that can dissolve blood clots. It can be used to open blocked arteries during an ischemic stroke.  To be effective, this medicine must be given very shortly after stroke symptoms begin, within 4 hours after the start of symptoms.  You will be monitored closely by your health care team during and after receiving alteplase.  Get help right away if you are showing signs of bleeding or are having symptoms of stroke. This information is not intended to replace advice given to you by your health care provider. Make sure you discuss any questions you have with your health care provider. Document Revised: 01/20/2019 Document Reviewed: 01/20/2019 Elsevier Patient Education  2020 [employer]   Alteplase Treatment for Ischemic Stroke  Alteplase is a medicine that can dissolve blood clots. An ischemic stroke is caused by clots that block blood flow to the brain. Alteplase can help treat a stroke if it is given very shortly after stroke symptoms begin. Before giving this medicine, your doctor will decide if the medicine may work for you based on:  Your age.  Your condition.  Other factors. Tell your doctor about:  Any allergies you have.  All medicines you are taking.  Any medical conditions you have.  Any blood disorders you have.  Any bleeding in the last 21 days, including bleeding in the stomach or the vagina.  Any surgeries you have had.  Whether you are pregnant or may be  pregnant.  The time your symptoms started. What are the risks? Generally, this is a safe treatment. However, problems may happen, including:  Bleeding in the brain.  Bleeding in other parts of the body.  Allergic reactions to the medicine. What happens before the procedure?  Your doctor will do an exam.  Your doctor will check your body temperature, blood pressure, heart rate, and breathing.  Medicines may be given to adjust your blood pressure, if needed.  You may have tests, such as: ? Blood tests. ? A head scan (CT scan). What happens during the procedure?  An IV will be put into one of your veins.  Alteplase will be given to you through the IV.  In some cases, this medicine may be given directly to the affected area through a thin tube (catheter). This is usually put in at the top of your leg.  Your health care team will closely watch your: ? Blood pressure. ? Heart rate. ? Breathing.  Your doctor will check often to see how well the medicine is working.  If the medicine causes bleeding, it will be stopped.  Another treatment will be started. What can I expect after the treatment?  You will be watched closely in the ICU or the stroke unit.  You will be checked by several specialists, including speech therapists, physical therapists, and occupational therapists.  If you had a catheter, you may have: ? Bruising. ? Soreness. ? Swelling.  It may take days, weeks, or months to fully see how well your body responded to the treatment. Follow these instructions in the hospital: Activity  Do not get out of bed without help. This is for your safety.  Limit activity after your treatment.  Do stroke rehab programs as told by your doctor. General instructions  Take over-the-counter and prescription medicines only as told by your doctor.  Tell a nurse or doctor right away if you have any bleeding, bruising, or injuries.  Use a soft-bristled toothbrush. Brush your  teeth gently. Get help right away if you have:  Blood in your vomit, stool, or urine.  A serious fall or accident, or you hit your head.  Symptoms of an allergic reaction, such as rash or difficulty breathing.  Any signs of a stroke. "BE FAST" is an easy way to remember the main warning signs: ? B - Balance. Signs are dizziness, sudden trouble walking, or loss of balance. ? E - Eyes. Signs are trouble seeing or a sudden change in how you see. ? F - Face. Signs are sudden weakness or loss of feeling in the face, or the face or eyelid drooping on one side. ? A - Arms. Signs are weakness or loss of feeling in an arm. This happens suddenly and usually on one side of the body. ? S - Speech. Signs are sudden trouble speaking, slurred speech, or trouble understanding what people say. ? T - Time. Time to call emergency services. Write down what time symptoms started. ? Other signs of a stroke. This may include:  A sudden, very bad headache with no known cause.  Feeling like you may vomit (nausea).  Vomiting.  A seizure. These symptoms may be an emergency. Do not wait to see if the symptoms will go away. Get medical help right away. Call your local emergency services (911 in the [place] Do not drive yourself to the hospital. Summary  Alteplase is a medicine that can break up blood clots. Blood clots can cause a stroke.  This medicine may help if you get it as soon as possible after your stroke symptoms start.  You will be watched closely in the ICU or the stroke unit.  Get help right away if you are showing signs of increased bleeding or are having symptoms of stroke. This information is not intended to replace advice given to you by your health care provider. Make sure you discuss any questions you have with your health care provider. Document Revised: 01/20/2019 Document Reviewed: 01/20/2019 Elsevier Patient Education  2020 [employer].

## 2019-10-07 NOTE — Progress Notes (Addendum)
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 Intubated 3/8-3/10. No PMHx on file on admission.    PT Comments    Pt supine in bed on arrival and eager to mobilize.  Pt continues to improve functionally requiring min guard overall for gait training with exception of x1 LOB requiring moderate assistance to correct during a turn.  She continues to benefit from aggressive CIR therapies to improve strength and function but her family is declining and wishes to take her home.  Pt issued a gt belt and will require equipment listed below and neuro out patient therapies.     Follow Up Recommendations  CIR;Supervision/Assistance - 24 hour     Equipment Recommendations  Rolling walker with 5" wheels    Recommendations for Other Services Rehab consult     Precautions / Restrictions Precautions Precautions: Fall Restrictions Weight Bearing Restrictions: No    Mobility  Bed Mobility Overal bed mobility: Needs Assistance Bed Mobility: Supine to Sit     Supine to sit: Supervision     General bed mobility comments: For safety able to mobilize edge of bed.  Transfers Overall transfer level: Needs assistance Equipment used: Rolling walker (2 wheeled) Transfers: Sit to/from Stand Sit to Stand: Min guard         General transfer comment: Cues for R hand placement and to push with L hand to rise into standing.  To sit required cues to reach back with RUE.  Pt required visual cues to use R hand functionally.  Ambulation/Gait Ambulation/Gait assistance: Min guard(x1 required moderate assistance when turning and demonstrated LOB.) Gait Distance (Feet): 180 Feet Assistive device: Rolling walker (2 wheeled) Gait Pattern/deviations: Step-through pattern;Decreased step length -  right;Drifts right/left Gait velocity: decreased   General Gait Details: Pt drifting to the R side.  She required VCs to avoid obstacles to the R.  Pt required cues to push harder when progressing RW to keep pathway of RW straight.   Stairs             Wheelchair Mobility    Modified Rankin (Stroke Patients Only) Modified Rankin (Stroke Patients Only) Pre-Morbid Rankin Score: No symptoms Modified Rankin: Moderately severe disability     Balance Overall balance assessment: Needs assistance Sitting-balance support: No upper extremity supported;Feet supported Sitting balance-Leahy Scale: Fair       Standing balance-Leahy Scale: Poor Standing balance comment: Reliant on physical A                            Cognition Arousal/Alertness: Awake/alert Behavior During Therapy: Flat affect Overall Cognitive Status: Impaired/Different from baseline Area of Impairment: Safety/judgement;Problem solving;Following commands                       Following Commands: Follows one step commands consistently Safety/Judgement: Decreased awareness of safety;Decreased awareness of deficits   Problem Solving: Requires verbal cues;Slow processing General Comments: Pt continues to present with poor safety and LOB,  cues to correct and be mindful to scan environment especially on the R side.      Exercises      General Comments        Pertinent Vitals/Pain Pain Assessment: No/denies pain Faces Pain Scale: No hurt    Home Living  Prior Function            PT Goals (current goals can now be found in the care plan section) Acute Rehab PT Goals Patient Stated Goal: Get better Potential to Achieve Goals: Good Progress towards PT goals: Progressing toward goals    Frequency    Min 4X/week      PT Plan Current plan remains appropriate    Co-evaluation              AM-PAC PT "6 Clicks" Mobility   Outcome Measure   Help needed turning from your back to your side while in a flat bed without using bedrails?: A Little Help needed moving from lying on your back to sitting on the side of a flat bed without using bedrails?: A Little Help needed moving to and from a bed to a chair (including a wheelchair)?: A Little Help needed standing up from a chair using your arms (e.g., wheelchair or bedside chair)?: A Little Help needed to walk in hospital room?: A Little Help needed climbing 3-5 steps with a railing? : Total 6 Click Score: 16    End of Session Equipment Utilized During Treatment: Gait belt Activity Tolerance: Patient tolerated treatment well Patient left: in chair;with call bell/phone within reach;with chair alarm set Nurse Communication: Mobility status PT Visit Diagnosis: Hemiplegia and hemiparesis;Other abnormalities of gait and mobility (R26.89) Hemiplegia - Right/Left: Right Hemiplegia - dominant/non-dominant: Dominant Hemiplegia - caused by: Cerebral infarction     Time: 0076-2263 PT Time Calculation (min) (ACUTE ONLY): 21 min  Charges:  $Gait Training: 8-22 mins                     Erin Peck , PTA Acute Rehabilitation Services Pager [phone] Office [phone]     Erin Peck Artis Delay 10/07/2019, 2:04 PM

## 2019-10-07 NOTE — Progress Notes (Signed)
Discharge instructions gone over with patient and family present at bedside. All questions answered. Telemetry discontinued and IVs removed. Patient belongings and rolling walker sent home with patient. Patient transported off unit via wheelchair. Daughter and husband present for transport home. Melony Overly, RN

## 2019-10-08 ENCOUNTER — Encounter: Payer: Self-pay | Admitting: *Deleted

## 2019-10-08 NOTE — Anesthesia Postprocedure Evaluation (Signed)
Anesthesia Post Note  Patient: Erin Peck  Procedure(s) Performed: IR WITH ANESTHESIA (N/A )     Patient location during evaluation: SICU Anesthesia Type: General Level of consciousness: sedated Pain management: pain level controlled Vital Signs Assessment: post-procedure vital signs reviewed and stable Respiratory status: patient remains intubated per anesthesia plan Cardiovascular status: stable Postop Assessment: no apparent nausea or vomiting Anesthetic complications: no    Last Vitals:  Vitals:   10/07/19 1118 10/07/19 1606  BP: (!) 129/59 136/69  Pulse: 69 62  Resp: 18 18  Temp: 36.8 C 36.6 C  SpO2: 100% 100%    Last Pain:  Vitals:   10/07/19 1606  TempSrc: Oral  PainSc:                  Pegah Segel

## 2019-10-09 ENCOUNTER — Encounter (HOSPITAL_COMMUNITY): Payer: Self-pay | Admitting: Emergency Medicine

## 2019-10-09 ENCOUNTER — Other Ambulatory Visit: Payer: Self-pay

## 2019-10-09 ENCOUNTER — Emergency Department (HOSPITAL_COMMUNITY): Payer: Medicaid Other

## 2019-10-09 ENCOUNTER — Inpatient Hospital Stay (HOSPITAL_COMMUNITY)
Admission: EM | Admit: 2019-10-09 | Discharge: 2019-10-11 | DRG: 065 | Disposition: A | Discharge status: Rehabilitation Facility | Payer: Medicaid Other | Attending: Internal Medicine | Admitting: Internal Medicine

## 2019-10-09 ENCOUNTER — Inpatient Hospital Stay (HOSPITAL_COMMUNITY): Payer: Medicaid Other

## 2019-10-09 DIAGNOSIS — Z7902 Long term (current) use of antithrombotics/antiplatelets: Secondary | ICD-10-CM | POA: Diagnosis not present

## 2019-10-09 DIAGNOSIS — F17201 Nicotine dependence, unspecified, in remission: Secondary | ICD-10-CM

## 2019-10-09 DIAGNOSIS — G8191 Hemiplegia, unspecified affecting right dominant side: Secondary | ICD-10-CM | POA: Diagnosis present

## 2019-10-09 DIAGNOSIS — T82867A Thrombosis of cardiac prosthetic devices, implants and grafts, initial encounter: Secondary | ICD-10-CM | POA: Diagnosis not present

## 2019-10-09 DIAGNOSIS — I639 Cerebral infarction, unspecified: Secondary | ICD-10-CM | POA: Diagnosis not present

## 2019-10-09 DIAGNOSIS — Z20822 Contact with and (suspected) exposure to covid-19: Secondary | ICD-10-CM | POA: Diagnosis present

## 2019-10-09 DIAGNOSIS — R29810 Facial weakness: Secondary | ICD-10-CM | POA: Diagnosis present

## 2019-10-09 DIAGNOSIS — Z79899 Other long term (current) drug therapy: Secondary | ICD-10-CM | POA: Diagnosis not present

## 2019-10-09 DIAGNOSIS — R471 Dysarthria and anarthria: Secondary | ICD-10-CM | POA: Diagnosis present

## 2019-10-09 DIAGNOSIS — I700 Atherosclerosis of aorta: Secondary | ICD-10-CM | POA: Diagnosis present

## 2019-10-09 DIAGNOSIS — I63312 Cerebral infarction due to thrombosis of left middle cerebral artery: Secondary | ICD-10-CM | POA: Diagnosis not present

## 2019-10-09 DIAGNOSIS — E782 Mixed hyperlipidemia: Secondary | ICD-10-CM | POA: Diagnosis not present

## 2019-10-09 DIAGNOSIS — I69320 Aphasia following cerebral infarction: Secondary | ICD-10-CM

## 2019-10-09 DIAGNOSIS — Z8673 Personal history of transient ischemic attack (TIA), and cerebral infarction without residual deficits: Secondary | ICD-10-CM | POA: Diagnosis not present

## 2019-10-09 DIAGNOSIS — E785 Hyperlipidemia, unspecified: Secondary | ICD-10-CM | POA: Diagnosis present

## 2019-10-09 DIAGNOSIS — G43909 Migraine, unspecified, not intractable, without status migrainosus: Secondary | ICD-10-CM | POA: Diagnosis present

## 2019-10-09 DIAGNOSIS — Z7982 Long term (current) use of aspirin: Secondary | ICD-10-CM | POA: Diagnosis not present

## 2019-10-09 DIAGNOSIS — I63512 Cerebral infarction due to unspecified occlusion or stenosis of left middle cerebral artery: Principal | ICD-10-CM

## 2019-10-09 DIAGNOSIS — I959 Hypotension, unspecified: Secondary | ICD-10-CM | POA: Diagnosis present

## 2019-10-09 DIAGNOSIS — Z95820 Peripheral vascular angioplasty status with implants and grafts: Secondary | ICD-10-CM

## 2019-10-09 DIAGNOSIS — R4701 Aphasia: Secondary | ICD-10-CM | POA: Diagnosis present

## 2019-10-09 DIAGNOSIS — I10 Essential (primary) hypertension: Secondary | ICD-10-CM | POA: Diagnosis present

## 2019-10-09 DIAGNOSIS — I69351 Hemiplegia and hemiparesis following cerebral infarction affecting right dominant side: Secondary | ICD-10-CM | POA: Diagnosis not present

## 2019-10-09 DIAGNOSIS — Z8249 Family history of ischemic heart disease and other diseases of the circulatory system: Secondary | ICD-10-CM

## 2019-10-09 DIAGNOSIS — I9589 Other hypotension: Secondary | ICD-10-CM | POA: Diagnosis not present

## 2019-10-09 DIAGNOSIS — Z87891 Personal history of nicotine dependence: Secondary | ICD-10-CM | POA: Diagnosis not present

## 2019-10-09 DIAGNOSIS — R29712 NIHSS score 12: Secondary | ICD-10-CM | POA: Diagnosis present

## 2019-10-09 DIAGNOSIS — Z791 Long term (current) use of non-steroidal anti-inflammatories (NSAID): Secondary | ICD-10-CM

## 2019-10-09 DIAGNOSIS — Z955 Presence of coronary angioplasty implant and graft: Secondary | ICD-10-CM | POA: Diagnosis not present

## 2019-10-09 DIAGNOSIS — E876 Hypokalemia: Secondary | ICD-10-CM | POA: Diagnosis not present

## 2019-10-09 DIAGNOSIS — E7800 Pure hypercholesterolemia, unspecified: Secondary | ICD-10-CM | POA: Diagnosis not present

## 2019-10-09 LAB — RESPIRATORY PANEL BY RT PCR (FLU A&B, COVID)
Influenza A by PCR: NEGATIVE
Influenza B by PCR: NEGATIVE
SARS Coronavirus 2 by RT PCR: NEGATIVE

## 2019-10-09 LAB — DIFFERENTIAL
Abs Immature Granulocytes: 0.06 10*3/uL (ref 0.00–0.07)
Basophils Absolute: 0.1 10*3/uL (ref 0.0–0.1)
Basophils Relative: 1 %
Eosinophils Absolute: 0.2 10*3/uL (ref 0.0–0.5)
Eosinophils Relative: 2 %
Immature Granulocytes: 1 %
Lymphocytes Relative: 21 %
Lymphs Abs: 2.1 10*3/uL (ref 0.7–4.0)
Monocytes Absolute: 0.7 10*3/uL (ref 0.1–1.0)
Monocytes Relative: 7 %
Neutro Abs: 7.2 10*3/uL (ref 1.7–7.7)
Neutrophils Relative %: 68 %

## 2019-10-09 LAB — I-STAT CHEM 8, ED
BUN: 13 mg/dL (ref 6–20)
Calcium, Ion: 1.17 mmol/L (ref 1.15–1.40)
Chloride: 111 mmol/L (ref 98–111)
Creatinine, Ser: 0.8 mg/dL (ref 0.44–1.00)
Glucose, Bld: 94 mg/dL (ref 70–99)
HCT: 31 % — ABNORMAL LOW (ref 36.0–46.0)
Hemoglobin: 10.5 g/dL — ABNORMAL LOW (ref 12.0–15.0)
Potassium: 3.9 mmol/L (ref 3.5–5.1)
Sodium: 142 mmol/L (ref 135–145)
TCO2: 24 mmol/L (ref 22–32)

## 2019-10-09 LAB — CBC
HCT: 34.5 % — ABNORMAL LOW (ref 36.0–46.0)
Hemoglobin: 11.1 g/dL — ABNORMAL LOW (ref 12.0–15.0)
MCH: 30.5 pg (ref 26.0–34.0)
MCHC: 32.2 g/dL (ref 30.0–36.0)
MCV: 94.8 fL (ref 80.0–100.0)
Platelets: 362 10*3/uL (ref 150–400)
RBC: 3.64 MIL/uL — ABNORMAL LOW (ref 3.87–5.11)
RDW: 12.3 % (ref 11.5–15.5)
WBC: 10.4 10*3/uL (ref 4.0–10.5)
nRBC: 0 % (ref 0.0–0.2)

## 2019-10-09 LAB — I-STAT BETA HCG BLOOD, ED (MC, WL, AP ONLY): I-stat hCG, quantitative: 5.3 m[IU]/mL — ABNORMAL HIGH (ref <5)

## 2019-10-09 LAB — COMPREHENSIVE METABOLIC PANEL
ALT: 52 U/L — ABNORMAL HIGH (ref 0–44)
AST: 32 U/L (ref 15–41)
Albumin: 3.8 g/dL (ref 3.5–5.0)
Alkaline Phosphatase: 92 U/L (ref 38–126)
Anion gap: 10 (ref 5–15)
BUN: 13 mg/dL (ref 6–20)
CO2: 21 mmol/L — ABNORMAL LOW (ref 22–32)
Calcium: 9.2 mg/dL (ref 8.9–10.3)
Chloride: 112 mmol/L — ABNORMAL HIGH (ref 98–111)
Creatinine, Ser: 0.84 mg/dL (ref 0.44–1.00)
GFR calc Af Amer: >60 mL/min (ref >60)
GFR calc non Af Amer: >60 mL/min (ref >60)
Glucose, Bld: 99 mg/dL (ref 70–99)
Potassium: 4.1 mmol/L (ref 3.5–5.1)
Sodium: 143 mmol/L (ref 135–145)
Total Bilirubin: 0.7 mg/dL (ref 0.3–1.2)
Total Protein: 6.8 g/dL (ref 6.5–8.1)

## 2019-10-09 LAB — PROTIME-INR
INR: 0.9 (ref 0.8–1.2)
Prothrombin Time: 12.5 seconds (ref 11.4–15.2)

## 2019-10-09 LAB — CBG MONITORING, ED: Glucose-Capillary: 82 mg/dL (ref 70–99)

## 2019-10-09 LAB — APTT: aPTT: 23 seconds — ABNORMAL LOW (ref 24–36)

## 2019-10-09 LAB — PLATELET INHIBITION P2Y12: Platelet Function  P2Y12: 302 [PRU] (ref 182–335)

## 2019-10-09 IMAGING — CT CT HEAD CODE STROKE
3 series · 15 of 47 positions shown, 18 images · non-contrast
Comparison: MRI 6 days ago

CLINICAL DATA: Code stroke. Right-sided weakness and slurred speech
with right facial droop.

EXAM:
CT HEAD WITHOUT CONTRAST
TECHNIQUE: Contiguous axial images were obtained from the base of the skull
through the vertex without intravenous contrast.

[Series 3: head 5.0 h30s · axial · 0.45mm/px · z∈[-104,+26]mm · 9 of 32 slices shown, 12 images]
[im 3/32  brain]
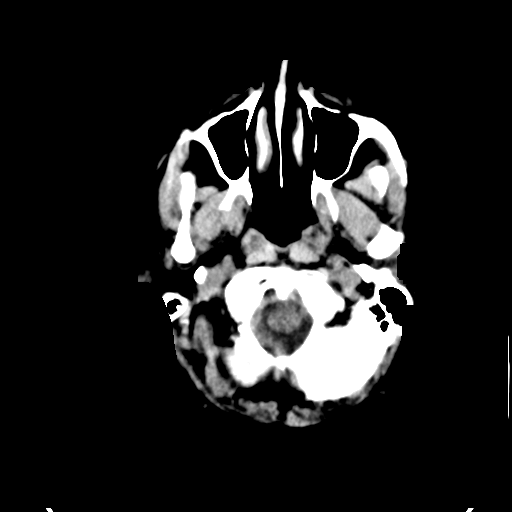
[im 3/32  bone]
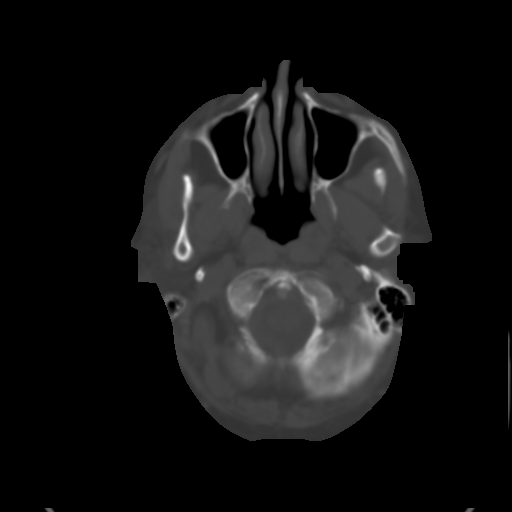
[im 6/32  brain]
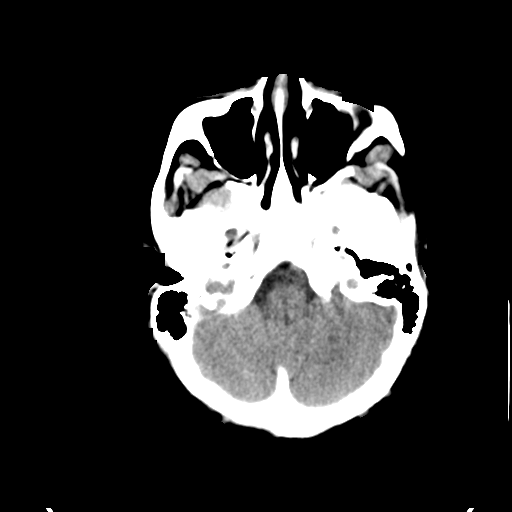
[im 9/32  brain]
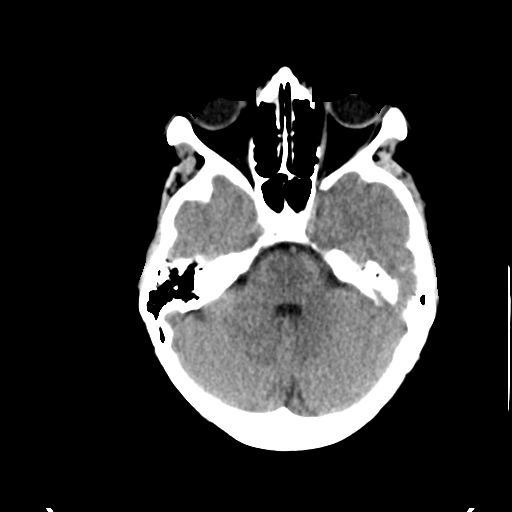
[im 12/32  brain]
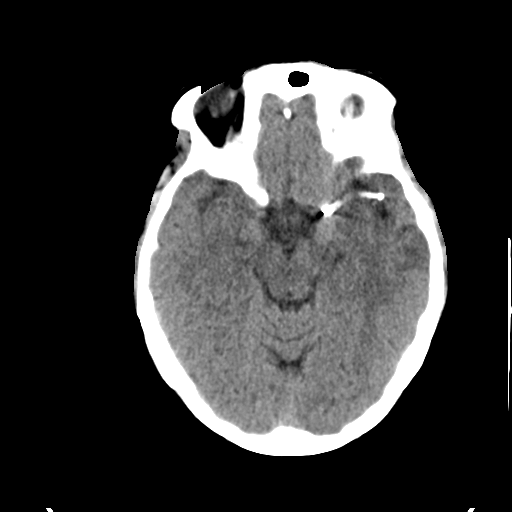
[im 17/32  brain]
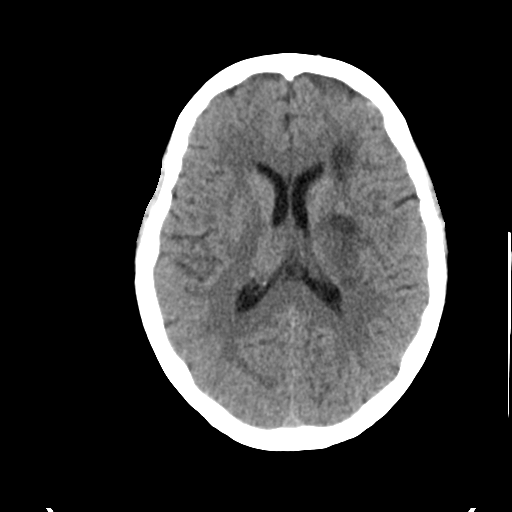
[im 17/32  bone]
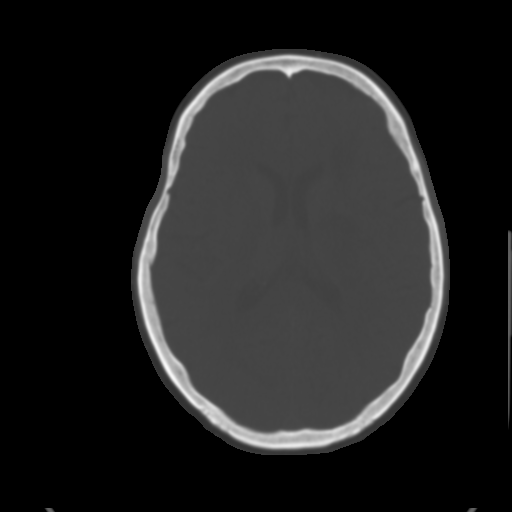
[im 20/32  brain]
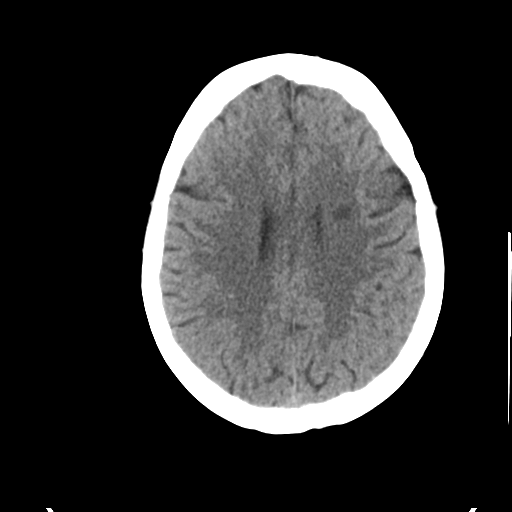
[im 23/32  brain]
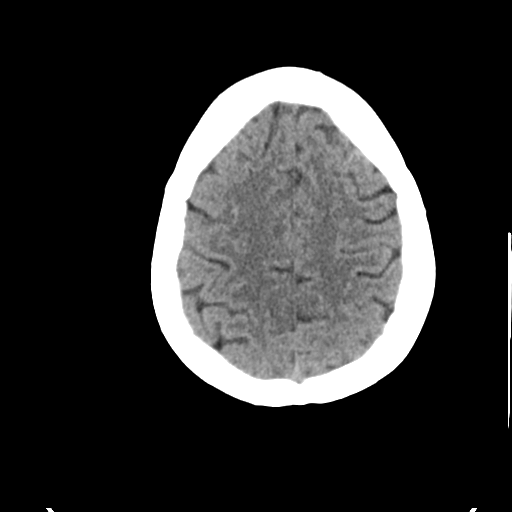
[im 26/32  brain]
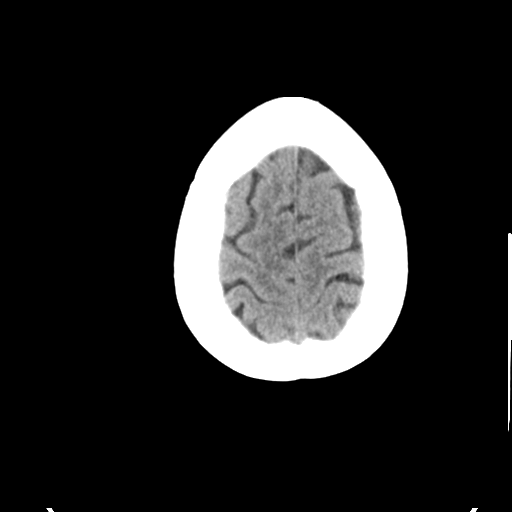
[im 29/32  brain]
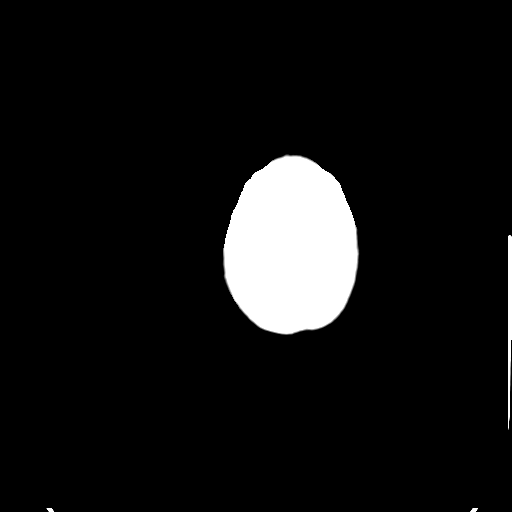
[im 29/32  bone]
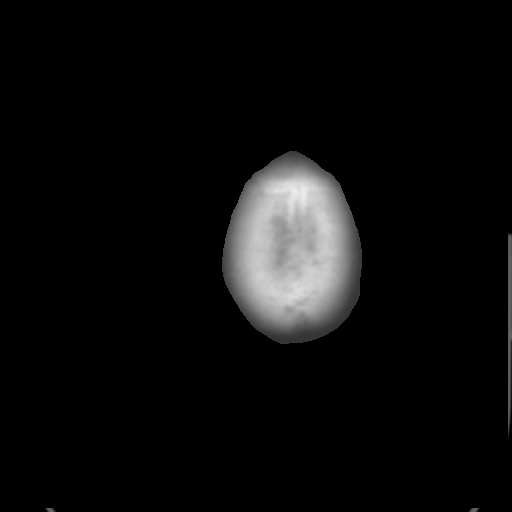

[Series 5: head 3.0 mpr cor · coronal · 0.32mm/px · 3 of 68 slices shown]
[im 23/68  brain]
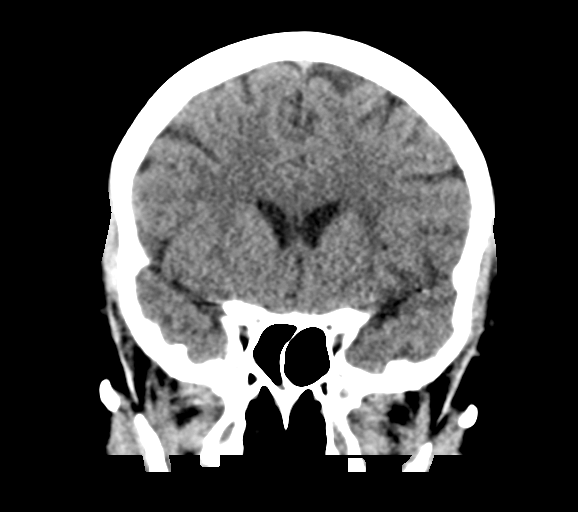
[im 30/68  brain]
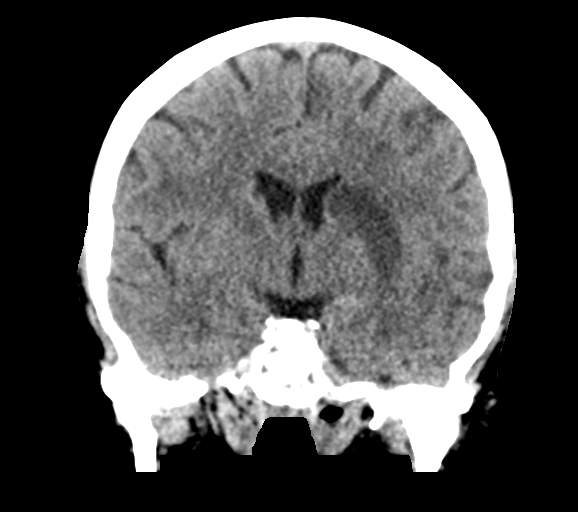
[im 38/68  brain]
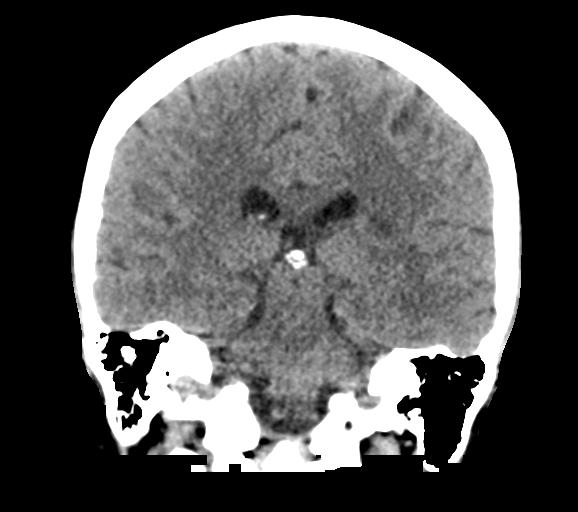

[Series 6: head 3.0 mpr sag · sagittal · 0.33mm/px · 3 of 58 slices shown]
[im 20/58  brain]
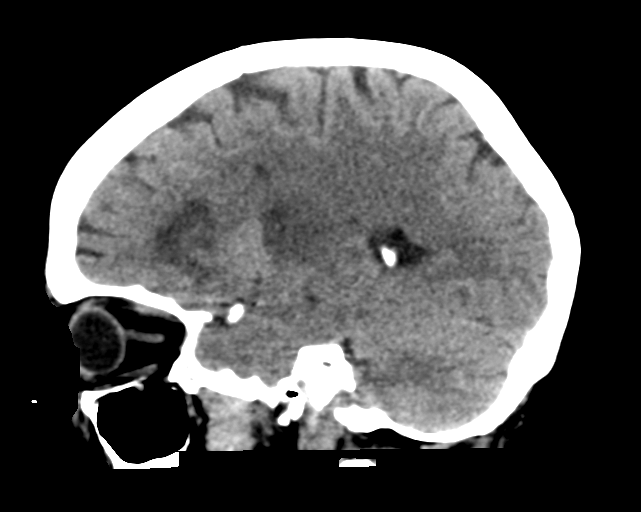
[im 29/58  brain]
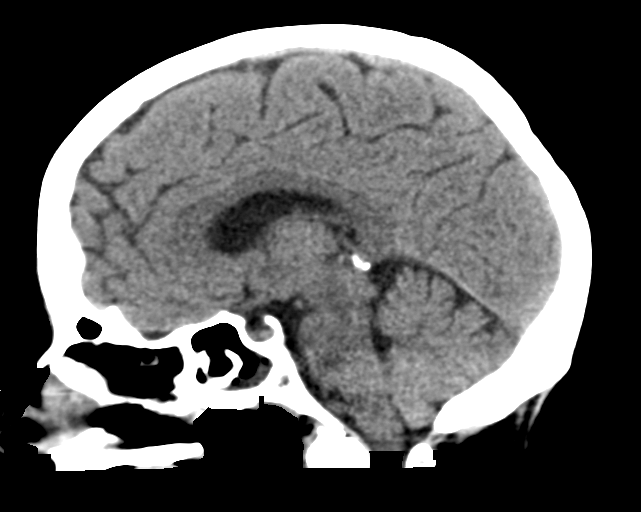
[im 39/58  brain]
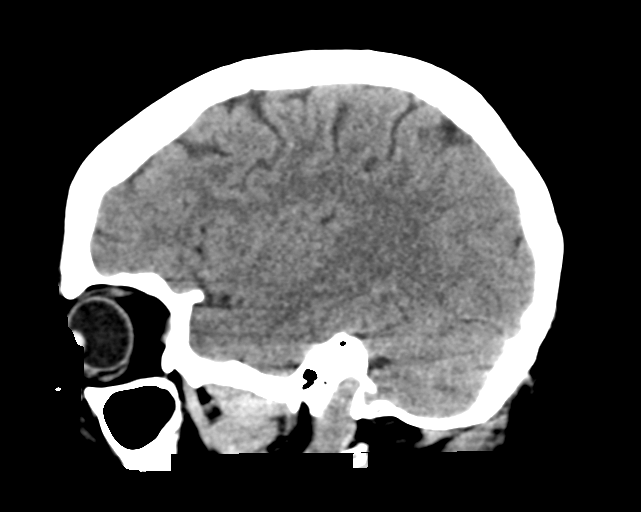

[15 of 47 positions shown; findings below may reference images not displayed]

FINDINGS: Brain: Subacute infarct in the left basal ganglia and frontal white
matter, similar in extent to interval brain MRI. There is interval
cortical infarct in the superficial and inferior left temporal lobe.
No hemorrhagic conversion or midline shift.

Vascular: Left MCA Y stent.  No hyperdense vessel seen elsewhere.

Skull: Negative

Sinuses/Orbits: Negative

Other: A call has been placed to Dr. NYA.

ASPECTS (Alberta Stroke Program Early CT Score)

Not scored in this setting
IMPRESSION: 1. Interval infarction in the left temporal cortex.
2. Pre-existing subcortical infarcts in the left MCA territory are
non progressed by CT. No hemorrhagic conversion.

## 2019-10-09 IMAGING — MR MR HEAD W/O CM
9 of 10 series · 36 of 48 positions shown · non-contrast
Comparison: MRI [DATE]

CLINICAL DATA: Stroke.

EXAM:
MRI HEAD WITHOUT CONTRAST
TECHNIQUE: Multiplanar, multiecho pulse sequences of the brain and surrounding
structures were obtained without intravenous contrast.

[Series 3: DWI · axial · 3.0mm · 1.09mm/px · z∈[-112,+41]mm · 8 of 104 slices shown (1 of 4)]
[im 1/104]
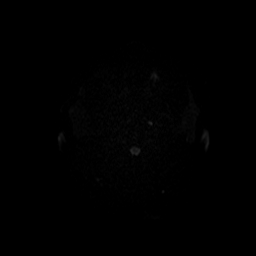
[im 12/104]
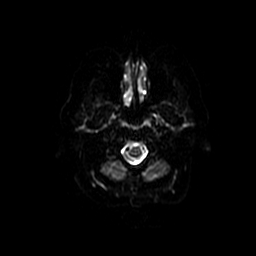
[im 35/104]
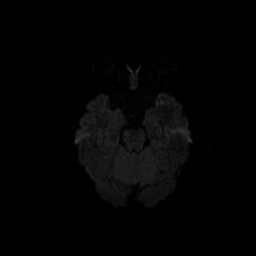
[im 46/104]
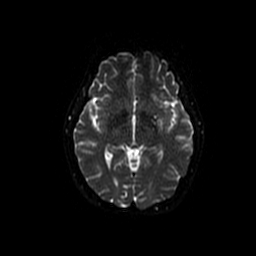
[im 58/104]
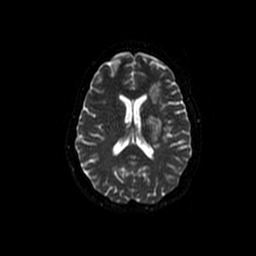
[im 69/104]
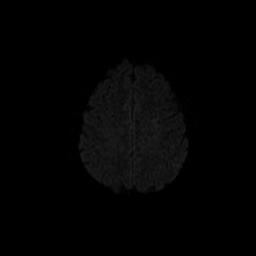
[im 92/104]
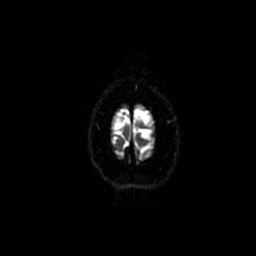
[im 104/104]
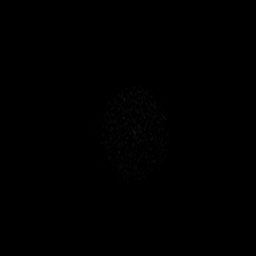

[Series 4: DWI · coronal · 5.0mm · 1.09mm/px · 7 of 72 slices shown (2 of 4)]
[im 1/72]
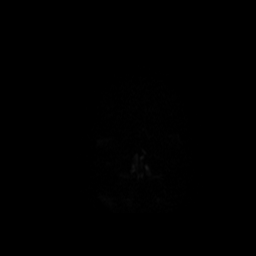
[im 12/72]
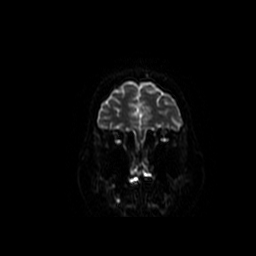
[im 24/72]
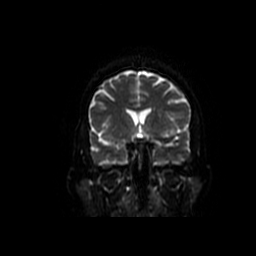
[im 36/72]
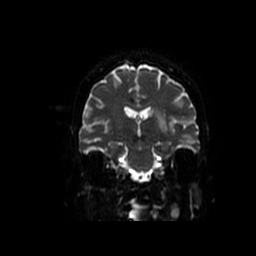
[im 48/72]
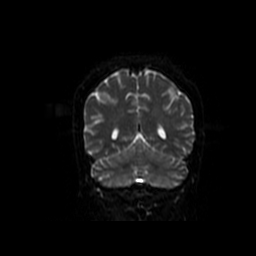
[im 60/72]
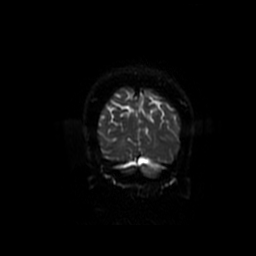
[im 72/72]
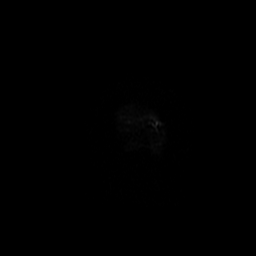

[Series 5: T1 · sagittal · 5.0mm · 0.47mm/px · 2 of 25 slices shown (1 of 2)]
[im 1/25]
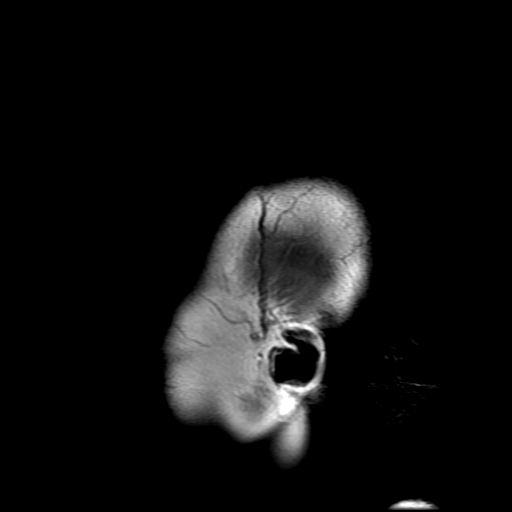
[im 25/25]
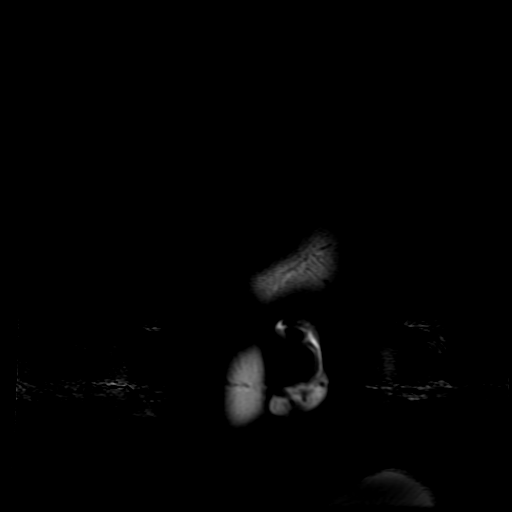

[Series 6: T2 · axial · 5.0mm · 0.43mm/px · z∈[-126,+23]mm · 3 of 26 slices shown (1 of 2)]
[im 1/26]
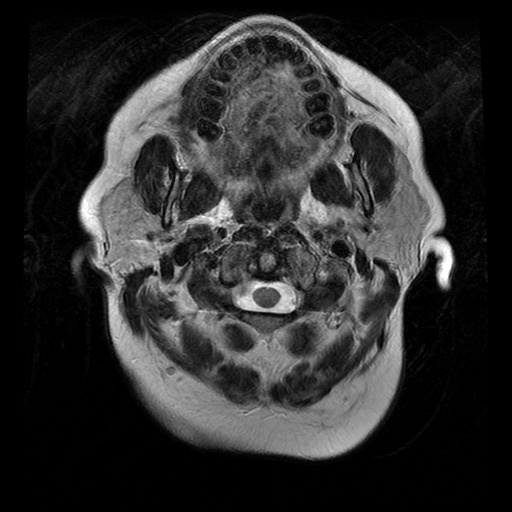
[im 13/26]
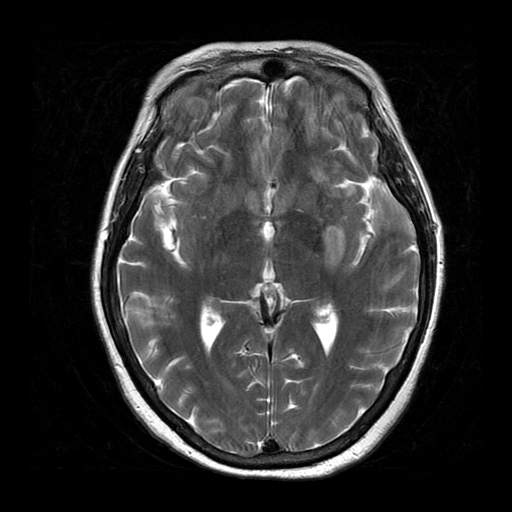
[im 26/26]
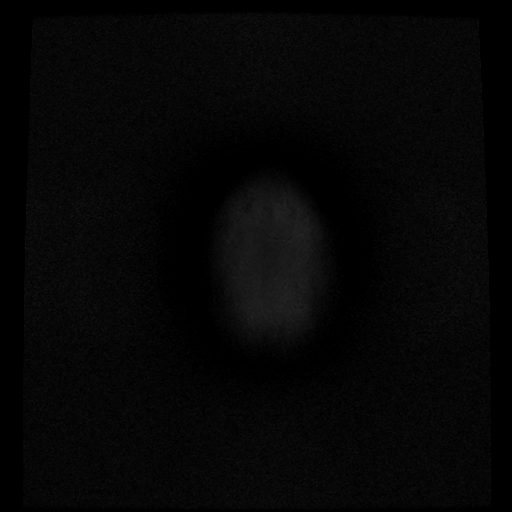

[Series 7: FLAIR · axial · 3.0mm · 0.43mm/px · z∈[-126,+17]mm · 2 of 25 slices shown]
[im 1/25]
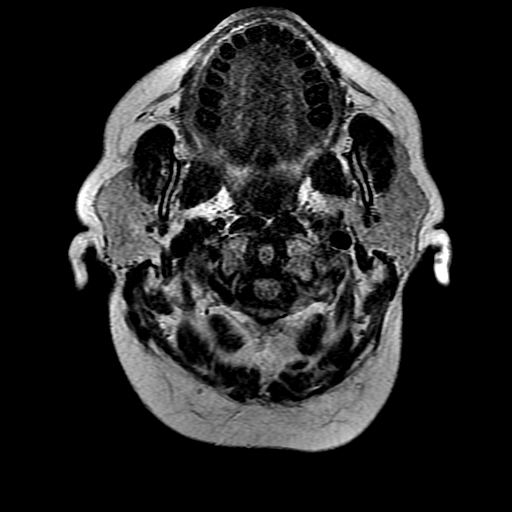
[im 25/25]
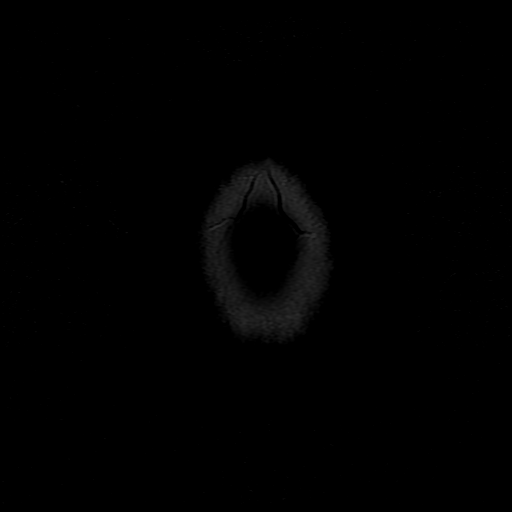

[Series 9: T1 · axial · 3.0mm · 0.47mm/px · z∈[-135,-119]mm · 2 of 100 slices shown (2 of 2)]
[im 1/100]
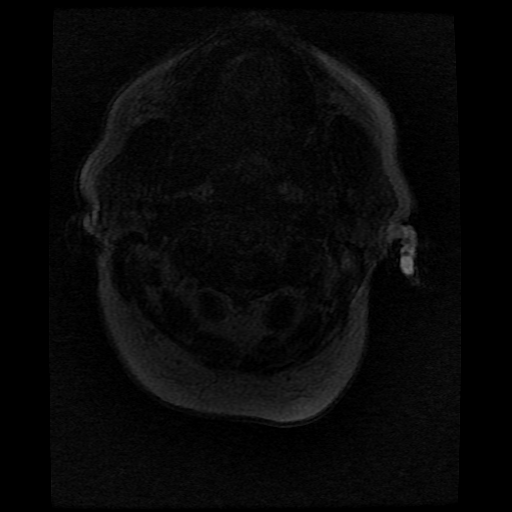
[im 12/100]
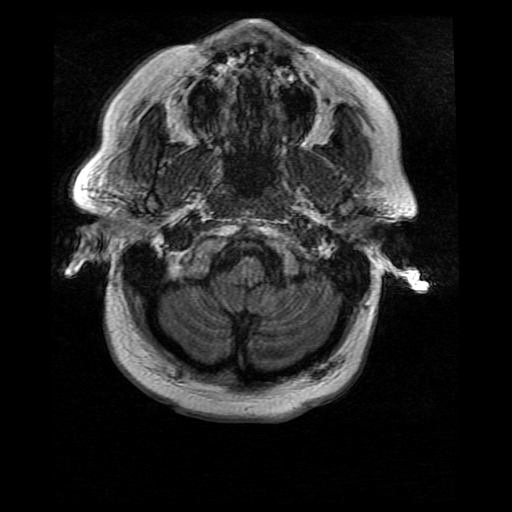

[Series 10: T2 · coronal · 5.0mm · 0.39mm/px · 3 of 28 slices shown (2 of 2)]
[im 1/28]
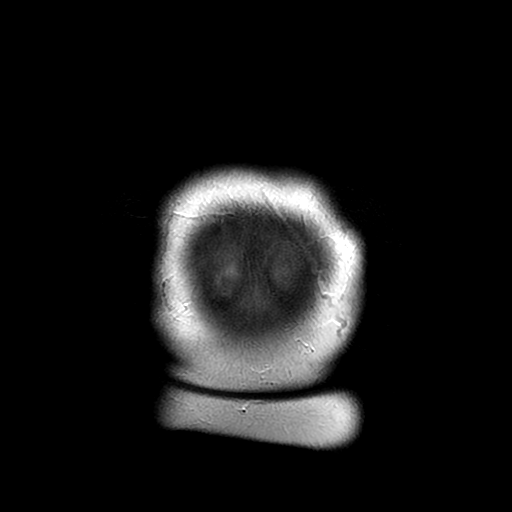
[im 14/28]
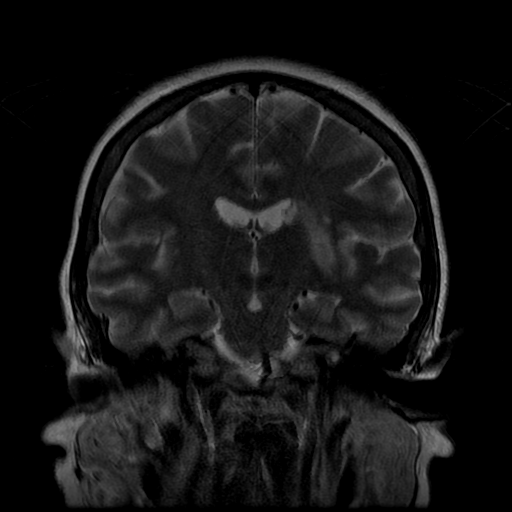
[im 28/28]
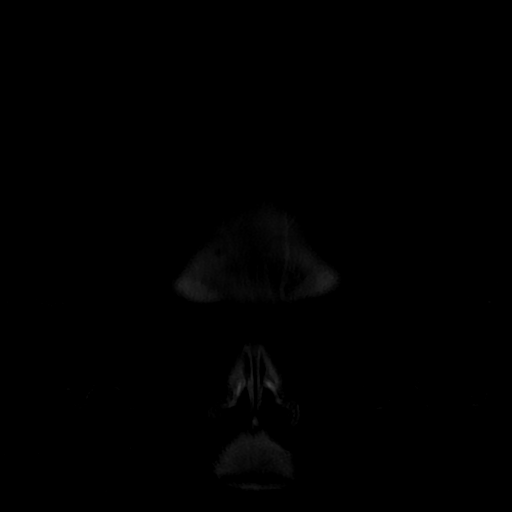

[Series 300: DWI · axial · 3.0mm · 1.09mm/px · z∈[-112,+41]mm · 5 of 52 slices shown (3 of 4)]
[im 1/52]
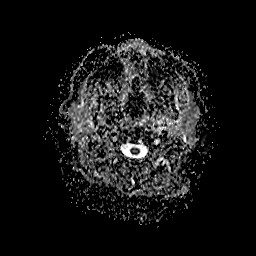
[im 13/52]
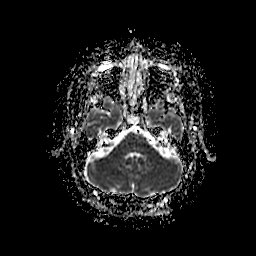
[im 26/52]
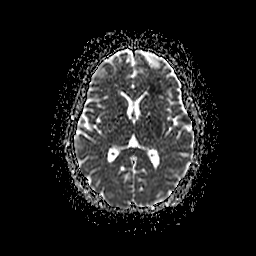
[im 39/52]
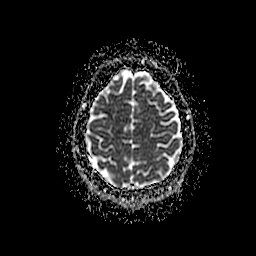
[im 52/52]
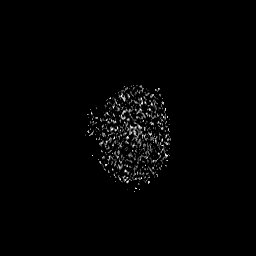

[Series 400: DWI · coronal · 5.0mm · 1.09mm/px · 4 of 36 slices shown (4 of 4)]
[im 1/36]
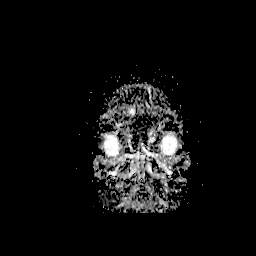
[im 12/36]
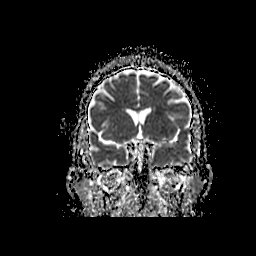
[im 24/36]
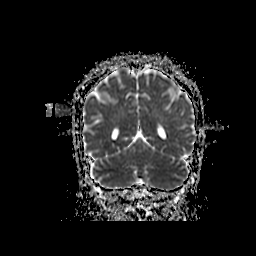
[im 36/36]
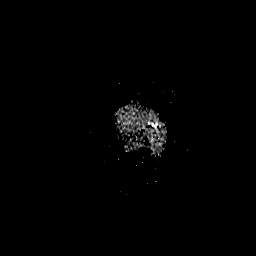

[36 of 48 positions shown; findings below may reference images not displayed]

FINDINGS: Brain: New area of acute infarct in the left superior temporal lobe.

No change in acute infarct in the left lenticular nuclei and in the
left frontal white matter. Small areas of acute infarct in the left
insula unchanged.

Ventricle size normal. Mild chronic microvascular ischemic change in
the white matter. No acute hemorrhage or mass.

Vascular: Left MCA stent noted. Increased signal on FLAIR and left
MCA branches may reflect slow flow or occlusion. CTA today
demonstrates left MCA thrombus with decreased flow

Normal arterial flow void right vertebral artery as well as in the
vertebrobasilar arteries.

Skull and upper cervical spine: No focal skeletal lesion.

Sinuses/Orbits: Mucosal edema paranasal sinuses.  Negative orbit

Other: None
IMPRESSION: Acute infarct left MCA territory. New area of acute infarct left
superior temporal lobe compared with the prior MRI [DATE].

Abnormal signal in the left MCA branches compatible with slow flow
or occlusion. See CTA result from earlier today.

No acute intracranial hemorrhage.

## 2019-10-09 MED ORDER — SODIUM CHLORIDE 0.9 % IV BOLUS
1000.0000 mL | Freq: Once | INTRAVENOUS | Status: AC
  Administered 2019-10-09: 1000 mL via INTRAVENOUS

## 2019-10-09 MED ORDER — TICAGRELOR 90 MG PO TABS
180.0000 mg | ORAL_TABLET | Freq: Once | ORAL | Status: AC
  Administered 2019-10-09: 180 mg via ORAL
  Filled 2019-10-09: qty 2

## 2019-10-09 MED ORDER — IOHEXOL 350 MG/ML SOLN
75.0000 mL | Freq: Once | INTRAVENOUS | Status: AC | PRN
  Administered 2019-10-09: 50 mL via INTRAVENOUS

## 2019-10-09 MED ORDER — TICAGRELOR 90 MG PO TABS
90.0000 mg | ORAL_TABLET | Freq: Two times a day (BID) | ORAL | Status: DC
  Administered 2019-10-09 – 2019-10-11 (×4): 90 mg via ORAL
  Filled 2019-10-09 (×4): qty 1

## 2019-10-09 MED ORDER — SODIUM CHLORIDE 0.9% FLUSH: 3.0000 mL | Freq: Once | INTRAVENOUS | Status: DC

## 2019-10-09 MED ORDER — IOHEXOL 350 MG/ML SOLN
40.0000 mL | Freq: Once | INTRAVENOUS | Status: AC | PRN
  Administered 2019-10-09: 40 mL via INTRAVENOUS

## 2019-10-09 MED ORDER — ASPIRIN 325 MG PO TABS
325.0000 mg | ORAL_TABLET | Freq: Every day | ORAL | Status: DC
  Administered 2019-10-10 – 2019-10-11 (×2): 325 mg via ORAL
  Filled 2019-10-09 (×2): qty 1

## 2019-10-09 MED ORDER — ATORVASTATIN CALCIUM 80 MG PO TABS
80.0000 mg | ORAL_TABLET | Freq: Every day | ORAL | Status: DC
  Administered 2019-10-10 – 2019-10-11 (×2): 80 mg via ORAL
  Filled 2019-10-09 (×2): qty 1

## 2019-10-09 MED ORDER — ENOXAPARIN SODIUM 40 MG/0.4ML ~~LOC~~ SOLN
40.0000 mg | SUBCUTANEOUS | Status: DC
  Administered 2019-10-09 – 2019-10-11 (×3): 40 mg via SUBCUTANEOUS
  Filled 2019-10-09 (×3): qty 0.4

## 2019-10-09 NOTE — ED Provider Notes (Signed)
Procedure note: Ultrasound Guided Peripheral IV Ultrasound guided peripheral 1.88 inch angiocath IV placement performed by me. Indications: Nursing unable to place IV. Details: The antecubital fossa and upper arm were evaluated with a multifrequency linear probe. Patent brachial veins were noted. 1 attempt was made to cannulate a vein under realtime US guidance with successful cannulation of the vein and catheter placement. There is return of non-pulsatile dark red blood. The patient tolerated the procedure well without complications. Images archived electronically.  CPT codes: 22336 and 12244    Melene Plan, DO 10/09/19 [id number]

## 2019-10-09 NOTE — ED Notes (Signed)
Report called to 3W RN.  

## 2019-10-09 NOTE — Progress Notes (Signed)
Patient arrived to unit, daughter at bedside. Pt A&Ox4, no pain. Patient verified on telemetry. Call bell in reach, bed alarm on, bed in lowest position.

## 2019-10-09 NOTE — Consult Note (Signed)
Neurology Consultation Reason for Consult: Right-sided weakness Referring Physician: Sharee Pimple  CC: Right-sided weakness  History is obtained from: Patient, chart  HPI: Erin Peck is a 54 y.o. female with recent history of cryptogenic left MCA stroke which was treated with mechanical thrombectomy, however due to reocclusion required intracranial stenting.  She was discharged 2 days ago, and at that time was able to use her right arm reasonably well, though documented at 3/5 weakness.  This morning, she states that it was "messing up."  She has significant expressive aphasia which was present on discharge as well, but she reports is worse today.   She was taken for emergent CT A/P which demonstrates some in-stent thrombosis of the inferior division of the MCA.  There is infarct present on CT, with 20 cc of ischemic range perfusion, but likely overestimating penumbra due to presence of core on CT.  I discussed findings with interventional neuroradiology, and due to the site as well as the presence of infarct already, I feel that acute intervention would not be prudent at this time.   LKW: 3/14 prior to bed tpa given?: no, recent stroke    ROS: Limited due to aphasia  Past Medical History:  Diagnosis Date  . Migraines   . Stroke (cerebrum) [facility]      Family history is limited due to aphasia   Social History:  reports that she has never smoked. She has never used smokeless tobacco. She reports previous alcohol use. She reports previous drug use.   Exam: Current vital signs: Vitals:   10/09/19 1033  BP: 129/60  Pulse: 62  Resp: 18  Temp: 98.5 F (36.9 C)  SpO2: 100%    Vital signs in last 24 hours:     Physical Exam  Constitutional: Appears well-developed and well-nourished.  Psych: Affect appropriate to situation Eyes: No scleral injection HENT: No OP obstrucion MSK: no joint deformities.  Cardiovascular: Normal rate and regular rhythm.  Respiratory:  Effort normal, non-labored breathing GI: Soft.  No distension. There is no tenderness.  Skin: WDI  Neuro: Mental Status: Patient is awake, alert, she follows commands readily, but make some word substitutions and has significant depressive aphasia as well. Cranial Nerves: II: Visual Fields are full. Pupils are equal, round, and reactive to light.   III,IV, VI: EOMI without ptosis or diploplia.  V: Facial sensation is diminished on the right VII: Facial movement with right facial weakness VIII: hearing is intact to voice X: Uvula elevates symmetrically XI: Shoulder shrug is symmetric. XII: tongue is midline without atrophy or fasciculations.  Motor: Tone is normal. Bulk is normal. 5/5 strength was present on the left, on the right she has 2-3/5 strength in the right upper extremity and 4-/5 in the right lower extremity Sensory: Sensation is diminished throughout the right side Cerebellar: Intact on left, consistent with weakness on right.    I have reviewed labs in epic and the results pertinent to this consultation are: Cr 0.8  I have reviewed the images obtained: CT head - interval temporal infarct.   Impression: 54 yo F with recent LMCA occlusion s/p stenting now with worsening deficits. She has recently been changed from brilinta to plavix.  There is a chance that she is a Plavix nonresponder, which would account for her in-stent thrombosis.  Unfortunately, there is not much we can do from an interventional standpoint.  She will need to be admitted for PT/OT/ST reevaluation and observation.  We will allow for permissive hypertension as  well.  Recommendations: 1) p2y12 inhibition level 2) loaded with Brilinta 180 once, then 90 twice daily 3) hold Plavix for now 4) continue aspirin 325 daily 5) normal saline bolus x1 6) repeat MRI overnight 7) stroke team to follow   Ritta Slot, MD [facility] [phone]  If 7pm- 7am, please page neurology on call  as listed in [facility]. 10/09/2019  11:30 AM

## 2019-10-09 NOTE — ED Notes (Signed)
Activated Code Stroke 

## 2019-10-09 NOTE — ED Provider Notes (Signed)
[facility] EMERGENCY DEPARTMENT Provider Note   CSN: [id number] Arrival date & time: 10/09/19  3300  An emergency department physician performed an initial assessment on this suspected stroke patient at [phone].  History Chief Complaint  Patient presents with  . Code Stroke    Erin Peck is a 54 y.o. female.  HPI    Patient presents as a code stroke.  Patient has gross dysarthria on arrival, inability to fully describe today's events.  Level 5 caveat secondary to acuity of condition. Initial report obtained by nursing staff, from family members. Chart review also performed. It seems as though the patient was discharged 2 days ago after admission for stroke.  Today she awoke with a headache about 3 hours prior to ED arrival, and then 2 hours later she developed worsening speech, more profound right-sided facial droop, and right-sided weakness.  Past Medical History:  Diagnosis Date  . Migraines   . Stroke (cerebrum) [facility])     Patient Active Problem List   Diagnosis Date Noted  . Hypotension 10/06/2019  . Accelerated hypertension 10/06/2019  . Hyperlipidemia 10/06/2019  . 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 Hypoglycemia 10/06/2019  . Family hx-stroke 10/06/2019  . Complicated migraine 10/06/2019  . Acute blood loss anemia 10/06/2019  . Hypokalemia 10/06/2019  . 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 Acute ischemic stroke (HCC) 10/02/2019    Past Surgical History:  Procedure Laterality Date  . BUBBLE STUDY  10/06/2019   Procedure: BUBBLE STUDY;  Surgeon: Quintella Reichert, MD;  Location: [facility] ENDOSCOPY;  Service: Cardiovascular;;  . IR CT HEAD LTD  10/03/2019  . IR INTRA CRAN STENT  10/03/2019  . IR PATIENT EVAL TECH 0-60 MINS  10/03/2019  . IR PERCUTANEOUS ART THROMBECTOMY/INFUSION INTRACRANIAL INC DIAG ANGIO  10/03/2019  . RADIOLOGY WITH ANESTHESIA N/A 10/02/2019   Procedure: IR  WITH ANESTHESIA;  Surgeon: Julieanne Cotton, MD;  Location: [facility] OR;  Service: Radiology;  Laterality: N/A;  . TEE WITHOUT CARDIOVERSION N/A 10/06/2019   Procedure: TRANSESOPHAGEAL ECHOCARDIOGRAM (TEE);  Surgeon: Quintella Reichert, MD;  Location: [facility] ENDOSCOPY;  Service: Cardiovascular;  Laterality: N/A;     OB History   No obstetric history on file.     No family history on file.  Social History   Tobacco Use  . Smoking status: Never Smoker  . Smokeless tobacco: Never Used  Substance Use Topics  . Alcohol use: Not Currently  . Drug use: Not Currently    Home Medications Prior to Admission medications   Medication Sig Start Date End Date Taking? Authorizing Provider  amantadine (SYMMETREL) 100 MG capsule Take 1 capsule (100 mg total) by mouth daily. 10/08/19   Rinehuls, Kinnie Scales, PA-C  aspirin EC 325 MG EC tablet Take 1 tablet (325 mg total) by mouth daily. 10/08/19   Rinehuls, Kinnie Scales, PA-C  atorvastatin (LIPITOR) 80 MG tablet Take 1 tablet (80 mg total) by mouth daily at 6 PM. 10/07/19   Rinehuls, Kinnie Scales, PA-C  clopidogrel (PLAVIX) 75 MG tablet Take 1 tablet (75 mg total) by mouth daily. 10/08/19   Rinehuls, Kinnie Scales, PA-C  ibuprofen (ADVIL) 200 MG tablet Take 200 mg by mouth every 6 (six) hours as needed for mild pain or moderate pain.    [provider]  midodrine (PROAMATINE) 5 MG tablet Take 1 tablet (5 mg total) by mouth 3 (three) times daily with meals. 10/07/19   Rinehuls, Kinnie Scales,  PA-C    Allergies    Patient has no known allergies.  Review of Systems   Review of Systems  Unable to perform ROS: Acuity of condition    Physical Exam Updated Vital Signs BP 129/60   Pulse 62   Temp 98.5 F (36.9 C)   Resp 18   SpO2 100%   Physical Exam Vitals and nursing note reviewed.  Constitutional:      Appearance: She is well-developed. She is ill-appearing.     Comments: Uncomfortable appearing adult female sitting upright, speaking with difficult to understand speech.   HENT:     Head:     Comments: Facial droop appreciable with loss of nasolabial fold Eyes:     Conjunctiva/sclera: Conjunctivae normal.  Cardiovascular:     Rate and Rhythm: Normal rate and regular rhythm.  Pulmonary:     Effort: Pulmonary effort is normal. No respiratory distress.     Breath sounds: Normal breath sounds. No stridor.  Abdominal:     General: There is no distension.  Skin:    General: Skin is warm and dry.  Neurological:     Mental Status: She is alert and oriented to person, place, and time.     Cranial Nerves: Cranial nerve deficit and facial asymmetry present.     Comments: Right upper extremity flaccid, right lower extremity with minimal strength against gravity. Cranial nerves abnormal with dysarthria, facial asymmetry.     ED Results / Procedures / Treatments   Labs (all labs ordered are listed, but only abnormal results are displayed) Labs Reviewed  APTT - Abnormal; Notable for the following components:      Result Value   aPTT 23 (*)    All other components within normal limits  CBC - Abnormal; Notable for the following components:   RBC 3.64 (*)    Hemoglobin 11.1 (*)    HCT 34.5 (*)    All other components within normal limits  COMPREHENSIVE METABOLIC PANEL - Abnormal; Notable for the following components:   Chloride 112 (*)    CO2 21 (*)    ALT 52 (*)    All other components within normal limits  I-STAT CHEM 8, ED - Abnormal; Notable for the following components:   Hemoglobin 10.5 (*)    HCT 31.0 (*)    All other components within normal limits  I-STAT BETA HCG BLOOD, ED ([facility], [facility], [facility] ONLY) - Abnormal; Notable for the following components:   I-stat hCG, quantitative 5.3 (*)    All other components within normal limits  RESPIRATORY PANEL BY RT PCR (FLU A&B, COVID)  PROTIME-INR  DIFFERENTIAL  PLATELET INHIBITION P2Y12  CBG MONITORING, ED    EKG EKG Interpretation  Date/Time:  Monday October 09 2019 10:37:29 EDT Ventricular Rate:  55 PR  Interval:    QRS Duration: 87 QT Interval:  452 QTC Calculation: 433 R Axis:   72 Text Interpretation: Sinus rhythm Baseline wander Otherwise within normal limits Confirmed by Gerhard Munch [phone]) on 10/09/2019 10:53:39 AM   Radiology CT ABDOMEN PELVIS W WO CONTRAST  Result Date: 10/07/2019 CLINICAL DATA:  Hematuria, rule out urinary leak EXAM: CT ABDOMEN AND PELVIS WITHOUT AND WITH CONTRAST TECHNIQUE: Multidetector CT imaging of the abdomen and pelvis was performed following the standard protocol before and following the bolus administration of intravenous contrast. CONTRAST:  OMNIPAQUE IOHEXOL 300 MG/ML  SOLN COMPARISON:  Abdominal radiographs, 10/03/2019 FINDINGS: Lower chest: Small right pleural effusion Hepatobiliary: No solid liver abnormality is seen. Excreted  contrast in the gallbladder. No gallstones, gallbladder wall thickening, or biliary dilatation. Pancreas: Unremarkable. No pancreatic ductal dilatation or surrounding inflammatory changes. Spleen: Normal in size without significant abnormality. Adrenals/Urinary Tract: Benign, fat containing left adrenal adenoma. There is slightly asymmetric fat and fluid stranding about the right kidney, however there is no evidence of urinary tract calculus or hydronephrosis. There is no evidence of contrast leak on delayed phase imaging. Bladder is unremarkable. Stomach/Bowel: Stomach is within normal limits. Normal appendix. No evidence of bowel wall thickening, distention, or inflammatory changes. Vascular/Lymphatic: Aortic atherosclerosis. Stranding about the right inguinal vessels and overlying soft tissues, likely related to prior vascular access. No enlarged abdominal or pelvic lymph nodes. Reproductive: No mass or other significant abnormality. Other: No abdominal wall hernia or abnormality. No abdominopelvic ascites. Musculoskeletal: No acute or significant osseous findings. IMPRESSION: 1. Slightly asymmetric fat and fluid stranding about  the right kidney, however there is no evidence of urinary tract calculus or hydronephrosis. There is no evidence of contrast leak on delayed phase imaging. Leak suspected on prior radiographs may be resolved in the interval. 2. Small right pleural effusion. 3. Aortic atherosclerosis (ICD10-I70.0). Electronically Signed   By: Lauralyn Primes M.D.   On: 10/07/2019 15:34   CT Code Stroke CTA Head W/WO contrast  Result Date: 10/09/2019 CLINICAL DATA:  Stroke symptoms. EXAM: CT ANGIOGRAPHY HEAD AND NECK CT PERFUSION BRAIN TECHNIQUE: Multidetector CT imaging of the head and neck was performed using the standard protocol during bolus administration of intravenous contrast. Multiplanar CT image reconstructions and MIPs were obtained to evaluate the vascular anatomy. Carotid stenosis measurements (when applicable) are obtained utilizing NASCET criteria, using the distal internal carotid diameter as the denominator. Multiphase CT imaging of the brain was performed following IV bolus contrast injection. Subsequent parametric perfusion maps were calculated using RAPID software. CONTRAST:  66mL OMNIPAQUE IOHEXOL 350 MG/ML SOLN; 68mL OMNIPAQUE IOHEXOL 350 MG/ML SOLN COMPARISON:  CTA from 5 days ago FINDINGS: CTA NECK FINDINGS Aortic arch: Atherosclerotic plaque.  Three vessel branching. Right carotid system: Calcified plaque at the ICA bulb without flow limiting stenosis or ulceration. Left carotid system: Mild calcified plaque at the bulb without flow limiting stenosis or ulceration. Vertebral arteries: No proximal subclavian stenosis. There is calcified plaque at both vertebral origins with chronic occlusion on the left. Robust flow in the dominant right vertebral artery. There is reconstitution of the left vertebral artery at the V3 segment. Skeleton: Negative Other neck: Negative Upper chest: Centrilobular emphysema. Trace pleural fluid on the right. Review of the MIP images confirms the above findings CTA HEAD FINDINGS  Anterior circulation: Y stent in the left MCA. The upper limb showed in stent thrombosis on prior with poor subsequent the flow, unchanged. The lower limb is newly affected with the same appearance and new poor downstream flow. No contralateral embolus is seen. Posterior circulation: Strong dominance of the right vertebral artery. The vertebrobasilar arteries are smooth and widely patent. No branch occlusion or aneurysm Venous sinuses: Patent Anatomic variants: Negative Review of the MIP images confirms the above findings Case discussed with Dr. Amada Jupiter while in progress. CT Brain Perfusion Findings: CBF (<30%) Volume: 47mL Perfusion (Tmax>6.0s) volume: 11mL IMPRESSION: 1. Recent Y stenting of the left M1 and proximal M2 vessels. There is new in stent narrowing/thrombosis of the lower limb with downstream underfilling. In stent thrombosis of the upper limb persists. 2. Underestimated core infarct with pseudonormalization at the level of the subacute left basal ganglia infarcts and the interval left temporal lobe  infarct. There is 20 cc of ischemic range perfusion in the left temporal lobe and deep white matter which is more extensive than the noncontrast CT abnormality, probable true penumbra. 3. No new site of embolic disease. 4. 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 Electronically Signed   By: Marnee Spring M.D.   On: 10/09/2019 10:20   CT Code Stroke CTA Neck W/WO contrast  Result Date: 10/09/2019 CLINICAL DATA:  Stroke symptoms. EXAM: CT ANGIOGRAPHY HEAD AND NECK CT PERFUSION BRAIN TECHNIQUE: Multidetector CT imaging of the head and neck was performed using the standard protocol during bolus administration of intravenous contrast. Multiplanar CT image reconstructions and MIPs were obtained to evaluate the vascular anatomy. Carotid stenosis measurements (when applicable) are obtained utilizing NASCET criteria, using the distal internal carotid diameter as  the denominator. Multiphase CT imaging of the brain was performed following IV bolus contrast injection. Subsequent parametric perfusion maps were calculated using RAPID software. CONTRAST:  35mL OMNIPAQUE IOHEXOL 350 MG/ML SOLN; 80mL OMNIPAQUE IOHEXOL 350 MG/ML SOLN COMPARISON:  CTA from 5 days ago FINDINGS: CTA NECK FINDINGS Aortic arch: Atherosclerotic plaque.  Three vessel branching. Right carotid system: Calcified plaque at the ICA bulb without flow limiting stenosis or ulceration. Left carotid system: Mild calcified plaque at the bulb without flow limiting stenosis or ulceration. Vertebral arteries: No proximal subclavian stenosis. There is calcified plaque at both vertebral origins with chronic occlusion on the left. Robust flow in the dominant right vertebral artery. There is reconstitution of the left vertebral artery at the V3 segment. Skeleton: Negative Other neck: Negative Upper chest: Centrilobular emphysema. Trace pleural fluid on the right. Review of the MIP images confirms the above findings CTA HEAD FINDINGS Anterior circulation: Y stent in the left MCA. The upper limb showed in stent thrombosis on prior with poor subsequent the flow, unchanged. The lower limb is newly affected with the same appearance and new poor downstream flow. No contralateral embolus is seen. Posterior circulation: Strong dominance of the right vertebral artery. The vertebrobasilar arteries are smooth and widely patent. No branch occlusion or aneurysm Venous sinuses: Patent Anatomic variants: Negative Review of the MIP images confirms the above findings Case discussed with Dr. Amada Jupiter while in progress. CT Brain Perfusion Findings: CBF (<30%) Volume: 40mL Perfusion (Tmax>6.0s) volume: 83mL IMPRESSION: 1. Recent Y stenting of the left M1 and proximal M2 vessels. There is new in stent narrowing/thrombosis of the lower limb with downstream underfilling. In stent thrombosis of the upper limb persists. 2. Underestimated core  infarct with pseudonormalization at the level of the subacute left basal ganglia infarcts and the interval left temporal lobe infarct. There is 20 cc of ischemic range perfusion in the left temporal lobe and deep white matter which is more extensive than the noncontrast CT abnormality, probable true penumbra. 3. No new site of embolic disease. 4. 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 Electronically Signed   By: Marnee Spring M.D.   On: 10/09/2019 10:20   CT CEREBRAL PERFUSION W CONTRAST  Result Date: 10/09/2019 CLINICAL DATA:  Stroke symptoms. EXAM: CT ANGIOGRAPHY HEAD AND NECK CT PERFUSION BRAIN TECHNIQUE: Multidetector CT imaging of the head and neck was performed using the standard protocol during bolus administration of intravenous contrast. Multiplanar CT image reconstructions and MIPs were obtained to evaluate the vascular anatomy. Carotid stenosis measurements (when applicable) are obtained utilizing NASCET criteria, using the distal internal carotid diameter as the denominator. Multiphase CT imaging of  the brain was performed following IV bolus contrast injection. Subsequent parametric perfusion maps were calculated using RAPID software. CONTRAST:  9mL OMNIPAQUE IOHEXOL 350 MG/ML SOLN; 53mL OMNIPAQUE IOHEXOL 350 MG/ML SOLN COMPARISON:  CTA from 5 days ago FINDINGS: CTA NECK FINDINGS Aortic arch: Atherosclerotic plaque.  Three vessel branching. Right carotid system: Calcified plaque at the ICA bulb without flow limiting stenosis or ulceration. Left carotid system: Mild calcified plaque at the bulb without flow limiting stenosis or ulceration. Vertebral arteries: No proximal subclavian stenosis. There is calcified plaque at both vertebral origins with chronic occlusion on the left. Robust flow in the dominant right vertebral artery. There is reconstitution of the left vertebral artery at the V3 segment. Skeleton: Negative Other neck: Negative Upper  chest: Centrilobular emphysema. Trace pleural fluid on the right. Review of the MIP images confirms the above findings CTA HEAD FINDINGS Anterior circulation: Y stent in the left MCA. The upper limb showed in stent thrombosis on prior with poor subsequent the flow, unchanged. The lower limb is newly affected with the same appearance and new poor downstream flow. No contralateral embolus is seen. Posterior circulation: Strong dominance of the right vertebral artery. The vertebrobasilar arteries are smooth and widely patent. No branch occlusion or aneurysm Venous sinuses: Patent Anatomic variants: Negative Review of the MIP images confirms the above findings Case discussed with Dr. Leonel Ramsay while in progress. CT Brain Perfusion Findings: CBF (<30%) Volume: 51mL Perfusion (Tmax>6.0s) volume: 55mL IMPRESSION: 1. Recent Y stenting of the left M1 and proximal M2 vessels. There is new in stent narrowing/thrombosis of the lower limb with downstream underfilling. In stent thrombosis of the upper limb persists. 2. Underestimated core infarct with pseudonormalization at the level of the subacute left basal ganglia infarcts and the interval left temporal lobe infarct. There is 20 cc of ischemic range perfusion in the left temporal lobe and deep white matter which is more extensive than the noncontrast CT abnormality, probable true penumbra. 3. No new site of embolic disease. 4. 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 Electronically Signed   By: Monte Fantasia M.D.   On: 10/09/2019 10:20   CT HEAD CODE STROKE WO CONTRAST  Result Date: 10/09/2019 CLINICAL DATA:  Code stroke. Right-sided weakness and slurred speech with right facial droop. EXAM: CT HEAD WITHOUT CONTRAST TECHNIQUE: Contiguous axial images were obtained from the base of the skull through the vertex without intravenous contrast. COMPARISON:  MRI 6 days ago FINDINGS: Brain: Subacute infarct in the left basal  ganglia and frontal white matter, similar in extent to interval brain MRI. There is interval cortical infarct in the superficial and inferior left temporal lobe. No hemorrhagic conversion or midline shift. Vascular: Left MCA Y stent.  No hyperdense vessel seen elsewhere. Skull: Negative Sinuses/Orbits: Negative Other: A call has been placed to Dr. Leonel Ramsay. ASPECTS [facility] Stroke Program Early CT Score) Not scored in this setting IMPRESSION: 1. Interval infarction in the left temporal cortex. 2. 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 No hemorrhagic conversion. Electronically Signed   By: Monte Fantasia M.D.   On: 10/09/2019 09:42    Procedures Procedures (including critical care time)  Medications Ordered in ED Medications  sodium chloride flush (NS) 0.9 % injection 3 mL (3 mLs Intravenous Not Given 10/09/19 1037)  ticagrelor (BRILINTA) tablet 180 mg (has no administration in time range)  sodium chloride 0.9 % bolus 1,000 mL (has no administration in time range)  iohexol (  OMNIPAQUE) 350 MG/ML injection 75 mL (50 mLs Intravenous Contrast Given 10/09/19 0941)  iohexol (OMNIPAQUE) 350 MG/ML injection 40 mL (40 mLs Intravenous Contrast Given 10/09/19 1014)    ED Course  I have reviewed the triage vital signs and the nursing notes.  Pertinent labs & imaging results that were available during my care of the patient were reviewed by me and considered in my medical decision making (see chart for details).     With concern for stroke versus stent occlusion in this female with recent discharge following stroke evaluation she had emergent head CT.  On reviewing his initial result patient seems to have developed interval infarction in the left temporal cortex.   10:53 AM Patient in similar condition.  Initial CT results concerning for in-stent thrombosis, with new area of infarct.  I discussed the patient's case with our neurology colleagues.  This  adult female presents after recent admission for stroke requiring interventional stent placement, thrombectomy, now with recurrence/worsening of right-sided weakness and dysarthria.  Patient found to have evidence for acute new infarct, stent thrombosis.  Some suspicion for patient not responding to previously prescribed, appropriate, antiplatelet medication.  Given this latter concern, and the former, she did require admission, multiple conversations with our neurology colleagues for appropriate therapy.  Risk benefit profile not favorable for return to interventional radiology.   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

## 2019-10-09 NOTE — Code Documentation (Signed)
54 yo female coming from home with her husband where she was noted at her post-hospital baseline last night at 2230. Pt went to bed with some mild weakness of the right arm. Upon waking this morning, pt had slurred speech, trouble speaking, significant right arm weakness, and right leg weakness. Husband drove patient to the hospital and Code Stroke was called in triage.   Stroke Team met patient in CT. Initial NIHSS 8 - right arm and leg weakness, moderate aphasia, dysarthria, and right facial droop. CT Head completed. CTA ordered. IV access attempted x4 (two RNs) - no success. EDP Tyrone Nine called and came over to start [place] IV. Successful and CTA/CTP completed.   Not a tPA candidate due to being outside the window. Not an IR candidate after discussion with Interventional Radiologist. Pt brought back to ED. Placed on cardiac monitor.   Care/Plan: P2Y12 ordered. Q2 VS and mNIHSS. Pt to be admitted.

## 2019-10-09 NOTE — ED Notes (Addendum)
Patient's daughter at bedside asking about the reason for current treatment decision. This RN updated patient and daughter on plan of care for admission but advised I could have physician come and explain test results and plan of care decision. Dr. Jeraldine Loots made aware of patient/daughter's questions and advised he would be in to talk with them soon.

## 2019-10-09 NOTE — ED Notes (Signed)
Dr. Amada Jupiter at bedside to discuss plan of care with patient.

## 2019-10-09 NOTE — ED Notes (Signed)
Dr.Lockwood at bedside  

## 2019-10-09 NOTE — H&P (Addendum)
Date: 10/09/2019               Patient Name:  Erin Peck MRN: [id number]  DOB: [birth year] Age / Sex: 54 y.o., female   PCP: Patient, No Pcp Per         Medical Service: Internal Medicine Teaching Service         Attending Physician: Dr. Gust Rung, DO    First Contact: Dr. Yetta Barre Pager: [phone]  Second Contact: Dr. Cleaster Corin  Pager: [phone]       After Hours (After 5p/  First Contact Pager: [phone]  weekends / holidays): Second Contact Pager: [phone]   Chief Complaint: dysarthria, weakness  History of Present Illness:   Erin Peck is a 54yo female with recent discharge LMCA stroke s/p tPA and stent placement presenting after waking up this morning with aphasia and right arm and leg weakness.  History is partially provided by her daughter due to patient's aphasia. Mrs. Profitt woke up this morning with a head ache and went to the restroom and had some changes in speech. She was given her medicine by her husband and was fine but when they tried to get her up a little later her right foot wouldn't move and they called EMS, then realized she could not move her right arm.  Now in the hospital she is moving her right leg more but her hand still isn't moving at all.  Prior to her recent stroke she had no known PMH and had not seen a physician in a long time. She had stent placed in LMCA superior and inferior division with occlusion of superior branch which was revascularized with integrelin and treated with aggrastat.  She was discharged on aspirin 325 mg qd, plavix 75 mg qd, atorvastatin, midodrine, and amantadine. She was taking these at home and last took her medications this morning. At time of her recent discharge she was able to lift both arms and legs, ambulate, and speak normally. She denies palpitations, chest pain, shortness of breath, numbness, loss of bowels, dysuria, abdominal pain. Her headache has now resolved.   In the ED code stroke was called and neurology  was consulted. CTA showed new stent narrowing/thrombosis of lower limb of MCA stent. She was loaded with brilinta 180 mg and bolused 1L NS.    Social:   She lives at home with her husband.  She stopped smoking after this most recent stroke.  She drinks socially.   Family History:   Father: heart surgery Mom: Heart disease 2/2 congenital defect  Meds:  Current Meds  Medication Sig  . amantadine (SYMMETREL) 100 MG capsule Take 1 capsule (100 mg total) by mouth daily.  Marland Kitchen aspirin EC 325 MG EC tablet Take 1 tablet (325 mg total) by mouth daily.  Marland Kitchen atorvastatin (LIPITOR) 80 MG tablet Take 1 tablet (80 mg total) by mouth daily at 6 PM.  . clopidogrel (PLAVIX) 75 MG tablet Take 1 tablet (75 mg total) by mouth daily.  Marland Kitchen ibuprofen (ADVIL) 200 MG tablet Take 200 mg by mouth every 6 (six) hours as needed for mild pain or moderate pain.  . midodrine (PROAMATINE) 5 MG tablet Take 1 tablet (5 mg total) by mouth 3 (three) times daily with meals.     Allergies: Allergies as of 10/09/2019  . (No Known Allergies)   Past Medical History:  Diagnosis Date  . Migraines   . Stroke (cerebrum) [facility])      Review of Systems:  A complete ROS was negative except as per HPI.   Physical Exam: Blood pressure (!) 142/65, pulse (!) 59, temperature 98.5 F (36.9 C), resp. rate (!) 21, SpO2 100 %.  Constitution: NAD, supine in bed HEENT: eom intact, PERRLA Cardio: RRR, no m/r/g, no JVD, no LE edema  Respiratory: CTA, no w/r/r  Abdominal: NTTP, soft, non-distended MSK: RUE strength 0/5, LUE strength 5/5; RLE strength 4/5, LLE strength 5/5. Sensation intact  Neuro: CN VII: dysarthria with facial droop on right. CN XI: Unable to shrug right shoulder.  CN II-XII otherwise intact Skin: c/d/i    EKG: personally reviewed my interpretation is normal sinus rhythm.    CT Cerebral Perfusion W/Contrast 10/06/19 IMPRESSION: 1. Recent Y stenting of the left M1 and proximal M2 vessels. There is new in stent  narrowing/thrombosis of the lower limb with downstream underfilling. In stent thrombosis of the upper limb persists. 2. Underestimated core infarct with pseudonormalization at the level of the subacute left basal ganglia infarcts and the interval left temporal lobe infarct. There is 20 cc of ischemic range perfusion in the left temporal lobe and deep white matter which is more extensive than the noncontrast CT abnormality, probable true penumbra. 3. No new site of embolic disease. 4. 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  CT Head wo contrast 3/15 1. Interval infarction in the left temporal cortex. 2. 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 No hemorrhagic conversion.  Assessment & Plan by Problem: Active Problems:   CVA (cerebral vascular accident) [facility])   54yo female with recent left MCA stroke one week ago treated with tPA and stenting, discharged 3/13, now presenting with worsening right sided deficits and dysarthria.   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 Superior branch stenosed during that admission and required revascularization. Initial infarct 2/2 unknown source at that time. LE Doppler, 2D Echo, TEE all without acute findings. CTA w/ 50% right sided stenoses only of ICA & VA. EP cardiology was consulted for possible loop recorder in the future. Discharged with asa & plavix. Stroke today thought to possibly be due to her not responding to plavix w/CT showing new stenosis of inferior limb of stent. P2y12 inhibition level is elevated, suggesting possibly decreased platelet inhibition. Neurology consulted and recommend medical therapy at this point as she is not candidate for further intervention.   - PT/OT/SLP - NPO pending SLP - stop plavix - loaded with brilinta 180 once, start 90 mg bid  tomorrow  - asa 325 mg qd  - repeat MRI pending - permissive hypertension - s/p 1L  - amantadine prescribed at last d/c for aphasia, hold for now as can lower bp  - cont. Atorvastatin 80  Hypotension Required midodrine for hypotension during last admission after stent placement. Hold midodrine for now.   Diet: NPO pending swallow eval VTE: lovenox IVF: s/p 1L Code: full   Dispo: Admit patient to Inpatient with expected length of stay greater than 2 midnights.  SignedMarty Heck, DO 10/09/2019, 1:48 PM  Pager: [phone]

## 2019-10-09 NOTE — ED Notes (Signed)
Patient transported to MRI 

## 2019-10-09 NOTE — ED Triage Notes (Signed)
Pt arrives via pov from home. Just discharged on Saturday after CVA. Woke up this am with headache around 5, at 8am noticed R sided weakness, slurred speech and r facial droop. Pt a/o upon arrival with significant slurring of words, R arm and leg weakness, and R facial droop.    Dr Jeraldine Loots assessed patient at bridge and patient taken immediately to CT for code stroke protocol.

## 2019-10-09 NOTE — ED Notes (Signed)
Daughter Deloria Lair would like an update [phone]

## 2019-10-10 DIAGNOSIS — G8191 Hemiplegia, unspecified affecting right dominant side: Secondary | ICD-10-CM

## 2019-10-10 DIAGNOSIS — I63312 Cerebral infarction due to thrombosis of left middle cerebral artery: Secondary | ICD-10-CM

## 2019-10-10 DIAGNOSIS — I700 Atherosclerosis of aorta: Secondary | ICD-10-CM | POA: Diagnosis present

## 2019-10-10 DIAGNOSIS — I9589 Other hypotension: Secondary | ICD-10-CM

## 2019-10-10 DIAGNOSIS — T82867A Thrombosis of cardiac prosthetic devices, implants and grafts, initial encounter: Secondary | ICD-10-CM

## 2019-10-10 DIAGNOSIS — Z8673 Personal history of transient ischemic attack (TIA), and cerebral infarction without residual deficits: Secondary | ICD-10-CM

## 2019-10-10 DIAGNOSIS — E782 Mixed hyperlipidemia: Secondary | ICD-10-CM

## 2019-10-10 DIAGNOSIS — Z7982 Long term (current) use of aspirin: Secondary | ICD-10-CM

## 2019-10-10 DIAGNOSIS — E876 Hypokalemia: Secondary | ICD-10-CM

## 2019-10-10 DIAGNOSIS — E7800 Pure hypercholesterolemia, unspecified: Secondary | ICD-10-CM

## 2019-10-10 DIAGNOSIS — Z955 Presence of coronary angioplasty implant and graft: Secondary | ICD-10-CM

## 2019-10-10 DIAGNOSIS — Z79899 Other long term (current) drug therapy: Secondary | ICD-10-CM

## 2019-10-10 DIAGNOSIS — Z7902 Long term (current) use of antithrombotics/antiplatelets: Secondary | ICD-10-CM

## 2019-10-10 LAB — BASIC METABOLIC PANEL
Anion gap: 13 (ref 5–15)
BUN: 12 mg/dL (ref 6–20)
CO2: 21 mmol/L — ABNORMAL LOW (ref 22–32)
Calcium: 9 mg/dL (ref 8.9–10.3)
Chloride: 108 mmol/L (ref 98–111)
Creatinine, Ser: 0.84 mg/dL (ref 0.44–1.00)
GFR calc Af Amer: >60 mL/min (ref >60)
GFR calc non Af Amer: >60 mL/min (ref >60)
Glucose, Bld: 80 mg/dL (ref 70–99)
Potassium: 3.3 mmol/L — ABNORMAL LOW (ref 3.5–5.1)
Sodium: 142 mmol/L (ref 135–145)

## 2019-10-10 LAB — CBC
HCT: 33 % — ABNORMAL LOW (ref 36.0–46.0)
Hemoglobin: 11.1 g/dL — ABNORMAL LOW (ref 12.0–15.0)
MCH: 31.1 pg (ref 26.0–34.0)
MCHC: 33.6 g/dL (ref 30.0–36.0)
MCV: 92.4 fL (ref 80.0–100.0)
Platelets: 400 10*3/uL (ref 150–400)
RBC: 3.57 MIL/uL — ABNORMAL LOW (ref 3.87–5.11)
RDW: 12.5 % (ref 11.5–15.5)
WBC: 8.9 10*3/uL (ref 4.0–10.5)
nRBC: 0 % (ref 0.0–0.2)

## 2019-10-10 MED ORDER — AMANTADINE HCL 100 MG PO CAPS
100.0000 mg | ORAL_CAPSULE | Freq: Every day | ORAL | Status: DC
  Administered 2019-10-10 – 2019-10-11 (×2): 100 mg via ORAL
  Filled 2019-10-10 (×2): qty 1

## 2019-10-10 MED ORDER — MIDODRINE HCL 5 MG PO TABS
5.0000 mg | ORAL_TABLET | Freq: Three times a day (TID) | ORAL | Status: DC
  Administered 2019-10-10 – 2019-10-11 (×2): 5 mg via ORAL
  Filled 2019-10-10 (×2): qty 1

## 2019-10-10 MED ORDER — POTASSIUM CHLORIDE CRYS ER 20 MEQ PO TBCR
40.0000 meq | EXTENDED_RELEASE_TABLET | Freq: Once | ORAL | Status: AC
  Administered 2019-10-10: 40 meq via ORAL
  Filled 2019-10-10: qty 2

## 2019-10-10 NOTE — Evaluation (Signed)
Physical Therapy Evaluation Patient Details Name: Erin Peck MRN: [id number] DOB: [birth year] Today's Date: 10/10/2019   History of Present Illness  Erin Peck is a 54yo female with recent discharge LMCA stroke s/p tPA and stent placement presenting after waking up this morning with aphasia and right arm and leg weakness. New area of acute infarct left superior temporal lobe compared with the prior MRI 10/03/2019.  Clinical Impression  Pt admitted with above diagnosis. Pt appropriately emotional on eval about decreased function of her R side. Pt required mod A +2 for sit<>stand and max A +2 for ambulation 6' and 8'. Pt R knee buckling with RLE scissoring and R foot in inversion. Would benefit greatly from intense rehab environment before d/c home.  Pt currently with functional limitations due to the deficits listed below (see PT Problem List). Pt will benefit from skilled PT to increase their independence and safety with mobility to allow discharge to the venue listed below.       Follow Up Recommendations CIR;Supervision/Assistance - 24 hour    Equipment Recommendations  Other (comment)(TBD)    Recommendations for Other Services Rehab consult     Precautions / Restrictions Precautions Precautions: Fall Restrictions Weight Bearing Restrictions: No      Mobility  Bed Mobility               General bed mobility comments: pt received in chair  Transfers Overall transfer level: Needs assistance Equipment used: 2 person hand held assist Transfers: Sit to/from Stand Sit to Stand: Mod assist;+2 physical assistance         General transfer comment: mod A +2 for power up and R sided support due to buckling of R knee  Ambulation/Gait Ambulation/Gait assistance: Max assist;+2 physical assistance Gait Distance (Feet): 14 Feet(6', 8') Assistive device: 2 person hand held assist Gait Pattern/deviations: Step-to pattern;Decreased weight shift to right;Decreased step  length - right;Scissoring;Decreased dorsiflexion - right Gait velocity: decreased Gait velocity interpretation: <1.31 ft/sec, indicative of household ambulator General Gait Details: scissoring RLE with foot strike in inversion and R knee buckling in stance.   Stairs            Wheelchair Mobility    Modified Rankin (Stroke Patients Only) Modified Rankin (Stroke Patients Only) Pre-Morbid Rankin Score: Moderately severe disability Modified Rankin: Moderately severe disability     Balance Overall balance assessment: Needs assistance Sitting-balance support: No upper extremity supported;Feet supported Sitting balance-Leahy Scale: Fair     Standing balance support: Bilateral upper extremity supported;During functional activity Standing balance-Leahy Scale: Poor Standing balance comment: reliant on external support                             Pertinent Vitals/Pain Pain Assessment: No/denies pain    Home Living Family/patient expects to be discharged to:: Private residence Living Arrangements: Spouse/significant other Available Help at Discharge: Family;Available 24 hours/day Type of Home: House Home Access: Stairs to enter   [employer] of Steps: 1 Home Layout: One level Home Equipment: Walker - 2 wheels      Prior Function Level of Independence: Independent         Comments: independent prior to first CVA last week. Was keeping her grandkids. Was ambulatory when she went home last week and had use of RUE     Hand Dominance   Dominant Hand: Right    Extremity/Trunk Assessment   Upper Extremity Assessment Upper Extremity Assessment: Defer to OT evaluation  Lower Extremity Assessment Lower Extremity Assessment: RLE deficits/detail RLE Deficits / Details: pt unable to df R ankle, moves into inversion when she attempts df. Hip flex 2-/5. R knee buckling in [place].  RLE Sensation: decreased proprioception RLE Coordination: decreased  fine motor;decreased gross motor LLE Deficits / Details: WFL       Communication   Communication: Expressive difficulties  Cognition Arousal/Alertness: Awake/alert Behavior During Therapy: Flat affect Overall Cognitive Status: Impaired/Different from baseline Area of Impairment: Safety/judgement;Problem solving;Following commands                   Current Attention Level: Sustained   Following Commands: Follows one step commands consistently Safety/Judgement: Decreased awareness of safety;Decreased awareness of deficits Awareness: Intellectual Problem Solving: Requires verbal cues;Slow processing General Comments: pt appropriately emotional about worsening of deficits      General Comments General comments (skin integrity, edema, etc.): husband and daughter present    Exercises     Assessment/Plan    PT Assessment Patient needs continued PT services  PT Problem List Decreased strength;Decreased balance;Decreased mobility;Decreased cognition;Decreased knowledge of use of DME;Decreased safety awareness;Impaired tone;Obesity       PT Treatment Interventions DME instruction;Gait training;Functional mobility training;Therapeutic activities;Balance training;Neuromuscular re-education;Cognitive remediation;Patient/family education    PT Goals (Current goals can be found in the Care Plan section)  Acute Rehab PT Goals Patient Stated Goal: Get better PT Goal Formulation: With patient Time For Goal Achievement: 10/24/19 Potential to Achieve Goals: Good    Frequency Min 4X/week   Barriers to discharge        Co-evaluation PT/OT/SLP Co-Evaluation/Treatment: Yes Reason for Co-Treatment: Complexity of the patient's impairments (multi-system involvement);Necessary to address cognition/behavior during functional activity;For patient/therapist safety PT goals addressed during session: Mobility/safety with mobility;Balance         AM-PAC PT "6 Clicks" Mobility   Outcome Measure Help needed turning from your back to your side while in a flat bed without using bedrails?: A Little Help needed moving from lying on your back to sitting on the side of a flat bed without using bedrails?: A Little Help needed moving to and from a bed to a chair (including a wheelchair)?: A Lot Help needed standing up from a chair using your arms (e.g., wheelchair or bedside chair)?: A Lot Help needed to walk in hospital room?: A Lot Help needed climbing 3-5 steps with a railing? : Total 6 Click Score: 13    End of Session Equipment Utilized During Treatment: Gait belt Activity Tolerance: Patient tolerated treatment well Patient left: in chair;with call bell/phone within reach;with chair alarm set;with family/visitor present Nurse Communication: Mobility status PT Visit Diagnosis: Hemiplegia and hemiparesis;Other abnormalities of gait and mobility (R26.89);Unsteadiness on feet (R26.81) Hemiplegia - Right/Left: Right Hemiplegia - dominant/non-dominant: Dominant Hemiplegia - caused by: Cerebral infarction    Time: 1110-1138 PT Time Calculation (min) (ACUTE ONLY): 28 min   Charges:   PT Evaluation $PT Eval Moderate Complexity: [place]  Pager [phone] Office [place] 10/10/2019, 2:11 PM

## 2019-10-10 NOTE — Plan of Care (Signed)

## 2019-10-10 NOTE — Evaluation (Signed)
Clinical/Bedside Swallow Evaluation Patient Details  Name: Erin Peck MRN: [id number] Date of Birth: June 27, 1966  Today's Date: 10/10/2019 Time: SLP Start Time (ACUTE ONLY): 1140 SLP Stop Time (ACUTE ONLY): 1157 SLP Time Calculation (min) (ACUTE ONLY): 17 min  Past Medical History:  Past Medical History:  Diagnosis Date  . Migraines   . Stroke (cerebrum) [facility])    Past Surgical History:  Past Surgical History:  Procedure Laterality Date  . BUBBLE STUDY  10/06/2019   Procedure: BUBBLE STUDY;  Surgeon: Sueanne Margarita, MD;  Location: [place];  Service: Cardiovascular;;  . IR CT HEAD LTD  10/03/2019  . IR INTRA CRAN STENT  10/03/2019  . IR PATIENT EVAL TECH 0-60 MINS  10/03/2019  . IR PERCUTANEOUS ART THROMBECTOMY/INFUSION INTRACRANIAL INC DIAG ANGIO  10/03/2019  . RADIOLOGY WITH ANESTHESIA N/A 10/02/2019   Procedure: IR WITH ANESTHESIA;  Surgeon: Luanne Bras, MD;  Location: [place];  Service: Radiology;  Laterality: N/A;  . TEE WITHOUT CARDIOVERSION N/A 10/06/2019   Procedure: TRANSESOPHAGEAL ECHOCARDIOGRAM (TEE);  Surgeon: Sueanne Margarita, MD;  Location: [facility] ENDOSCOPY;  Service: Cardiovascular;  Laterality: N/A;   HPI:  Erin Peck is a 54yo female with recent discharge LMCA stroke s/p tPA and stent placement presenting after waking up this morning with aphasia and right arm and leg weakness. Pt was seen for a BSE on 10/04/19 and a MBS on 10/05/19 with recommendations for Dysphagia 3 solids and thin liquids via straw sip. MRI on 3/15 reported: "Acute infarct left MCA territory. New area of acute infarct left superior temporal lobe compared with the prior MRI 10/03/2019."   Assessment / Plan / Recommendation Clinical Impression  Pt was sen for a bedside swallow evaluation and she presents with suspected functional oropharyngeal swallowing abilities.  Pt and family were able to independently state recommendations from [facility] on 10/05/19 and reported that the pt was tolerating regular  solids and thin liquids at home without difficulty.  Pt consumed trials of thin liquid, puree, and regular solids.  She exhibited good bolus acceptance, timely mastication, and suspected timely AP transport and swallow initiation with all trials.  No overt s/sx were observed with any trials despite challenging.  Discussed diet change to Dysphagia 3 (soft/pre-cut) solids due to UE weakness to help with self-feeding; however, pt reported that she would prefer to remain on regular solids and family stated that they would be present for most meals to assist with meal preparation.  Therefore, recommend continuation of regular solids and thin liquids with medications administered whole with thin liquid.  No further skilled ST is warranted for dysphagia at this time.  Please re-consult if additional needs arise.   SLP Visit Diagnosis: Dysphagia, unspecified (R13.10)    Aspiration Risk  Mild aspiration risk    Diet Recommendation Regular;Thin liquid   Liquid Administration via: Straw Medication Administration: Whole meds with liquid Supervision: Intermittent supervision to cue for compensatory strategies;Patient able to self feed Compensations: Slow rate;Small sips/bites Postural Changes: Seated upright at 90 degrees    Other  Recommendations Oral Care Recommendations: Oral care BID   Follow up Recommendations None(for dysphagia )        Swallow Study   General Date of Onset: 10/09/19 HPI: Erin Peck is a 54yo female with recent discharge LMCA stroke s/p tPA and stent placement presenting after waking up this morning with aphasia and right arm and leg weakness. Pt was seen for a BSE on 10/04/19 and a MBS on 10/05/19 with recommendations  for Dysphagia 3 solids and thin liquids via straw sip. MRI on 3/15 reported: "Acute infarct left MCA territory. New area of acute infarct left superior temporal lobe compared with the prior MRI 10/03/2019.   Type of Study: Bedside Swallow Evaluation Previous  Swallow Assessment: See HPI  Diet Prior to this Study: Regular;Thin liquids Temperature Spikes Noted: No Respiratory Status: Room air History of Recent Intubation: No Behavior/Cognition: Cooperative;Alert;Pleasant mood Oral Cavity Assessment: Within Functional Limits Oral Care Completed by SLP: No Oral Cavity - Dentition: Adequate natural dentition Vision: Functional for self-feeding Self-Feeding Abilities: Able to feed self;Needs set up Patient Positioning: Upright in bed Baseline Vocal Quality: Normal Volitional Swallow: Able to elicit    Oral/Motor/Sensory Function Overall Oral Motor/Sensory Function: Mild impairment Facial ROM: Reduced right Facial Symmetry: Within Functional Limits Facial Sensation: Within Functional Limits Lingual ROM: Reduced right Lingual Symmetry: Abnormal symmetry right Lingual Strength: Reduced   Ice Chips Ice chips: Within functional limits Presentation: Spoon   Thin Liquid Thin Liquid: Within functional limits Presentation: Cup;Straw    Nectar Thick Nectar Thick Liquid: Not tested   Honey Thick Honey Thick Liquid: Not tested   Puree Puree: Within functional limits Presentation: Self Fed   Solid     Solid: Within functional limits Presentation: Self Fed     Villa Herb., M.S., CCC-SLP Acute Rehabilitation Services Office: [phone]  Shanon Rosser Haydin Calandra 10/10/2019,12:10 PM

## 2019-10-10 NOTE — Evaluation (Addendum)
Occupational Therapy Evaluation Patient Details Name: CORLISS COGGESHALL MRN: [id number] DOB: 10/05/1965 Today's Date: 10/10/2019    History of Present Illness Starlene D. Zuba is a 54yo female with recent discharge LMCA stroke s/p tPA and stent placement presenting after waking up this morning with aphasia and right arm and leg weakness. New area of acute infarct left superior temporal lobe compared with the prior MRI 10/03/2019.   Clinical Impression   This 54 y/o female presents with the above. Pt with recent admit and d/c home, prior to initial admit was independent with ADL and functional mobility. Pt now presenting with worsened R side weakness including dominant RUE, impaired standing balance, cognition and overall functional performance. Pt tolerating room level mobility today though requiring maxA+2 (HHA) for completion, requiring seated and standing rest break intermittently during completion. She currently requires maxA (+2) for LB and and modA for UB ADL. Pt with speech/cognitive impairments requiring increased time/effort to answer some questions. She will benefit from continued acute OT services and currently feel she is an excellent candidate for CIR level therapies at time of discharge. Will continue to follow acutely.     Follow Up Recommendations  CIR;Supervision/Assistance - 24 hour    Equipment Recommendations  Other (comment);3 in 1 bedside commode(TBD)    Recommendations for Other Services Rehab consult     Precautions / Restrictions Precautions Precautions: Fall Restrictions Weight Bearing Restrictions: No      Mobility Bed Mobility               General bed mobility comments: pt received in chair  Transfers Overall transfer level: Needs assistance Equipment used: 2 person hand held assist Transfers: Sit to/from Stand Sit to Stand: Mod assist;+2 physical assistance         General transfer comment: mod A +2 for power up and R sided support due to  buckling of R knee    Balance Overall balance assessment: Needs assistance Sitting-balance support: No upper extremity supported;Feet supported Sitting balance-Leahy Scale: Fair     Standing balance support: Bilateral upper extremity supported;During functional activity Standing balance-Leahy Scale: Poor Standing balance comment: reliant on external support                           ADL either performed or assessed with clinical judgement   ADL Overall ADL's : Needs assistance/impaired Eating/Feeding: Minimal assistance;Sitting   Grooming: Minimal assistance;Sitting   Upper Body Bathing: Moderate assistance;Sitting   Lower Body Bathing: Maximal assistance;+2 for physical assistance;Sit to/from stand   Upper Body Dressing : Moderate assistance;Sitting   Lower Body Dressing: Maximal assistance;+2 for physical assistance;Sit to/from stand Lower Body Dressing Details (indicate cue type and reason): pt able to utilize figure 4 and using L hand to don socks, requires assist/support to maintain LE in figure 4 position especially RLE Toilet Transfer: Maximal assistance;+2 for physical assistance;Ambulation Toilet Transfer Details (indicate cue type and reason): simulated via room level mobility, to/from recliner with seated rest in between Toileting- Clothing Manipulation and Hygiene: Maximal assistance;+2 for physical assistance;Sitting/lateral lean;Sit to/from stand       Functional mobility during ADLs: Maximal assistance;+2 for physical assistance(HHA) General ADL Comments: pt with notable R side weakness, impaired cognition, decreased standing balacne and functional performance      Vision   Additional Comments: will need to further assess     Perception     Praxis      Pertinent Vitals/Pain Pain Assessment: No/denies pain  Hand Dominance Right   Extremity/Trunk Assessment Upper Extremity Assessment Upper Extremity Assessment: RUE deficits/detail RUE  Deficits / Details: pt with shoulder shrug but no other muscle activation noted with ROM  RUE Sensation: decreased light touch;decreased proprioception RUE Coordination: decreased fine motor;decreased gross motor   Lower Extremity Assessment Lower Extremity Assessment: Defer to PT evaluation RLE Deficits / Details: pt unable to df R ankle, moves into inversion when she attempts df. Hip flex 2-/5. R knee buckling in WB'ing.  RLE Sensation: decreased proprioception RLE Coordination: decreased fine motor;decreased gross motor LLE Deficits / Details: WFL       Communication Communication Communication: Expressive difficulties   Cognition Arousal/Alertness: Awake/alert Behavior During Therapy: Flat affect Overall Cognitive Status: Impaired/Different from baseline Area of Impairment: Safety/judgement;Problem solving;Following commands                   Current Attention Level: Sustained   Following Commands: Follows one step commands consistently Safety/Judgement: Decreased awareness of safety;Decreased awareness of deficits Awareness: Intellectual Problem Solving: Requires verbal cues;Slow processing General Comments: pt appropriately emotional about worsening of deficits   General Comments  husband and daughter present    Exercises     Shoulder Instructions      Home Living Family/patient expects to be discharged to:: Private residence Living Arrangements: Spouse/significant other Available Help at Discharge: Family;Available 24 hours/day Type of Home: House Home Access: Stairs to enter [employer] of Steps: 1   Home Layout: One level     Bathroom Shower/Tub: Tub/shower unit;Door   [employer] Toilet: Standard     Home Equipment: Environmental consultant - 2 wheels;Tub bench      Lives With: Spouse    Prior Functioning/Environment Level of Independence: Independent        Comments: independent prior to first CVA last week. Was keeping her grandkids. Was  ambulatory when she went home last week and had use of RUE        OT Problem List: Decreased strength;Decreased range of motion;Impaired balance (sitting and/or standing);Decreased activity tolerance;Decreased coordination;Decreased cognition;Decreased safety awareness;Decreased knowledge of use of DME or AE;Decreased knowledge of precautions;Impaired UE functional use;Pain;Impaired vision/perception;Impaired sensation;Impaired tone      OT Treatment/Interventions: Self-care/ADL training;Therapeutic exercise;Energy conservation;DME and/or AE instruction;Therapeutic activities;Patient/family education;Neuromuscular education;Cognitive remediation/compensation;Visual/perceptual remediation/compensation;Balance training;Splinting    OT Goals(Current goals can be found in the care plan section) Acute Rehab OT Goals Patient Stated Goal: Get better OT Goal Formulation: With patient Time For Goal Achievement: 10/24/19 Potential to Achieve Goals: Good  OT Frequency: Min 3X/week   Barriers to D/C:            Co-evaluation PT/OT/SLP Co-Evaluation/Treatment: Yes Reason for Co-Treatment: For patient/therapist safety;Complexity of the patient's impairments (multi-system involvement);To address functional/ADL transfers PT goals addressed during session: Mobility/safety with mobility;Balance OT goals addressed during session: Strengthening/ROM;ADL's and self-care      AM-PAC OT "6 Clicks" Daily Activity     Outcome Measure Help from another person eating meals?: A Lot Help from another person taking care of personal grooming?: A Lot Help from another person toileting, which includes using toliet, bedpan, or urinal?: A Lot Help from another person bathing (including washing, rinsing, drying)?: A Lot Help from another person to put on and taking off regular upper body clothing?: A Lot Help from another person to put on and taking off regular lower body clothing?: A Lot 6 Click Score: 12   End  of Session Equipment Utilized During Treatment: Gait belt Nurse Communication: Mobility status  Activity Tolerance: Patient  tolerated treatment well Patient left: in chair;with call bell/phone within reach;with family/visitor present  OT Visit Diagnosis: Hemiplegia and hemiparesis;Other abnormalities of gait and mobility (R26.89);Unsteadiness on feet (R26.81) Hemiplegia - Right/Left: Right Hemiplegia - dominant/non-dominant: Dominant Hemiplegia - caused by: Cerebral infarction                Time: 4720-7218 OT Time Calculation (min): 28 min Charges:  OT General Charges $OT Visit: 1 Visit OT Evaluation $OT Eval Moderate Complexity: 1 Mod  Marcy Siren, OT Acute Rehabilitation Services Pager [phone] Office [phone]   Orlando Penner 10/10/2019, 4:24 PM

## 2019-10-10 NOTE — Progress Notes (Signed)
Rehab Admissions Coordinator Note:  Patient was screened by Clois Dupes for appropriateness for an Inpatient Acute Rehab Consult per therapy recs.  Last admission, patient and family did not want CIR admit.   At this time, we are recommending Inpatient Rehab consult if patient would like to be considered for admit. Please advise.  Clois Dupes RN MSN 10/10/2019, 2:19 PM  I can be reached at [phone].

## 2019-10-10 NOTE — Progress Notes (Signed)
STROKE TEAM PROGRESS NOTE   INTERVAL HISTORY Husband and daughter at bedside.  Patient readmitted for worsening right-sided weakness.  CT head and neck showed reocclusion of inferior M2 status post stent.  P2Y12 = 302 indicating Plavix did not work.  Currently on Brilinta.  Patient still has right arm flaccid, PT/OT recommend CIR.  Vitals:   10/09/19 2340 10/10/19 0447 10/10/19 0809 10/10/19 1206  BP: (!) 120/53 121/62 137/63 131/76  Pulse: (!) 58 71 70 65  Resp: 18 19  18   Temp: 98 F (36.7 C) 98 F (36.7 C) (!) 97.5 F (36.4 C) 98.1 F (36.7 C)  TempSrc: Oral Oral Oral Oral  SpO2: 96% 97% 97% 99%    CBC:  Recent Labs  Lab 10/09/19 0930 10/09/19 0930 10/09/19 0937 10/10/19 0341  WBC 10.4  --   --  8.9  NEUTROABS 7.2  --   --   --   HGB 11.1*   < > 10.5* 11.1*  HCT 34.5*   < > 31.0* 33.0*  MCV 94.8  --   --  92.4  PLT 362  --   --  400   < > = values in this interval not displayed.    Basic Metabolic Panel:  Recent Labs  Lab 10/09/19 0930 10/09/19 0930 10/09/19 0937 10/10/19 0341  NA 143   < > 142 142  K 4.1   < > 3.9 3.3*  CL 112*   < > 111 108  CO2 21*  --   --  21*  GLUCOSE 99   < > 94 80  BUN 13   < > 13 12  CREATININE 0.84   < > 0.80 0.84  CALCIUM 9.2  --   --  9.0   < > = values in this interval not displayed.   IMAGING past 48 hours CT Code Stroke CTA Head W/WO contrast  Result Date: 10/09/2019 CLINICAL DATA:  Stroke symptoms. EXAM: CT ANGIOGRAPHY HEAD AND NECK CT PERFUSION BRAIN TECHNIQUE: Multidetector CT imaging of the head and neck was performed using the standard protocol during bolus administration of intravenous contrast. Multiplanar CT image reconstructions and MIPs were obtained to evaluate the vascular anatomy. Carotid stenosis measurements (when applicable) are obtained utilizing NASCET criteria, using the distal internal carotid diameter as the denominator. Multiphase CT imaging of the brain was performed following IV bolus contrast injection.  Subsequent parametric perfusion maps were calculated using RAPID software. CONTRAST:  48mL OMNIPAQUE IOHEXOL 350 MG/ML SOLN; 35mL OMNIPAQUE IOHEXOL 350 MG/ML SOLN COMPARISON:  CTA from 5 days ago FINDINGS: CTA NECK FINDINGS Aortic arch: Atherosclerotic plaque.  Three vessel branching. Right carotid system: Calcified plaque at the ICA bulb without flow limiting stenosis or ulceration. Left carotid system: Mild calcified plaque at the bulb without flow limiting stenosis or ulceration. Vertebral arteries: No proximal subclavian stenosis. There is calcified plaque at both vertebral origins with chronic occlusion on the left. Robust flow in the dominant right vertebral artery. There is reconstitution of the left vertebral artery at the V3 segment. Skeleton: Negative Other neck: Negative Upper chest: Centrilobular emphysema. Trace pleural fluid on the right. Review of the MIP images confirms the above findings CTA HEAD FINDINGS Anterior circulation: Y stent in the left MCA. The upper limb showed in stent thrombosis on prior with poor subsequent the flow, unchanged. The lower limb is newly affected with the same appearance and new poor downstream flow. No contralateral embolus is seen. Posterior circulation: Strong dominance of the right vertebral artery.  The vertebrobasilar arteries are smooth and widely patent. No branch occlusion or aneurysm Venous sinuses: Patent Anatomic variants: Negative Review of the MIP images confirms the above findings Case discussed with Dr. Amada Jupiter while in progress. CT Brain Perfusion Findings: CBF (<30%) Volume: 61mL Perfusion (Tmax>6.0s) volume: 87mL IMPRESSION: 1. Recent Y stenting of the left M1 and proximal M2 vessels. There is new in stent narrowing/thrombosis of the lower limb with downstream underfilling. In stent thrombosis of the upper limb persists. 2. Underestimated core infarct with pseudonormalization at the level of the subacute left basal ganglia infarcts and the interval  left temporal lobe infarct. There is 20 cc of ischemic range perfusion in the left temporal lobe and deep white matter which is more extensive than the noncontrast CT abnormality, probable true penumbra. 3. No new site of embolic disease. 4. 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 Electronically Signed   By: Marnee Spring M.D.   On: 10/09/2019 10:20   CT Code Stroke CTA Neck W/WO contrast  Result Date: 10/09/2019 CLINICAL DATA:  Stroke symptoms. EXAM: CT ANGIOGRAPHY HEAD AND NECK CT PERFUSION BRAIN TECHNIQUE: Multidetector CT imaging of the head and neck was performed using the standard protocol during bolus administration of intravenous contrast. Multiplanar CT image reconstructions and MIPs were obtained to evaluate the vascular anatomy. Carotid stenosis measurements (when applicable) are obtained utilizing NASCET criteria, using the distal internal carotid diameter as the denominator. Multiphase CT imaging of the brain was performed following IV bolus contrast injection. Subsequent parametric perfusion maps were calculated using RAPID software. CONTRAST:  58mL OMNIPAQUE IOHEXOL 350 MG/ML SOLN; 77mL OMNIPAQUE IOHEXOL 350 MG/ML SOLN COMPARISON:  CTA from 5 days ago FINDINGS: CTA NECK FINDINGS Aortic arch: Atherosclerotic plaque.  Three vessel branching. Right carotid system: Calcified plaque at the ICA bulb without flow limiting stenosis or ulceration. Left carotid system: Mild calcified plaque at the bulb without flow limiting stenosis or ulceration. Vertebral arteries: No proximal subclavian stenosis. There is calcified plaque at both vertebral origins with chronic occlusion on the left. Robust flow in the dominant right vertebral artery. There is reconstitution of the left vertebral artery at the V3 segment. Skeleton: Negative Other neck: Negative Upper chest: Centrilobular emphysema. Trace pleural fluid on the right. Review of the MIP images confirms the  above findings CTA HEAD FINDINGS Anterior circulation: Y stent in the left MCA. The upper limb showed in stent thrombosis on prior with poor subsequent the flow, unchanged. The lower limb is newly affected with the same appearance and new poor downstream flow. No contralateral embolus is seen. Posterior circulation: Strong dominance of the right vertebral artery. The vertebrobasilar arteries are smooth and widely patent. No branch occlusion or aneurysm Venous sinuses: Patent Anatomic variants: Negative Review of the MIP images confirms the above findings Case discussed with Dr. Amada Jupiter while in progress. CT Brain Perfusion Findings: CBF (<30%) Volume: 39mL Perfusion (Tmax>6.0s) volume: 31mL IMPRESSION: 1. Recent Y stenting of the left M1 and proximal M2 vessels. There is new in stent narrowing/thrombosis of the lower limb with downstream underfilling. In stent thrombosis of the upper limb persists. 2. Underestimated core infarct with pseudonormalization at the level of the subacute left basal ganglia infarcts and the interval left temporal lobe infarct. There is 20 cc of ischemic range perfusion in the left temporal lobe and deep white matter which is more extensive than the noncontrast CT abnormality, probable true penumbra. 3. No new site of embolic disease. 4. Chronic occlusion of the  non dominant left vertebral artery with distal reconstitution. Electronically Signed   By: Marnee SpringJonathon  Watts M.D.   On: 10/09/2019 10:20   MR BRAIN WO CONTRAST  Result Date: 10/09/2019 CLINICAL DATA:  Stroke. EXAM: MRI HEAD WITHOUT CONTRAST TECHNIQUE: Multiplanar, multiecho pulse sequences of the brain and surrounding structures were obtained without intravenous contrast. COMPARISON:  MRI 10/04/2019 FINDINGS: Brain: New area of acute infarct in the left superior temporal lobe. No change in acute infarct in the left lenticular nuclei and in the left frontal white matter. Small areas of acute infarct in the left insula unchanged.  Ventricle size normal. Mild chronic microvascular ischemic change in the white matter. No acute hemorrhage or mass. Vascular: Left MCA stent noted. Increased signal on FLAIR and left MCA branches may reflect slow flow or occlusion. CTA today demonstrates left MCA thrombus with decreased flow Normal arterial flow void right vertebral artery as well as in the vertebrobasilar arteries. Skull and upper cervical spine: No focal skeletal lesion. Sinuses/Orbits: Mucosal edema paranasal sinuses.  Negative orbit Other: None IMPRESSION: Acute infarct left MCA territory. New area of acute infarct left superior temporal lobe compared with the prior MRI 10/03/2019. Abnormal signal in the left MCA branches compatible with slow flow or occlusion. See CTA result from earlier today. No acute intracranial hemorrhage. Electronically Signed   By: Marlan Palauharles  Clark M.D.   On: 10/09/2019 15:48   CT CEREBRAL PERFUSION W CONTRAST  Result Date: 10/09/2019 CLINICAL DATA:  Stroke symptoms. EXAM: CT ANGIOGRAPHY HEAD AND NECK CT PERFUSION BRAIN TECHNIQUE: Multidetector CT imaging of the head and neck was performed using the standard protocol during bolus administration of intravenous contrast. Multiplanar CT image reconstructions and MIPs were obtained to evaluate the vascular anatomy. Carotid stenosis measurements (when applicable) are obtained utilizing NASCET criteria, using the distal internal carotid diameter as the denominator. Multiphase CT imaging of the brain was performed following IV bolus contrast injection. Subsequent parametric perfusion maps were calculated using RAPID software. CONTRAST:  75mL OMNIPAQUE IOHEXOL 350 MG/ML SOLN; 40mL OMNIPAQUE IOHEXOL 350 MG/ML SOLN COMPARISON:  CTA from 5 days ago FINDINGS: CTA NECK FINDINGS Aortic arch: Atherosclerotic plaque.  Three vessel branching. Right carotid system: Calcified plaque at the ICA bulb without flow limiting stenosis or ulceration. Left carotid system: Mild calcified plaque  at the bulb without flow limiting stenosis or ulceration. Vertebral arteries: No proximal subclavian stenosis. There is calcified plaque at both vertebral origins with chronic occlusion on the left. Robust flow in the dominant right vertebral artery. There is reconstitution of the left vertebral artery at the V3 segment. Skeleton: Negative Other neck: Negative Upper chest: Centrilobular emphysema. Trace pleural fluid on the right. Review of the MIP images confirms the above findings CTA HEAD FINDINGS Anterior circulation: Y stent in the left MCA. The upper limb showed in stent thrombosis on prior with poor subsequent the flow, unchanged. The lower limb is newly affected with the same appearance and new poor downstream flow. No contralateral embolus is seen. Posterior circulation: Strong dominance of the right vertebral artery. The vertebrobasilar arteries are smooth and widely patent. No branch occlusion or aneurysm Venous sinuses: Patent Anatomic variants: Negative Review of the MIP images confirms the above findings Case discussed with Dr. Amada JupiterKirkpatrick while in progress. CT Brain Perfusion Findings: CBF (<30%) Volume: 0mL Perfusion (Tmax>6.0s) volume: 20mL IMPRESSION: 1. Recent Y stenting of the left M1 and proximal M2 vessels. There is new in stent narrowing/thrombosis of the lower limb with downstream underfilling. In stent thrombosis of the  upper limb persists. 2. Underestimated core infarct with pseudonormalization at the level of the subacute left basal ganglia infarcts and the interval left temporal lobe infarct. There is 20 cc of ischemic range perfusion in the left temporal lobe and deep white matter which is more extensive than the noncontrast CT abnormality, probable true penumbra. 3. No new site of embolic disease. 4. 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 Electronically Signed   By: Monte Fantasia M.D.   On: 10/09/2019 10:20   CT HEAD CODE STROKE WO  CONTRAST  Result Date: 10/09/2019 CLINICAL DATA:  Code stroke. Right-sided weakness and slurred speech with right facial droop. EXAM: CT HEAD WITHOUT CONTRAST TECHNIQUE: Contiguous axial images were obtained from the base of the skull through the vertex without intravenous contrast. COMPARISON:  MRI 6 days ago FINDINGS: Brain: Subacute infarct in the left basal ganglia and frontal white matter, similar in extent to interval brain MRI. There is interval cortical infarct in the superficial and inferior left temporal lobe. No hemorrhagic conversion or midline shift. Vascular: Left MCA Y stent.  No hyperdense vessel seen elsewhere. Skull: Negative Sinuses/Orbits: Negative Other: A call has been placed to Dr. Leonel Ramsay. ASPECTS [facility] Stroke Program Early CT Score) Not scored in this setting IMPRESSION: 1. Interval infarction in the left temporal cortex. 2. 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 No hemorrhagic conversion. Electronically Signed   By: Monte Fantasia M.D.   On: 10/09/2019 09:42    PHYSICAL EXAM  Temp:  [97.5 F (36.4 C)-98.4 F (36.9 C)] 98.4 F (36.9 C) (03/16 2016) Pulse Rate:  [58-80] 73 (03/16 2016) Resp:  [14-19] 17 (03/16 2016) BP: (119-137)/(52-76) 133/52 (03/16 2016) SpO2:  [95 %-99 %] 95 % (03/16 2016)  General - Well nourished, well developed, in no apparent distress.  Ophthalmologic - fundi not visualized due to noncooperation.  Cardiovascular - Regular rhythm and rate.  Mental Status -  Level of arousal and orientation to time, place, and person were intact. Language including expression, naming, repetition, comprehension was assessed and found intact.  However, paucity of speech  Cranial Nerves II - XII - II - Visual field intact OU. III, IV, VI - Extraocular movements intact. V - Facial sensation intact bilaterally. VII - Right facial droop. VIII - Hearing & vestibular intact bilaterally. X - Palate elevates  symmetrically. XI - Chin turning & shoulder shrug intact bilaterally. XII - Tongue protrusion intact.  Motor Strength - The patient's strength was normal in left upper and lower extremities, however, right upper extremity 0/5 with increased muscle tone, lower extremity 3+/5 proximal and distal.  Bulk was normal and fasciculations were absent.    Reflexes - The patient's reflexes were symmetrical in all extremities and Erin Peck had no pathological reflexes.  Sensory - Light touch, temperature/pinprick were assessed and were symmetrical.    Coordination - The patient had normal movements in the left hand with no ataxia or dysmetria.  Tremor was absent.  Gait and Station - deferred.   ASSESSMENT/PLAN [name] is a 54 y.o. female with history of recent cryptogenic stroke d/t mechanical thrombectomy w/ intracranial stenting following reocclusion d/c home 2 days previously after refusing CIR.  Pt reports worsening R hemiparesis and expressive aphasia. CTA showed in-stent thrombus and new infarct on CT felt to be d/t plavix failure (pt compliant).  Stroke:  Acute L temporal lobe infarct d/t new in-stent thrombosis d/t plavix failure in pt  with recent L MCA stent   Code Stroke CT head interval L temporal cortex infarct. Pre-existing subcortical infarct L MCA territory.  CTA head & neck recnet Y stenting L M1 and proximal M2. New in stent narrowing of inferior division MCA w/ downstream underfilling. In stent thrombus upper limb persists. No new embolic dz. Chronic occlusion non dominant L VA  CT perfusion underestimated core infarct w/ pseudonormalization at level of subacute L basal ganglia infarct and L temporal lobe infarct. 20cc perfusion L temp and deep white matter more extensive thatn CT, probably true penumbra.   MRI  Acute L MCA territory infarct. New L superior temporal lobe infarct. Slow flow in L MCA branches c/w occlusion. No ICH.   Follow-up EP cardiology NP Gypsy Balsam,  11/09/2019 at 12pm to discuss loop recorder placement  Lovenox 40 mg sq daily for VTE prophylaxis  aspirin 325 mg daily and clopidogrel 75 mg daily prior to admission (complaint), now on aspirin 325 mg daily and Brilinta (ticagrelor) 90 mg bid.  Continue on discharge  Therapy recommendations:  CIR  Disposition:  pending   Hx stroke/TIA  09/2019 - Patchy L MCA infarcts due to left M1 occlusion s/p tPA and IR w/ TICI2C revascularization with L MCA stent, infarct embolic secondary to unknown source d/c home on aspirin and plavix as unable to afford Brilinta w/ aspirin given no insurance. lipitor 80. amandatine for aphasia recovery. Refused CIR.   Hypotension . Permissive hypertension (OK if < 220/120) but gradually normalize in 5-7 days . Long-term BP goal 120-150 given stent occlusion . BP soft . Put back on midodrine 5 mg 3 times daily  Hyperlipidemia  Home meds:  lipitor 80, resumed in hospital  LDL 152, goal < 70  Continue statin at discharge  Other Stroke Risk Factors  Family hx stroke (mother and father, both in their 78s)  Hx complex Migraines  Aortic atherosclerosis  Other Active Problems  Hypokalemia 3.3 - supplemented   Aphasia recovery- on amantadine 100 daily as recommended by [facility]  Hospital day # 1  Neurology will sign off. Please call with questions. Pt will follow up with stroke clinic NP at [facility] in about 4 weeks as scheduled during last admission. Thanks for the consult.  Marvel Plan, MD PhD Stroke Neurology 10/10/2019 8:44 PM   To contact Stroke Continuity provider, please refer to [email or web address]. After hours, contact General Neurology

## 2019-10-10 NOTE — Progress Notes (Signed)
   Subjective:   Feeling ok this morning, no overnight concerns, wondering when she can go home. She still is unable to lift her right hand. Feels that her speaking has slightly improved.   Objective:  Vital signs in last 24 hours: Vitals:   10/09/19 2000 10/09/19 2215 10/09/19 2340 10/10/19 0447  BP: 132/78 134/63 (!) 120/53 121/62  Pulse: 64 69 (!) 58 71  Resp: 19 14 18 19   Temp: 98.4 F (36.9 C) 98.2 F (36.8 C) 98 F (36.7 C) 98 F (36.7 C)  TempSrc: Oral Oral Oral Oral  SpO2: 98% 97% 96% 97%   Constitution: NAD, sitting up in chair  HEENT: eom intact, [place]/AT Cardio: RRR, no m/r/g  Respiratory: CTA, no w/r/r  MSK: RUE 0/5, RLE 4/5; LUE & LLE strength 5/5; sensation intact  Neuro: CN II-XII grossly intact except with improvement in shoulder shrug and slight improvement in dysarthria Skin: c/d/i    Assessment/Plan:  Active Problems:   CVA (cerebral vascular accident) [facility])   54yo female with recent left MCA stroke one week ago treated with tPA and stenting. Developed in-hospital stenosis of superior stent, was revascularized and treated with integrelin & aggranox & loading of brillinta, discharged on plavix and aspirin. She returns with recurrence of stroke with thrombosis of the lower limb of the stent.   Recurrence of Left MCA Stroke s/p stent 2/2 in stent thrombosis  Unclear etiology with previous stroke. Recurrence leading to this admission is thought to be secondary to failure of plavix. She has been switched back to brilinta and aspirin.   - will need loop recorder, will arrange with cards  - cont. PT/OT - cleared for regular diet  - cont. Brilinta/asa 325 mg qd - cont. Atorvastatin 80  Prior to Admission Living Arrangement: home Anticipated Discharge Location: TBD Barriers to Discharge: PT/OT Dispo: Anticipated discharge pending clinical imrovement.   54yo, MD 10/10/2019, 7:27 AM Pager: [phone]

## 2019-10-11 ENCOUNTER — Encounter (HOSPITAL_COMMUNITY): Payer: Self-pay | Admitting: Physical Medicine & Rehabilitation

## 2019-10-11 ENCOUNTER — Inpatient Hospital Stay (HOSPITAL_COMMUNITY)
Admission: RE | Admit: 2019-10-11 | Discharge: 2019-10-17 | DRG: 057 | Disposition: A | Discharge status: Home Health | Payer: Medicaid Other | Source: Intra-hospital | Attending: Physical Medicine & Rehabilitation | Admitting: Physical Medicine & Rehabilitation

## 2019-10-11 DIAGNOSIS — Z7982 Long term (current) use of aspirin: Secondary | ICD-10-CM | POA: Diagnosis not present

## 2019-10-11 DIAGNOSIS — I10 Essential (primary) hypertension: Secondary | ICD-10-CM | POA: Diagnosis present

## 2019-10-11 DIAGNOSIS — E785 Hyperlipidemia, unspecified: Secondary | ICD-10-CM | POA: Diagnosis present

## 2019-10-11 DIAGNOSIS — Z79899 Other long term (current) drug therapy: Secondary | ICD-10-CM

## 2019-10-11 DIAGNOSIS — K5900 Constipation, unspecified: Secondary | ICD-10-CM | POA: Diagnosis present

## 2019-10-11 DIAGNOSIS — I69351 Hemiplegia and hemiparesis following cerebral infarction affecting right dominant side: Secondary | ICD-10-CM | POA: Diagnosis present

## 2019-10-11 DIAGNOSIS — I63512 Cerebral infarction due to unspecified occlusion or stenosis of left middle cerebral artery: Secondary | ICD-10-CM

## 2019-10-11 DIAGNOSIS — I69320 Aphasia following cerebral infarction: Secondary | ICD-10-CM | POA: Diagnosis not present

## 2019-10-11 LAB — COMPREHENSIVE METABOLIC PANEL
ALT: 53 U/L — ABNORMAL HIGH (ref 0–44)
AST: 30 U/L (ref 15–41)
Albumin: 3.5 g/dL (ref 3.5–5.0)
Alkaline Phosphatase: 68 U/L (ref 38–126)
Anion gap: 11 (ref 5–15)
BUN: 20 mg/dL (ref 6–20)
CO2: 21 mmol/L — ABNORMAL LOW (ref 22–32)
Calcium: 9.2 mg/dL (ref 8.9–10.3)
Chloride: 111 mmol/L (ref 98–111)
Creatinine, Ser: 0.73 mg/dL (ref 0.44–1.00)
GFR calc Af Amer: >60 mL/min (ref >60)
GFR calc non Af Amer: >60 mL/min (ref >60)
Glucose, Bld: 103 mg/dL — ABNORMAL HIGH (ref 70–99)
Potassium: 3.9 mmol/L (ref 3.5–5.1)
Sodium: 143 mmol/L (ref 135–145)
Total Bilirubin: 0.7 mg/dL (ref 0.3–1.2)
Total Protein: 6.2 g/dL — ABNORMAL LOW (ref 6.5–8.1)

## 2019-10-11 LAB — CBC WITH DIFFERENTIAL/PLATELET
Abs Immature Granulocytes: 0.1 10*3/uL — ABNORMAL HIGH (ref 0.00–0.07)
Basophils Absolute: 0.1 10*3/uL (ref 0.0–0.1)
Basophils Relative: 1 %
Eosinophils Absolute: 0.3 10*3/uL (ref 0.0–0.5)
Eosinophils Relative: 3 %
HCT: 32.9 % — ABNORMAL LOW (ref 36.0–46.0)
Hemoglobin: 10.9 g/dL — ABNORMAL LOW (ref 12.0–15.0)
Immature Granulocytes: 1 %
Lymphocytes Relative: 25 %
Lymphs Abs: 2.5 10*3/uL (ref 0.7–4.0)
MCH: 30.6 pg (ref 26.0–34.0)
MCHC: 33.1 g/dL (ref 30.0–36.0)
MCV: 92.4 fL (ref 80.0–100.0)
Monocytes Absolute: 0.8 10*3/uL (ref 0.1–1.0)
Monocytes Relative: 8 %
Neutro Abs: 6.6 10*3/uL (ref 1.7–7.7)
Neutrophils Relative %: 62 %
Platelets: 444 10*3/uL — ABNORMAL HIGH (ref 150–400)
RBC: 3.56 MIL/uL — ABNORMAL LOW (ref 3.87–5.11)
RDW: 12.6 % (ref 11.5–15.5)
WBC: 10.3 10*3/uL (ref 4.0–10.5)
nRBC: 0 % (ref 0.0–0.2)

## 2019-10-11 MED ORDER — TICAGRELOR 90 MG PO TABS: 90.0000 mg | ORAL_TABLET | Freq: Two times a day (BID) | ORAL | 0 refills | Status: DC

## 2019-10-11 MED ORDER — ENOXAPARIN SODIUM 40 MG/0.4ML ~~LOC~~ SOLN
40.0000 mg | SUBCUTANEOUS | Status: DC
  Administered 2019-10-12 – 2019-10-16 (×5): 40 mg via SUBCUTANEOUS
  Filled 2019-10-11 (×5): qty 0.4

## 2019-10-11 MED ORDER — ENOXAPARIN SODIUM 40 MG/0.4ML ~~LOC~~ SOLN: 40.0000 mg | SUBCUTANEOUS | Status: DC

## 2019-10-11 MED ORDER — ACETAMINOPHEN 650 MG RE SUPP: 650.0000 mg | RECTAL | Status: DC | PRN

## 2019-10-11 MED ORDER — SENNOSIDES-DOCUSATE SODIUM 8.6-50 MG PO TABS: 1.0000 | ORAL_TABLET | Freq: Every evening | ORAL | Status: DC | PRN

## 2019-10-11 MED ORDER — MIDODRINE HCL 5 MG PO TABS
10.0000 mg | ORAL_TABLET | Freq: Three times a day (TID) | ORAL | Status: DC
  Administered 2019-10-11 (×2): 10 mg via ORAL
  Filled 2019-10-11 (×2): qty 2

## 2019-10-11 MED ORDER — MIDODRINE HCL 5 MG PO TABS: 5.0000 mg | ORAL_TABLET | Freq: Three times a day (TID) | ORAL | Status: DC

## 2019-10-11 MED ORDER — SORBITOL 70 % SOLN: 30.0000 mL | Freq: Every day | Status: DC | PRN

## 2019-10-11 MED ORDER — SODIUM CHLORIDE 0.9 % IV BOLUS: 500.0000 mL | Freq: Once | INTRAVENOUS | Status: DC

## 2019-10-11 MED ORDER — ATORVASTATIN CALCIUM 80 MG PO TABS: 80.0000 mg | ORAL_TABLET | Freq: Every day | ORAL | Status: DC

## 2019-10-11 MED ORDER — TICAGRELOR 90 MG PO TABS
90.0000 mg | ORAL_TABLET | Freq: Two times a day (BID) | ORAL | Status: DC
  Administered 2019-10-11 – 2019-10-17 (×12): 90 mg via ORAL
  Filled 2019-10-11 (×12): qty 1

## 2019-10-11 MED ORDER — MIDODRINE HCL 5 MG PO TABS: 10.0000 mg | ORAL_TABLET | Freq: Three times a day (TID) | ORAL | 1 refills | Status: DC

## 2019-10-11 MED ORDER — IPRATROPIUM-ALBUTEROL 0.5-2.5 (3) MG/3ML IN SOLN: 3.0000 mL | RESPIRATORY_TRACT | Status: DC | PRN

## 2019-10-11 MED ORDER — ATORVASTATIN CALCIUM 80 MG PO TABS
80.0000 mg | ORAL_TABLET | Freq: Every day | ORAL | Status: DC
  Administered 2019-10-12 – 2019-10-16 (×5): 80 mg via ORAL
  Filled 2019-10-11 (×5): qty 1

## 2019-10-11 MED ORDER — ACETAMINOPHEN 325 MG PO TABS
650.0000 mg | ORAL_TABLET | ORAL | Status: DC | PRN
  Filled 2019-10-11: qty 2

## 2019-10-11 MED ORDER — MIDODRINE HCL 5 MG PO TABS
10.0000 mg | ORAL_TABLET | Freq: Three times a day (TID) | ORAL | Status: DC
  Administered 2019-10-12 (×3): 10 mg via ORAL
  Filled 2019-10-11 (×3): qty 2

## 2019-10-11 MED ORDER — AMANTADINE HCL 100 MG PO CAPS
100.0000 mg | ORAL_CAPSULE | Freq: Every day | ORAL | Status: DC
  Administered 2019-10-12 – 2019-10-17 (×6): 100 mg via ORAL
  Filled 2019-10-11 (×6): qty 1

## 2019-10-11 MED ORDER — ASPIRIN 325 MG PO TABS
325.0000 mg | ORAL_TABLET | Freq: Every day | ORAL | Status: DC
  Administered 2019-10-12 – 2019-10-17 (×6): 325 mg via ORAL
  Filled 2019-10-11 (×6): qty 1

## 2019-10-11 MED ORDER — SODIUM CHLORIDE 0.9 % IV BOLUS
1000.0000 mL | Freq: Once | INTRAVENOUS | Status: AC
  Administered 2019-10-11: 1000 mL via INTRAVENOUS

## 2019-10-11 MED ORDER — PANTOPRAZOLE SODIUM 40 MG PO PACK
40.0000 mg | PACK | ORAL | Status: DC
  Administered 2019-10-11 – 2019-10-12 (×2): 40 mg via ORAL
  Filled 2019-10-11 (×2): qty 20

## 2019-10-11 MED ORDER — ACETAMINOPHEN 160 MG/5ML PO SOLN: 650.0000 mg | ORAL | Status: DC | PRN

## 2019-10-11 NOTE — Progress Notes (Signed)
Inpatient Rehab Admissions:  Inpatient Rehab Consult received.  I met with patient, her daughter, and her husband at the bedside for rehabilitation assessment and to discuss goals and expectations of an inpatient rehab admission.  They are open to CIR, and per MD, ready to admit today.  Will let RN and CSW/CM know.    Signed: Shann Medal, PT, DPT Admissions Coordinator [phone] 10/11/19  1:35 PM

## 2019-10-11 NOTE — Discharge Summary (Signed)
Name: Erin Peck MRN: [id number] DOB: 09-25-1965 54 y.o. 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   aspirin 325 MG EC tablet Take 1 tablet (325 mg total) by mouth daily.   atorvastatin 80 MG tablet Commonly known as: LIPITOR Take 1 tablet (80 mg total) by mouth daily at 6 PM.   ibuprofen 200 MG tablet Commonly known as: ADVIL Take 200 mg by mouth every 6 (six) hours as needed for mild pain or moderate pain.   midodrine 5 MG tablet Commonly known as: PROAMATINE Take 2 tablets (10 mg total) by mouth 3 (three) times daily with meals. What changed: how much to take   ticagrelor 90 MG Tabs tablet Commonly known as: BRILINTA Take 1 tablet (90 mg total) by mouth 2 (two) times daily.       Disposition and follow-up:   ErinErin Peck was discharged from [facility] in Stable condition.  At the hospital follow up visit please address:  1.  Stroke Acute L temporal lobe infarct after new in-stent thrombosis due to plavix failure in pt with recent L MCA stent - pt with interval infarct after Plavix failure - exam on discharge -> right upper extremity 0/5 and lower extremity 3+/5, and normal in left upper and lower extremities - discharged on 90mg  BID Brilinta and 325mg  ASA - follow-up with stroke team outpatient in 4 weeks - outpatient cardiology for  potential loop recorder placement on 4/15 - continue atorvastatin 80mg  daily - amantadine 100mg  daily for aphasia recovery  Hypotension - discharged on midodrine 10mg  TID - systolic BP goal given stent occlusion  2.  Labs / imaging needed at time of follow-up: NONE  3.  Pending labs/ test needing follow-up: NONE  Follow-up Appointments: Follow-up Information    [facility]. Call.   Why: Call to ensure that a follow-up appointment with the stroke clinic has been scheduled 4 weeks from now. Contact information: [address]     [address] [place]  [phone]          Hospital Course by problem list: 1. Erin Peck is a 54 year old female with a recent left MCA stroke who was on the neurology service after TPA and IR thrombectomy with a left MCA stent placement. She was discharged on 3/13 with residual deficits of LUE 4/5, RUE 3/5, LLE 3/5, and RLE 3/5. She returned with worsening right upper extremity weakness to 0/5, slurred speech, and headache. Neurology was consulted on presentation to the ED, and CT imaging revealed a new interval temporal infarct. Of note, after her stent placement during the prior admission, she was on aspirin and Brilinta. Then, there was concern about Brilinta, so it was changed to Plavix. Prior to discharge she did receive a Plavix load and reports adherence to aspirin and Plavix at home for the previous  two days. Her interval infarct and in-stent thrombosis were attributed to pt being a Plavix non-responder. Her platelet inhibition P2Y12 level was 302, indicating the Plavix did not work. was She was loaded with Brilinta 180mg , and then continued with 90mg  twice daily. Pt remained on ASA 325mg  during this time. Pt had persistent soft blood pressures/hypotension during her admission, so was started on midodrine 10mg  TID to keep long term BP between 120-150. She was re-evaluated by PT/OT/SLP who recommended  CIR for pt's residual deficits. Pt was discharged to CIR on hospital day 2.   Discharge Vitals:   BP (!) 141/72 (BP Location: Left Arm)   Pulse (!) 51   Temp (!) 97 F (36.1 C) (Oral)   Resp 16   SpO2 98%   Pertinent Labs, Studies, and Procedures:   CBC Latest Ref Rng & Units 10/12/2019 10/11/2019 10/10/2019  WBC 4.0 - 10.5 K/uL 8.6 10.3 8.9  Hemoglobin 12.0 - 15.0 g/dL 11.0(L) 10.9(L) 11.1(L)  Hematocrit 36.0 - 46.0 % 33.8(L) 32.9(L) 33.0(L)  Platelets 150 - 400 K/uL 474(H) 444(H) 400   CMP Latest Ref Rng & Units 10/12/2019 10/11/2019 10/10/2019  Glucose 70 - 99 mg/dL 87 10/12/2019) 80  BUN 6 - 20 mg/dL 19 20 12   Creatinine 0.44 - 1.00 mg/dL 10/14/2019 10/13/2019 10/12/2019  Sodium 135 - 145 mmol/L 141 143 142  Potassium 3.5 - 5.1 mmol/L 3.7 3.9 3.3(L)  Chloride 98 - 111 mmol/L 109 111 108  CO2 22 - 32 mmol/L 22 21(L) 21(L)  Calcium 8.9 - 10.3 mg/dL 9.0 9.2 9.0  Total Protein 6.5 - 8.1 g/dL 6.3(L) 6.2(L) -  Total Bilirubin 0.3 - 1.2 mg/dL 0.7 0.7 -  Alkaline Phos 38 - 126 U/L 73 68 -  AST 15 - 41 U/L 29 30 -  ALT 0 - 44 U/L 55(H) 53(H) -   Platelet Function P2Y12 182 - 335 PRU 302   CT Code Stroke CTA Head W/WO contrast  Result Date: 10/09/2019 CLINICAL DATA:  Stroke symptoms. EXAM: CT ANGIOGRAPHY HEAD AND NECK CT PERFUSION BRAIN TECHNIQUE: Multidetector CT imaging of the head and neck was performed using the standard protocol during bolus administration of intravenous contrast. Multiplanar CT image reconstructions and MIPs were obtained to evaluate the vascular anatomy. Carotid stenosis measurements (when applicable) are obtained utilizing NASCET criteria, using the distal internal carotid diameter as the denominator. Multiphase CT imaging of the brain was performed following IV bolus contrast injection. Subsequent parametric perfusion maps were calculated using RAPID software. CONTRAST:  74mL OMNIPAQUE IOHEXOL 350 MG/ML SOLN; 66mL OMNIPAQUE IOHEXOL 350 MG/ML SOLN COMPARISON:  CTA from 5 days ago  FINDINGS: CTA NECK FINDINGS Aortic arch: Atherosclerotic plaque.  Three vessel branching. Right carotid system: Calcified plaque at the ICA bulb without flow limiting stenosis or ulceration. Left carotid system: Mild calcified plaque at the bulb without flow limiting stenosis or ulceration. Vertebral arteries: No proximal subclavian stenosis. There is calcified plaque at both vertebral origins with chronic occlusion on the left. Robust flow in the dominant right vertebral artery. There is reconstitution of the left vertebral artery at the V3 segment. Skeleton: Negative Other neck: Negative Upper chest: Centrilobular emphysema. Trace pleural fluid on the right. Review of the MIP images confirms the above findings CTA HEAD FINDINGS Anterior circulation: Y stent in the left MCA. The upper limb showed in stent thrombosis on prior with poor subsequent the flow, unchanged. The lower limb is newly affected with the same appearance and new poor downstream flow. No contralateral embolus  is seen. Posterior circulation: Strong dominance of the right vertebral artery. The vertebrobasilar arteries are smooth and widely patent. No branch occlusion or aneurysm Venous sinuses: Patent Anatomic variants: Negative Review of the MIP images confirms the above findings Case discussed with Dr. Amada JupiterKirkpatrick while in progress. CT Brain Perfusion Findings: CBF (<30%) Volume: 0mL Perfusion (Tmax>6.0s) volume: 20mL IMPRESSION: 1. Recent Y stenting of the left M1 and proximal M2 vessels. There is new in stent narrowing/thrombosis of the lower limb with downstream underfilling. In stent thrombosis of the upper limb persists. 2. Underestimated core infarct with pseudonormalization at the level of the subacute left basal ganglia infarcts and the interval left temporal lobe infarct. There is 20 cc of ischemic range perfusion in the left temporal lobe and deep white matter which is more extensive than the noncontrast CT abnormality, probable true  penumbra. 3. No new site of embolic disease. 4. 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 Electronically Signed   By: Marnee SpringJonathon  Watts M.D.   On: 10/09/2019 10:20   CT Code Stroke CTA Neck W/WO contrast  Result Date: 10/09/2019 CLINICAL DATA:  Stroke symptoms. EXAM: CT ANGIOGRAPHY HEAD AND NECK CT PERFUSION BRAIN TECHNIQUE: Multidetector CT imaging of the head and neck was performed using the standard protocol during bolus administration of intravenous contrast. Multiplanar CT image reconstructions and MIPs were obtained to evaluate the vascular anatomy. Carotid stenosis measurements (when applicable) are obtained utilizing NASCET criteria, using the distal internal carotid diameter as the denominator. Multiphase CT imaging of the brain was performed following IV bolus contrast injection. Subsequent parametric perfusion maps were calculated using RAPID software. CONTRAST:  75mL OMNIPAQUE IOHEXOL 350 MG/ML SOLN; 40mL OMNIPAQUE IOHEXOL 350 MG/ML SOLN COMPARISON:  CTA from 5 days ago FINDINGS: CTA NECK FINDINGS Aortic arch: Atherosclerotic plaque.  Three vessel branching. Right carotid system: Calcified plaque at the ICA bulb without flow limiting stenosis or ulceration. Left carotid system: Mild calcified plaque at the bulb without flow limiting stenosis or ulceration. Vertebral arteries: No proximal subclavian stenosis. There is calcified plaque at both vertebral origins with chronic occlusion on the left. Robust flow in the dominant right vertebral artery. There is reconstitution of the left vertebral artery at the V3 segment. Skeleton: Negative Other neck: Negative Upper chest: Centrilobular emphysema. Trace pleural fluid on the right. Review of the MIP images confirms the above findings CTA HEAD FINDINGS Anterior circulation: Y stent in the left MCA. The upper limb showed in stent thrombosis on prior with poor subsequent the flow, unchanged. The lower limb is newly  affected with the same appearance and new poor downstream flow. No contralateral embolus is seen. Posterior circulation: Strong dominance of the right vertebral artery. The vertebrobasilar arteries are smooth and widely patent. No branch occlusion or aneurysm Venous sinuses: Patent Anatomic variants: Negative Review of the MIP images confirms the above findings Case discussed with Dr. Amada JupiterKirkpatrick while in progress. CT Brain Perfusion Findings: CBF (<30%) Volume: 0mL Perfusion (Tmax>6.0s) volume: 20mL IMPRESSION: 1. Recent Y stenting of the left M1 and proximal M2 vessels. There is new in stent narrowing/thrombosis of the lower limb with downstream underfilling. In stent thrombosis of the upper limb persists. 2. Underestimated core infarct with pseudonormalization at the level of the subacute left basal ganglia infarcts and the interval left temporal lobe infarct. There is 20 cc of ischemic range perfusion in the left temporal lobe and deep white matter which is more extensive than the noncontrast CT abnormality, probable true penumbra. 3.  No new site of embolic disease. 4. 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 Electronically Signed   By: Marnee Spring M.D.   On: 10/09/2019 10:20   MR BRAIN WO CONTRAST  Result Date: 10/09/2019 CLINICAL DATA:  Stroke. EXAM: MRI HEAD WITHOUT CONTRAST TECHNIQUE: Multiplanar, multiecho pulse sequences of the brain and surrounding structures were obtained without intravenous contrast. COMPARISON:  MRI 10/04/2019 FINDINGS: Brain: New area of acute infarct in the left superior temporal lobe. No change in acute infarct in the left lenticular nuclei and in the left frontal white matter. Small areas of acute infarct in the left insula unchanged. Ventricle size normal. Mild chronic microvascular ischemic change in the white matter. No acute hemorrhage or mass. Vascular: Left MCA stent noted. Increased signal on FLAIR and left MCA branches  may reflect slow flow or occlusion. CTA today demonstrates left MCA thrombus with decreased flow Normal arterial flow void right vertebral artery as well as in the vertebrobasilar arteries. Skull and upper cervical spine: No focal skeletal lesion. Sinuses/Orbits: Mucosal edema paranasal sinuses.  Negative orbit Other: None IMPRESSION: Acute infarct left MCA territory. New area of acute infarct left superior temporal lobe compared with the prior MRI 10/03/2019. Abnormal signal in the left MCA branches compatible with slow flow or occlusion. See CTA result from earlier today. No acute intracranial hemorrhage. Electronically Signed   By: Marlan Palau M.D.   On: 10/09/2019 15:48   CT CEREBRAL PERFUSION W CONTRAST  Result Date: 10/09/2019 CLINICAL DATA:  Stroke symptoms. EXAM: CT ANGIOGRAPHY HEAD AND NECK CT PERFUSION BRAIN TECHNIQUE: Multidetector CT imaging of the head and neck was performed using the standard protocol during bolus administration of intravenous contrast. Multiplanar CT image reconstructions and MIPs were obtained to evaluate the vascular anatomy. Carotid stenosis measurements (when applicable) are obtained utilizing NASCET criteria, using the distal internal carotid diameter as the denominator. Multiphase CT imaging of the brain was performed following IV bolus contrast injection. Subsequent parametric perfusion maps were calculated using RAPID software. CONTRAST:  55mL OMNIPAQUE IOHEXOL 350 MG/ML SOLN; 30mL OMNIPAQUE IOHEXOL 350 MG/ML SOLN COMPARISON:  CTA from 5 days ago FINDINGS: CTA NECK FINDINGS Aortic arch: Atherosclerotic plaque.  Three vessel branching. Right carotid system: Calcified plaque at the ICA bulb without flow limiting stenosis or ulceration. Left carotid system: Mild calcified plaque at the bulb without flow limiting stenosis or ulceration. Vertebral arteries: No proximal subclavian stenosis. There is calcified plaque at both vertebral origins with chronic occlusion on the  left. Robust flow in the dominant right vertebral artery. There is reconstitution of the left vertebral artery at the V3 segment. Skeleton: Negative Other neck: Negative Upper chest: Centrilobular emphysema. Trace pleural fluid on the right. Review of the MIP images confirms the above findings CTA HEAD FINDINGS Anterior circulation: Y stent in the left MCA. The upper limb showed in stent thrombosis on prior with poor subsequent the flow, unchanged. The lower limb is newly affected with the same appearance and new poor downstream flow. No contralateral embolus is seen. Posterior circulation: Strong dominance of the right vertebral artery. The vertebrobasilar arteries are smooth and widely patent. No branch occlusion or aneurysm Venous sinuses: Patent Anatomic variants: Negative Review of the MIP images confirms the above findings Case discussed with Dr. Amada Jupiter while in progress. CT Brain Perfusion Findings: CBF (<30%) Volume: 83mL Perfusion (Tmax>6.0s) volume: 61mL IMPRESSION: 1. Recent Y stenting of the left M1 and proximal M2 vessels. There is new in stent narrowing/thrombosis of  the lower limb with downstream underfilling. In stent thrombosis of the upper limb persists. 2. Underestimated core infarct with pseudonormalization at the level of the subacute left basal ganglia infarcts and the interval left temporal lobe infarct. There is 20 cc of ischemic range perfusion in the left temporal lobe and deep white matter which is more extensive than the noncontrast CT abnormality, probable true penumbra. 3. No new site of embolic disease. 4. 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 Electronically Signed   By: Monte Fantasia M.D.   On: 10/09/2019 10:20   CT HEAD CODE STROKE WO CONTRAST  Result Date: 10/09/2019 CLINICAL DATA:  Code stroke. Right-sided weakness and slurred speech with right facial droop. EXAM: CT HEAD WITHOUT CONTRAST TECHNIQUE: Contiguous axial images  were obtained from the base of the skull through the vertex without intravenous contrast. COMPARISON:  MRI 6 days ago FINDINGS: Brain: Subacute infarct in the left basal ganglia and frontal white matter, similar in extent to interval brain MRI. There is interval cortical infarct in the superficial and inferior left temporal lobe. No hemorrhagic conversion or midline shift. Vascular: Left MCA Y stent.  No hyperdense vessel seen elsewhere. Skull: Negative Sinuses/Orbits: Negative Other: A call has been placed to Dr. Leonel Ramsay. ASPECTS [facility] Stroke Program Early CT Score) Not scored in this setting IMPRESSION: 1. Interval infarction in the left temporal cortex. 2. 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 No hemorrhagic conversion. Electronically Signed   By: Monte Fantasia M.D.   On: 10/09/2019 09:42     Discharge Instructions: Discharge Instructions    Call MD for:   Complete by: As directed    New weakness, sensation changes, or other deficits. Chest pain or trouble breathing. Vision changes or slurred speech.   Call MD for:  difficulty breathing, headache or visual disturbances   Complete by: As directed    Call MD for:  persistant dizziness or light-headedness   Complete by: As directed    Call MD for:  persistant nausea and vomiting   Complete by: As directed    Call MD for:  temperature >100.4   Complete by: As directed    Diet - low sodium heart healthy   Complete by: As directed    Discharge instructions   Complete by: As directed    Erin Peck,  You were admitted to the hospital after worsening right sided weakness. This most likely occurred after a failure of the Plavix medication to properly thin your blood, which can happen and is hard to predict. Please start a new medication called Brilinta, taken twice a day, as your blood thinner in addition to aspirin. It will also be important to keep your systolic blood pressure between  120-150 for good blood flow through your stent. To help with this, the midodrine medication has been increased to 10mg  three time a day to support the blood pressure.  Please follow-up with a primary doctor, the stroke clinic, and a cardiologist for continued monitoring and management when you leave the hospital.  Thank you for letting us be a part of your care!   Increase activity slowly   Complete by: As directed       Signed: Ladona Horns, MD 10/11/2019, 2:22 PM   Pager: [phone]

## 2019-10-11 NOTE — H&P (Signed)
Physical Medicine and Rehabilitation Admission H&P    Chief Complaint  Patient presents with  . Code Stroke  : HPI: Erin Peck is a 54 year old right-handed female with history of migraine headaches.  She lives with spouse independent prior to admission.  1 level home one-step to entry.  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  She was discharged to home after initially declining inpatient rehab services.  Presented 10/09/2019 with increased right side weakness.  CT/MRI showed acute infarction left MCA territory.  New area of acute infarction left superior temporal lobe compared to prior MRI of 10/03/2019.  Abnormal signal in the left MCA branches compatible with slow flow or occlusion.  CT angiogram of head and neck recent Y stenting of left M1 and proximal M2 vessels.. No new site of embolic disease.  Most recent echocardiogram with ejection fraction of 65%.  Neurology consulted currently maintained on aspirin 325 mg daily as well as the addition of Brilinta.  Subcutaneous Lovenox for DVT prophylaxis.  Close monitoring of blood pressure and maintain on ProAmatine.  Tolerating a regular diet.  Therapy evaluations completed and patient was admitted for a comprehensive rehab program  Review of Systems  Constitutional: Negative for chills and fever.  HENT: Negative for hearing loss.   Eyes: Negative for blurred vision and double vision.  Respiratory: Negative for cough and shortness of breath.   Cardiovascular: Negative for chest pain, palpitations and leg swelling.  Gastrointestinal: Positive for constipation. Negative for heartburn, nausea and vomiting.  Genitourinary: Negative for dysuria, flank pain and hematuria.  Musculoskeletal: Positive for joint pain and myalgias.  Skin: Negative for rash.  Neurological: Positive for dizziness, sensory change,  speech change and weakness.  All other systems reviewed and are negative.  Past Medical History:  Diagnosis Date  . Migraines   . Stroke (cerebrum) [facility])    Past Surgical History:  Procedure Laterality Date  . BUBBLE STUDY  10/06/2019   Procedure: BUBBLE STUDY;  Surgeon: Quintella Reichert, MD;  Location: [facility] ENDOSCOPY;  Service: Cardiovascular;;  . IR CT HEAD LTD  10/03/2019  . IR INTRA CRAN STENT  10/03/2019  . IR PATIENT EVAL TECH 0-60 MINS  10/03/2019  . IR PERCUTANEOUS ART THROMBECTOMY/INFUSION INTRACRANIAL INC DIAG ANGIO  10/03/2019  . RADIOLOGY WITH ANESTHESIA N/A 10/02/2019   Procedure: IR WITH ANESTHESIA;  Surgeon: Julieanne Cotton, MD;  Location: [facility] OR;  Service: Radiology;  Laterality: N/A;  . TEE WITHOUT CARDIOVERSION N/A 10/06/2019   Procedure: TRANSESOPHAGEAL ECHOCARDIOGRAM (TEE);  Surgeon: Quintella Reichert, MD;  Location: [facility] ENDOSCOPY;  Service: Cardiovascular;  Laterality: N/A;   No family history on file. Social History:  reports that she has never smoked. She has never used smokeless tobacco. She reports previous alcohol use. She reports previous drug use. Allergies: No Known Allergies Medications Prior to Admission  Medication Sig Dispense Refill  . amantadine (SYMMETREL) 100 MG capsule Take 1 capsule (100 mg total) by mouth daily. 30 capsule 1  . aspirin EC 325 MG EC tablet Take 1 tablet (325 mg total) by mouth daily. 30 tablet 0  . atorvastatin (LIPITOR) 80 MG tablet Take 1 tablet (80 mg total) by mouth daily at 6 PM. 30 tablet 1  . clopidogrel (PLAVIX) 75 MG tablet Take 1 tablet (75 mg total) by mouth daily. 30 tablet 1  . ibuprofen (ADVIL) 200 MG tablet  Take 200 mg by mouth every 6 (six) hours as needed for mild pain or moderate pain.    . midodrine (PROAMATINE) 5 MG tablet Take 1 tablet (5 mg total) by mouth 3 (three) times daily with meals. 90 tablet 1    Drug Regimen Review Drug regimen was reviewed and remains appropriate with no significant issues  identified  Home: Home Living Family/patient expects to be discharged to:: Private residence Living Arrangements: Spouse/significant other Available Help at Discharge: Family, Available 24 hours/day Type of Home: House Home Access: Stairs to enter [employer] of Steps: 1 Home Layout: One level Bathroom Shower/Tub: Tub/shower unit, Sport and exercise psychologist: Standard Home Equipment: Environmental consultant - 2 wheels, Tub bench  Lives With: Spouse   Functional History: Prior Function Level of Independence: Independent Comments: independent prior to first CVA last week. Was keeping her grandkids. Was ambulatory when she went home last week and had use of RUE  Functional Status:  Mobility: Bed Mobility General bed mobility comments: pt received in chair Transfers Overall transfer level: Needs assistance Equipment used: 2 person hand held assist Transfers: Sit to/from Stand Sit to Stand: Mod assist, +2 physical assistance General transfer comment: mod A +2 for power up and R sided support due to buckling of R knee Ambulation/Gait Ambulation/Gait assistance: Max assist, +2 physical assistance Gait Distance (Feet): 14 Feet(6', 8') Assistive device: 2 person hand held assist Gait Pattern/deviations: Step-to pattern, Decreased weight shift to right, Decreased step length - right, Scissoring, Decreased dorsiflexion - right General Gait Details: scissoring RLE with foot strike in inversion and R knee buckling in stance.  Gait velocity: decreased Gait velocity interpretation: <1.31 ft/sec, indicative of household ambulator    ADL: ADL Overall ADL's : Needs assistance/impaired Eating/Feeding: Minimal assistance, Sitting Grooming: Minimal assistance, Sitting Upper Body Bathing: Moderate assistance, Sitting Lower Body Bathing: Maximal assistance, +2 for physical assistance, Sit to/from stand Upper Body Dressing : Moderate assistance, Sitting Lower Body Dressing: Maximal assistance, +2 for  physical assistance, Sit to/from stand Lower Body Dressing Details (indicate cue type and reason): pt able to utilize figure 4 and using L hand to don socks, requires assist/support to maintain LE in figure 4 position especially RLE Toilet Transfer: Maximal assistance, +2 for physical assistance, Ambulation Toilet Transfer Details (indicate cue type and reason): simulated via room level mobility, to/from recliner with seated rest in between Toileting- Clothing Manipulation and Hygiene: Maximal assistance, +2 for physical assistance, Sitting/lateral lean, Sit to/from stand Functional mobility during ADLs: Maximal assistance, +2 for physical assistance(HHA) General ADL Comments: pt with notable R side weakness, impaired cognition, decreased standing balacne and functional performance   Cognition: Cognition Overall Cognitive Status: Impaired/Different from baseline Orientation Level: Oriented X4 Cognition Arousal/Alertness: Awake/alert Behavior During Therapy: Flat affect Overall Cognitive Status: Impaired/Different from baseline Area of Impairment: Safety/judgement, Problem solving, Following commands Current Attention Level: Sustained Following Commands: Follows one step commands consistently Safety/Judgement: Decreased awareness of safety, Decreased awareness of deficits Awareness: Intellectual Problem Solving: Requires verbal cues, Slow processing General Comments: pt appropriately emotional about worsening of deficits  Physical Exam: Blood pressure (!) 141/72, pulse (!) 51, temperature (!) 97 F (36.1 C), temperature source Oral, resp. rate 16, SpO2 98 %. Nursing note and vitals reviewed. Constitutional: She appears well-developed and well-nourished. Awake and alert with good energy but flat affect. Daughter and husband are at bedside.   HENT:  Head: Normocephalic and atraumatic.  Nose: Nose normal.  Mouth/Throat: Oropharynx is clear and moist. No oropharyngeal exudate.  Mild R  facial  droop; Facial sensation intact; tongue- midline.   Eyes: Pupils are equal, round, and reactive to light. Conjunctivae are normal. Right eye exhibits no discharge. Left eye exhibits no discharge.  EOMI B/L- no nystagmus seen today.   Neck: No tracheal deviation present.  Cardiovascular:  No JVD; RRR  Respiratory: No stridor.  CTA B/L- no W/R/R; no resp distress  GI:  Soft, NT< ND, (+)protuberant; hypoactive BS  Musculoskeletal:     Cervical back: Normal range of motion and neck supple.     Comments: RUE deltoid, biceps, triceps, WE, grip and finger abd 0/5 EAV:WUJW muscles 5/5.  RLE: HF, KE, KF, DF and PF 3/5 LLE: 5/5 in same muscles  Neurological: Sensation intact to light touch in all 4 extremities No clonus in LEs; no increased tone;   Psychiatric:  Calm, flat affect  Results for orders placed or performed during the hospital encounter of 10/09/19 (from the past 48 hour(s))  Basic metabolic panel     Status: Abnormal   Collection Time: 10/10/19  3:41 AM  Result Value Ref Range   Sodium 142 135 - 145 mmol/L   Potassium 3.3 (L) 3.5 - 5.1 mmol/L   Chloride 108 98 - 111 mmol/L   CO2 21 (L) 22 - 32 mmol/L   Glucose, Bld 80 70 - 99 mg/dL    Comment: Glucose reference range applies only to samples taken after fasting for at least 8 hours.   BUN 12 6 - 20 mg/dL   Creatinine, Ser 0.84 0.44 - 1.00 mg/dL   Calcium 9.0 8.9 - 10.3 mg/dL   GFR calc non Af Amer >60 >60 mL/min   GFR calc Af Amer >60 >60 mL/min   Anion gap 13 5 - 15    Comment: Performed at [facility] [address], [place], [place] [address]  CBC     Status: Abnormal   Collection Time: 10/10/19  3:41 AM  Result Value Ref Range   WBC 8.9 4.0 - 10.5 K/uL   RBC 3.57 (L) 3.87 - 5.11 MIL/uL   Hemoglobin 11.1 (L) 12.0 - 15.0 g/dL   HCT 33.0 (L) 36.0 - 46.0 %   MCV 92.4 80.0 - 100.0 fL   MCH 31.1 26.0 - 34.0 pg   MCHC 33.6 30.0 - 36.0 g/dL   RDW 12.5 11.5 - 15.5 %   Platelets 400 150 - 400 K/uL   nRBC  0.0 0.0 - 0.2 %    Comment: Performed at [facility], [place] [address], [place], [place] [address]   MR BRAIN WO CONTRAST  Result Date: 10/09/2019 CLINICAL DATA:  Stroke. EXAM: MRI HEAD WITHOUT CONTRAST TECHNIQUE: Multiplanar, multiecho pulse sequences of the brain and surrounding structures were obtained without intravenous contrast. COMPARISON:  MRI 10/04/2019 FINDINGS: Brain: New area of acute infarct in the left superior temporal lobe. No change in acute infarct in the left lenticular nuclei and in the left frontal white matter. Small areas of acute infarct in the left insula unchanged. Ventricle size normal. Mild chronic microvascular ischemic change in the white matter. No acute hemorrhage or mass. Vascular: Left MCA stent noted. Increased signal on FLAIR and left MCA branches may reflect slow flow or occlusion. CTA today demonstrates left MCA thrombus with decreased flow Normal arterial flow void right vertebral artery as well as in the vertebrobasilar arteries. Skull and upper cervical spine: No focal skeletal lesion. Sinuses/Orbits: Mucosal edema paranasal sinuses.  Negative orbit Other: None IMPRESSION: Acute infarct left MCA territory.  New area of acute infarct left superior temporal lobe compared with the prior MRI 10/03/2019. Abnormal signal in the left MCA branches compatible with slow flow or occlusion. See CTA result from earlier today. No acute intracranial hemorrhage. Electronically Signed   By: Marlan Palau M.D.   On: 10/09/2019 15:48       Medical Problem List and Plan: 1.  Right side weakness with expressive aphasia secondary to acute left temporal lobe infarction d/t new in-stent thrombosis with recent left MCA stent  -patient may shower  -ELOS/Goals: 7-10 days 2.  Antithrombotics: -DVT/anticoagulation: Lovenox 40 mg daily  -antiplatelet therapy: Aspirin 325 mg daily, Brilinta 90 mg twice daily 3. Pain Management: Tylenol as needed 4. Mood: Amantadine 100 mg  daily  -antipsychotic agents: N/A  -Denies depressed mood at this time but has flat affect and paucity of speech. Advised patient that depression is very common post-stroke and to let us know if she is feeling such symptoms so we can help her.  5. Neuropsych: This patient is capable of making decisions on her own behalf. 6. Skin/Wound Care: Routine skin checks 7. Fluids/Electrolytes/Nutrition: Routine in and outs with follow-up chemistries 8.  Orthostasis.  ProAmatine 10 mg 3 times daily.  Monitor with increased mobility 9.  Hyperlipidemia.  Lipitor 10. Disposition: Has follow-up with EP cardiology Gypsy Balsam on 11/09/19 at 12pm to discuss loop recorder placement.   Mcarthur Rossetti Angiulli, PA-C 10/11/2019   I have personally performed a face to face diagnostic evaluation, including, but not limited to relevant history and physical exam findings, of this patient and developed relevant assessment and plan.  Additionally, I have reviewed and concur with the physician assistant's documentation above.  Sula Soda, MD

## 2019-10-11 NOTE — H&P (Signed)
Physical Medicine and Rehabilitation Admission H&P  CC: Left MCA stroke  HPI: Erin Peck is a 54 year old right-handed female with history of migraine headaches.  She lives with spouse independent prior to admission.  1 level home one-step to entry.  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  She was discharged to home after initially declining inpatient rehab services.  Presented 10/09/2019 with increased right side weakness.  CT/MRI showed acute infarction left MCA territory.  New area of acute infarction left superior temporal lobe compared to prior MRI of 10/03/2019.  Abnormal signal in the left MCA branches compatible with slow flow or occlusion.  CT angiogram of head and neck recent Y stenting of left M1 and proximal M2 vessels.. No new site of embolic disease.  Most recent echocardiogram with ejection fraction of 65%.  Neurology consulted currently maintained on aspirin 325 mg daily as well as the addition of Brilinta.  Subcutaneous Lovenox for DVT prophylaxis.  Close monitoring of blood pressure and maintain on ProAmatine.  Tolerating a regular diet.  Therapy evaluations completed and patient was admitted for a comprehensive rehab program  Review of Systems  Constitutional: Negative for chills and fever.  HENT: Negative for hearing loss.   Eyes: Negative for blurred vision and double vision.  Respiratory: Negative for cough and shortness of breath.   Cardiovascular: Negative for chest pain, palpitations and leg swelling.  Gastrointestinal: Positive for constipation. Negative for heartburn, nausea and vomiting.  Genitourinary: Negative for dysuria, flank pain and hematuria.  Musculoskeletal: Positive for joint pain and myalgias.  Skin: Negative for rash.  Neurological: Positive for dizziness, sensory change, speech change and weakness.  All other  systems reviewed and are negative.  Past Medical History:  Diagnosis Date  . Migraines   . Stroke (cerebrum) [facility])    Past Surgical History:  Procedure Laterality Date  . BUBBLE STUDY  10/06/2019   Procedure: BUBBLE STUDY;  Surgeon: Sueanne Margarita, MD;  Location: [place];  Service: Cardiovascular;;  . IR CT HEAD LTD  10/03/2019  . IR INTRA CRAN STENT  10/03/2019  . IR PATIENT EVAL TECH 0-60 MINS  10/03/2019  . IR PERCUTANEOUS ART THROMBECTOMY/INFUSION INTRACRANIAL INC DIAG ANGIO  10/03/2019  . RADIOLOGY WITH ANESTHESIA N/A 10/02/2019   Procedure: IR WITH ANESTHESIA;  Surgeon: Luanne Bras, MD;  Location: [place];  Service: Radiology;  Laterality: N/A;  . TEE WITHOUT CARDIOVERSION N/A 10/06/2019   Procedure: TRANSESOPHAGEAL ECHOCARDIOGRAM (TEE);  Surgeon: Sueanne Margarita, MD;  Location: [facility] ENDOSCOPY;  Service: Cardiovascular;  Laterality: N/A;   No family history on file. Social History:  reports that she has never smoked. She has never used smokeless tobacco. She reports previous alcohol use. She reports previous drug use. Allergies: No Known Allergies Medications Prior to Admission  Medication Sig Dispense Refill  . amantadine (SYMMETREL) 100 MG capsule Take 1 capsule (100 mg total) by mouth daily. 30 capsule 1  . aspirin EC 325 MG EC tablet Take 1 tablet (325 mg total) by mouth daily. 30 tablet 0  . atorvastatin (LIPITOR) 80 MG tablet Take 1 tablet (80 mg total) by mouth daily at 6 PM. 30 tablet 1  . ibuprofen (ADVIL) 200 MG tablet Take 200 mg by mouth every 6 (six) hours as needed for mild pain or moderate pain.    . midodrine (PROAMATINE) 5 MG tablet Take 2  tablets (10 mg total) by mouth 3 (three) times daily with meals. 90 tablet 1  . ticagrelor (BRILINTA) 90 MG TABS tablet Take 1 tablet (90 mg total) by mouth 2 (two) times daily. 60 tablet 0    Drug Regimen Review Drug regimen was reviewed and remains appropriate with no significant issues identified  Home: Home  Living Family/patient expects to be discharged to:: Private residence Living Arrangements: Spouse/significant other Available Help at Discharge: Family, Available 24 hours/day Type of Home: House Home Access: Stairs to enter [employer] of Steps: 1 Home Layout: One level Bathroom Shower/Tub: Tub/shower unit, Sport and exercise psychologist: Standard Home Equipment: Environmental consultant - 2 wheels, Tub bench  Lives With: Spouse   Functional History: Prior Function Level of Independence: Independent Comments: independent prior to first CVA last week. Was keeping her grandkids. Was ambulatory when she went home last week and had use of RUE  Functional Status:  Mobility: Bed Mobility General bed mobility comments: pt received in chair Transfers Overall transfer level: Needs assistance Equipment used: 2 person hand held assist Transfers: Sit to/from Stand Sit to Stand: Mod assist, +2 physical assistance General transfer comment: mod A +2 for power up and R sided support due to buckling of R knee Ambulation/Gait Ambulation/Gait assistance: Max assist, +2 physical assistance Gait Distance (Feet): 14 Feet(6', 8') Assistive device: 2 person hand held assist Gait Pattern/deviations: Step-to pattern, Decreased weight shift to right, Decreased step length - right, Scissoring, Decreased dorsiflexion - right General Gait Details: scissoring RLE with foot strike in inversion and R knee buckling in stance.  Gait velocity: decreased Gait velocity interpretation: <1.31 ft/sec, indicative of household ambulator  ADL: ADL Overall ADL's : Needs assistance/impaired Eating/Feeding: Minimal assistance, Sitting Grooming: Minimal assistance, Sitting Upper Body Bathing: Moderate assistance, Sitting Lower Body Bathing: Maximal assistance, +2 for physical assistance, Sit to/from stand Upper Body Dressing : Moderate assistance, Sitting Lower Body Dressing: Maximal assistance, +2 for physical assistance, Sit  to/from stand Lower Body Dressing Details (indicate cue type and reason): pt able to utilize figure 4 and using L hand to don socks, requires assist/support to maintain LE in figure 4 position especially RLE Toilet Transfer: Maximal assistance, +2 for physical assistance, Ambulation Toilet Transfer Details (indicate cue type and reason): simulated via room level mobility, to/from recliner with seated rest in between Toileting- Clothing Manipulation and Hygiene: Maximal assistance, +2 for physical assistance, Sitting/lateral lean, Sit to/from stand Functional mobility during ADLs: Maximal assistance, +2 for physical assistance(HHA) General ADL Comments: pt with notable R side weakness, impaired cognition, decreased standing balacne and functional performance   Cognition: Cognition Overall Cognitive Status: Impaired/Different from baseline Orientation Level: Oriented X4 Cognition Arousal/Alertness: Awake/alert Behavior During Therapy: Flat affect Overall Cognitive Status: Impaired/Different from baseline Area of Impairment: Safety/judgement, Problem solving, Following commands Current Attention Level: Sustained Following Commands: Follows one step commands consistently Safety/Judgement: Decreased awareness of safety, Decreased awareness of deficits Awareness: Intellectual Problem Solving: Requires verbal cues, Slow processing General Comments: pt appropriately emotional about worsening of deficits   Physical Exam: There were no vitals taken for this visit. Nursing note and vitals reviewed. Constitutional: She appears well-developed and well-nourished. Awake and alert with good energy but flat affect. Daughter and husband are at bedside.   HENT:  Head: Normocephalic and atraumatic.  Nose: Nose normal.  Mouth/Throat: Oropharynx is clear and moist. No oropharyngeal exudate.  Mild R facial droop; Facial sensation intact; tongue- midline.   Eyes: Pupils are equal, round, and reactive to  light. Conjunctivae are normal.  Right eye exhibits no discharge. Left eye exhibits no discharge.  EOMI B/L- no nystagmus seen today.   Neck: No tracheal deviation present.  Cardiovascular:  No JVD; RRR  Respiratory: No stridor.  CTA B/L- no W/R/R; no resp distress  GI:  Soft, NT< ND, (+)protuberant; hypoactive BS  Musculoskeletal:     Cervical back: Normal range of motion and neck supple.     Comments: RUE deltoid, biceps, triceps, WE, grip and finger abd 0/5 YQI:HKVQ muscles 5/5.  RLE: HF, KE, KF, DF and PF 3/5 LLE: 5/5 in same muscles  Neurological: Sensation intact to light touch in all 4 extremities No clonus in LEs; no increased tone;   Psychiatric:  Calm, flat affect  Results for orders placed or performed during the hospital encounter of 10/09/19 (from the past 48 hour(s))  Basic metabolic panel     Status: Abnormal   Collection Time: 10/10/19  3:41 AM  Result Value Ref Range   Sodium 142 135 - 145 mmol/L   Potassium 3.3 (L) 3.5 - 5.1 mmol/L   Chloride 108 98 - 111 mmol/L   CO2 21 (L) 22 - 32 mmol/L   Glucose, Bld 80 70 - 99 mg/dL    Comment: Glucose reference range applies only to samples taken after fasting for at least 8 hours.   BUN 12 6 - 20 mg/dL   Creatinine, Ser 2.59 0.44 - 1.00 mg/dL   Calcium 9.0 8.9 - 56.3 mg/dL   GFR calc non Af Amer >60 >60 mL/min   GFR calc Af Amer >60 >60 mL/min   Anion gap 13 5 - 15    Comment: Performed at [facility], [address] [place], [place] [address]  CBC     Status: Abnormal   Collection Time: 10/10/19  3:41 AM  Result Value Ref Range   WBC 8.9 4.0 - 10.5 K/uL   RBC 3.57 (L) 3.87 - 5.11 MIL/uL   Hemoglobin 11.1 (L) 12.0 - 15.0 g/dL   HCT 33.2 (L) 95.1 - 88.4 %   MCV 92.4 80.0 - 100.0 fL   MCH 31.1 26.0 - 34.0 pg   MCHC 33.6 30.0 - 36.0 g/dL   RDW 16.6 06.3 - 01.6 %   Platelets 400 150 - 400 K/uL   nRBC 0.0 0.0 - 0.2 %    Comment: Performed at [facility], [address], [place], [place] [address]    No results found.     Medical Problem List and Plan: 1.  Right side weakness with expressive aphasia secondary to acute left temporal lobe infarction d/t new in-stent thrombosis with recent left MCA stent  -patient may shower  -ELOS/Goals: 10-14 days 2.  Antithrombotics: -DVT/anticoagulation: Lovenox 40 mg daily  -antiplatelet therapy: Aspirin 325 mg daily, Brilinta 90 mg twice daily 3. Pain Management: Tylenol as needed 4. Mood: Amantadine 100 mg daily  -antipsychotic agents: N/A  -Denies depressed mood at this time but has flat affect and paucity of speech. Advised patient that depression is very common post-stroke and to let us know if she is feeling such symptoms. 5. Neuropsych: This patient is capable of making decisions on her own behalf. 6. Skin/Wound Care: Routine skin checks 7. Fluids/Electrolytes/Nutrition: Routine in and outs with follow-up chemistries 8.  Orthostasis.  ProAmatine 10 mg 3 times daily.  Monitor with increased mobility 9.  Hyperlipidemia.  Lipitor 10. Disposition: Has follow-up with EP cardiology on 11/09/19 to discuss loop recorder placement.   Horton Chin,  MD 10/11/2019   I have personally performed a face to face diagnostic evaluation, including, but not limited to relevant history and physical exam findings, of this patient and developed relevant assessment and plan.  Additionally, I have reviewed and concur with the physician assistant's documentation above.  Sula Soda, MD

## 2019-10-11 NOTE — TOC Transition Note (Signed)
Transition of Care [facility]) - CM/SW Discharge Note   Patient Details  Name: THAILA BOTTOMS MRN: [id number] Date of Birth: [birth year]  Transition of Care [facility]) CM/SW Contact:  Kermit Balo, RN Phone Number: 10/11/2019, 2:26 PM   Clinical Narrative:    Pt discharging to CIR today. CM signing off.    Final next level of care: IP Rehab Facility Barriers to Discharge: Inadequate or no insurance, Barriers Unresolved (comment)   Patient Goals and CMS Choice        Discharge Placement                       Discharge Plan and Services                                     Social Determinants of Health (SDOH) Interventions     Readmission Risk Interventions No flowsheet data found.

## 2019-10-11 NOTE — Progress Notes (Signed)
PMR Admission Coordinator Pre-Admission Assessment   Patient: Erin Peck is an 54 y.o., female MRN: [id number] DOB: 1965-08-05 Height:   Weight:     Insurance Information HMO:     PPO:      PCP:      IPA:      80/20:      OTHER:  PRIMARY: Uninsured. Medicaid application application pending through [facility].       Policy#:       Subscriber:  CM Name:       Phone#:     Fax#:  Pre-Cert#:       Employer:  Benefits:  Phone #:      Name:  Eff. Date:      Deduct:       Out of Pocket Max:       Life Max:  CIR:       SNF:  Outpatient:      Co-Pay:  Home Health:       Co-Pay:  DME:      Co-Pay:  Providers:  SECONDARY:       Policy#:      Subscriber:  CM Name:       Phone#:      Fax#:  Pre-Cert#:       Employer:  Benefits:  Phone #:      Name:  Eff. Date:      Deduct:       Out of Pocket Max:      Life Max:  CIR:       SNF:  Outpatient:      Co-Pay:  Home Health:       Co-Pay:  DME:      Co-Pay:    Medicaid Application Date: 10/15/00      Case Manager: Family coordinating with [facility] Disability Application Date:       Case Worker:    The "Data Collection Information Summary" for patients in Inpatient Rehabilitation Facilities with attached "Privacy Act [place] Records" was provided and verbally reviewed with: N/A   Emergency Contact Information         Contact Information     Name Relation Home Work Mobile    [facility] Spouse     [phone]    Shelton,[name] Daughter     [phone]         Current Medical History  Patient Admitting Diagnosis: Left MCA CVA History of Present Illness: Erin Peck is a 54 year old right-handed female with history of migraine headaches.  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  She was discharged to home after initially declining inpatient rehab services.  Presented  10/09/2019 with increased right side weakness.  CT/MRI showed acute infarction left MCA territory.  New area of acute infarction left superior temporal lobe compared to prior MRI of 10/03/2019.  Abnormal signal in the left MCA branches compatible with slow flow or occlusion.  CT angiogram of head and neck recent Y stenting of left M1 and proximal M2 vessels.. No new site of embolic disease.  Most recent echocardiogram with ejection fraction of 65%.  Neurology consulted currently maintained on aspirin 325 mg daily as well as the addition of Brilinta.  Subcutaneous Lovenox for DVT prophylaxis.  Close monitoring of blood pressure and maintain on ProAmatine.  Tolerating a regular diet.  Therapy evaluations completed and patient was recommended for a  comprehensive rehab program   Complete NIHSS TOTAL: 12   Patient's medical record [facility] has been reviewed by the rehabilitation admission coordinator and physician.   Past Medical History      Past Medical History:  Diagnosis Date  . Migraines    . Stroke (cerebrum) [facility])        Family History   family history is not on file.   Prior Rehab/Hospitalizations Has the patient had prior rehab or hospitalizations prior to admission? Yes   Has the patient had major surgery during 100 days prior to admission? Yes              Current Medications   Current Facility-Administered Medications:  .  amantadine (SYMMETREL) capsule 100 mg, 100 mg, Oral, Daily, Rosalin Hawking, MD, 100 mg at 10/11/19 1128 .  aspirin tablet 325 mg, 325 mg, Oral, Daily, Seawell, Jaimie A, DO, 325 mg at 10/11/19 1129 .  atorvastatin (LIPITOR) tablet 80 mg, 80 mg, Oral, q1800, Seawell, Jaimie A, DO, 80 mg at 10/10/19 1629 .  enoxaparin (LOVENOX) injection 40 mg, 40 mg, Subcutaneous, Q24H, Seawell, Jaimie A, DO, 40 mg at 10/10/19 1628 .  midodrine (PROAMATINE) tablet 10 mg, 10 mg, Oral, TID WC, Seawell, Jaimie A, DO, 10 mg at 10/11/19 1128 .  sodium chloride flush (NS)  0.9 % injection 3 mL, 3 mL, Intravenous, Once, Carmin Muskrat, MD .  ticagrelor [facility]) tablet 90 mg, 90 mg, Oral, BID, Seawell, Jaimie A, DO, 90 mg at 10/11/19 1129   Patients Current Diet:     Diet Order                      Diet Heart Room service appropriate? Yes; Fluid consistency: Thin  Diet effective now                   Precautions / Restrictions Precautions Precautions: Fall Restrictions Weight Bearing Restrictions: No    Has the patient had 2 or more falls or a fall with injury in the past year? Yes   Prior Activity Level Community (5-7x/wk): Pt was driving. watching her 19 year old grandson   Prior Functional Level Self Care: Did the patient need help bathing, dressing, using the toilet or eating? Independent   Indoor Mobility: Did the patient need assistance with walking from room to room (with or without device)? Independent   Stairs: Did the patient need assistance with internal or external stairs (with or without device)? Independent   Functional Cognition: Did the patient need help planning regular tasks such as shopping or remembering to take medications? Independent   Home Assistive Devices / Equipment Home Equipment: Walker - 2 wheels, Tub bench   Prior Device Use: Indicate devices/aids used by the patient prior to current illness, exacerbation or injury? None of the above   Current Functional Level Cognition   Overall Cognitive Status: Impaired/Different from baseline Current Attention Level: Sustained Orientation Level: Oriented X4 Following Commands: Follows one step commands consistently Safety/Judgement: Decreased awareness of safety, Decreased awareness of deficits General Comments: pt appropriately emotional about worsening of deficits    Extremity Assessment (includes Sensation/Coordination)   Upper Extremity Assessment: RUE deficits/detail RUE Deficits / Details: pt with shoulder shrug but no other muscle activation noted with ROM   RUE Sensation: decreased light touch, decreased proprioception RUE Coordination: decreased fine motor, decreased gross motor  Lower Extremity Assessment: Defer to PT evaluation RLE Deficits / Details: pt unable to df R ankle,  moves into inversion when she attempts df. Hip flex 2-/5. R knee buckling in [place].  RLE Sensation: decreased proprioception RLE Coordination: decreased fine motor, decreased gross motor LLE Deficits / Details: WFL     ADLs   Overall ADL's : Needs assistance/impaired Eating/Feeding: Minimal assistance, Sitting Grooming: Minimal assistance, Sitting Upper Body Bathing: Moderate assistance, Sitting Lower Body Bathing: Maximal assistance, +2 for physical assistance, Sit to/from stand Upper Body Dressing : Moderate assistance, Sitting Lower Body Dressing: Maximal assistance, +2 for physical assistance, Sit to/from stand Lower Body Dressing Details (indicate cue type and reason): pt able to utilize figure 4 and using L hand to don socks, requires assist/support to maintain LE in figure 4 position especially RLE Toilet Transfer: Maximal assistance, +2 for physical assistance, Ambulation Toilet Transfer Details (indicate cue type and reason): simulated via room level mobility, to/from recliner with seated rest in between Toileting- Clothing Manipulation and Hygiene: Maximal assistance, +2 for physical assistance, Sitting/lateral lean, Sit to/from stand Functional mobility during ADLs: Maximal assistance, +2 for physical assistance(HHA) General ADL Comments: pt with notable R side weakness, impaired cognition, decreased standing balacne and functional performance      Mobility   General bed mobility comments: pt received in chair     Transfers   Overall transfer level: Needs assistance Equipment used: 2 person hand held assist Transfers: Sit to/from Stand Sit to Stand: Mod assist, +2 physical assistance General transfer comment: mod A +2 for power up and R sided support  due to buckling of R knee     Ambulation / Gait / Stairs / Wheelchair Mobility   Ambulation/Gait Ambulation/Gait assistance: Max assist, +2 physical assistance Gait Distance (Feet): 14 Feet(6', 8') Assistive device: 2 person hand held assist Gait Pattern/deviations: Step-to pattern, Decreased weight shift to right, Decreased step length - right, Scissoring, Decreased dorsiflexion - right General Gait Details: scissoring RLE with foot strike in inversion and R knee buckling in stance.  Gait velocity: decreased Gait velocity interpretation: <1.31 ft/sec, indicative of household ambulator     Posture / Balance Balance Overall balance assessment: Needs assistance Sitting-balance support: No upper extremity supported, Feet supported Sitting balance-Leahy Scale: Fair Standing balance support: Bilateral upper extremity supported, During functional activity Standing balance-Leahy Scale: Poor Standing balance comment: reliant on external support     Special needs/care consideration BiPAP/CPAP  CPM  Continuous Drip IV  Dialysis         Days  Life Vest  Oxygen  Special Bed  Trach Size  Wound Vac (area)       Location  Skin                            Location  Bowel mgmt: no BM this admission Bladder mgmt: incontinent Diabetic mgmt: no Behavioral consideration  Chemo/radiation     Previous Home Environment (from acute therapy documentation) Living Arrangements: Spouse/significant other  Lives With: Spouse Available Help at Discharge: Family, Available 24 hours/day Type of Home: House Home Layout: One level Home Access: Stairs to enter [employer] of Steps: 1 Bathroom Shower/Tub: Public librarian, Charity fundraiser: Standard   Discharge Living Setting Type of Home at Discharge: Mobile home Discharge Home Layout: One level Discharge Home Access: Level entry Discharge Bathroom Shower/Tub: Tub/shower unit Discharge Bathroom Toilet: Standard Discharge Bathroom  Accessibility: Yes How Accessible: Accessible via walker Does the patient have any problems obtaining your medications?: Yes (Describe)(uninsured)   Social/Family/Support Systems Patient Roles: Spouse, Caregiver(babysits 2 year  old grandson) Anticipated Caregiver: spouse and daughter Anticipated Caregiver's Contact Information: Marlie Kuennen [phone]), Caroline More [phone]) Ability/Limitations of Caregiver: Mod A Caregiver Availability: 24/7 Discharge Plan Discussed with Primary Caregiver: Yes Is Caregiver In Agreement with Plan?: Yes Does Caregiver/Family have Issues with Lodging/Transportation while Pt is in Rehab?: No   Goals/Additional Needs Patient/Family Goal for Rehab: PT/OT/SLP supervision Expected length of stay: 3 weeks Dietary Needs: HH, thin liquids Pt/Family Agrees to Admission and willing to participate: Yes Program Orientation Provided & Reviewed with Pt/Caregiver Including Roles  & Responsibilities: Yes  Barriers to Discharge: Insurance for SNF coverage   Decrease burden of Care through IP rehab admission: NA   Possible need for SNF placement upon discharge: NA   Patient Condition: I have reviewed medical records from [facility], spoken with CM, and patient, spouse and daughter. I met with patient at the bedside for inpatient rehabilitation assessment.  Patient will benefit from ongoing PT, OT and SLP, can actively participate in 3 hours of therapy a day 5 days of the week, and can make measurable gains during the admission.  Patient will also benefit from the coordinated team approach during an Inpatient Acute Rehabilitation admission.  The patient will receive intensive therapy as well as Rehabilitation physician, nursing, social worker, and care management interventions.  Due to safety, disease management, medication administration, pain management and patient education the patient requires 24 hour a day rehabilitation nursing.  The patient is currently max A  with mobility and basic ADLs.  Discharge setting and therapy post discharge at home with home health is anticipated.  Patient has agreed to participate in the Acute Inpatient Rehabilitation Program and will admit today.   Preadmission Screen Completed By:  Bethel Born, MS, CCC-SLP 10/11/2019 1:46 PM ______________________________________________________________________   Discussed status with Dr. Ranell Patrick on 10/11/19  at 2:03 PM  and received approval for admission today.   Admission Coordinator: Gayland Curry, MS, CCC-SLP , time 2:04 PM /Date 10/11/19     Assessment/Plan: Diagnosis: L MCA occlusion/infarct s/p tPA and thrombectomy 1. Does the need for close, 24 hr/day medical supervision in concert with the patient's rehab needs make it unreasonable for this patient to be served in a less intensive setting? Yes 2. Co-Morbidities requiring supervision/potential complications: migraine, dysphagia, aphasia; HTN 3. Due to bladder management, bowel management, safety, skin/wound care, disease management, medication administration and patient education, does the patient require 24 hr/day rehab nursing? Yes 4. Does the patient require coordinated care of a physician, rehab nurse, therapy disciplines of PT, OT and SLP to address physical and functional deficits in the context of the above medical diagnosis(es)? Yes Addressing deficits in the following areas: balance, endurance, locomotion, strength, transferring, bathing, dressing, feeding, grooming, toileting, cognition, speech, language, swallowing and psychosocial support 5. Can the patient actively participate in an intensive therapy program of at least 3 hrs of therapy per day at least 5 days per week? Yes 6. The potential for patient to make measurable gains while on inpatient rehab is excellent 7. Anticipated functional outcomes upon discharge from inpatient rehab are modified independent  with PT, modified independent with OT,  supervision and min assist with SLP. 8. Estimated rehab length of stay to reach the above functional goals is: 7-10 days 9. Anticipated discharge destination: Home 10. Overall Rehab/Functional Prognosis: excellent   MD Signature: Leeroy Cha, MD        Cosigned by: Izora Ribas, MD at 10/11/2019  2:34 PM

## 2019-10-11 NOTE — Progress Notes (Signed)
   Subjective: Pt seen at the bedside this morning. Feeling well. Would like to discuss with her daughter today before making a decision regarding CIR. Discussed the recommendation by both PT/OT and the medical team for pt to continue rehab at [facility] to work on improving her strength and functional status. Pt expressed understanding and said she would get back to the team later today. Pt without acute concerns at this time.  Objective:  Vital signs in last 24 hours: Vitals:   10/10/19 2016 10/10/19 2300 10/11/19 0016 10/11/19 0348  BP: (!) 133/52  (!) 114/54 (!) 113/57  Pulse: 73  68 64  Resp: 17 19 16 16   Temp: 98.4 F (36.9 C)  98.3 F (36.8 C) (!) 97.5 F (36.4 C)  TempSrc: Oral  Oral Oral  SpO2: 95%  98% 97%   Physical Exam Constitutional:      General: She is not in acute distress.    Appearance: She is not ill-appearing.     Comments: Pt sleeping upon arrival to the room. Easily awakens and is oriented.  Pulmonary:     Effort: Pulmonary effort is normal.  Skin:    General: Skin is warm and dry.  Neurological:     Mental Status: She is alert.     Comments: Pt with paucity of speech and without aphasia. R facial droop. RUE 0/5 and RLE 3+/5. Full strength in L side.    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 Gironda is a 54 year old F with significant PMH of recent cryptogenic L MCA stroke one week ago requiring mechanical thrombectomy with intracranial stenting following reocclusion.  Recurrence of L MCA stroke Undetermined etiology for initial stroke. Worsening R sided deficits this presentation and CTA with in-stent thrombus and new infarct. Thought to be secondary to Plavix failure outpatient. - currently on Brilinta 90mg  BID and ASA 325mg , which will be pt's regimen on discharge - follow-up with EP on 11/09/2019 @12pm   to discuss loop recorder  - continue atorvastatin 80mg  daily - midodrine 5mg  TID for soft BP - given stent occlusion goal SBP 120-150 - PMR recommended amantadine 100mg  daily for aphasia recovery - follow-up in stroke clinic in 4 weeks  Pt and family to decided regarding CIR vs home discharge later this morning.  Prior to Admission Living Arrangement: home Anticipated Discharge Location: home vs CIR Barriers to Discharge: pt's decision regarding CIR Dispo: Anticipated discharge in approximately 0-1 day(s).   , MD 10/11/2019, 6:52 AM Pager: [phone]

## 2019-10-11 NOTE — Progress Notes (Signed)
Physical Therapy Treatment Patient Details Name: Erin Peck MRN: [id number] DOB: 1965-11-11 Today's Date: 10/11/2019    History of Present Illness Erin Peck is a 54yo female with recent discharge LMCA stroke s/p tPA and stent placement presenting after waking up this morning with aphasia and right arm and leg weakness. New area of acute infarct left superior temporal lobe compared with the prior MRI 10/03/2019.    PT Comments    Pt tolerated treatment well, improving gait quality and transfer quality during session. PT providing extensive education on gait pattern and HEP for RUE and RLE during session. Pt continues to remain profoundly weak in RUE. Pt will benefit from aggressive mobilization to improve strength, balance, and power. PT continues to recommend high intensity inpatient PT services at the time of discharge to aide in a return to independence.  Follow Up Recommendations  CIR;Supervision/Assistance - 24 hour     Equipment Recommendations  (defer to post-acute setting)    Recommendations for Other Services       Precautions / Restrictions Precautions Precautions: Fall Restrictions Weight Bearing Restrictions: No    Mobility  Bed Mobility                  Transfers Overall transfer level: Needs assistance Equipment used: 1 person hand held assist;Hemi-walker Transfers: Sit to/from Stand Sit to Stand: Min assist            Ambulation/Gait Ambulation/Gait assistance: Mod assist Gait Distance (Feet): 7 Feet(7' x 3 trials) Assistive device: 1 person hand held assist;Hemi-walker Gait Pattern/deviations: Step-to pattern;Narrow base of support Gait velocity: reduced Gait velocity interpretation: <1.31 ft/sec, indicative of household ambulator General Gait Details: 3 trials, initial with PT support and R knee block. 2nd and 3rd with hemi-walker and knee block. PT providing assist for RLE stance width, knee support, and weight shift during gait  phase. Intermittent R foot inversion during swing phase   Stairs             Wheelchair Mobility    Modified Rankin (Stroke Patients Only) Modified Rankin (Stroke Patients Only) Pre-Morbid Rankin Score: Moderately severe disability Modified Rankin: Moderately severe disability     Balance Overall balance assessment: Needs assistance Sitting-balance support: Single extremity supported;Feet supported Sitting balance-Leahy Scale: Fair Sitting balance - Comments: minG-close supervision   Standing balance support: Single extremity supported Standing balance-Leahy Scale: Fair Standing balance comment: minG-minA with LUE support of hemi-walker. Static standing balance                            Cognition Arousal/Alertness: Awake/alert Behavior During Therapy: Flat affect Overall Cognitive Status: Impaired/Different from baseline Area of Impairment: Safety/judgement;Following commands;Problem solving                     Memory: Decreased recall of precautions;Decreased short-term memory Following Commands: Follows one step commands consistently;Follows multi-step commands consistently Safety/Judgement: Decreased awareness of safety;Decreased awareness of deficits   Problem Solving: Requires verbal cues;Requires tactile cues        Exercises Other Exercises Other Exercises: PT provides education on self AAROM of RUE as well as AAROM from family for functional tasks such as feeding, dressing, etc    General Comments General comments (skin integrity, edema, etc.): husband and dtr present, VSS      Pertinent Vitals/Pain Pain Assessment: Faces Faces Pain Scale: Hurts a little bit Pain Location: R hand and foot Pain Descriptors / Indicators: Aching Pain  Intervention(s): Limited activity within patient's tolerance    Home Living                      Prior Function            PT Goals (current goals can now be found in the care plan  section) Acute Rehab PT Goals Patient Stated Goal: Get better Progress towards PT goals: Progressing toward goals    Frequency    Min 4X/week      PT Plan Current plan remains appropriate    Co-evaluation              AM-PAC PT "6 Clicks" Mobility   Outcome Measure  Help needed turning from your back to your side while in a flat bed without using bedrails?: A Little Help needed moving from lying on your back to sitting on the side of a flat bed without using bedrails?: A Little Help needed moving to and from a bed to a chair (including a wheelchair)?: A Lot Help needed standing up from a chair using your arms (e.g., wheelchair or bedside chair)?: A Little Help needed to walk in hospital room?: A Lot Help needed climbing 3-5 steps with a railing? : Total 6 Click Score: 14    End of Session   Activity Tolerance: Patient tolerated treatment well Patient left: in chair;with call bell/phone within reach;with family/visitor present Nurse Communication: Mobility status PT Visit Diagnosis: Hemiplegia and hemiparesis;Other abnormalities of gait and mobility (R26.89);Unsteadiness on feet (R26.81) Hemiplegia - Right/Left: Right Hemiplegia - dominant/non-dominant: Dominant Hemiplegia - caused by: Cerebral infarction     Time: 2694-8546 PT Time Calculation (min) (ACUTE ONLY): 24 min  Charges:  $Gait Training: 23-37 mins                     Zenaida Niece, PT, DPT Acute Rehabilitation Pager: [phone]    Zenaida Niece 10/11/2019, 2:54 PM

## 2019-10-11 NOTE — PMR Pre-admission (Signed)
PMR Admission Coordinator Pre-Admission Assessment  Patient: Erin Peck is an 54 y.o., female MRN: [id number] DOB: [birth year] Height:   Weight:    Insurance Information HMO:     PPO:      PCP:      IPA:      80/20:      OTHER:  PRIMARY: Uninsured. Medicaid application application pending through [facility].       Policy#:       Subscriber:  CM Name:       Phone#:     Fax#:  Pre-Cert#:       Employer:  Benefits:  Phone #:      Name:  Eff. Date:      Deduct:       Out of Pocket Max:       Life Max:  CIR:       SNF:  Outpatient:      Co-Pay:  Home Health:       Co-Pay:  DME:      Co-Pay:  Providers:  SECONDARY:       Policy#:      Subscriber:  CM Name:       Phone#:      Fax#:  Pre-Cert#:       Employer:  Benefits:  Phone #:      Name:  Eff. Date:      Deduct:       Out of Pocket Max:      Life Max:  CIR:       SNF:  Outpatient:      Co-Pay:  Home Health:       Co-Pay:  DME:      Co-Pay:   Medicaid Application Date: 6/31/49      Case Manager: Family coordinating with [facility] Disability Application Date:       Case Worker:   The "Data Collection Information Summary" for patients in Inpatient Rehabilitation Facilities with attached "Privacy Act [place] Records" was provided and verbally reviewed with: N/A  Emergency Contact Information Contact Information    Name Relation Home Work Mobile   [facility] Spouse   [phone]   Shelton,[name] Daughter   [phone]      Current Medical History  Patient Admitting Diagnosis: Left MCA CVA History of Present Illness: Erin Peck is a 54 year old right-handed female with history of migraine headaches.  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  She was discharged to home after initially declining inpatient rehab services.  Presented 10/09/2019 with increased  right side weakness.  CT/MRI showed acute infarction left MCA territory.  New area of acute infarction left superior temporal lobe compared to prior MRI of 10/03/2019.  Abnormal signal in the left MCA branches compatible with slow flow or occlusion.  CT angiogram of head and neck recent Y stenting of left M1 and proximal M2 vessels.. No new site of embolic disease.  Most recent echocardiogram with ejection fraction of 65%.  Neurology consulted currently maintained on aspirin 325 mg daily as well as the addition of Brilinta.  Subcutaneous Lovenox for DVT prophylaxis.  Close monitoring of blood pressure and maintain on ProAmatine.  Tolerating a regular diet.  Therapy evaluations completed and patient was recommended for a comprehensive rehab program  Complete NIHSS TOTAL: 12  Patient's medical record from [facility] has been reviewed by the rehabilitation admission  coordinator and physician.  Past Medical History  Past Medical History:  Diagnosis Date  . Migraines   . Stroke (cerebrum) [facility])     Family History   family history is not on file.  Prior Rehab/Hospitalizations Has the patient had prior rehab or hospitalizations prior to admission? Yes  Has the patient had major surgery during 100 days prior to admission? Yes   Current Medications  Current Facility-Administered Medications:  .  amantadine (SYMMETREL) capsule 100 mg, 100 mg, Oral, Daily, Rosalin Hawking, MD, 100 mg at 10/11/19 1128 .  aspirin tablet 325 mg, 325 mg, Oral, Daily, Seawell, Jaimie A, DO, 325 mg at 10/11/19 1129 .  atorvastatin (LIPITOR) tablet 80 mg, 80 mg, Oral, q1800, Seawell, Jaimie A, DO, 80 mg at 10/10/19 1629 .  enoxaparin (LOVENOX) injection 40 mg, 40 mg, Subcutaneous, Q24H, Seawell, Jaimie A, DO, 40 mg at 10/10/19 1628 .  midodrine (PROAMATINE) tablet 10 mg, 10 mg, Oral, TID WC, Seawell, Jaimie A, DO, 10 mg at 10/11/19 1128 .  sodium chloride flush (NS) 0.9 % injection 3 mL, 3 mL, Intravenous, Once,  Carmin Muskrat, MD .  ticagrelor [facility]) tablet 90 mg, 90 mg, Oral, BID, Seawell, Jaimie A, DO, 90 mg at 10/11/19 1129  Patients Current Diet:  Diet Order            Diet Heart Room service appropriate? Yes; Fluid consistency: Thin  Diet effective now              Precautions / Restrictions Precautions Precautions: Fall Restrictions Weight Bearing Restrictions: No   Has the patient had 2 or more falls or a fall with injury in the past year? Yes  Prior Activity Level Community (5-7x/wk): Pt was driving. watching her 94 year old grandson  Prior Functional Level Self Care: Did the patient need help bathing, dressing, using the toilet or eating? Independent  Indoor Mobility: Did the patient need assistance with walking from room to room (with or without device)? Independent  Stairs: Did the patient need assistance with internal or external stairs (with or without device)? Independent  Functional Cognition: Did the patient need help planning regular tasks such as shopping or remembering to take medications? Independent  Home Assistive Devices / Equipment Home Equipment: Walker - 2 wheels, Tub bench  Prior Device Use: Indicate devices/aids used by the patient prior to current illness, exacerbation or injury? None of the above  Current Functional Level Cognition  Overall Cognitive Status: Impaired/Different from baseline Current Attention Level: Sustained Orientation Level: Oriented X4 Following Commands: Follows one step commands consistently Safety/Judgement: Decreased awareness of safety, Decreased awareness of deficits General Comments: pt appropriately emotional about worsening of deficits    Extremity Assessment (includes Sensation/Coordination)  Upper Extremity Assessment: RUE deficits/detail RUE Deficits / Details: pt with shoulder shrug but no other muscle activation noted with ROM  RUE Sensation: decreased light touch, decreased proprioception RUE  Coordination: decreased fine motor, decreased gross motor  Lower Extremity Assessment: Defer to PT evaluation RLE Deficits / Details: pt unable to df R ankle, moves into inversion when she attempts df. Hip flex 2-/5. R knee buckling in [place].  RLE Sensation: decreased proprioception RLE Coordination: decreased fine motor, decreased gross motor LLE Deficits / Details: WFL    ADLs  Overall ADL's : Needs assistance/impaired Eating/Feeding: Minimal assistance, Sitting Grooming: Minimal assistance, Sitting Upper Body Bathing: Moderate assistance, Sitting Lower Body Bathing: Maximal assistance, +2 for physical assistance, Sit to/from stand Upper Body Dressing : Moderate assistance, Sitting Lower Body  Dressing: Maximal assistance, +2 for physical assistance, Sit to/from stand Lower Body Dressing Details (indicate cue type and reason): pt able to utilize figure 4 and using L hand to don socks, requires assist/support to maintain LE in figure 4 position especially RLE Toilet Transfer: Maximal assistance, +2 for physical assistance, Ambulation Toilet Transfer Details (indicate cue type and reason): simulated via room level mobility, to/from recliner with seated rest in between Toileting- Clothing Manipulation and Hygiene: Maximal assistance, +2 for physical assistance, Sitting/lateral lean, Sit to/from stand Functional mobility during ADLs: Maximal assistance, +2 for physical assistance(HHA) General ADL Comments: pt with notable R side weakness, impaired cognition, decreased standing balacne and functional performance     Mobility  General bed mobility comments: pt received in chair    Transfers  Overall transfer level: Needs assistance Equipment used: 2 person hand held assist Transfers: Sit to/from Stand Sit to Stand: Mod assist, +2 physical assistance General transfer comment: mod A +2 for power up and R sided support due to buckling of R knee    Ambulation / Gait / Stairs / Wheelchair  Mobility  Ambulation/Gait Ambulation/Gait assistance: Max assist, +2 physical assistance Gait Distance (Feet): 14 Feet(6', 8') Assistive device: 2 person hand held assist Gait Pattern/deviations: Step-to pattern, Decreased weight shift to right, Decreased step length - right, Scissoring, Decreased dorsiflexion - right General Gait Details: scissoring RLE with foot strike in inversion and R knee buckling in stance.  Gait velocity: decreased Gait velocity interpretation: <1.31 ft/sec, indicative of household ambulator    Posture / Balance Balance Overall balance assessment: Needs assistance Sitting-balance support: No upper extremity supported, Feet supported Sitting balance-Leahy Scale: Fair Standing balance support: Bilateral upper extremity supported, During functional activity Standing balance-Leahy Scale: Poor Standing balance comment: reliant on external support    Special needs/care consideration BiPAP/CPAP  CPM  Continuous Drip IV  Dialysis         Days  Life Vest  Oxygen  Special Bed  Trach Size  Wound Vac (area)       Location  Skin                            Location  Bowel mgmt: no BM this admission Bladder mgmt: incontinent Diabetic mgmt: no Behavioral consideration  Chemo/radiation    Previous Home Environment (from acute therapy documentation) Living Arrangements: Spouse/significant other  Lives With: Spouse Available Help at Discharge: Family, Available 24 hours/day Type of Home: House Home Layout: One level Home Access: Stairs to enter [employer] of Steps: 1 Bathroom Shower/Tub: Public librarian, Charity fundraiser: Standard  Discharge Living Setting Type of Home at Discharge: Mobile home Discharge Home Layout: One level Discharge Home Access: Level entry Discharge Bathroom Shower/Tub: Tub/shower unit Discharge Bathroom Toilet: Standard Discharge Bathroom Accessibility: Yes How Accessible: Accessible via walker Does the patient have any  problems obtaining your medications?: Yes (Describe)(uninsured)  Social/Family/Support Systems Patient Roles: Spouse, Caregiver(babysits 61 year old grandson) Anticipated Caregiver: spouse and daughter Anticipated Ambulance person Information: Khala Tarte [phone]), Caroline More [phone]) Ability/Limitations of Caregiver: Mod A Caregiver Availability: 24/7 Discharge Plan Discussed with Primary Caregiver: Yes Is Caregiver In Agreement with Plan?: Yes Does Caregiver/Family have Issues with Lodging/Transportation while Pt is in Rehab?: No  Goals/Additional Needs Patient/Family Goal for Rehab: PT/OT/SLP supervision Expected length of stay: 3 weeks Dietary Needs: HH, thin liquids Pt/Family Agrees to Admission and willing to participate: Yes Program Orientation Provided & Reviewed with Pt/Caregiver Including Roles  &  Responsibilities: Yes  Barriers to Discharge: Insurance for SNF coverage  Decrease burden of Care through IP rehab admission: NA  Possible need for SNF placement upon discharge: NA  Patient Condition: I have reviewed medical records from [facility], spoken with CM, and patient, spouse and daughter. I met with patient at the bedside for inpatient rehabilitation assessment.  Patient will benefit from ongoing PT, OT and SLP, can actively participate in 3 hours of therapy a day 5 days of the week, and can make measurable gains during the admission.  Patient will also benefit from the coordinated team approach during an Inpatient Acute Rehabilitation admission.  The patient will receive intensive therapy as well as Rehabilitation physician, nursing, social worker, and care management interventions.  Due to safety, disease management, medication administration, pain management and patient education the patient requires 24 hour a day rehabilitation nursing.  The patient is currently max A with mobility and basic ADLs.  Discharge setting and therapy post discharge at home with  home health is anticipated.  Patient has agreed to participate in the Acute Inpatient Rehabilitation Program and will admit today.  Preadmission Screen Completed By:  Bethel Born, MS, CCC-SLP 10/11/2019 1:46 PM ______________________________________________________________________   Discussed status with Dr. Ranell Patrick on 10/11/19  at 2:03 PM  and received approval for admission today.  Admission Coordinator: Gayland Curry, MS, CCC-SLP , time 2:04 PM /Date 10/11/19    Assessment/Plan: Diagnosis: L MCA occlusion/infarct s/p tPA and thrombectomy 1. Does the need for close, 24 hr/day medical supervision in concert with the patient's rehab needs make it unreasonable for this patient to be served in a less intensive setting? Yes 2. Co-Morbidities requiring supervision/potential complications: migraine, dysphagia, aphasia; HTN 3. Due to bladder management, bowel management, safety, skin/wound care, disease management, medication administration and patient education, does the patient require 24 hr/day rehab nursing? Yes 4. Does the patient require coordinated care of a physician, rehab nurse, therapy disciplines of PT, OT and SLP to address physical and functional deficits in the context of the above medical diagnosis(es)? Yes Addressing deficits in the following areas: balance, endurance, locomotion, strength, transferring, bathing, dressing, feeding, grooming, toileting, cognition, speech, language, swallowing and psychosocial support 5. Can the patient actively participate in an intensive therapy program of at least 3 hrs of therapy per day at least 5 days per week? Yes 6. The potential for patient to make measurable gains while on inpatient rehab is excellent 7. Anticipated functional outcomes upon discharge from inpatient rehab are modified independent  with PT, modified independent with OT, supervision and min assist with SLP. 8. Estimated rehab length of stay to reach the above  functional goals is: 7-10 days 9. Anticipated discharge destination: Home 10. Overall Rehab/Functional Prognosis: excellent  MD Signature: Leeroy Cha, MD

## 2019-10-12 ENCOUNTER — Inpatient Hospital Stay (HOSPITAL_COMMUNITY): Payer: Self-pay | Admitting: Occupational Therapy

## 2019-10-12 ENCOUNTER — Inpatient Hospital Stay (HOSPITAL_COMMUNITY): Payer: Self-pay

## 2019-10-12 ENCOUNTER — Other Ambulatory Visit: Payer: Self-pay

## 2019-10-12 ENCOUNTER — Inpatient Hospital Stay (HOSPITAL_COMMUNITY): Payer: Self-pay | Admitting: Speech Pathology

## 2019-10-12 DIAGNOSIS — I63512 Cerebral infarction due to unspecified occlusion or stenosis of left middle cerebral artery: Secondary | ICD-10-CM

## 2019-10-12 DIAGNOSIS — I951 Orthostatic hypotension: Secondary | ICD-10-CM

## 2019-10-12 LAB — CBC WITH DIFFERENTIAL/PLATELET
Abs Immature Granulocytes: 0.1 10*3/uL — ABNORMAL HIGH (ref 0.00–0.07)
Basophils Absolute: 0.1 10*3/uL (ref 0.0–0.1)
Basophils Relative: 1 %
Eosinophils Absolute: 0.3 10*3/uL (ref 0.0–0.5)
Eosinophils Relative: 4 %
HCT: 33.8 % — ABNORMAL LOW (ref 36.0–46.0)
Hemoglobin: 11 g/dL — ABNORMAL LOW (ref 12.0–15.0)
Immature Granulocytes: 1 %
Lymphocytes Relative: 32 %
Lymphs Abs: 2.8 10*3/uL (ref 0.7–4.0)
MCH: 30.6 pg (ref 26.0–34.0)
MCHC: 32.5 g/dL (ref 30.0–36.0)
MCV: 93.9 fL (ref 80.0–100.0)
Monocytes Absolute: 0.7 10*3/uL (ref 0.1–1.0)
Monocytes Relative: 8 %
Neutro Abs: 4.7 10*3/uL (ref 1.7–7.7)
Neutrophils Relative %: 54 %
Platelets: 474 10*3/uL — ABNORMAL HIGH (ref 150–400)
RBC: 3.6 MIL/uL — ABNORMAL LOW (ref 3.87–5.11)
RDW: 12.6 % (ref 11.5–15.5)
WBC: 8.6 10*3/uL (ref 4.0–10.5)
nRBC: 0 % (ref 0.0–0.2)

## 2019-10-12 LAB — COMPREHENSIVE METABOLIC PANEL
ALT: 55 U/L — ABNORMAL HIGH (ref 0–44)
AST: 29 U/L (ref 15–41)
Albumin: 3.5 g/dL (ref 3.5–5.0)
Alkaline Phosphatase: 73 U/L (ref 38–126)
Anion gap: 10 (ref 5–15)
BUN: 19 mg/dL (ref 6–20)
CO2: 22 mmol/L (ref 22–32)
Calcium: 9 mg/dL (ref 8.9–10.3)
Chloride: 109 mmol/L (ref 98–111)
Creatinine, Ser: 0.83 mg/dL (ref 0.44–1.00)
GFR calc Af Amer: >60 mL/min (ref >60)
GFR calc non Af Amer: >60 mL/min (ref >60)
Glucose, Bld: 87 mg/dL (ref 70–99)
Potassium: 3.7 mmol/L (ref 3.5–5.1)
Sodium: 141 mmol/L (ref 135–145)
Total Bilirubin: 0.7 mg/dL (ref 0.3–1.2)
Total Protein: 6.3 g/dL — ABNORMAL LOW (ref 6.5–8.1)

## 2019-10-12 NOTE — Progress Notes (Signed)
Inpatient Rehabilitation Medication Review by a Pharmacist  A complete drug regimen review was completed for this patient to identify any potential clinically significant medication issues.  Clinically significant medication issues were identified:  no  Pharmacist comments: No issues noted  Time spent performing this drug regimen review (minutes):  5  Alvia Grove, PharmD PGY1 Acute Care Pharmacy Resident 10/12/2019 5:54 PM

## 2019-10-12 NOTE — Evaluation (Signed)
Speech Language Pathology Assessment and Plan  Patient Details  Name: Erin Peck MRN: [id number] Date of Birth: 1965/10/02  SLP Diagnosis: Dysarthria;Cognitive Impairments;Aphasia;Dysphagia  Rehab Potential: Good ELOS: 3 weeks    Today's Date: 10/12/2019 SLP Individual Time: 1240-1340 SLP Individual Time Calculation (min): 60 min   Problem List:  Patient Active Problem List   Diagnosis Date Noted  . 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 Aortic atherosclerosis ([place]) 10/10/2019  . CVA (cerebral vascular accident) ([place]) 10/09/2019  . Hypotension 10/06/2019  . Accelerated hypertension 10/06/2019  . Hyperlipidemia 10/06/2019  . 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 Hypoglycemia 10/06/2019  . Family hx-stroke 10/06/2019  . Complicated migraine 26/83/4196  . Acute blood loss anemia 10/06/2019  . Hypokalemia 10/06/2019  . Stroke due to occlusion of left middle cerebral artery ([place]) 10/03/2019  . Acute ischemic stroke ([place]) 10/02/2019   Past Medical History:  Past Medical History:  Diagnosis Date  . Migraines   . Stroke (cerebrum) [facility])    Past Surgical History:  Past Surgical History:  Procedure Laterality Date  . BUBBLE STUDY  10/06/2019   Procedure: BUBBLE STUDY;  Surgeon: Sueanne Margarita, MD;  Location: [place];  Service: Cardiovascular;;  . IR CT HEAD LTD  10/03/2019  . IR INTRA CRAN STENT  10/03/2019  . IR PATIENT EVAL TECH 0-60 MINS  10/03/2019  . IR PERCUTANEOUS ART THROMBECTOMY/INFUSION INTRACRANIAL INC DIAG ANGIO  10/03/2019  . RADIOLOGY WITH ANESTHESIA N/A 10/02/2019   Procedure: IR WITH ANESTHESIA;  Surgeon: Luanne Bras, MD;  Location: [place];  Service: Radiology;  Laterality: N/A;  . TEE WITHOUT CARDIOVERSION N/A 10/06/2019   Procedure: TRANSESOPHAGEAL ECHOCARDIOGRAM (TEE);  Surgeon: Sueanne Margarita, MD;  Location: [facility] ENDOSCOPY;  Service: Cardiovascular;  Laterality: N/A;    Assessment / Plan /  Recommendation Clinical Impression Patient is a 54 year old right-handed female with history of migraine headaches.  She lives with spouse independent prior to admission.  1 level home one-step to entry.  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  She was discharged to home after initially declining inpatient rehab services.  Presented 10/09/2019 with increased right side weakness.  CT/MRI showed acute infarction left MCA territory.  New area of acute infarction left superior temporal lobe compared to prior MRI of 10/03/2019.  Abnormal signal in the left MCA branches compatible with slow flow or occlusion.  CT angiogram of head and neck recent Y stenting of left M1 and proximal M2 vessels. No new site of embolic disease. Tolerating a regular diet.  Therapy evaluations completed and patient was admitted for a comprehensive rehab program 10/11/19.  Patient demonstrates mild oral weakness resulting in left anterior spillage of saliva and mild oral residue with solid textures that clears with a liquid wash. Patient consumed thin liquids via straw without overt s/s of aspiration. Recommend patient upgrade to regular textures and continue thin liquids with full supervision to maximize safety.  Patient demonstrates a mild-moderate dysarthria characterized by a low vocal intensity and imprecise consonants which impacts her overall speech intelligibility at the phrase level. Patient also demonstrates mild-moderate deficits in verbal expression with semantic paraphasias and decreased word-finding with Mod-Max A multimodal cues needed to self-monitor and correct. Mild-Moderate cognitive impairments were also noted in problem solving and attention to right field of environments/body.    Patient would benefit from skilled SLP intervention to  maximize her cognitive-linguistic, swallowing  and speech function prior to discharge.    Skilled Therapeutic Interventions          Administered a cognitive-linguistic evaluation and BSE, please see above for details.   SLP Assessment  Patient will need skilled Speech Lanaguage Pathology Services during CIR admission    Recommendations  SLP Diet Recommendations: Age appropriate regular solids Liquid Administration via: Cup;Straw Medication Administration: Whole meds with liquid Supervision: Patient able to self feed;Full supervision/cueing for compensatory strategies Compensations: Slow rate;Small sips/bites;Minimize environmental distractions Postural Changes and/or Swallow Maneuvers: Seated upright 90 degrees Oral Care Recommendations: Oral care BID Recommendations for Other Services: Neuropsych consult Patient destination: Home Follow up Recommendations: Outpatient SLP;24 hour supervision/assistance;Home Health SLP Equipment Recommended: None recommended by SLP    SLP Frequency 1 to 3 out of 7 days   SLP Duration  SLP Intensity  SLP Treatment/Interventions 3 weeks  Minumum of 1-2 x/day, 30 to 90 minutes  Cognitive remediation/compensation;Dysphagia/aspiration precaution training;Internal/external aids;Speech/Language facilitation;Therapeutic Activities;Environmental controls;Cueing hierarchy;Functional tasks;Patient/family education    Pain No/Denies Pain   Prior Functioning Type of Home: House  Lives With: Spouse Available Help at Discharge: Family;Available 24 hours/day  SLP Evaluation Cognition Overall Cognitive Status: Impaired/Different from baseline Arousal/Alertness: Awake/alert Orientation Level: Oriented X4 Attention: Focused;Sustained Focused Attention: Appears intact Sustained Attention: Appears intact Memory: Appears intact Memory Impairment: Retrieval deficit;Decreased short term memory;Decreased recall of new information Awareness: Impaired Awareness Impairment: Emergent impairment Problem  Solving: Impaired Problem Solving Impairment: Verbal basic;Functional basic Safety/Judgment: Impaired Comments: Patient is slightly impulsive, however, follows cues if given  Comprehension Auditory Comprehension Overall Auditory Comprehension: Impaired Yes/No Questions: Impaired Complex Questions: 75-100% accurate Commands: Within Functional Limits Conversation: Simple Interfering Components: Processing speed EffectiveTechniques: Extra processing time Visual Recognition/Discrimination Discrimination: Not tested Reading Comprehension Reading Status: Not tested Expression Expression Primary Mode of Expression: Verbal Verbal Expression Overall Verbal Expression: Impaired Initiation: No impairment Automatic Speech: Name;Social Response Level of Generative/Spontaneous Verbalization: Phrase Repetition: No impairment Naming: Impairment Responsive: 76-100% accurate Confrontation: Impaired Convergent: 75-100% accurate Divergent: 75-100% accurate Verbal Errors: Semantic paraphasias Non-Verbal Means of Communication: Not applicable Written Expression Dominant Hand: Right Written Expression: Not tested Oral Motor Oral Motor/Sensory Function Overall Oral Motor/Sensory Function: Mild impairment Facial ROM: Reduced right Facial Strength: Within Functional Limits Facial Sensation: Within Functional Limits Lingual ROM: Reduced right Lingual Symmetry: Abnormal symmetry right Lingual Strength: Reduced Lingual Sensation: Within Functional Limits Velum: Within Functional Limits Mandible: Within Functional Limits Motor Speech Overall Motor Speech: Impaired Respiration: Within functional limits Phonation: Low vocal intensity Resonance: Within functional limits Articulation: Impaired Level of Impairment: Phrase Intelligibility: Intelligibility reduced Word: 75-100% accurate Phrase: 75-100% accurate Sentence: 75-100% accurate Conversation: 75-100% accurate Motor Planning: Witnin  functional limits Effective Techniques: Increased vocal intensity;Over-articulate  Bedside Swallowing Assessment General Date of Onset: 10/09/19 Previous Swallow Assessment: See HPI  Diet Prior to this Study: Dysphagia 2 (chopped);Thin liquids Temperature Spikes Noted: No Respiratory Status: Room air History of Recent Intubation: No Length of Intubations (days): 2 days Date extubated: 10/04/19 Behavior/Cognition: Alert;Cooperative;Requires cueing Oral Cavity - Dentition: Adequate natural dentition Self-Feeding Abilities: Able to feed self;Needs set up Vision: Functional for self-feeding Patient Positioning: Upright in bed Baseline Vocal Quality: Low vocal intensity Volitional Cough: Weak Volitional Swallow: Able to elicit  Thin Liquid Thin Liquid: Within functional limits Presentation: Cup;Straw Nectar Thick Nectar Thick Liquid: Not tested Honey Thick Honey Thick Liquid: Not tested Puree Puree: Within functional limits Presentation: Self Fed Solid Solid: Impaired Presentation: Self Fed Oral Phase Functional Implications: Oral residue BSE  Assessment Risk for Aspiration Impact on safety and function: Mild aspiration risk Other Related Risk Factors: History of dysphagia  Short Term Goals: Week 1: SLP Short Term Goal 1 (Week 1): Patient will consume current diet with minimal overt s/s of aspiration and overall Mod I for use of swallowing compensatory strategies. SLP Short Term Goal 2 (Week 1): Patient will utilize speech intelligibility strategies at the phrase level to achieve 80% intelligibility with Mod verbal cues. SLP Short Term Goal 3 (Week 1): Patient will demonstrate functional problem solving for basic and famliar tasks with Mod verbal cues. SLP Short Term Goal 4 (Week 1): Patient will attend to right field of enviornment/body with Min verbal cues. SLP Short Term Goal 5 (Week 1): Patient will self-monitor and correct verbal errors at the phrase level with Min verbal  cues. SLP Short Term Goal 6 (Week 1): Patient will utilize word-finding strategies at the phrase level with mod verbal cues.  Refer to Care Plan for Long Term Goals  Recommendations for other services: Neuropsych  Discharge Criteria: Patient will be discharged from SLP if patient refuses treatment 3 consecutive times without medical reason, if treatment goals not met, if there is a change in medical status, if patient makes no progress towards goals or if patient is discharged from hospital.  The above assessment, treatment plan, treatment alternatives and goals were discussed and mutually agreed upon: by patient and by family  Baily Serpe 10/12/2019, 4:01 PM

## 2019-10-12 NOTE — Evaluation (Addendum)
Occupational Therapy Assessment and Plan  Patient Details  Name: Erin Peck MRN: [id number] Date of Birth: 1966/06/21  OT Diagnosis: cognitive deficits, flaccid hemiplegia and hemiparesis, hemiplegia affecting dominant side and muscle weakness (generalized) Rehab Potential: Rehab Potential (ACUTE ONLY): Excellent ELOS: 3 weeks   Today's Date: 10/12/2019 OT Individual Time: 0847-1000 OT Individual Time Calculation (min): 73 min     Problem List:  Patient Active Problem List   Diagnosis Date Noted  . 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 Aortic atherosclerosis ([place]) 10/10/2019  . CVA (cerebral vascular accident) ([place]) 10/09/2019  . Hypotension 10/06/2019  . Accelerated hypertension 10/06/2019  . Hyperlipidemia 10/06/2019  . 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 Hypoglycemia 10/06/2019  . Family hx-stroke 10/06/2019  . Complicated migraine 91/47/8295  . Acute blood loss anemia 10/06/2019  . Hypokalemia 10/06/2019  . Stroke due to occlusion of left middle cerebral artery ([place]) 10/03/2019  . Acute ischemic stroke ([place]) 10/02/2019    Past Medical History:  Past Medical History:  Diagnosis Date  . Migraines   . Stroke (cerebrum) [facility])    Past Surgical History:  Past Surgical History:  Procedure Laterality Date  . BUBBLE STUDY  10/06/2019   Procedure: BUBBLE STUDY;  Surgeon: Sueanne Margarita, MD;  Location: [place];  Service: Cardiovascular;;  . IR CT HEAD LTD  10/03/2019  . IR INTRA CRAN STENT  10/03/2019  . IR PATIENT EVAL TECH 0-60 MINS  10/03/2019  . IR PERCUTANEOUS ART THROMBECTOMY/INFUSION INTRACRANIAL INC DIAG ANGIO  10/03/2019  . RADIOLOGY WITH ANESTHESIA N/A 10/02/2019   Procedure: IR WITH ANESTHESIA;  Surgeon: Luanne Bras, MD;  Location: [place];  Service: Radiology;  Laterality: N/A;  . TEE WITHOUT CARDIOVERSION N/A 10/06/2019   Procedure: TRANSESOPHAGEAL ECHOCARDIOGRAM (TEE);  Surgeon: Sueanne Margarita, MD;  Location[facility]  ENDOSCOPY;  Service: Cardiovascular;  Laterality: N/A;    Assessment & Plan Clinical Impression: Patient is a 54 y.o. year old female with history of migraine headaches.  She lives with spouse independent prior to admission.  1 level home one-step to entry.  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  She was discharged to home after initially declining inpatient rehab services.  Presented 10/09/2019 with increased right side weakness.  CT/MRI showed acute infarction left MCA territory.  New area of acute infarction left superior temporal lobe compared to prior MRI of 10/03/2019.  Abnormal signal in the left MCA branches compatible with slow flow or occlusion.  CT angiogram of head and neck recent Y stenting of left M1 and proximal M2 vessels.. No new site of embolic disease. Patient transferred to CIR on 10/11/2019 .    Patient currently requires max with basic self-care skills secondary to muscle weakness, impaired timing and sequencing, abnormal tone and decreased coordination, decreased attention to left, decreased attention, decreased awareness, decreased problem solving and decreased memory and decreased sitting balance, decreased standing balance, decreased postural control, hemiplegia and decreased balance strategies.  Prior to hospitalization, patient could complete BADL with independent .  Patient will benefit from skilled intervention to increase independence with basic self-care skills prior to discharge home with care partner.  Anticipate patient will require 24 hour supervision and follow up home health.  OT - End of Session Endurance Deficit: Yes Endurance Deficit Description: Rest breaks within BADL tasks OT Assessment Rehab Potential (ACUTE ONLY): Excellent OT Patient  demonstrates impairments in the following area(s):  Balance;Cognition;Endurance;Motor;Pain;Perception;Safety;Sensory OT Basic ADL's Functional Problem(s): Eating;Grooming;Bathing;Dressing;Toileting OT Transfers Functional Problem(s): Toilet;Tub/Shower OT Additional Impairment(s): Fuctional Use of Upper Extremity OT Plan OT Intensity: Minimum of 1-2 x/day, 45 to 90 minutes OT Frequency: 5 out of 7 days OT Duration/Estimated Length of Stay: 3 weeks OT Treatment/Interventions: Balance/vestibular training;Cognitive remediation/compensation;Community reintegration;Discharge planning;DME/adaptive equipment instruction;Functional electrical stimulation;Functional mobility training;Neuromuscular re-education;Pain management;Patient/family education;Self Care/advanced ADL retraining;Splinting/orthotics;Therapeutic Activities;Therapeutic Exercise;UE/LE Strength taining/ROM;UE/LE Coordination activities;Wheelchair propulsion/positioning OT Self Feeding Anticipated Outcome(s): Ste-up A OT Basic Self-Care Anticipated Outcome(s): Supervision OT Toileting Anticipated Outcome(s): Supervision OT Bathroom Transfers Anticipated Outcome(s): Supervision OT Recommendation Patient destination: Home Follow Up Recommendations: Home health OT Equipment Recommended: To be determined Equipment Details: Likely a tub transfer bench and 3-in-1 BSC   Skilled Therapeutic Intervention Initial eval completed with treatment provided to address functional transfers, functional use of R UE, improved sit<>stand, standing tolerance, and adapted bathing/dressing skills. Pt came to sitting EOB with mod A. Stand-pivot transfers bed>wc>shower chair with overall Mod A and R knee block. Incorporated R NMR weight bearing techniques within bathing tasks with hand over hand A to grasp wash cloth. Min/mod A for sitting balance when leaning to wash buttocks. LB/UB dressing completed wc level at the sink with assistance to thread R LE into clothing and assist to pull up pants. UB dressing with mod  A overall 2/2 R hemiplegia. Incorporate weight bearing through R UE when standing at the sink. Pt left seated in wc at end of session with safety belt on, call bell in reach, and needs met.  OT Evaluation Precautions/Restrictions  Precautions Precautions: Fall Precaution Comments: R hemiplegia Restrictions Weight Bearing Restrictions: No Pain Pain Assessment Pain Scale: 0-10 Pain Score: 0-No pain Home Living/Prior Functioning Home Living Family/patient expects to be discharged to:: Private residence Living Arrangements: Spouse/significant other Available Help at Discharge: Family, Available 24 hours/day Type of Home: House Home Access: Stairs to enter [employer] of Steps: 1 Home Layout: One level Bathroom Shower/Tub: Public librarian, Charity fundraiser: Standard  Lives With: Spouse IADL History Current License: Yes Prior Function Level of Independence: Independent with basic ADLs, Independent with homemaking with ambulation  Able to Take Stairs?: Yes Driving: Yes Comments: Independnet PTA, was keeping 3 year old Grandson M-F.  ADL ADL Eating: Minimal assistance Grooming: Minimal assistance Upper Body Bathing: Moderate assistance Lower Body Bathing: Maximal assistance Upper Body Dressing: Moderate assistance Lower Body Dressing: Maximal assistance Toileting: Maximal assistance Toilet Transfer: Moderate assistance Tub/Shower Transfer: Moderate assistance Vision Patient Visual Report: No change from baseline Vision Assessment?: No apparent visual deficits Perception  Perception: Impaired(Mild R innattention) Cognition Overall Cognitive Status: Impaired/Different from baseline Arousal/Alertness: Awake/alert Orientation Level: Person;Place;Situation Person: Oriented Place: Oriented Situation: Oriented Year: 2021 Month: March Day of Week: Correct Memory: Impaired Memory Impairment: Storage deficit;Retrieval deficit;Decreased recall of new  information Immediate Memory Recall: Blue;Bed;Sock Memory Recall Sock: Not able to recall Memory Recall Blue: With Cue Memory Recall Bed: With Cue Awareness: Impaired Problem Solving: Impaired Sensation Sensation Light Touch: Impaired Detail Light Touch Impaired Details: Impaired RUE;Impaired RLE Coordination Gross Motor Movements are Fluid and Coordinated: No Fine Motor Movements are Fluid and Coordinated: No Coordination and Movement Description: R hemiplegia Motor  Motor Motor: Hemiplegia Mobility  Bed Mobility Bed Mobility: Supine to Sit Supine to Sit: Moderate Assistance - Patient 50-74% Transfers Sit to Stand: Moderate Assistance - Patient 50-74% Stand to Sit: Moderate Assistance - Patient 50-74%  Balance Balance Balance Assessed: Yes Static Sitting Balance Static Sitting - Level of  Assistance: 4: Min assist Dynamic Sitting Balance Dynamic Sitting - Balance Support: During functional activity Dynamic Sitting - Level of Assistance: 4: Min Insurance risk surveyor Standing - Balance Support: During functional activity Static Standing - Level of Assistance: 3: Mod assist Dynamic Standing Balance Dynamic Standing - Balance Support: During functional activity Dynamic Standing - Level of Assistance: 2: Max assist Extremity/Trunk Assessment RUE Assessment RUE Assessment: Exceptions to [facility] Passive Range of Motion (PROM) Comments: prom wfl RUE Body System: Neuro Brunstrum levels for arm and hand: Hand;Arm Brunstrum level for arm: Stage I Presynergy Brunstrum level for hand: Stage I Flaccidity LUE Assessment LUE Assessment: Within Functional Limits     Refer to Care Plan for Long Term Goals  Recommendations for other services: None    Discharge Criteria: Patient will be discharged from OT if patient refuses treatment 3 consecutive times without medical reason, if treatment goals not met, if there is a change in medical status, if patient makes no  progress towards goals or if patient is discharged from hospital.  The above assessment, treatment plan, treatment alternatives and goals were discussed and mutually agreed upon: by patient  Valma Cava 10/12/2019, 3:13 PM

## 2019-10-12 NOTE — Evaluation (Addendum)
Physical Therapy Assessment and Plan  Patient Details  Name: Erin Peck MRN: [id number] Date of Birth: Oct 07, 1965  PT Diagnosis: Abnormal posture, Abnormality of gait, Cognitive deficits, Coordination disorder, Difficulty walking, Dizziness and giddiness, Edema, Hemiparesis dominant, Hemiplegia dominant, Hypotonia, Impaired cognition and Impaired sensation Rehab Potential: Good ELOS: 2-3 weeks   Today's Date: 10/12/2019 PT Individual Time: 1601-0932 PT Individual Time Calculation (min): 65 min    Problem List:  Patient Active Problem List   Diagnosis Date Noted  . 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 Aortic atherosclerosis ([place]) 10/10/2019  . CVA (cerebral vascular accident) ([place]) 10/09/2019  . Hypotension 10/06/2019  . Accelerated hypertension 10/06/2019  . Hyperlipidemia 10/06/2019  . 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 Hypoglycemia 10/06/2019  . Family hx-stroke 10/06/2019  . Complicated migraine 35/57/3220  . Acute blood loss anemia 10/06/2019  . Hypokalemia 10/06/2019  . Stroke due to occlusion of left middle cerebral artery ([place]) 10/03/2019  . Acute ischemic stroke ([place]) 10/02/2019    Past Medical History:  Past Medical History:  Diagnosis Date  . Migraines   . Stroke (cerebrum) [facility])    Past Surgical History:  Past Surgical History:  Procedure Laterality Date  . BUBBLE STUDY  10/06/2019   Procedure: BUBBLE STUDY;  Surgeon: Sueanne Margarita, MD;  Location: [facility];  Service: Cardiovascular;;  . IR CT HEAD LTD  10/03/2019  . IR INTRA CRAN STENT  10/03/2019  . IR PATIENT EVAL TECH 0-60 MINS  10/03/2019  . IR PERCUTANEOUS ART THROMBECTOMY/INFUSION INTRACRANIAL INC DIAG ANGIO  10/03/2019  . RADIOLOGY WITH ANESTHESIA N/A 10/02/2019   Procedure: IR WITH ANESTHESIA;  Surgeon: Luanne Bras, MD;  Location: [place];  Service: Radiology;  Laterality: N/A;  . TEE WITHOUT CARDIOVERSION N/A 10/06/2019   Procedure: TRANSESOPHAGEAL  ECHOCARDIOGRAM (TEE);  Surgeon: Sueanne Margarita, MD;  Location: [facility] ENDOSCOPY;  Service: Cardiovascular;  Laterality: N/A;    Assessment & Plan Clinical Impression: Patient is a 54 y.o. year old right-handed female with history of migraine headaches.  She lives with spouse independent prior to admission.  1 level home one-step to entry.  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  She was discharged to home after initially declining inpatient rehab services.  Presented 10/09/2019 with increased right side weakness.  CT/MRI showed acute infarction left MCA territory.  New area of acute infarction left superior temporal lobe compared to prior MRI of 10/03/2019.  Abnormal signal in the left MCA branches compatible with slow flow or occlusion.  CT angiogram of head and neck recent Y stenting of left M1 and proximal M2 vessels.. No new site of embolic disease.  Most recent echocardiogram with ejection fraction of 65%.  Neurology consulted currently maintained on aspirin 325 mg daily as well as the addition of Brilinta.  Subcutaneous Lovenox for DVT prophylaxis.  Close monitoring of blood pressure and maintain on ProAmatine.  Tolerating a regular diet.  Therapy evaluations completed and patient was admitted for a comprehensive rehab program  Patient transferred to CIR on 10/11/2019 .   Patient currently requires mod with mobility secondary to muscle weakness, decreased cardiorespiratoy endurance, impaired timing and sequencing, abnormal tone, unbalanced muscle activation and decreased coordination, decreased attention to right, decreased safety awareness and decreased memory and decreased sitting balance, decreased standing balance, decreased postural control, hemiplegia and decreased balance strategies.  Prior to hospitalization,  patient was independent  with mobility and lived with  Spouse in a House home.  Home access is 1Stairs to enter.  Patient will benefit from skilled PT intervention to maximize safe functional mobility, minimize fall risk and decrease caregiver burden for planned discharge home with intermittent assist.  Anticipate patient will benefit from follow up OP at discharge.  PT - End of Session Activity Tolerance: Tolerates 30+ min activity with multiple rests Endurance Deficit: Yes Endurance Deficit Description: required multiple rest breaks during evaluation PT Assessment Rehab Potential (ACUTE/IP ONLY): Good PT Barriers to Discharge: Home environment access/layout PT Barriers to Discharge Comments: 1 STE home without rails PT Patient demonstrates impairments in the following area(s): Balance;Behavior;Edema;Endurance;Motor;Nutrition;Pain;Perception;Safety;Sensory;Skin Integrity PT Transfers Functional Problem(s): Bed Mobility;Bed to Chair;Car;Furniture PT Locomotion Functional Problem(s): Ambulation;Wheelchair Mobility;Stairs PT Plan PT Intensity: Minimum of 1-2 x/day ,45 to 90 minutes PT Frequency: 5 out of 7 days PT Duration Estimated Length of Stay: 2-3 weeks PT Treatment/Interventions: Ambulation/gait training;Community reintegration;DME/adaptive equipment instruction;Neuromuscular re-education;Psychosocial support;Stair training;UE/LE Strength taining/ROM;Wheelchair propulsion/positioning;Balance/vestibular training;Discharge planning;Functional electrical stimulation;Pain management;Skin care/wound management;Therapeutic Activities;UE/LE Coordination activities;Cognitive remediation/compensation;Disease management/prevention;Functional mobility training;Patient/family education;Splinting/orthotics;Therapeutic Exercise;Visual/perceptual remediation/compensation PT Transfers Anticipated Outcome(s): mod I using LRAD PT Locomotion Anticipated Outcome(s): Supervision >150 ft using LRAD PT Recommendation Recommendations for Other Services: Neuropsych  consult;Therapeutic Recreation consult Therapeutic Recreation Interventions: Stress management Follow Up Recommendations: Outpatient PT Patient destination: Home Equipment Recommended: To be determined Equipment Details: Patient has a RW, will determine further DME needs based on patient progress  Skilled Therapeutic Intervention  In addition to the PT evaluation below, the patient performed the following skilled PT interventions: Patient in w/c with her husband and daughter in the room upon PT arrival. Patient alert and agreeable to PT session. Patient denied pain during session.  Therapeutic Activity: Bed Mobility: Patient performed rolling R/L and supine to/from sit with min A for trunk and R UE/LE managment on a mat table.  Transfers: Patient performed sit to/from stand and stand pivot transfers with min A with and without a RW with PT blocking R knee without RW to prevent buckling. Provided verbal cues for using of RW and sequencing with RW. Provided manual assistance for weight shift to R during pivoting.  Patient performed a simulated sedan height car transfer with mod A without an AD.   Gait Training:  Patient ambulated 8 feet without an AD with mod A and 18 feet using a RW with R hand splint with mod-min A with PT blocking R knee in stance to prevent bucking. Ambulated as described below. Provided verbal cues for R limb advancement and knee control during second trial. Noted increased R foot inversion in swing and NBS with fatigue.  Patient ascended/descended 4 steps using L rail with mod A for balance, weight shifting, R LE advancement/placement, and blocking R LE in stance. Performed step-to gait pattern leading with R LE while ascending and descending without cues.   Wheelchair Mobility:  Patient propelled wheelchair 4 feet with supervision, however, patient unable to steer due to R hemi and required total a to propel 150 ft.   Patient in w/c with her husband and daughter in the  room at end of session with breaks locked, seat belt alarm set, and all needs within reach. Educated patient and her family on patient's performance, all seemed very pleased and are looking forward to her recovery in CIR.  Instructed pt and her family in results of PT evaluation as detailed below, PT POC, rehab potential, rehab goals, and discharge recommendations. Additionally discussed CIR's  policies regarding fall safety and use of chair alarm and/or quick release belt. They verbalized understanding and in agreement.   Vitals:  Sitting: BP 129/105 HR 77 (asymptomatic) Standing: BP 117/65 HR 66 (mild light headedness reported) Standing x3 min: BP 119/61 HR 60 (asymptomatic)  PT Evaluation Precautions/Restrictions Precautions Precautions: Fall Precaution Comments: R hemiplegia Restrictions Weight Bearing Restrictions: No Home Living/Prior Functioning Home Living Available Help at Discharge: Family;Available 24 hours/day Type of Home: House Home Access: Stairs to enter [employer] of Steps: 1 Entrance Stairs-Rails: None Home Layout: One level Bathroom Shower/Tub: Tub/shower unit;Door [employer] Toilet: Standard  Lives With: Spouse Prior Function Level of Independence: Independent with basic ADLs;Independent with gait;Independent with homemaking with ambulation;Independent with transfers  Able to Take Stairs?: Yes Driving: Yes Comments: independent prior to first CVA last week. Was keeping her grandkids. Was ambulatory when she went home last week and had use of RUE Vision/Perception  Perception Perception: Impaired Inattention/Neglect: Does not attend to right side of body(mild impairment) Praxis Praxis: Intact  Cognition Overall Cognitive Status: Impaired/Different from baseline Arousal/Alertness: Awake/alert Attention: Focused;Sustained Focused Attention: Appears intact Sustained Attention: Appears intact Memory: Impaired(unable to recall what tasks were  performed during session, looks to family for answers about home set up) Memory Impairment: Retrieval deficit;Decreased short term memory;Decreased recall of new information Awareness: Impaired Problem Solving: Impaired Safety/Judgment: Impaired Comments: Patient is slightly impulsive, however, follows cues if given Sensation Sensation Light Touch: Impaired Detail Light Touch Impaired Details: Impaired RUE;Impaired RLE Coordination Gross Motor Movements are Fluid and Coordinated: No Fine Motor Movements are Fluid and Coordinated: No Coordination and Movement Description: R hemiplegia Motor  Motor Motor: Hemiplegia Motor - Skilled Clinical Observations: R hemi  Mobility Bed Mobility Bed Mobility: Rolling Right;Rolling Left;Supine to Sit;Sit to Supine Rolling Right: Minimal Assistance - Patient > 75% Rolling Left: Minimal Assistance - Patient > 75% Supine to Sit: Minimal Assistance - Patient > 75% Sit to Supine: Minimal Assistance - Patient > 75% Transfers Transfers: Sit to Stand;Stand to Sit;Stand Pivot Transfers Sit to Stand: Minimal Assistance - Patient > 75% Stand to Sit: Minimal Assistance - Patient > 75% Stand Pivot Transfers: Minimal Assistance - Patient > 75% Transfer (Assistive device): None Locomotion  Gait Ambulation: Yes Gait Assistance: Moderate Assistance - Patient 50-74% Gait Distance (Feet): 8 Feet Assistive device: None Gait Assistance Details: Manual facilitation for weight shifting Gait Assistance Details: blocked R knee in stance to prevent buckling Gait Gait: Yes Gait Pattern: Step-to pattern;Decreased step length - left;Decreased stance time - right;Decreased hip/knee flexion - right;Decreased dorsiflexion - right;Decreased weight shift to right;Right hip hike;Right flexed knee in stance;Decreased trunk rotation;Trunk rotated posteriorly on right;Narrow base of support;Poor foot clearance - right Gait velocity: decreased Stairs / Additional  Locomotion Stairs: Yes Stairs Assistance: Moderate Assistance - Patient 50 - 74% Stair Management Technique: One rail Left Number of Stairs: 4 Height of Stairs: 6 Wheelchair Mobility Wheelchair Mobility: Yes Wheelchair Assistance: Total Assistance - Patient <25% Wheelchair Propulsion: Left upper extremity Wheelchair Parts Management: Needs assistance Distance: 150 ft  Trunk/Postural Assessment  Cervical Assessment Cervical Assessment: Within Functional Limits Thoracic Assessment Thoracic Assessment: Within Functional Limits Lumbar Assessment Lumbar Assessment: Exceptions to WFL(posterior pelvic tilt) Postural Control Postural Control: Deficits on evaluation(decreased/delayed)  Balance Balance Balance Assessed: Yes Static Sitting Balance Static Sitting - Level of Assistance: 5: Stand by assistance Dynamic Sitting Balance Dynamic Sitting - Balance Support: During functional activity Dynamic Sitting - Level of Assistance: 4: Min Insurance risk surveyor Standing - Balance Support:  During functional activity Static Standing - Level of Assistance: 4: Min assist Dynamic Standing Balance Dynamic Standing - Balance Support: During functional activity Dynamic Standing - Level of Assistance: 3: Mod assist Extremity Assessment  RUE Assessment RUE Assessment: Exceptions to [facility] Passive Range of Motion (PROM) Comments: prom wfl RUE Body System: Neuro Brunstrum levels for arm and hand: Hand;Arm Brunstrum level for arm: Stage I Presynergy Brunstrum level for hand: Stage I Flaccidity LUE Assessment LUE Assessment: Within Functional Limits RLE Assessment RLE Assessment: Exceptions to [facility] Active Range of Motion (AROM) Comments: WFL for all functionla mobilty General Strength Comments: Tested in sitting RLE Strength Right Hip Flexion: 2-/5 Right Knee Flexion: 3+/5 Right Knee Extension: 3+/5 Right Ankle Dorsiflexion: 1/5 Right Ankle Plantar Flexion: 3+/5 RLE Tone RLE  Tone: Mild LLE Assessment LLE Assessment: Within Functional Limits Active Range of Motion (AROM) Comments: WLF for all functional mobility General Strength Comments: Grossly in sitting 5/5 throughout    Refer to Care Plan for Long Term Goals  Recommendations for other services: Neuropsych and Therapeutic Recreation  Stress management  Discharge Criteria: Patient will be discharged from PT if patient refuses treatment 3 consecutive times without medical reason, if treatment goals not met, if there is a change in medical status, if patient makes no progress towards goals or if patient is discharged from hospital.  The above assessment, treatment plan, treatment alternatives and goals were discussed and mutually agreed upon: by patient and by family  Doreene Burke PT, DPT  10/12/2019, 12:43 PM

## 2019-10-12 NOTE — Discharge Instructions (Signed)
Inpatient Rehab Discharge Instructions  Erin Peck Discharge date and time: No discharge date for patient encounter.   Activities/Precautions/ Functional Status: Activity: activity as tolerated Diet:  Wound Care: none needed Functional status:  ___ No restrictions     ___ Walk up steps independently ___ 24/7 supervision/assistance   ___ Walk up steps with assistance ___ Intermittent supervision/assistance  ___ Bathe/dress independently ___ Walk with walker     _x__ Bathe/dress with assistance ___ Walk Independently    ___ Shower independently ___ Walk with assistance    ___ Shower with assistance ___ No alcohol     ___ Return to work/school ________  Special Instructions: No driving smoking or alcohol STROKE/TIA DISCHARGE INSTRUCTIONS SMOKING Cigarette smoking nearly doubles your risk of having a stroke & is the single most alterable risk factor  If you smoke or have smoked in the last 12 months, you are advised to quit smoking for your health.  Most of the excess cardiovascular risk related to smoking disappears within a year of stopping.  Ask you doctor about anti-smoking medications  [place] Quit Line: 1-800-QUIT NOW  Free Smoking Cessation Classes [phone]  CHOLESTEROL Know your levels; limit fat & cholesterol in your diet  Lipid Panel     Component Value Date/Time   CHOL 236 (H) 10/03/2019 0212   TRIG 187 (H) 10/03/2019 0555   HDL 49 10/03/2019 0212   CHOLHDL 4.8 10/03/2019 0212   VLDL 35 10/03/2019 0212   LDLCALC 152 (H) 10/03/2019 0212      Many patients benefit from treatment even if their cholesterol is at goal.  Goal: Total Cholesterol (CHOL) less than 160  Goal:  Triglycerides (TRIG) less than 150  Goal:  HDL greater than 40  Goal:  LDL (LDLCALC) less than 100   BLOOD PRESSURE American Stroke Association blood pressure target is less that 120/80 mm/Hg  Your discharge blood pressure is:  BP: 112/60  Monitor your blood pressure  Limit your salt  and alcohol intake  Many individuals will require more than one medication for high blood pressure  DIABETES (A1c is a blood sugar average for last 3 months) Goal HGBA1c is under 7% (HBGA1c is blood sugar average for last 3 months)  Diabetes: No known diagnosis of diabetes    Lab Results  Component Value Date   HGBA1C 5.3 10/03/2019     Your HGBA1c can be lowered with medications, healthy diet, and exercise.  Check your blood sugar as directed by your physician  Call your physician if you experience unexplained or low blood sugars.  PHYSICAL ACTIVITY/REHABILITATION Goal is 30 minutes at least 4 days per week  Activity: Increase activity slowly, Therapies: Physical Therapy: Home Health Return to work:   Activity decreases your risk of heart attack and stroke and makes your heart stronger.  It helps control your weight and blood pressure; helps you relax and can improve your mood.  Participate in a regular exercise program.  Talk with your doctor about the best form of exercise for you (dancing, walking, swimming, cycling).  DIET/WEIGHT Goal is to maintain a healthy weight  Your discharge diet is:  Diet Order            DIET DYS 2 Room service appropriate? Yes; Fluid consistency: Thin  Diet effective now              liquids Your height is:    Your current weight is:   Your Body Mass Index (BMI) is:  Following the type of diet specifically designed for you will help prevent another stroke.  Your goal weight range is:    Your goal Body Mass Index (BMI) is 19-24.  Healthy food habits can help reduce 3 risk factors for stroke:  High cholesterol, hypertension, and excess weight.  RESOURCES Stroke/Support Group:  Call [phone]   STROKE EDUCATION PROVIDED/REVIEWED AND GIVEN TO PATIENT Stroke warning signs and symptoms How to activate emergency medical system (call 911). Medications prescribed at discharge. Need for follow-up after discharge. Personal risk factors  for stroke. Pneumonia vaccine given:  Flu vaccine given:  My questions have been answered, the writing is legible, and I understand these instructions.  I will adhere to these goals & educational materials that have been provided to me after my discharge from the hospital.      My questions have been answered and I understand these instructions. I will adhere to these goals and the provided educational materials after my discharge from the hospital.  Patient/Caregiver Signature _______________________________ Date __________  Clinician Signature _______________________________________ Date __________  Please bring this form and your medication list with you to all your follow-up doctor's appointments.

## 2019-10-12 NOTE — Progress Notes (Signed)
Pt arrived on unit via wheelchair. Pt alert and oriented. No c/o pain. Pt made aware of safety plan on floor. Call bell w/in reach of pt.

## 2019-10-12 NOTE — Progress Notes (Signed)
Inpatient Rehabilitation  Patient information reviewed and entered into eRehab system by Mehr Depaoli M. Karim Aiello, M.A., CCC/SLP, PPS Coordinator.  Information including medical coding, functional ability and quality indicators will be reviewed and updated through discharge.    

## 2019-10-12 NOTE — Progress Notes (Signed)
[facility] PHYSICAL MEDICINE & REHABILITATION PROGRESS NOTE   Subjective/Complaints: Had a reasonable night. Able to sleep. Denies pain.   ROS: Patient denies fever, rash, sore throat, blurred vision, nausea, vomiting, diarrhea, cough, shortness of breath or chest pain, joint or back pain, headache, or mood change.    Objective:   No results found. Recent Labs    10/11/19 2208 10/12/19 0537  WBC 10.3 8.6  HGB 10.9* 11.0*  HCT 32.9* 33.8*  PLT 444* 474*   Recent Labs    10/11/19 2208 10/12/19 0537  NA 143 141  K 3.9 3.7  CL 111 109  CO2 21* 22  GLUCOSE 103* 87  BUN 20 19  CREATININE 0.73 0.83  CALCIUM 9.2 9.0   No intake or output data in the 24 hours ending 10/12/19 1030   Physical Exam: Vital Signs Blood pressure 112/60, pulse 68, temperature 98.1 F (36.7 C), temperature source Oral, resp. rate 20, SpO2 95 %. Constitutional: No distress . Vital signs reviewed. HEENT: EOMI, oral membranes moist Neck: supple Cardiovascular: RRR without murmur. No JVD    Respiratory/Chest: CTA Bilaterally without wheezes or rales. Normal effort    GI/Abdomen: BS +, non-tender, sl-distended Ext: no clubbing, cyanosis, or edema Musculoskeletal:  Cervical back: Normal range of motionand neck supple.  Neuro: RUE tr deltoid, 1/5 pec/biceps, 0/5 otherwise.   LUE:   5/5.   RLE: HF, KE, KF, DF and PF 3/5  LLE 5/5   Decreased LT LUE>LLE, normal LT RUE/RLE Normal tone. Word finding deficits, speech not fluent. Able to ID 2/2 basic objects.  Psychiatric:  Calm, flat but overall very pleasant    Assessment/Plan: 1. Functional deficits secondary to left MCA infarct which require 3+ hours per day of interdisciplinary therapy in a comprehensive inpatient rehab setting.  Physiatrist is providing close team supervision and 24 hour management of active medical problems listed below.  Physiatrist and rehab team continue to assess barriers to discharge/monitor patient progress toward  functional and medical goals  Care Tool:  Bathing    Body parts bathed by patient: Right arm, Chest, Right upper leg, Left upper leg, Front perineal area, Abdomen, Face   Body parts bathed by helper: Left arm, Right lower leg, Left lower leg, Buttocks     Bathing assist Assist Level: Moderate Assistance - Patient 50 - 74%     Upper Body Dressing/Undressing Upper body dressing   What is the patient wearing?: Pull over shirt    Upper body assist Assist Level: Moderate Assistance - Patient 50 - 74%    Lower Body Dressing/Undressing Lower body dressing      What is the patient wearing?: Incontinence brief, Pants     Lower body assist Assist for lower body dressing: Maximal Assistance - Patient 25 - 49%     Toileting Toileting    Toileting assist Assist for toileting: Maximal Assistance - Patient 25 - 49%     Transfers Chair/bed transfer  Transfers assist     Chair/bed transfer assist level: Moderate Assistance - Patient 50 - 74%     Locomotion Ambulation   Ambulation assist              Walk 10 feet activity   Assist           Walk 50 feet activity   Assist           Walk 150 feet activity   Assist           Walk 10 feet  on uneven surface  activity   Assist           Wheelchair     Assist               Wheelchair 50 feet with 2 turns activity    Assist            Wheelchair 150 feet activity     Assist          Blood pressure 112/60, pulse 68, temperature 98.1 F (36.7 C), temperature source Oral, resp. rate 20, SpO2 95 %.  Medical Problem List and Plan: 1.  Right side weakness with expressive aphasia secondary to acute left temporal lobe infarction d/t new in-stent thrombosis with recent left MCA stent             -patient may shower             -ELOS/Goals: 10-14 days  --Patient is beginning CIR therapies today including PT, OT, and SLP  2.  Antithrombotics: -DVT/anticoagulation:  Lovenox 40 mg daily             -antiplatelet therapy: Aspirin 325 mg daily, Brilinta 90 mg twice daily 3. Pain Management: Tylenol as needed 4. Mood: Amantadine 100 mg daily             -antipsychotic agents: N/A             -monitor for depression 5. Neuropsych: This patient is capable of making decisions on her own behalf. 6. Skin/Wound Care: Routine skin checks 7. Fluids/Electrolytes/Nutrition: encourage PO  -I personally reviewed the patient's labs today.   8.  Orthostasis.  ProAmatine 10 mg 3 times daily.  Monitor with increased activities with therapy 9.  Hyperlipidemia.  Lipitor 10. CV: Has follow-up with EP cardiology on 11/09/19 to discuss loop recorder placement.     LOS: 1 days A FACE TO FACE EVALUATION WAS PERFORMED  Ranelle Oyster 10/12/2019, 10:30 AM

## 2019-10-13 ENCOUNTER — Inpatient Hospital Stay (HOSPITAL_COMMUNITY): Payer: Self-pay | Admitting: Occupational Therapy

## 2019-10-13 ENCOUNTER — Inpatient Hospital Stay (HOSPITAL_COMMUNITY): Payer: Self-pay | Admitting: Physical Therapy

## 2019-10-13 ENCOUNTER — Inpatient Hospital Stay (HOSPITAL_COMMUNITY): Payer: Self-pay | Admitting: Speech Pathology

## 2019-10-13 MED ORDER — PANTOPRAZOLE SODIUM 40 MG PO TBEC
40.0000 mg | DELAYED_RELEASE_TABLET | Freq: Every day | ORAL | Status: DC
  Administered 2019-10-13 – 2019-10-17 (×5): 40 mg via ORAL
  Filled 2019-10-13 (×5): qty 1

## 2019-10-13 MED ORDER — MIDODRINE HCL 5 MG PO TABS
5.0000 mg | ORAL_TABLET | Freq: Three times a day (TID) | ORAL | Status: DC
  Administered 2019-10-13 – 2019-10-15 (×8): 5 mg via ORAL
  Filled 2019-10-13 (×6): qty 1

## 2019-10-13 NOTE — Progress Notes (Signed)
[place] PHYSICAL MEDICINE & REHABILITATION PROGRESS NOTE   Subjective/Complaints: No problems overnight. Tolerated therapies without issues yesterday. Wearing e-stim unit right wrist flexors when I entered  ROS: Patient denies fever, rash, sore throat, blurred vision, nausea, vomiting, diarrhea, cough, shortness of breath or chest pain, joint or back pain, headache, or mood change.   Objective:   No results found. Recent Labs    10/11/19 2208 10/12/19 0537  WBC 10.3 8.6  HGB 10.9* 11.0*  HCT 32.9* 33.8*  PLT 444* 474*   Recent Labs    10/11/19 2208 10/12/19 0537  NA 143 141  K 3.9 3.7  CL 111 109  CO2 21* 22  GLUCOSE 103* 87  BUN 20 19  CREATININE 0.73 0.83  CALCIUM 9.2 9.0    Intake/Output Summary (Last 24 hours) at 10/13/2019 1234 Last data filed at 10/13/2019 6237 Gross per 24 hour  Intake 684 ml  Output --  Net 684 ml     Physical Exam: Vital Signs Blood pressure 129/64, pulse 63, temperature 98.3 F (36.8 C), resp. rate 20, height 5\' 5"  (1.651 m), SpO2 100 %. Constitutional: No distress . Vital signs reviewed. HEENT: EOMI, oral membranes moist Neck: supple Cardiovascular: RRR without murmur. No JVD    Respiratory/Chest: CTA Bilaterally without wheezes or rales. Normal effort    GI/Abdomen: BS +, non-tender, non-distended Ext: no clubbing, cyanosis, or edema Musculoskeletal:  Cervical back: Normal range of motionand neck supple.  Neuro: RUE tr deltoid, 1/5 pec/biceps, 0/5 otherwise.   LUE:   5/5.   RLE: HF, KE, KF, DF and PF 2-3/5  LLE 5/5   Decreased LT,pain LUE>LLE, normal LT RUE/RLE Word finding deficits/non-fluent. Reasonable comprehension. Follows simple commands with extra time.  Psychiatric:  Very pleasant    Assessment/Plan: 1. Functional deficits secondary to left MCA infarct which require 3+ hours per day of interdisciplinary therapy in a comprehensive inpatient rehab setting.  Physiatrist is providing close team supervision and  24 hour management of active medical problems listed below.  Physiatrist and rehab team continue to assess barriers to discharge/monitor patient progress toward functional and medical goals  Care Tool:  Bathing    Body parts bathed by patient: Right arm, Chest, Right upper leg, Left upper leg, Front perineal area, Abdomen, Face   Body parts bathed by helper: Left arm, Right lower leg, Left lower leg, Buttocks     Bathing assist Assist Level: Moderate Assistance - Patient 50 - 74%     Upper Body Dressing/Undressing Upper body dressing   What is the patient wearing?: Pull over shirt, Bra    Upper body assist Assist Level: Moderate Assistance - Patient 50 - 74%    Lower Body Dressing/Undressing Lower body dressing      What is the patient wearing?: Underwear/pull up, Pants     Lower body assist Assist for lower body dressing: Maximal Assistance - Patient 25 - 49%     Toileting Toileting    Toileting assist Assist for toileting: Moderate Assistance - Patient 50 - 74%     Transfers Chair/bed transfer  Transfers assist     Chair/bed transfer assist level: Minimal Assistance - Patient > 75%     Locomotion Ambulation   Ambulation assist      Assist level: Minimal Assistance - Patient > 75% Assistive device: Walker-rolling Max distance: 30 ft   Walk 10 feet activity   Assist  Walk 10 feet activity did not occur: Safety/medical concerns(R hemi, decreased activity tolerance and R knee  buckling)  Assist level: Minimal Assistance - Patient > 75% Assistive device: Walker-rolling   Walk 50 feet activity   Assist Walk 50 feet with 2 turns activity did not occur: Safety/medical concerns(R hemi, decreased activity tolerance and R knee buckling)         Walk 150 feet activity   Assist Walk 150 feet activity did not occur: Safety/medical concerns(R hemi, decreased activity tolerance and R knee buckling)         Walk 10 feet on uneven surface   activity   Assist Walk 10 feet on uneven surfaces activity did not occur: Safety/medical concerns(R hemi, decreased activity tolerance and R knee buckling)         Wheelchair     Assist Will patient use wheelchair at discharge?: No(LT goals not set) Type of Wheelchair: Manual    Wheelchair assist level: Maximal Assistance - Patient 25 - 49% Max wheelchair distance: 100 ft    Wheelchair 50 feet with 2 turns activity    Assist        Assist Level: Maximal Assistance - Patient 25 - 49%   Wheelchair 150 feet activity     Assist      Assist Level: Total Assistance - Patient < 25%   Blood pressure 129/64, pulse 63, temperature 98.3 F (36.8 C), resp. rate 20, height 5\' 5"  (1.651 m), SpO2 100 %.  Medical Problem List and Plan: 1.  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  Antithrombotics: -DVT/anticoagulation: Lovenox 40 mg daily             -antiplatelet therapy: Aspirin 325 mg daily, Brilinta 90 mg twice daily 3. Pain Management: Tylenol as needed 4. Mood: Amantadine 100 mg daily             -antipsychotic agents: N/A             -monitor for depression 5. Neuropsych: This patient is capable of making decisions on her own behalf. 6. Skin/Wound Care: Routine skin checks 7. Fluids/Electrolytes/Nutrition: encourage PO  -reasonable intake so far   8.  Orthostasis.  ProAmatine 10 mg 3 times daily.  Monitor with increased activities with therapy 9.  Hyperlipidemia.  Lipitor 10. CV: Has follow-up with EP cardiology on 11/09/19 to discuss loop recorder placement.     LOS: 2 days A FACE TO FACE EVALUATION WAS PERFORMED  11/11/19 10/13/2019, 12:34 PM

## 2019-10-13 NOTE — Progress Notes (Signed)
Physical Therapy Session Note  Patient Details  Name: Erin Peck MRN: [id number] Date of Birth: Jan 14, 1966  Today's Date: 10/13/2019 PT Individual Time: 2194-7125 PT Individual Time Calculation (min): 43 min   Short Term Goals: Week 1:  PT Short Term Goal 1 (Week 1): Patient will perform bed mobility with supervision using compensatory strategies as needed. PT Short Term Goal 2 (Week 1): Patient will perform basic transfers with CGA using LRAD. PT Short Term Goal 3 (Week 1): Patient will ambulate >50 ft with min A using LRAD.  Skilled Therapeutic Interventions/Progress Updates:    Patient received in [facility], family present and husband continuing to ask about splint for her R UE; assured him that this PT would follow up. Patient states she is extremely tired this afternoon so performed modified session. In general increased levels of assist required this afternoon for safety due to fatigue. Transported her to hallway outside of gym for energy conservation then tolerated gait training approximately 56f with MinA and counterpressure to R quad due to significant knee buckling this afternoon (likely due to fatigue), also needed assist to maintain appropriate BOS due to scissoring pattern. Did require ModA for safety with transfers with RW due to R knee buckling and tendency to "dive" for table or bed while allowing feet to cross. Worked on mat exercises to improve RLE strength, coordination and NMR control. [facility] then required [place] back to bed. Educated family that according to the timelines we are aware of now, UE brace might possibly arrive today. Patient left in bed with all needs met, bed alarm active and family present.   Therapy Documentation Precautions:  Precautions Precautions: Fall Precaution Comments: R hemiplegia Restrictions Weight Bearing Restrictions: No Pain: Pain Assessment Pain Scale: Faces Faces Pain Scale: Hurts a little  bit Pain Type: Acute pain Pain Location: Leg Pain Orientation: Right Pain Descriptors / Indicators: Discomfort Pain Onset: On-going Patients Stated Pain Goal: 0 Pain Intervention(s): Repositioned;Ambulation/increased activity;Distraction    Therapy/Group: Individual Therapy   KWindell Norfolk DPT, PN1   Supplemental Physical Therapist [place]   Pager [phone] Rehab Office [phone]   10/13/2019, 3:55 PM

## 2019-10-13 NOTE — Progress Notes (Signed)
Orthopedic Tech Progress Note Patient Details:  Erin Peck April 30, 1966 [id number]  Patient ID: Erin Peck, female   DOB: [birth year], 54 y.o.   MRN: [id number]   Erin Peck 10/13/2019, 1:09 PMRouted brace order to [facility].

## 2019-10-13 NOTE — Care Management (Signed)
Inpatient Rehabilitation Center Individual Statement of Services  Patient Name:  Erin Peck  Date:  10/13/2019  Welcome to the Inpatient Rehabilitation Center.  Our goal is to provide you with an individualized program based on your diagnosis and situation, designed to meet your specific needs.  With this comprehensive rehabilitation program, you will be expected to participate in at least 3 hours of rehabilitation therapies Monday-Friday, with modified therapy programming on the weekends.  Your rehabilitation program will include the following services:  Physical Therapy (PT), Occupational Therapy (OT), Speech Therapy (ST), 24 hour per day rehabilitation nursing, Therapeutic Recreaction (TR), Psychology, Neuropsychology, Case Management (Social Worker), Rehabilitation Medicine, Nutrition Services, Pharmacy Services and Other  Weekly team conferences will be held on Tuesdays  to discuss your progress.  Your Social Worker will talk with you frequently to get your input and to update you on team discussions.  Team conferences with you and your family in attendance may also be held.  Expected length of stay: 2-3 weeks    Overall anticipated outcome: Supervision  Depending on your progress and recovery, your program may change. Your Social Worker will coordinate services and will keep you informed of any changes. Your Social Worker's name and contact numbers are listed  below.  The following services may also be recommended but are not provided by the Inpatient Rehabilitation Center:   Driving Evaluations  Home Health Rehabiltiation Services  Outpatient Rehabilitation Services  Vocational Rehabilitation   Arrangements will be made to provide these services after discharge if needed.  Arrangements include referral to agencies that provide these services.  Your insurance has been verified to be: Uninsured   Your primary doctor is:  No PCP  Pertinent information will be shared with  your doctor and your insurance company.  Social Worker:  Cecile Sheerer, LCSWA  Information discussed with and copy given to patient by: Gretchen Short, 10/13/2019, 2:38 PM

## 2019-10-13 NOTE — IPOC Note (Signed)
[facility] Patient Details Name: Erin Peck MRN: [id number] DOB: 1966/01/27  Admitting Diagnosis: Left middle cerebral artery stroke [facility])  Hospital Problems: Principal Problem:   Left middle cerebral artery stroke [facility])     Functional Problem List: Nursing Bladder, Bowel, Endurance, Medication Management, Nutrition, Safety, Perception  PT Balance, Behavior, Edema, Endurance, Motor, Nutrition, Pain, Perception, Safety, Sensory, Skin Integrity  OT Balance, Cognition, Endurance, Motor, Pain, Perception, Safety, Sensory  SLP Cognition, Nutrition  TR         Basic ADL's: OT Eating, Grooming, Bathing, Dressing, Toileting     Advanced  ADL's: OT       Transfers: PT Bed Mobility, Bed to Chair, Car, Occupational psychologist, Research scientist (life sciences): PT Ambulation, Psychologist, prison and probation services, Stairs     Additional Impairments: OT Fuctional Use of Upper Extremity  SLP Swallowing, Communication, Social Cognition expression Problem Solving, Attention, Awareness  TR      Anticipated Outcomes Item Anticipated Outcome  Self Feeding Ste-up A  Swallowing  Mod I   Basic self-care  Marketing executive Transfers Supervision  Bowel/Bladder  manage with mod assist  Transfers  mod I using LRAD  Locomotion  Supervision >150 ft using LRAD  Communication  Supervision  Cognition  Supervision  Pain  to remain pain free  Safety/Judgment  Adhere to safety plan   Therapy Plan: PT Intensity: Minimum of 1-2 x/day ,45 to 90 minutes PT Frequency: 5 out of 7 days PT Duration Estimated Length of Stay: 2-3 weeks OT Intensity: Minimum of 1-2 x/day, 45 to 90 minutes OT Frequency: 5 out of 7 days OT Duration/Estimated Length of Stay: 3 weeks SLP Intensity: Minumum of 1-2 x/day, 30 to 90 minutes SLP Frequency: 1 to 3 out of 7 days SLP Duration/Estimated Length of Stay: 3 weeks   Due to the current state of emergency, patients may not be  receiving their 3-hours of Medicare-mandated therapy.   Team Interventions: Nursing Interventions Patient/Family Education, Bladder Management, Bowel Management, Disease Management/Prevention, Medication Management, Skin Care/Wound Management, Dysphagia/Aspiration Precaution Training, Discharge Planning, Psychosocial Support  PT interventions Ambulation/gait training, Community reintegration, DME/adaptive equipment instruction, Neuromuscular re-education, Psychosocial support, Stair training, UE/LE Strength taining/ROM, Wheelchair propulsion/positioning, Warden/ranger, Discharge planning, Functional electrical stimulation, Pain management, Skin care/wound management, Therapeutic Activities, UE/LE Coordination activities, Cognitive remediation/compensation, Disease management/prevention, Functional mobility training, Patient/family education, Splinting/orthotics, Therapeutic Exercise, Visual/perceptual remediation/compensation  OT Interventions Warden/ranger, Cognitive remediation/compensation, Community reintegration, Discharge planning, DME/adaptive equipment instruction, Functional electrical stimulation, Functional mobility training, Neuromuscular re-education, Pain management, Patient/family education, Self Care/advanced ADL retraining, Splinting/orthotics, Therapeutic Activities, Therapeutic Exercise, UE/LE Strength taining/ROM, UE/LE Coordination activities, Wheelchair propulsion/positioning  SLP Interventions Cognitive remediation/compensation, Dysphagia/aspiration precaution training, Internal/external aids, Speech/Language facilitation, Therapeutic Activities, Environmental controls, Cueing hierarchy, Functional tasks, Patient/family education  TR Interventions    SW/CM Interventions     Barriers to Discharge MD  Medical stability  Nursing Home environment access/layout, Medical stability, Incontinence, Weight, Medication compliance, Decreased caregiver support     PT Home environment access/layout 1 STE home without rails  OT      SLP      SW       Team Discharge Planning: Destination: PT-Home ,OT- Home , SLP-Home Projected Follow-up: PT-Outpatient PT, OT-  Home health OT, SLP-Outpatient SLP, 24 hour supervision/assistance, Home Health SLP Projected Equipment Needs: PT-To be determined, OT- To be determined, SLP-None recommended by SLP Equipment Details: PT-Patient has a RW, will determine further DME needs based  on patient progress, OT-Likely a tub transfer bench and 3-in-1 BSC Patient/family involved in discharge planning: PT- Patient, Family member/caregiver,  OT-Patient, SLP-Patient, Family member/caregiver  MD ELOS: 17-22 days Medical Rehab Prognosis:  Excellent Assessment: The patient has been admitted for CIR therapies with the diagnosis of left MCA infarct. The team will be addressing functional mobility, strength, stamina, balance, safety, adaptive techniques and equipment, self-care, bowel and bladder mgt, patient and caregiver education, NMR, visual-perceptual rx, cognition, language, pain mgt. Goals have been set at supervision for self-care, supervision to mod I with mobility and supervision for language and cogntion.   Due to the current state of emergency, patients may not be receiving their 3 hours per day of Medicare-mandated therapy.    Meredith Staggers, MD, FAAPMR      See Team Conference Notes for weekly updates to the plan of care

## 2019-10-13 NOTE — Progress Notes (Signed)
Occupational Therapy Session Note  Patient Details  Name: Erin Peck MRN: [id number] Date of Birth: [birth year]  Today's Date: 10/13/2019 OT Individual Time: 0865-7846 OT Individual Time Calculation (min): 55 min    Short Term Goals: Week 1:  OT Short Term Goal 1 (Week 1): Pt will complete tub shower transfer with consistent min A OT Short Term Goal 2 (Week 1): Pt will complete sit<>stand  with min A OT Short Term Goal 3 (Week 1): Pt will mainatain dynamic standing balance within BADL tasks with min A  Skilled Therapeutic Interventions/Progress Updates:    Pt greeted transferring onto commode with nurse tech, handoff to OT. Pt had continent void of bladder and was able to complete peri-care with CGA for balance when using hip hike to wash bottom and peri-area. Pt needed mod A to maintain balance in standing while OT and pt assisted with pulling up pants. Pt with R knee buckle with weight bearing. Pt transferred back to wc with mod A. Pt brought to the sink in wc to wash hands with verbal cues to bring R UE to the sink. Pt was then set-up for breakfast. Pt needed OT assist to open containers and cut sausage. She was then able to manage feeding utensils with L hand. Pt brought to therapy gym and worked on [place] Ruiz with weight bearing towel pushes in standing. OT facilitated weight bearing through R LE in standing and needed guided hand over hand A to push and pull towel. Slight scapula activation noted. Pt returned to room. OT placed SAEBO e-stim on wrist extensors. SAEBO left on for 60 minutes. OT returned to remove SAEBO with skin intact and no adverse reactions.  [place] Stim One 330 pulse width 35 Hz pulse rate On 8 sec/ off 8 sec Ramp up/ down 2 sec Symmetrical Biphasic wave form  Max intensity 125m at 500 Ohm load  Alarm belt on, call bell in reach, and needs met.   Therapy Documentation Precautions:  Precautions Precautions: Fall Precaution Comments: R  hemiplegia Restrictions Weight Bearing Restrictions: No Pain:   denies pain   Therapy/Group: Individual Therapy  EValma Cava3/19/2021, 7:53 AM

## 2019-10-13 NOTE — Progress Notes (Signed)
Physical Therapy Session Note  Patient Details  Name: Erin Peck MRN: [id number] Date of Birth: 1965/08/01  Today's Date: 10/13/2019 PT Individual Time: 1106-1201 PT Individual Time Calculation (min): 55 min   Short Term Goals: Week 1:  PT Short Term Goal 1 (Week 1): Patient will perform bed mobility with supervision using compensatory strategies as needed. PT Short Term Goal 2 (Week 1): Patient will perform basic transfers with CGA using LRAD. PT Short Term Goal 3 (Week 1): Patient will ambulate >50 ft with min A using LRAD.  Skilled Therapeutic Interventions/Progress Updates:  Pt received in w/c & agreeable to tx. Transported pt to gym via w/c dependent assist for time management. Sit>stand with min assist, stand>sit with mod assist with max cuing for hand placement for increased safety. Gait x 20 ft + 30 ft with RW, R hand orthosis with min assist with pt able to advance RLE with therapist providing tactile cuing at R knee to prevent A/P instability, max cuing for sequencing turning for increased safety. Stand pivot transfer w/c<>kinetron with min assist with cuing for RLE placement. Kinetron while seated 1 minute x 2 with focus on BLE strengthening & R NMR. Progressed to kinetron while standing with LUE support & min assist with focus on weight shifting L<>R, RLE NMR & strengthening, & dynamic balance. 10x sit<>stand x 2 sets with cuing for increased weight shifting to R with focus on equal weight bearing BLE vs more on LLE for RLE strengthening & NMR, and strengthening through transitional movement. Pt propels w/c back to room with L hemi technique & max assist 2/2 difficulty coordinating LUE & LLE and difficulty reaching floor with foot to steer even with lowered w/c seat height. At end of session pt left in w/c with chair alarm donned, call bell in reach & family present. Notified PA of pt's husband asking for RUE hand splint.   Therapy Documentation Precautions:   Precautions Precautions: Fall Precaution Comments: R hemiplegia Restrictions Weight Bearing Restrictions: No  Pain: Pt denied c/o pain.    Therapy/Group: Individual Therapy  Sandi Mariscal 10/13/2019, 12:28 PM

## 2019-10-13 NOTE — Progress Notes (Signed)
Speech Language Pathology Daily Session Note  Patient Details  Name: Erin Peck MRN: [id number] Date of Birth: May 29, 1966  Today's Date: 10/13/2019 SLP Individual Time: 9147-8295 SLP Individual Time Calculation (min): 43 min  Short Term Goals: Week 1: SLP Short Term Goal 1 (Week 1): Patient will consume current diet with minimal overt s/s of aspiration and overall Mod I for use of swallowing compensatory strategies. SLP Short Term Goal 2 (Week 1): Patient will utilize speech intelligibility strategies at the phrase level to achieve 80% intelligibility with Mod verbal cues. SLP Short Term Goal 3 (Week 1): Patient will demonstrate functional problem solving for basic and famliar tasks with Mod verbal cues. SLP Short Term Goal 4 (Week 1): Patient will attend to right field of enviornment/body with Min verbal cues. SLP Short Term Goal 5 (Week 1): Patient will self-monitor and correct verbal errors at the phrase level with Min verbal cues. SLP Short Term Goal 6 (Week 1): Patient will utilize word-finding strategies at the phrase level with mod verbal cues.  Skilled Therapeutic Interventions:  Skilled treatment session targeted pt's communication goals and providing caregiver education to pt's husband and daughter ([name]). Per family request BSE/SLE reviewed with family, goals and POC. All questions were answered to their satisfaction. SLP further facilitated session by introducing SLOP and providing examples. With demonstration and Min A verbal reminders pt able to increase vocal intensity at the sentence level and thus increased her speech intelligibility to ~80% at sentence level. To target word finding deficits, SLP provided Mod A faded to Min A cues (semantic cues, yes/no questions and multiple choice) to express information related to her family. SLP wrote this information down in various ways for pt to read while using increased vocal intensity and precise articulation and to help with word  finding. Pt left upright in wheelchair, alarm on and all needs within reach, her family present. Continue per current plan of care.       Pain    Therapy/Group: Individual Therapy  Redonna Wilbert 10/13/2019, 1:56 PM

## 2019-10-14 ENCOUNTER — Inpatient Hospital Stay (HOSPITAL_COMMUNITY): Payer: Self-pay | Admitting: Physical Therapy

## 2019-10-14 NOTE — Progress Notes (Signed)
[place] PHYSICAL MEDICINE & REHABILITATION PROGRESS NOTE   Subjective/Complaints:    Objective:   No results found. Recent Labs    10/11/19 2208 10/12/19 0537  WBC 10.3 8.6  HGB 10.9* 11.0*  HCT 32.9* 33.8*  PLT 444* 474*   Recent Labs    10/11/19 2208 10/12/19 0537  NA 143 141  K 3.9 3.7  CL 111 109  CO2 21* 22  GLUCOSE 103* 87  BUN 20 19  CREATININE 0.73 0.83  CALCIUM 9.2 9.0    Intake/Output Summary (Last 24 hours) at 10/14/2019 1202 Last data filed at 10/14/2019 0900 Gross per 24 hour  Intake 1164 ml  Output --  Net 1164 ml     Physical Exam: Vital Signs Blood pressure 130/62, pulse 60, temperature (!) 97.5 F (36.4 C), temperature source Oral, resp. rate 16, height 5\' 5"  (1.651 m), weight 69.7 kg, SpO2 98 %.   General: No acute distress Mood and affect are appropriate Heart: Regular rate and rhythm no rubs murmurs or extra sounds Lungs: Clear to auscultation, breathing unlabored, no rales or wheezes Abdomen: Positive bowel sounds, soft nontender to palpation, nondistended Extremities: No clubbing, cyanosis, or edema Skin: No evidence of breakdown, no evidence of rash Neurologic: Cranial nerves II through XII intact, motor strength is 5/5 in left 0/5 right  deltoid, bicep, tricep, grip,5/5 left , 2-/5 Right  hip flexor, knee extensors, ankle dorsiflexor and plantar flexor  Musculoskeletal: Full range of motion in all 4 extremities. No joint swelling   Assessment/Plan: 1. Functional deficits secondary to L MCA infarct  which require 3+ hours per day of interdisciplinary therapy in a comprehensive inpatient rehab setting.  Physiatrist is providing close team supervision and 24 hour management of active medical problems listed below.  Physiatrist and rehab team continue to assess barriers to discharge/monitor patient progress toward functional and medical goals  Care Tool:  Bathing    Body parts bathed by patient: Right arm, Chest, Right upper  leg, Left upper leg, Front perineal area, Abdomen, Face   Body parts bathed by helper: Left arm, Right lower leg, Left lower leg, Buttocks     Bathing assist Assist Level: Moderate Assistance - Patient 50 - 74%     Upper Body Dressing/Undressing Upper body dressing   What is the patient wearing?: Pull over shirt    Upper body assist Assist Level: Moderate Assistance - Patient 50 - 74%    Lower Body Dressing/Undressing Lower body dressing      What is the patient wearing?: Underwear/pull up, Pants     Lower body assist Assist for lower body dressing: Moderate Assistance - Patient 50 - 74%     Toileting Toileting    Toileting assist Assist for toileting: Moderate Assistance - Patient 50 - 74%     Transfers Chair/bed transfer  Transfers assist     Chair/bed transfer assist level: Moderate Assistance - Patient 50 - 74%     Locomotion Ambulation   Ambulation assist      Assist level: Minimal Assistance - Patient > 75% Assistive device: Walker-rolling Max distance: 69ft   Walk 10 feet activity   Assist  Walk 10 feet activity did not occur: Safety/medical concerns(R hemi, decreased activity tolerance and R knee buckling)  Assist level: Minimal Assistance - Patient > 75% Assistive device: Walker-rolling   Walk 50 feet activity   Assist Walk 50 feet with 2 turns activity did not occur: Safety/medical concerns(R hemi, decreased activity tolerance and R knee buckling)  Walk 150 feet activity   Assist Walk 150 feet activity did not occur: Safety/medical concerns(R hemi, decreased activity tolerance and R knee buckling)         Walk 10 feet on uneven surface  activity   Assist Walk 10 feet on uneven surfaces activity did not occur: Safety/medical concerns(R hemi, decreased activity tolerance and R knee buckling)         Wheelchair     Assist Will patient use wheelchair at discharge?: No(LT goals not set) Type of Wheelchair:  Manual    Wheelchair assist level: Maximal Assistance - Patient 25 - 49% Max wheelchair distance: 100 ft    Wheelchair 50 feet with 2 turns activity    Assist        Assist Level: Maximal Assistance - Patient 25 - 49%   Wheelchair 150 feet activity     Assist      Assist Level: Total Assistance - Patient < 25%   Blood pressure 130/62, pulse 60, temperature (!) 97.5 F (36.4 C), temperature source Oral, resp. rate 16, height 5\' 5"  (1.651 m), weight 69.7 kg, SpO2 98 %.   Medical Problem List and Plan: 1.  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  Antithrombotics: -DVT/anticoagulation: Lovenox 40 mg daily             -antiplatelet therapy: Aspirin 325 mg daily, Brilinta 90 mg twice daily 3. Pain Management: Tylenol as needed 4. Mood: Amantadine 100 mg daily             -antipsychotic agents: N/A             -Denies depressed mood at this time but has flat affect and paucity of speech.  5. Neuropsych: This patient is capable of making decisions on her own behalf. 6. Skin/Wound Care: Routine skin checks 7. Fluids/Electrolytes/Nutrition: Routine in and outs with follow-up chemistries 8.  Orthostasis.  ProAmatine 10 mg 3 times daily.  Monitor with increased mobility Ortho BP qam  9.  Hyperlipidemia.  Lipitor 10. Disposition: Has follow-up with EP cardiology on 11/09/19 to discuss loop recorder placement.   Discussed timeline of recovery with pt, her husband and daughter including need for inpt and outpt therapy   LOS: 3 days A FACE TO [place] E Marlita Keil 10/14/2019, 12:02 PM

## 2019-10-14 NOTE — Progress Notes (Signed)
Physical Therapy Session Note  Patient Details  Name: Erin Peck MRN: [id number] Date of Birth: Jul 25, 1966  Today's Date: 10/14/2019 PT Individual Time: 1100-1200 PT Individual Time Calculation (min): 60 min   Short Term Goals: Week 1:  PT Short Term Goal 1 (Week 1): Patient will perform bed mobility with supervision using compensatory strategies as needed. PT Short Term Goal 2 (Week 1): Patient will perform basic transfers with CGA using LRAD. PT Short Term Goal 3 (Week 1): Patient will ambulate >50 ft with min A using LRAD.  Skilled Therapeutic Interventions/Progress Updates:    Patient received in [facility], very pleasant and motivated to participate in PT today. Transported her to nustep totalA in [facility] for energy conservation, then required MinA for transfer to [employer] seat; worked on [employer] of Nustep for 7 minutes at level 1 for improved RLE muscle activation and coordination with use of therband to maintain good ankle positioning and manual facilitation for correct knee alignment while on machine. Required MinA to push with R LE for first half of time, then progressed to only needing assist for LE positioning in second half of time on Nustep. Fatigued after work on [employer] and needed [place] and Mod-Max VCs for safe transfers with RW for remainder of session. Spent time working on RLE NMR via activities targeting proprioception and gross motor control, including tasks that involved tapping targets in standing and sitting with 0.5# for improved proprioceptive feedback, as well as seated activities targeting muscle coordination patterns from WC level. Worked on gait training with Mod cues for improved ankle dorsiflexion and heel strike during swing phase as well as improving stance time R LE to allow for more consistent step length with the L LE. Continues to need at least tactile stimulation R quad to reduce buckling however this can increase to as much as heavy MinA at quad to reduce buckling  during stance. Has a tendency to get quite unsafe during transfers when fatigued, self-selecting reduced WB R LE and abruptly pivoting, requiring Mod-MaxA to maintain balance and safety when this occurs- appropriate education and intervention provided, also educated regarding fatigue and energy management following CVA. She was left up in the [facility] with all needs met, seatbelt alarm active and all needs met, family present this afternoon.   Therapy Documentation Precautions:  Precautions Precautions: Fall Precaution Comments: R hemiplegia Restrictions Weight Bearing Restrictions: No   Pain: Pain Assessment Pain Scale: 0-10 Pain Score: 0-No pain Faces Pain Scale: No hurt    Therapy/Group: Individual Therapy   Windell Norfolk, DPT, PN1   Supplemental Physical Therapist [place]    Pager [phone] Acute Rehab Office [phone]    10/14/2019, 12:07 PM

## 2019-10-14 NOTE — Progress Notes (Signed)
Physical Therapy Session Note  Patient Details  Name: Erin Peck MRN: [id number] Date of Birth: October 06, 1965  Today's Date: 10/14/2019 PT Individual Time: 1320-1415 PT Individual Time Calculation (min): 55 min   Short Term Goals: Week 1:  PT Short Term Goal 1 (Week 1): Patient will perform bed mobility with supervision using compensatory strategies as needed. PT Short Term Goal 2 (Week 1): Patient will perform basic transfers with CGA using LRAD. PT Short Term Goal 3 (Week 1): Patient will ambulate >50 ft with min A using LRAD.  Skilled Therapeutic Interventions/Progress Updates:    Patient received up in [facility] but lunch had just arrived and she requested 20 minutes to try to eat. Returned after the requested time with family and RN asking for therapist to train daughter in bathroom transfers. Performed and assessed her bathroom transfers and determined that the safest way for family to assist at this time is to have patient stand at grab bar in the bathroom, perform sit to stand (able to do so with S at grab bar), and have family change out WC and BSC underneath her- do NOT feel she is safe to pivot with family at this time due to R knee buckling/R ankle inverting and had very extensive and frank discussion/demonstration with family as to why them trying to pivot with her is not safe or signed off on by therapy at this time. Able to toilet with S, required Min-ModA for balance for pericare in standing. Otherwise focused the remainder of this session on blocked practice transfer training in quiet gym with no distractions- continues to require min-modA to control R knee buckling/R ankle inversion when fatigued, as well as Mod VC for consistent safe technique with hand placement, appropriate BOS, and correct positioning of RW. Really seemed to benefit from blocked practice today. Returned to her room and provided ongoing education about technique for family to assist with bathroom (static standing at  grab bar and switching out BSC for WC, no pivoting with family yet) as well as general prognosis and therapy interventions moving forward. She was left up in the [facility] with all needs met, seatbelt alarm active this afternoon.   Therapy Documentation Precautions:  Precautions Precautions: Fall Precaution Comments: R hemiplegia Restrictions Weight Bearing Restrictions: No General: PT Amount of Missed Time (min): 20 Minutes PT Missed Treatment Reason: Other (Comment)(lunch had just arrived/eating) Pain: Pain Assessment Pain Scale: 0-10 Pain Score: 0-No pain Faces Pain Scale: No hurt    Therapy/Group: Individual Therapy   Windell Norfolk, DPT, PN1   Supplemental Physical Therapist [place]    Pager [phone] Acute Rehab Office [phone]    10/14/2019, 3:47 PM

## 2019-10-15 ENCOUNTER — Inpatient Hospital Stay (HOSPITAL_COMMUNITY): Payer: Self-pay | Admitting: Speech Pathology

## 2019-10-15 MED ORDER — MIDODRINE HCL 5 MG PO TABS
10.0000 mg | ORAL_TABLET | Freq: Three times a day (TID) | ORAL | Status: DC
  Administered 2019-10-15 – 2019-10-17 (×6): 10 mg via ORAL
  Filled 2019-10-15 (×6): qty 2

## 2019-10-15 NOTE — Progress Notes (Signed)
Slept good. Calling for assistance to BR. Expressive aphasia, but able to make needs known. Alfredo Martinez A

## 2019-10-15 NOTE — Progress Notes (Signed)
Speech Language Pathology Daily Session Note  Patient Details  Name: Erin Peck MRN: [id number] Date of Birth: [birth year]  Today's Date: 10/15/2019 SLP Individual Time: 0805-0900 SLP Individual Time Calculation (min): 55 min  Short Term Goals: Week 1: SLP Short Term Goal 1 (Week 1): Patient will consume current diet with minimal overt s/s of aspiration and overall Mod I for use of swallowing compensatory strategies. SLP Short Term Goal 2 (Week 1): Patient will utilize speech intelligibility strategies at the phrase level to achieve 80% intelligibility with Mod verbal cues. SLP Short Term Goal 3 (Week 1): Patient will demonstrate functional problem solving for basic and famliar tasks with Mod verbal cues. SLP Short Term Goal 4 (Week 1): Patient will attend to right field of enviornment/body with Min verbal cues. SLP Short Term Goal 5 (Week 1): Patient will self-monitor and correct verbal errors at the phrase level with Min verbal cues. SLP Short Term Goal 6 (Week 1): Patient will utilize word-finding strategies at the phrase level with mod verbal cues.  Skilled Therapeutic Interventions:  Pt was seen for skilled ST targeting communication goals.  Upon arrival, pt stated that she was probably going home "tomorrow."  SLP reviewed anticipated length of stay and explained to pt that the therapy team recommended that pt stay on CIR for 3 weeks.  Pt stated that she may leave because of a "mountain of issues."  Pt did not elaborate further but expressed that she was not happy with certain aspects of her care.  SLP encouraged pt to reach out to staff members with specific complaints so that the team could address them in order to improve her experience.  SLP also explained the benefits of staying on CIR for maximizing her functional independence prior to discharge.  Pt was appreciative and pleasant and remained willing to participate in therapy.  SLP facilitated the session with review of speech  intelligibility strategies and skilled education regarding compensatory word finding strategies.  Pt utilized strategies in a novel verbal description barrier task with mod assist verbal cues.  Pt was left in wheelchair with chair alarm set and call bell within reach.  Continue per current plan of care.    Pain Pain Assessment Pain Scale: 0-10 Pain Score: 0-No pain  Therapy/Group: Individual Therapy  Delanie Tirrell, Melanee Spry 10/15/2019, 9:02 AM

## 2019-10-15 NOTE — Progress Notes (Signed)
[place] PHYSICAL MEDICINE & REHABILITATION PROGRESS NOTE   Subjective/Complaints:  Pt wishes to leave ASAP, discussed rec for higher intensity THerapy immediately after stroke still has severe weakness in RIght arm Also discussed IR recs for higher BP 161-096 systolic post stent   ROS- neg CP, SOB, N/V/D  Objective:   No results found. No results for input(s): WBC, HGB, HCT, PLT in the last 72 hours. No results for input(s): NA, K, CL, CO2, GLUCOSE, BUN, CREATININE, CALCIUM in the last 72 hours.  Intake/Output Summary (Last 24 hours) at 10/15/2019 1226 Last data filed at 10/14/2019 1300 Gross per 24 hour  Intake 240 ml  Output --  Net 240 ml     Physical Exam: Vital Signs Blood pressure (!) 114/95, pulse 83, temperature 97.7 F (36.5 C), temperature source Oral, resp. rate 18, height 5\' 5"  (1.651 m), weight 67.7 kg, SpO2 100 %.   General: No acute distress Mood and affect are appropriate Heart: Regular rate and rhythm no rubs murmurs or extra sounds Lungs: Clear to auscultation, breathing unlabored, no rales or wheezes Abdomen: Positive bowel sounds, soft nontender to palpation, nondistended Extremities: No clubbing, cyanosis, or edema Skin: No evidence of breakdown, no evidence of rash Neurologic: Cranial nerves II through XII intact, motor strength is 5/5 in left 0/5 right  deltoid, bicep, tricep, grip,5/5 left , 2-/5 Right  hip flexor, knee extensors, ankle dorsiflexor and plantar flexor  Musculoskeletal: Full range of motion in all 4 extremities. No joint swelling   Assessment/Plan: 1. Functional deficits secondary to L MCA infarct  which require 3+ hours per day of interdisciplinary therapy in a comprehensive inpatient rehab setting.  Physiatrist is providing close team supervision and 24 hour management of active medical problems listed below.  Physiatrist and rehab team continue to assess barriers to discharge/monitor patient progress toward functional and  medical goals  Care Tool:  Bathing    Body parts bathed by patient: Right arm, Chest, Right upper leg, Left upper leg, Front perineal area, Abdomen, Face   Body parts bathed by helper: Left arm, Right lower leg, Left lower leg, Buttocks     Bathing assist Assist Level: Moderate Assistance - Patient 50 - 74%     Upper Body Dressing/Undressing Upper body dressing   What is the patient wearing?: Pull over shirt    Upper body assist Assist Level: Moderate Assistance - Patient 50 - 74%    Lower Body Dressing/Undressing Lower body dressing      What is the patient wearing?: Underwear/pull up, Pants     Lower body assist Assist for lower body dressing: Moderate Assistance - Patient 50 - 74%     Toileting Toileting    Toileting assist Assist for toileting: Moderate Assistance - Patient 50 - 74%     Transfers Chair/bed transfer  Transfers assist     Chair/bed transfer assist level: Moderate Assistance - Patient 50 - 74%     Locomotion Ambulation   Ambulation assist      Assist level: Minimal Assistance - Patient > 75% Assistive device: Walker-rolling Max distance: 8ft   Walk 10 feet activity   Assist  Walk 10 feet activity did not occur: Safety/medical concerns(R hemi, decreased activity tolerance and R knee buckling)  Assist level: Minimal Assistance - Patient > 75% Assistive device: Walker-rolling   Walk 50 feet activity   Assist Walk 50 feet with 2 turns activity did not occur: Safety/medical concerns(R hemi, decreased activity tolerance and R knee buckling)  Walk 150 feet activity   Assist Walk 150 feet activity did not occur: Safety/medical concerns(R hemi, decreased activity tolerance and R knee buckling)         Walk 10 feet on uneven surface  activity   Assist Walk 10 feet on uneven surfaces activity did not occur: Safety/medical concerns(R hemi, decreased activity tolerance and R knee buckling)          Wheelchair     Assist Will patient use wheelchair at discharge?: No(LT goals not set) Type of Wheelchair: Manual    Wheelchair assist level: Maximal Assistance - Patient 25 - 49% Max wheelchair distance: 100 ft    Wheelchair 50 feet with 2 turns activity    Assist        Assist Level: Maximal Assistance - Patient 25 - 49%   Wheelchair 150 feet activity     Assist      Assist Level: Total Assistance - Patient < 25%   Blood pressure (!) 114/95, pulse 83, temperature 97.7 F (36.5 C), temperature source Oral, resp. rate 18, height 5\' 5"  (1.651 m), weight 67.7 kg, SpO2 100 %.   Medical Problem List and Plan: 1.  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  Antithrombotics: -DVT/anticoagulation: Lovenox 40 mg daily             -antiplatelet therapy: Aspirin 325 mg daily, Brilinta 90 mg twice daily 3. Pain Management: Tylenol as needed 4. Mood: Amantadine 100 mg daily             -antipsychotic agents: N/A             -Denies depressed mood at this time but has flat affect and paucity of speech.  5. Neuropsych: This patient is capable of making decisions on her own behalf. 6. Skin/Wound Care: Routine skin checks 7. Fluids/Electrolytes/Nutrition: Routine in and outs with follow-up chemistries 8.  Orthostasis.  ProAmatine 5 mg 3 times daily.  Ortho BP qam  + orthostasis, will increase proamatine to 10mg   9.  Hyperlipidemia.  Lipitor 10. Disposition: Has follow-up with EP cardiology on 11/09/19 to discuss loop recorder placement.   Discussed timeline of recovery with pt, her husband and daughter including need for inpt and outpt therapy  Discussed rec for intensive therapy and the difference in therapy hrs per week on inpt rehab vs HH  LOS: 4 days A FACE TO FACE EVALUATION  WAS PERFORMED  10/15/2019, 12:26 PM

## 2019-10-16 ENCOUNTER — Inpatient Hospital Stay (HOSPITAL_COMMUNITY): Payer: Self-pay | Admitting: Occupational Therapy

## 2019-10-16 ENCOUNTER — Inpatient Hospital Stay (HOSPITAL_COMMUNITY): Payer: Self-pay | Admitting: Speech Pathology

## 2019-10-16 ENCOUNTER — Inpatient Hospital Stay (HOSPITAL_COMMUNITY): Payer: Self-pay | Admitting: Physical Therapy

## 2019-10-16 NOTE — Progress Notes (Signed)
Occupational Therapy Session Note  Patient Details  Name: Erin Peck MRN: [id number] Date of Birth: 02-13-1966  Today's Date: 10/16/2019 OT Individual Time: 1400-1445 OT Individual Time Calculation (min): 45 min  and Today's Date: 10/16/2019 OT Missed Time: 15 Minutes Missed Time Reason: Patient unwilling/refused to participate without medical reason  Short Term Goals: Week 1:  OT Short Term Goal 1 (Week 1): Pt will complete tub shower transfer with consistent min A OT Short Term Goal 2 (Week 1): Pt will complete sit<>stand  with min A OT Short Term Goal 3 (Week 1): Pt will mainatain dynamic standing balance within BADL tasks with min A  Skilled Therapeutic Interventions/Progress Updates:  OT entered room with pt's daughter and husband present. OT asked pt if she wanted to shower this afternoon and pt stated no, that she was going home today. OT inquired about this with family, who stated pt did not want to stay in rehab and family insisting on taking pt home today or tomorrow. OT thoroughly educated on the benefits of staying in rehab and completing the program. Educated on level of intense therapy pt receives here vs the less intense therapy provided by home health. Educated on pt's current level is min/mod A for self care and transfers, with goals of supervision if she stays and completes program. Pt and family are insistent on taking pt home at the level she is now. OT educated on high risk of falls and using gait belt for transfers. Daughter practiced toilet transfer and tub shower transfers in simulated home environment with mod A. Pt with R ankle turning and R knee buckle with OT providing education on R LE positioning. OT provided handouts on R UE positioning and self ROM techniques. Educated on R UE NMR with joint input through wrist and elbow for L UE activation. OT also provided handout on SAEBO e-stim if this is something they would like to pursue.  Reviewed home set-up with pt  already having 3-in-1 BSC, and tub transfer bench. Family reports wc should fit into bathroom. OT placed SAEBO e-stim on wrist extensors. SAEBO left on for 60 minutes. OT returned to remove SAEBO with skin intact and no adverse reactions.  [place] Stim One 330 pulse width 35 Hz pulse rate On 8 sec/ off 8 sec Ramp up/ down 2 sec Symmetrical Biphasic wave form  Max intensity 173m at 500 Ohm load  Pt left seated in wc with alarm belt on, call bell in reach, and needs met. Family present.     Therapy Documentation Precautions:  Precautions Precautions: Fall Precaution Comments: R hemiplegia Restrictions Weight Bearing Restrictions: No General: General OT Amount of Missed Time: 15 Minutes Pain:   denies pain  Therapy/Group: Individual Therapy  EValma Cava3/22/2021, 3:00 PM

## 2019-10-16 NOTE — Progress Notes (Signed)
[place] PHYSICAL MEDICINE & REHABILITATION PROGRESS NOTE   Subjective/Complaints:  Pt asked when she could leave again this morning. Apparently raised some concerns to other staff about nursing (she didn't mention to me)  ROS: Patient denies fever, rash, sore throat, blurred vision, nausea, vomiting, diarrhea, cough, shortness of breath or chest pain, joint or back pain, headache, or mood change.    Objective:   No results found. No results for input(s): WBC, HGB, HCT, PLT in the last 72 hours. No results for input(s): NA, K, CL, CO2, GLUCOSE, BUN, CREATININE, CALCIUM in the last 72 hours.  Intake/Output Summary (Last 24 hours) at 10/16/2019 1203 Last data filed at 10/16/2019 0751 Gross per 24 hour  Intake 480 ml  Output --  Net 480 ml     Physical Exam: Vital Signs Blood pressure 108/66, pulse 94, temperature 98.6 F (37 C), temperature source Oral, resp. rate 18, height 5\' 5"  (1.651 m), weight 68.7 kg, SpO2 99 %.   Constitutional: No distress . Vital signs reviewed. HEENT: EOMI, oral membranes moist Neck: supple Cardiovascular: RRR without murmur. No JVD    Respiratory/Chest: CTA Bilaterally without wheezes or rales. Normal effort    GI/Abdomen: BS +, non-tender, non-distended Ext: no clubbing, cyanosis, or edema Psych: flat, cooperative Skin: No evidence of breakdown, no evidence of rash Neurologic: aphasic, non-fluent Cranial nerves II through XII intact, motor strength is 5/5 in left 0/5 right  deltoid, bicep, tricep, grip,5/5 left , 2- to 2/5 Right  hip flexor, knee extensors, ankle dorsiflexor and plantar flexor  Musculoskeletal: Full range of motion in all 4 extremities. No joint swelling   Assessment/Plan: 1. Functional deficits secondary to L MCA infarct  which require 3+ hours per day of interdisciplinary therapy in a comprehensive inpatient rehab setting.  Physiatrist is providing close team supervision and 24 hour management of active medical problems  listed below.  Physiatrist and rehab team continue to assess barriers to discharge/monitor patient progress toward functional and medical goals  Care Tool:  Bathing    Body parts bathed by patient: Right arm, Chest, Right upper leg, Left upper leg, Front perineal area, Abdomen, Face   Body parts bathed by helper: Left arm, Right lower leg, Left lower leg, Buttocks     Bathing assist Assist Level: Moderate Assistance - Patient 50 - 74%     Upper Body Dressing/Undressing Upper body dressing   What is the patient wearing?: Pull over shirt    Upper body assist Assist Level: Moderate Assistance - Patient 50 - 74%    Lower Body Dressing/Undressing Lower body dressing      What is the patient wearing?: Underwear/pull up, Pants     Lower body assist Assist for lower body dressing: Moderate Assistance - Patient 50 - 74%     Toileting Toileting    Toileting assist Assist for toileting: Moderate Assistance - Patient 50 - 74%     Transfers Chair/bed transfer  Transfers assist     Chair/bed transfer assist level: Moderate Assistance - Patient 50 - 74%     Locomotion Ambulation   Ambulation assist      Assist level: Minimal Assistance - Patient > 75% Assistive device: Walker-rolling Max distance: 100 ft   Walk 10 feet activity   Assist  Walk 10 feet activity did not occur: Safety/medical concerns(R hemi, decreased activity tolerance and R knee buckling)  Assist level: Minimal Assistance - Patient > 75% Assistive device: Walker-rolling   Walk 50 feet activity   Assist Walk  50 feet with 2 turns activity did not occur: Safety/medical concerns(R hemi, decreased activity tolerance and R knee buckling)  Assist level: Minimal Assistance - Patient > 75% Assistive device: Walker-rolling    Walk 150 feet activity   Assist Walk 150 feet activity did not occur: Safety/medical concerns(R hemi, decreased activity tolerance and R knee buckling)  Assist level:  Minimal Assistance - Patient > 75% Assistive device: Walker-rolling    Walk 10 feet on uneven surface  activity   Assist Walk 10 feet on uneven surfaces activity did not occur: Safety/medical concerns(R hemi, decreased activity tolerance and R knee buckling)         Wheelchair     Assist Will patient use wheelchair at discharge?: No(LT goals not set) Type of Wheelchair: Manual    Wheelchair assist level: Maximal Assistance - Patient 25 - 49% Max wheelchair distance: 100 ft    Wheelchair 50 feet with 2 turns activity    Assist        Assist Level: Maximal Assistance - Patient 25 - 49%   Wheelchair 150 feet activity     Assist      Assist Level: Total Assistance - Patient < 25%   Blood pressure 108/66, pulse 94, temperature 98.6 F (37 C), temperature source Oral, resp. rate 18, height 5\' 5"  (1.651 m), weight 68.7 kg, SpO2 99 %.   Medical Problem List and Plan: 1.  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 Will discuss further with rehab team. Discuss with family if needed again also 2.  Antithrombotics: -DVT/anticoagulation: Lovenox 40 mg daily             -antiplatelet therapy: Aspirin 325 mg daily, Brilinta 90 mg twice daily 3. Pain Management: Tylenol as needed 4. Mood: Amantadine 100 mg daily             -antipsychotic agents: N/A             -Denies depressed mood at this time but has flat affect and paucity of speech.  5. Neuropsych: This patient is capable of making decisions on her own behalf. 6. Skin/Wound Care: Routine skin checks 7. Fluids/Electrolytes/Nutrition: Routine in and outs with follow-up chemistries 8.  Orthostasis.  ProAmatine increased to 10mg   tid with improved BP's 9.  Hyperlipidemia.  Lipitor 10. Disposition: Has follow-up with EP cardiology on 11/09/19 to discuss loop recorder placement.      LOS: 5 days A FACE TO FACE EVALUATION WAS PERFORMED  10/16/2019, 12:03 PM

## 2019-10-16 NOTE — Progress Notes (Signed)
Physical Therapy Session Note  Patient Details  Name: Erin Peck MRN: [id number] Date of Birth: 11-03-1965  Today's Date: 10/16/2019 PT Individual Time: 0539-7673 PT Individual Time Calculation (min): 70 min   Short Term Goals: Week 1:  PT Short Term Goal 1 (Week 1): Patient will perform bed mobility with supervision using compensatory strategies as needed. PT Short Term Goal 2 (Week 1): Patient will perform basic transfers with CGA using LRAD. PT Short Term Goal 3 (Week 1): Patient will ambulate >50 ft with min A using LRAD.  Skilled Therapeutic Interventions/Progress Updates:  Pt received in w/c & agreeable to tx. Pt reports she wants to go home, therapist provided listening & encouragement for completing the CIR program, as well as benefits of intensive therapy at this time - team notified of pt's wishes.   Sit<>stand with cuing for hand placement on stable surface and managing RUE on hand orthosis with pt requiring min assist overall. Gait x 5 ft with RW with pt demonstrating R foot inversion and decreased dorsiflexion & heel strike. Attempted to don R PLS AFO but unable to 2/2 shoe design (no removable tongue of shoe) so therapist ace wrapped RLE to promote dorsiflexion & eversion. Gait x 100 ft + 100 ft with RW & min assist with cuing for increased step width RLE, swing through & foot clearance RLE but pt still with some inversion noted.  3" step taps with BUE support on RW & min assist with cuing for more normal BOS vs narrow, with cuing & task focusing on RLE increased hip flexion & coordination with focus on weight shifting L<>R, dynamic balance, and RLE strengthening in single leg stance phase.   Side stepping at rail with LUE support & min assist with cuing for technique and task focusing on RLE hip flexor and abductor/adductor strengthening as well as quad strengthening - pt with decreased quad activation & knee buckling observed in RLE.   At end of session pt left in w/c in  handoff to SLP. Left note in pt's room for family to bring in tennis shoes 1/2 size larger & with tongue not connected to rest of the shoe.    Therapy Documentation Precautions:  Precautions Precautions: Fall Precaution Comments: R hemiplegia Restrictions Weight Bearing Restrictions: No  Pain: Pt denies c/o pain.    Therapy/Group: Individual Therapy  Sandi Mariscal 10/16/2019, 11:21 AM

## 2019-10-16 NOTE — Progress Notes (Signed)
Patient and family requesting to be discharged due to patient wanting to go home. Reviewed with patient and family rationale to complete full rehab program. Reviewed medications patient is currently on while in hospital. Notified PA and SW of request. Reviewed with patient and family conference date scheduled for Tuesday. Continue with plan of care.  Cleotilde Neer

## 2019-10-16 NOTE — Progress Notes (Signed)
Slept good. Calling for assistance. Requires cues to check placement of right foot with transfers. Right inattention, cues to check RUE placement.

## 2019-10-16 NOTE — Progress Notes (Signed)
Occupational Therapy Session Note  Patient Details  Name: Erin Peck MRN: [id number] Date of Birth: 01/28/1966  Today's Date: 10/16/2019 OT Individual Time: 3007-6226 OT Individual Time Calculation (min): 39 min    Short Term Goals: Week 1:  OT Short Term Goal 1 (Week 1): Pt will complete tub shower transfer with consistent min A OT Short Term Goal 2 (Week 1): Pt will complete sit<>stand  with min A OT Short Term Goal 3 (Week 1): Pt will mainatain dynamic standing balance within BADL tasks with min A  Skilled Therapeutic Interventions/Progress Updates:    Upon entering the room, pt seated in wheelchair and having just finished breakfast. Pt with no c/o pain this session. OT assisting pt with figure four position and she was able to leave this type of shoe tied and pull on L shoe herself and needing assistance to get R heel into R shoe this session. OT assisted pt via wheelchair into bathroom for toileting need. Pt very impulsive with transfer and attempting to stand multiple times for transfer while therapist sitting up wheelchair. Pt transferred to toilet with mod A and needed min - mod A standing balance for clothing management and when standing to perform hygiene needs. Pt transferred back into wheelchair in same manner. Pt seated at sink for hygiene needs with hand over hand assistance to utilize R UE into functional tasks. Pt remained seated in wheelchair with chair alarm belt donned and R UE lap tray donned.   Therapy Documentation Precautions:  Precautions Precautions: Fall Precaution Comments: R hemiplegia Restrictions Weight Bearing Restrictions: No   Pain: Pain Assessment Pain Scale: 0-10 Pain Score: 0-No pain Patients Stated Pain Goal: 2 Multiple Pain Sites: No ADL: ADL Eating: Minimal assistance Grooming: Minimal assistance Upper Body Bathing: Moderate assistance Lower Body Bathing: Maximal assistance Upper Body Dressing: Moderate assistance Lower Body  Dressing: Maximal assistance Toileting: Maximal assistance Toilet Transfer: Moderate assistance Tub/Shower Transfer: Moderate assistance   Therapy/Group: Individual Therapy  Alen Bleacher 10/16/2019, 12:49 PM

## 2019-10-16 NOTE — Progress Notes (Signed)
Speech Language Pathology Daily Session Note  Patient Details  Name: Erin Peck MRN: [id number] Date of Birth: 01-08-1966  Today's Date: 10/16/2019 SLP Individual Time: 1115-1200 SLP Individual Time Calculation (min): 45 min  Short Term Goals: Week 1: SLP Short Term Goal 1 (Week 1): Patient will consume current diet with minimal overt s/s of aspiration and overall Mod I for use of swallowing compensatory strategies. SLP Short Term Goal 2 (Week 1): Patient will utilize speech intelligibility strategies at the phrase level to achieve 80% intelligibility with Mod verbal cues. SLP Short Term Goal 3 (Week 1): Patient will demonstrate functional problem solving for basic and famliar tasks with Mod verbal cues. SLP Short Term Goal 4 (Week 1): Patient will attend to right field of enviornment/body with Min verbal cues. SLP Short Term Goal 5 (Week 1): Patient will self-monitor and correct verbal errors at the phrase level with Min verbal cues. SLP Short Term Goal 6 (Week 1): Patient will utilize word-finding strategies at the phrase level with mod verbal cues.  Skilled Therapeutic Interventions: Skilled treatment session focused on cognitive-linguistic goals. SLP facilitated session by providing Max A semantic cues for recall during a novel card task. Mod A verbal cues were also required for use of word-finding strategies for generative naming within the novel card task. Mod-Max A verbal cues were required for emergent awareness in regards to difficulty of task. Patient left upright in wheelchair with alarm on and all needs within reach. Continue with current plan of care.      Pain No/Denies Pain   Therapy/Group: Individual Therapy  Rindi Beechy 10/16/2019, 2:59 PM

## 2019-10-17 ENCOUNTER — Inpatient Hospital Stay (HOSPITAL_COMMUNITY): Payer: Self-pay | Admitting: Physical Therapy

## 2019-10-17 ENCOUNTER — Inpatient Hospital Stay (HOSPITAL_COMMUNITY): Payer: Self-pay | Admitting: Occupational Therapy

## 2019-10-17 MED ORDER — MIDODRINE HCL 5 MG PO TABS: 10.0000 mg | ORAL_TABLET | Freq: Three times a day (TID) | ORAL | 1 refills | Status: DC

## 2019-10-17 MED ORDER — TICAGRELOR 90 MG PO TABS: 90.0000 mg | ORAL_TABLET | Freq: Two times a day (BID) | ORAL | 0 refills | Status: DC

## 2019-10-17 MED ORDER — ATORVASTATIN CALCIUM 80 MG PO TABS: 80.0000 mg | ORAL_TABLET | Freq: Every day | ORAL | 1 refills | Status: DC

## 2019-10-17 MED ORDER — ACETAMINOPHEN 325 MG PO TABS: 650.0000 mg | ORAL_TABLET | ORAL | Status: AC | PRN

## 2019-10-17 NOTE — Progress Notes (Signed)
Physical Therapy Session Note  Patient Details  Name: Erin Peck MRN: [id number] Date of Birth: Jul 20, 1966  Today's Date: 10/17/2019 PT Individual Time: PT Individual Time Calculation (min): 58 min  Time 1032 to 1130  Short Term Goals: Week 1:  PT Short Term Goal 1 (Week 1): Patient will perform bed mobility with supervision using compensatory strategies as needed. PT Short Term Goal 2 (Week 1): Patient will perform basic transfers with CGA using LRAD. PT Short Term Goal 3 (Week 1): Patient will ambulate >50 ft with min A using LRAD.  Skilled Therapeutic Interventions/Progress Updates:     Patient received in [facility] with daughter taking her to the bathroom in [facility]; daughter kept ramming WC up against small tile "ramp" in bathroom and almost throwing patient forwards out of chair despite PT cues for correct WC management, but was able to successfully transfer her to/from commode from [facility] with distant S today. Performed general review of all aspects of functional mobility, able to perform bed mobility with S and sit to stand with min guard/RW, from there able to perform transfers with MInA. Did require ModA to navigate uneven surfaces, and continues to need minA for navigation of single step. Attempted gait but patient with poor safety today, stepping on her L foot with her R, scissoring, and using RW very unsafely with multiple incidents requiring MaxA to maintain balance/prevent a fall; patient then stated "well I didn't have my brace or toe piece" to which this PT reminded her she only got those this morning and has performed much better in prior PT sessions without this equipment, she then stated "well I'm just over all this, I'm tired". She also had multiple incidents while transferring where she stepped on her L foot with her R or left her R foot in a completely in appropriate position when transferring also requiring Mod-MaxA intermittently to correct balance and prevent a fall. Seemed more  impulsive and with less care about her mobility today. Scored 40 on the standing TUG and 25 on the Berg, supporting her high fall risk along with observations made during today's session. Able to self-propel WC 138f using hemi technique. [facility], family educated on poor performance during PT and therapy concerns for fall/safety with their plan to leave early. [facility], family present and all other needs met this morning.   Therapy Documentation Precautions:  Precautions Precautions: Fall Precaution Comments: R hemiplegia Restrictions Weight Bearing Restrictions: No General:   Vital Signs:   Pain: Pain Assessment Pain Scale: 0-10 Pain Score: 0-No pain Faces Pain Scale: No hurt  Balance: Standardized Balance Assessment Standardized Balance Assessment: Berg Balance Test;Timed Up and Go Test Berg Balance Test Sit to Stand: Able to stand  independently using hands Standing Unsupported: Able to stand 2 minutes with supervision Sitting with Back Unsupported but Feet Supported on Floor or Stool: Able to sit 2 minutes under supervision Stand to Sit: Controls descent by using hands Transfers: Able to transfer safely, definite need of hands Standing Unsupported with Eyes Closed: Able to stand 10 seconds with supervision Standing Ubsupported with Feet Together: Needs help to attain position and unable to hold for 15 seconds From Standing, Reach Forward with Outstretched Arm: Reaches forward but needs supervision From Standing Position, Pick up Object from Floor: Able to pick up shoe, needs supervision From Standing Position, Turn to Look Behind Over each Shoulder: Needs supervision  when turning Turn 360 Degrees: Needs assistance while turning Standing Unsupported, Alternately Place Feet on Step/Stool: Able to complete >2 steps/needs minimal assist Standing Unsupported, One Foot in Front: Loses balance  while stepping or standing Standing on One Leg: Tries to lift leg/unable to hold 3 seconds but remains standing independently Total Score: 25 Timed Up and Go Test TUG: Normal TUG Normal TUG (seconds): 40 Exercises:   Other Treatments:      Therapy/Group: Individual Therapy  Windell Norfolk, DPT, PN1   Supplemental Physical Therapist [place]    Pager [phone] Acute Rehab Office [phone]   10/17/2019, 12:12 PM

## 2019-10-17 NOTE — Progress Notes (Signed)
Social Work Patient ID: Erin Peck, female   DOB: Nov 09, 1965, 54 y.o.   MRN: [id number]   SW provided pt dtr with Medicaid forms from financial counselor Yehuda Savannah. SW provided faxed completed forms to [employer].[phone].  SW met with pt, pt husband, and pt dtr in room to discuss d/c to home. When SW arrived, nursing was in the room with the pt. The pt agreed to remain in hospital until able to discuss best plan of care with physician. SW to follow-up after team conference.   [name], MSW, [place] Office: [phone] Cell: [phone] Fax: [phone]

## 2019-10-17 NOTE — Progress Notes (Signed)
[place] PHYSICAL MEDICINE & REHABILITATION PROGRESS NOTE   Subjective/Complaints:  Pt up with PT. Still anxious to get home. Family pushing as well?   ROS: limited due to language/communication   Objective:   No results found. No results for input(s): WBC, HGB, HCT, PLT in the last 72 hours. No results for input(s): NA, K, CL, CO2, GLUCOSE, BUN, CREATININE, CALCIUM in the last 72 hours.  Intake/Output Summary (Last 24 hours) at 10/17/2019 1102 Last data filed at 10/17/2019 0803 Gross per 24 hour  Intake 924 ml  Output --  Net 924 ml     Physical Exam: Vital Signs Blood pressure 125/81, pulse 84, temperature 98.4 F (36.9 C), resp. rate 18, height 5\' 5"  (1.651 m), weight 68.1 kg, SpO2 98 %.   Constitutional: No distress . Vital signs reviewed. HEENT: EOMI, oral membranes moist Neck: supple Cardiovascular: RRR without murmur. No JVD    Respiratory/Chest: CTA Bilaterally without wheezes or rales. Normal effort    GI/Abdomen: BS +, non-tender, non-distended Ext: no clubbing, cyanosis, or edema Psych: flat but cooperative Skin: No evidence of breakdown, no evidence of rash Neurologic: aphasic, non-fluent but communicates in short phrases/sentences Cranial nerves II through XII intact, motor strength is 5/5 in left 0/5 right  deltoid, bicep, tricep, grip,5/5 left , 2- to 2/5 Right  hip flexor, knee extensors, ankle dorsiflexor and plantar flexor  Musculoskeletal: Full range of motion in all 4 extremities. No joint swelling   Assessment/Plan: 1. Functional deficits secondary to L MCA infarct  which require 3+ hours per day of interdisciplinary therapy in a comprehensive inpatient rehab setting.  Physiatrist is providing close team supervision and 24 hour management of active medical problems listed below.  Physiatrist and rehab team continue to assess barriers to discharge/monitor patient progress toward functional and medical goals  Care Tool:  Bathing  Bathing  activity did not occur: Refused Body parts bathed by patient: Right arm, Chest, Right upper leg, Left upper leg, Front perineal area, Abdomen, Face, Left arm, Buttocks, Right lower leg, Left lower leg   Body parts bathed by helper: Left arm, Right lower leg, Left lower leg, Buttocks     Bathing assist Assist Level: Contact Guard/Touching assist     Upper Body Dressing/Undressing Upper body dressing   What is the patient wearing?: Pull over shirt    Upper body assist Assist Level: Supervision/Verbal cueing    Lower Body Dressing/Undressing Lower body dressing    Lower body dressing activity did not occur: Refused What is the patient wearing?: Underwear/pull up, Pants     Lower body assist Assist for lower body dressing: Minimal Assistance - Patient > 75%     Toileting Toileting    Toileting assist Assist for toileting: Minimal Assistance - Patient > 75%     Transfers Chair/bed transfer  Transfers assist     Chair/bed transfer assist level: Minimal Assistance - Patient > 75%     Locomotion Ambulation   Ambulation assist      Assist level: Minimal Assistance - Patient > 75% Assistive device: Walker-rolling Max distance: 100 ft   Walk 10 feet activity   Assist  Walk 10 feet activity did not occur: Safety/medical concerns(R hemi, decreased activity tolerance and R knee buckling)  Assist level: Minimal Assistance - Patient > 75% Assistive device: Walker-rolling   Walk 50 feet activity   Assist Walk 50 feet with 2 turns activity did not occur: Safety/medical concerns(R hemi, decreased activity tolerance and R knee buckling)  Assist level:  Minimal Assistance - Patient > 75% Assistive device: Walker-rolling    Walk 150 feet activity   Assist Walk 150 feet activity did not occur: Safety/medical concerns(R hemi, decreased activity tolerance and R knee buckling)  Assist level: Minimal Assistance - Patient > 75% Assistive device: Walker-rolling     Walk 10 feet on uneven surface  activity   Assist Walk 10 feet on uneven surfaces activity did not occur: Safety/medical concerns(R hemi, decreased activity tolerance and R knee buckling)         Wheelchair     Assist Will patient use wheelchair at discharge?: No(LT goals not set) Type of Wheelchair: Manual    Wheelchair assist level: Maximal Assistance - Patient 25 - 49% Max wheelchair distance: 100 ft    Wheelchair 50 feet with 2 turns activity    Assist        Assist Level: Maximal Assistance - Patient 25 - 49%   Wheelchair 150 feet activity     Assist      Assist Level: Total Assistance - Patient < 25%   Blood pressure 125/81, pulse 84, temperature 98.4 F (36.9 C), resp. rate 18, height 5\' 5"  (1.651 m), weight 68.1 kg, SpO2 98 %.   Medical Problem List and Plan: 1.  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 Anticipated goals will not be reached. Pt advised what things she will miss out on by leaving program early.   -will try to measure her for AFO prior to discharge. Will need to be delivered as outpt. 2.  Antithrombotics: -DVT/anticoagulation: Lovenox 40 mg daily--d/c             -antiplatelet therapy: Aspirin 325 mg daily, Brilinta 90 mg twice daily 3. Pain Management: Tylenol as needed 4. Mood: Amantadine 100 mg daily             -antipsychotic agents: N/A             -I suspect she's depressed although she's denied. "just want to go home"  -will not send home on amantadine although if she has a supply of it, she may continue (uninsured) 5. Neuropsych: This patient is capable of making decisions on her own behalf. 6. Skin/Wound Care: Routine skin checks 7. Fluids/Electrolytes/Nutrition: encourage balanced diet 8.  Orthostasis.   ProAmatine increased to 10mg  tid with improved BP's  -bp's this morning were after using toilet. Not sure they're indicative of her typical state  -continue same midodrine dose 9.  Hyperlipidemia.  Lipitor 10. Disposition: Has follow-up with EP cardiology on 11/09/19 to discuss loop recorder placement.      LOS: 6 days A FACE TO FACE EVALUATION WAS PERFORMED  10/17/2019, 11:02 AM

## 2019-10-17 NOTE — Progress Notes (Addendum)
Occupational Therapy Discharge Summary  Patient Details  Name: Erin Peck MRN: [id number] Date of Birth: 16-Feb-1966   Patient has met 2 of 14 long term goals due to pt dc per pt and family request and not completing full rehabilitation program advised by therapists. Patient to discharge at overall Min Assist/mod A level.  Patient's care partner is independent to provide the necessary physical and cognitive assistance at discharge.    Reasons goals not met: Pt and family insisting on going home and not finishing rehabilitation program despite encouragement and recommendation from therapy team. Therefore, pt did not meet most of her occupational therapy goals.  Recommendation:  Patient will benefit from ongoing skilled OT services in home health setting to continue to advance functional skills in the area of BADL and functional use of R UE.  Equipment: No equipment provided pt already has tub bench and 3-in-1 BSC  Reasons for discharge: discharge from hospital; pt elected to leave early despite not meeting goals and encouraged to stay from therapy team.  Patient/family agrees with progress made and goals achieved: Yes  OT Discharge Precautions/Restrictions  Precautions Precautions: Fall Precaution Comments: R hemiplegia Restrictions Weight Bearing Restrictions: No Pain Pain Assessment Pain Scale: 0-10 Pain Score: 0-No pain Faces Pain Scale: No hurt ADL ADL Eating: Set up Grooming: Setup Upper Body Bathing: Minimal assistance Lower Body Bathing: Minimal assistance Upper Body Dressing: Minimal assistance Lower Body Dressing: Minimal assistance Toileting: Minimal assistance Toilet Transfer: Minimal assistance Tub/Shower Transfer: Moderate assistance Perception  Perception: Impaired Inattention/Neglect: Does not attend to right side of body(mild impairment) Praxis Praxis: Intact Cognition Overall Cognitive Status: Impaired/Different from baseline Arousal/Alertness:  Awake/alert Orientation Level: Oriented X4 Memory: Impaired Memory Impairment: Decreased recall of new information;Decreased short term memory Awareness: Impaired Awareness Impairment: Emergent impairment Problem Solving: Impaired Problem Solving Impairment: Functional basic Behaviors: Impulsive Safety/Judgment: Impaired Comments: Patient is slightly impulsive, however, follows cues if given Sensation Sensation Light Touch: Impaired Detail Light Touch Impaired Details: Impaired RUE;Impaired RLE Proprioception: Impaired by gross assessment Coordination Gross Motor Movements are Fluid and Coordinated: No Fine Motor Movements are Fluid and Coordinated: No Coordination and Movement Description: R hemiplegia Motor  Motor Motor: Hemiplegia;Abnormal postural alignment and control Motor - Skilled Clinical Observations: R hemi Motor - Discharge Observations: R hemiaresis, generalized deconditioning Mobility  Bed Mobility Bed Mobility: Rolling Right;Rolling Left;Supine to Sit;Sit to Supine Rolling Right: Supervision/verbal cueing Rolling Left: Supervision/Verbal cueing Supine to Sit: Supervision/Verbal cueing Sit to Supine: Supervision/Verbal cueing Transfers Sit to Stand: Minimal Assistance - Patient > 75% Stand to Sit: Minimal Assistance - Patient > 75%  Trunk/Postural Assessment  Postural Control Postural Control: Deficits on evaluation Righting Reactions: delayed Protective Responses: delayed  Balance Standardized Balance Assessment Static Sitting Balance Static Sitting - Level of Assistance: 5: Stand by assistance Dynamic Sitting Balance Dynamic Sitting - Balance Support: During functional activity Dynamic Sitting - Level of Assistance: 5: Stand by assistance Static Standing Balance Static Standing - Balance Support: During functional activity Static Standing - Level of Assistance: 4: Min assist Dynamic Standing Balance Dynamic Standing - Balance Support: During  functional activity Dynamic Standing - Level of Assistance: 4: Min assist Extremity/Trunk Assessment RUE Assessment RUE Assessment: Exceptions to [facility] Passive Range of Motion (PROM) Comments: prom wfl RUE Body System: Neuro Brunstrum levels for arm and hand: Hand;Arm Brunstrum level for arm: Stage I Presynergy Brunstrum level for hand: Stage I Flaccidity LUE Assessment LUE Assessment: Within Functional Limits   Daneen Schick Najla Aughenbaugh 10/17/2019, 12:57 PM

## 2019-10-17 NOTE — Progress Notes (Signed)
Pt discharged home with family. Discharge instructions given by Jesusita Oka, PA. All questions answered. No further questions from family or pt. Belongings sent with pt. Pt stable at time of discharge.   Marylu Lund, RN

## 2019-10-17 NOTE — Patient Care Conference (Signed)
Inpatient RehabilitationTeam Conference and Plan of Care Update Date: 10/17/2019   Time: 10:50 AM    Patient Name: Erin Peck      Medical Record Number: [id number]  Date of Birth: 05/21/1966 Sex: Female         Room/Bed: 8S50N/3Z76B-34 Payor Info: Payor: /    Admit Date/Time:  10/11/2019  8:57 PM  Primary Diagnosis:  Left middle cerebral artery stroke [facility])  Patient Active Problem List   Diagnosis Date Noted  . Left middle cerebral artery stroke ([place]) 10/11/2019  . Aortic atherosclerosis ([place]) 10/10/2019  . CVA (cerebral vascular accident) ([place]) 10/09/2019  . Hypotension 10/06/2019  . Accelerated hypertension 10/06/2019  . Hyperlipidemia 10/06/2019  . 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 Hypoglycemia 10/06/2019  . Family hx-stroke 10/06/2019  . Complicated migraine 19/37/9024  . Acute blood loss anemia 10/06/2019  . Hypokalemia 10/06/2019  . Stroke due to occlusion of left middle cerebral artery ([place]) 10/03/2019  . Acute ischemic stroke ([place]) 10/02/2019    Expected Discharge Date: Expected Discharge Date: 10/17/19  Team Members Present: Physician leading conference: Dr. Alger Simons Care Coodinator Present: [name], LCSWA;Genie Moustafa Mossa, RN, MSN Nurse Present: Mohammed Kindle, RN PT Present: Lavone Nian, PT OT Present: Cherylynn Ridges, OT SLP Present: Weston Anna, SLP PPS Coordinator present : Ileana Ladd, Burna Mortimer, SLP     Current Status/Progress Goal Weekly Team Focus  Bowel/Bladder   Continent of bowel & bladder  remain continent      Swallow/Nutrition/ Hydration   Regular textures with thin liquids, intermittent supervision  Mod I  use of swallowing compensatory strategies, diet tolerance   ADL's   Min/Mod A overall  Supervision  R NMR, NMES, transfers, self-care retraining   Mobility   min assist gait x 100 ft with RW, min assist transfers  CGA stairs, supervision transfers & gait  R NMR, balance,  strengthening, pt education, stair negotiation, gait   Communication   Min A  Supervision  use of speech intelligibility strategies and word-finding strategies   Safety/Cognition/ Behavioral Observations  can be a little impulsive when in [facility] alarm belt in place  Supervision  basic problem solving, awareness   Pain   without complaint of pain  remain pain free      Skin   remain free from breakdown  remain free from breakdown rest of hospitalization       Rehab Goals Patient on target to meet rehab goals: Yes *See Care Plan and progress notes for long and short-term goals.     Barriers to Discharge  Current Status/Progress Possible Resolutions Date Resolved   Nursing                  PT                    OT                  SLP                SW Other (comments) Pt is uninsured. POssible issues with medications at d/c as pt was recently d/c under [facility] program (within last week).            Discharge Planning/Teaching Needs:  Pt to d/c to home with support from her husband and dtr who will provide care; and other family members  Family education as recommended by therapy   Team Discussion: MD increased weakness at home, new infarct, R hemi, aphasic, anxious.  RN cont B/B, Dtr with questions.  OT fam ed yesterday, S goals.  PT wants to go home, min a, high fall risk.  SLP aphasic, word finding deficits, dysarthria, cog impaired, memory poor.  Has husband and Dtr to help at home.   Revisions to Treatment Plan: N/A     Medical Summary Current Status: acute on subacute left MCA infarct with right hemiparesis, aphasia, labile bps/orthostasis Weekly Focus/Goal: bp, cv/neuro considerations, anxiety  Barriers to Discharge: Behavior       Continued Need for Acute Rehabilitation Level of Care: The patient requires daily medical management by a physician with specialized training in physical medicine and rehabilitation for the following reasons: Direction of a multidisciplinary  physical rehabilitation program to maximize functional independence : Yes Medical management of patient stability for increased activity during participation in an intensive rehabilitation regime.: Yes Analysis of laboratory values and/or radiology reports with any subsequent need for medication adjustment and/or medical intervention. : Yes   I attest that I was present, lead the team conference, and concur with the assessment and plan of the team.   Lelon Frohlich M 10/17/2019, 3:10 PM   Team conference was held via web/ teleconference due to COVID - 19

## 2019-10-17 NOTE — Progress Notes (Addendum)
Occupational Therapy Session Note  Patient Details  Name: Erin Peck MRN: [id number] Date of Birth: 14-Sep-1965  Today's Date: 10/17/2019 OT Individual Time: 3557-3220 OT Individual Time Calculation (min): 57 min   Short Term Goals: Week 1:  OT Short Term Goal 1 (Week 1): Pt will complete tub shower transfer with consistent min A OT Short Term Goal 2 (Week 1): Pt will complete sit<>stand  with min A OT Short Term Goal 3 (Week 1): Pt will mainatain dynamic standing balance within BADL tasks with min A  Skilled Therapeutic Interventions/Progress Updates:    Pt greeted sitting in wc and agreeable to OT treatment session. Pt agreeable to shower today.Pt brought into bathroom and needed min A to doff clothes.  Min A stand-pivot onto tub bench. Bathing completed with min A and OT providing hand over hand A to integrate R UE into bathing tasks for neuro re-ed. OT educated on hemi dressing strategies with pt demonstrating good carryover and min/CGA overall. Pt needed min A for sit<>stands and min A for dynamic balance with occasional R knee buckle. Pt dried hair with set-up A. R UE NMR with standing weight bearing towel pushes on high low table. Activation of pectorals, some scap protraction/retracion as well. OT placed SAEBO e-stim on wrist extensors. SAEBO left on for 60 minutes. OT returned to remove SAEBO with skin intact and no adverse reactions.  Sa[place]o Stim One 330 pulse width 35 Hz pulse rate On 8 sec/ off 8 sec Ramp up/ down 2 sec Symmetrical Biphasic wave form  Max intensity at 500 Ohm load   Therapy Documentation Precautions:  Precautions Precautions: Fall Precaution Comments: R hemiplegia Restrictions Weight Bearing Restrictions: No Pain: Pain Assessment Pain Scale: 0-10 Pain Score: 0-No pain   Therapy/Group: Individual Therapy  Mal Amabile 10/17/2019, 10:24 AM

## 2019-10-17 NOTE — Progress Notes (Signed)
Physical Therapy Session Note  Patient Details  Name: Erin Peck MRN: [id number] Date of Birth: Mar 10, 1966  Today's Date: 10/17/2019 PT Individual Time: 4098-1191 PT Individual Time Calculation (min): 70 min   Short Term Goals: Week 1:  PT Short Term Goal 1 (Week 1): Patient will perform bed mobility with supervision using compensatory strategies as needed. PT Short Term Goal 2 (Week 1): Patient will perform basic transfers with CGA using LRAD. PT Short Term Goal 3 (Week 1): Patient will ambulate >50 ft with min A using LRAD.  Skilled Therapeutic Interventions/Progress Updates:  Pt received in w/c & agreeable to tx. Pt continues to report she wants to go home - MD made aware. Pt with Vans tennis shoes with removable tongue so in gym pt trialed PLS AFO, one with medial strut then one with lateral strut. Pt with more inversion with AFO with lateral strut that's more corrected with medial strut but will defer to orthotist re: best AFO to address pt's RLE deficits. Pt would benefit from toe cap to increase ease of RLE foot advancement during swing phase but due to pt's urgent desire to go home, usure that orthotist will have time to apply toe cap to pt's shoe. Pt continues to require cuing for safe hand placement and management of RUE on hand orthosis. Gait training 100 ft + 100 ft with RW, R hand orthosis, and R AFO with min assist with decreased foot clearance and occasional cuing for swing through RLE for safety with turns. Pt has small single steps to enter home. Pt negotiates 3" step x 2 trials with RW & min assist with cuing for compensatory pattern & assistance with balance 2/2 R sided weakness & slight R lateral lean. Therapist typed up safety recommendations & instructions for pt to provide to pt's family as no family present for session for caregiver training - paper copy given to pt & one put in pt's paper chart. Chris Science writer) then arrives for AFO consult & pt trials R PLS AFO  with more posterior/lateral strut that is more rigid overall so pt demonstrates more improved foot clearance during gait. Thayer Ohm also recommends toe cap & plans to place one on pt's R shoe today. At end of session pt left in w/c with chair alarm donned, call bell in reach.  Therapy Documentation Precautions:  Precautions Precautions: Fall Precaution Comments: R hemiplegia Restrictions Weight Bearing Restrictions: No  Pain: Pt denies c/o pain.   Therapy/Group: Individual Therapy  Sandi Mariscal 10/17/2019, 9:34 AM

## 2019-10-17 NOTE — Progress Notes (Signed)
Social Work Patient ID: Erin Peck, female   DOB: 05-17-1966, 54 y.o.   MRN: [id number]   Per medical team, pt would like to discharge today. Attending will approve pt d/c at pt request. Reports pt required 3 primary medications needed at d/c: brilinta, liptor, and midodrine. SW to follow-up with [facility] program to see if pt will be eligible for medications.   SW left message for [name]/[facility] [phone]) to ask if pt can be accepted for charity care HHPT/OT/ST. SW waiting on follow-up.   SW received updates from [name] with [facility] reporting pt will have [place] for meds at time of d/c.   SW met with pt, pt dtr, and pt husband in room to discuss discharge needs for today.  SW did provide them with appt card for new pt appt with: [facility] and [facility] for Wednesday, April 14 at 2:30pm. Pt is aware to follow-up about financial assistance at appt. SW provided [name] letter. SW informed there will be updates once there is more informaiton on DME and HHA.  SW spoke with [name]/[facility] who will submit pt information to branch and follow-up with SW.  *Referral approved by [facility] [place] [phone]  SW made efforts to give pt husband updates [phone]) but vm full. SW spoke with pt dtr [name] [phone]) to update on DME and HHA. She reports the w/c to be delivered to her home tomorrow. SW provided name/contact information for HHA.  [name], MSW, [place] Office: [phone] Cell: [phone] Fax: [phone]

## 2019-10-17 NOTE — Progress Notes (Signed)
Orthopedic Tech Progress Note Patient Details:  ILIANI VEJAR 03-09-1966 [id number] Called in order to [facility] for an AFO  Patient ID: KALIMAH CAPURRO, female   DOB: Jul 19, 1966, 54 y.o.   MRN: [id number]   [name] 10/17/2019, 8:44 AM

## 2019-10-17 NOTE — Progress Notes (Signed)
Physical Therapy Discharge Summary  Patient Details  Name: Erin Peck MRN: [id number] Date of Birth: Apr 26, 1966  Today's Date: 10/17/2019    Patient has met 0 of 11 long term goals due to pt electing to d/c early & not completing CIR program.  Patient to discharge at min assist for gait & single step negotiation with RW, min assist for transfers.   Patient's family participated in some hands on training with another PT and with OT but not with this specific PT. Typed recommendations were given to pt to provide to pt's family.  Reasons goals not met: pt did not complete the program, as she wanted to d/c home prior to completing recommended program.  Recommendation:  Patient will benefit from ongoing skilled PT services in home health setting to continue to advance safe functional mobility, address ongoing impairments in R NMR, impaired memory & cognition & safety awareness, transfers, gait, balance, strengthening, endurance, stair negotiation, and minimize fall risk.  Equipment: R hand orthosis, R AFO & toe cap, w/c  Reasons for discharge: discharge from hospital  Patient/family agrees with progress made and goals achieved: Yes  PT Discharge Precautions/Restrictions Precautions Precautions: Fall Precaution Comments: R hemiplegia Restrictions Weight Bearing Restrictions: No  Vision/Perception  No visual deficits at baseline. No changes in baseline.  Decreased attention to R side of body.   Cognition Overall Cognitive Status: Impaired/Different from baseline Arousal/Alertness: Awake/alert Orientation Level: Oriented X4 Memory: Impaired Memory Impairment: Decreased recall of new information;Decreased short term memory Awareness: Impaired Awareness Impairment: Emergent impairment Problem Solving: Impaired Problem Solving Impairment: Functional basic Behaviors: Impulsive Safety/Judgment: Impaired Comments: Patient is slightly impulsive, however, follows cues if given    Sensation Sensation Light Touch: Impaired Detail Light Touch Impaired Details: Impaired RUE;Impaired RLE Proprioception: Impaired by gross assessment Coordination Gross Motor Movements are Fluid and Coordinated: No Fine Motor Movements are Fluid and Coordinated: No Coordination and Movement Description: R hemiplegia   Motor  Motor Motor: Hemiplegia;Abnormal postural alignment and control Motor - Discharge Observations: R hemiaresis, generalized deconditioning   Mobility Bed Mobility Bed Mobility: Rolling Right;Rolling Left;Supine to Sit;Sit to Supine Rolling Right: Supervision/verbal cueing Rolling Left: Supervision/Verbal cueing Supine to Sit: Supervision/Verbal cueing Sit to Supine: Supervision/Verbal cueing Transfers Transfers: Sit to Stand;Stand to Sit;Stand Pivot Transfers Sit to Stand: Minimal Assistance - Patient > 75% Stand to Sit: Minimal Assistance - Patient > 75% Stand Pivot Transfers: Minimal Assistance - Patient > 75% Stand Pivot Transfer Details: Verbal cues for technique;Verbal cues for precautions/safety   Locomotion  Gait Ambulation: Yes Gait Assistance: Minimal Assistance - Patient > 75% Gait Distance (Feet): 100 Feet Assistive device: Rolling walker(R hand orthosis, R AFO) Gait Assistance Details: Verbal cues for technique Gait Gait: Yes Gait Pattern: Impaired Gait Pattern: Decreased dorsiflexion - right;Decreased hip/knee flexion - right;Decreased stride length;Decreased stance time - right Gait velocity: decreased Stairs / Additional Locomotion Curb: Minimal Assistance - Patient >75%(with RW & heavy cuing for sequencing) Wheelchair Mobility Wheelchair Mobility: Yes Wheelchair Assistance: Supervision/Verbal cueing Wheelchair Propulsion: Left upper extremity;Left lower extremity Wheelchair Parts Management: Needs assistance Distance: 150 ft   Trunk/Postural Assessment  Postural Control Postural Control: Deficits on evaluation Righting  Reactions: delayed Protective Responses: delayed   Balance Standardized Balance Assessment Standardized Balance Assessment: Berg Balance Test;Timed Up and Go Test  Berg Balance Test Sit to Stand: Able to stand  independently using hands Standing Unsupported: Able to stand 2 minutes with supervision Sitting with Back Unsupported but Feet Supported on Floor or Stool: Able to sit 2  minutes under supervision Stand to Sit: Controls descent by using hands Transfers: Able to transfer safely, definite need of hands Standing Unsupported with Eyes Closed: Able to stand 10 seconds with supervision Standing Ubsupported with Feet Together: Needs help to attain position and unable to hold for 15 seconds From Standing, Reach Forward with Outstretched Arm: Reaches forward but needs supervision From Standing Position, Pick up Object from Floor: Able to pick up shoe, needs supervision From Standing Position, Turn to Look Behind Over each Shoulder: Needs supervision when turning Turn 360 Degrees: Needs assistance while turning Standing Unsupported, Alternately Place Feet on Step/Stool: Able to complete >2 steps/needs minimal assist Standing Unsupported, One Foot in Front: Loses balance while stepping or standing Standing on One Leg: Tries to lift leg/unable to hold 3 seconds but remains standing independently Total Score: 25  Timed Up and Go Test TUG: Normal TUG Normal TUG (seconds): 40   Extremity Assessment  Per OT assessment: RUE Assessment RUE Assessment: Exceptions to [facility] Passive Range of Motion (PROM) Comments: prom wfl RUE Body System: Neuro Brunstrum levels for arm and hand: Hand;Arm Brunstrum level for arm: Stage I Presynergy Brunstrum level for hand: Stage I Flaccidity   LUE Assessment LUE Assessment: Within Functional Limits   RLE Assessment RLE Assessment: Exceptions to [facility] RLE Strength Right Hip Flexion: 2-/5 Right Knee Flexion: 3+/5 Right Ankle Dorsiflexion: 1/5 LLE  Assessment LLE Assessment: Within Functional Limits    Waunita Schooner 10/17/2019, 1:06 PM

## 2019-10-17 NOTE — Plan of Care (Signed)
Problem: RH Balance Goal: LTG Patient will maintain dynamic standing with ADLs (OT) Description: LTG:  Patient will maintain dynamic standing balance with assist during activities of daily living (OT)  10/17/2019 1237 by Faryal Marxen, Daneen Schick, OT Outcome: Not Met (add Reason) Note: Pt and family insisting on going home and not finishing rehabilitation program, therefore pt did not meet goal 10/17/2019 1231 by Judythe Postema, Daneen Schick, OT Outcome: Not Met (add Reason) Note: Pt and family insisting on going home and not finishing rehabilitation program, therefore pt did not meet goal   Problem: Sit to Stand Goal: LTG:  Patient will perform sit to stand in prep for activites of daily living with assistance level (OT) Description: LTG:  Patient will perform sit to stand in prep for activites of daily living with assistance level (OT) Outcome: Not Met (add Reason) Note: Pt and family insisting on going home and not finishing rehabilitation program, therefore pt did not meet goal   Problem: RH Grooming Goal: LTG Patient will perform grooming w/assist,cues/equip (OT) Description: LTG: Patient will perform grooming with assist, with/without cues using equipment (OT) Outcome: Not Met (add Reason) Note: Pt and family insisting on going home and not finishing rehabilitation program, therefore pt did not meet goal   Problem: RH Bathing Goal: LTG Patient will bathe all body parts with assist levels (OT) Description: LTG: Patient will bathe all body parts with assist levels (OT) Outcome: Not Met (add Reason) Note: Pt and family insisting on going home and not finishing rehabilitation program, therefore pt did not meet goal   Problem: RH Dressing Goal: LTG Patient will perform upper body dressing (OT) Description: LTG Patient will perform upper body dressing with assist, with/without cues (OT). Outcome: Not Met (add Reason) Note: Pt and family insisting on going home and not finishing rehabilitation program,  therefore pt did not meet goal Goal: LTG Patient will perform lower body dressing w/assist (OT) Description: LTG: Patient will perform lower body dressing with assist, with/without cues in positioning using equipment (OT) Outcome: Not Met (add Reason) Note: Pt and family insisting on going home and not finishing rehabilitation program, therefore pt did not meet goal   Problem: RH Toileting Goal: LTG Patient will perform toileting task (3/3 steps) with assistance level (OT) Description: LTG: Patient will perform toileting task (3/3 steps) with assistance level (OT)  Outcome: Not Met (add Reason) Note: Pt and family insisting on going home and not finishing rehabilitation program, therefore pt did not meet goal   Problem: RH Functional Use of Upper Extremity Goal: LTG Patient will use RT/LT upper extremity as a (OT) Description: LTG: Patient will use right/left upper extremity as a stabilizer/gross assist/diminished/nondominant/dominant level with assist, with/without cues during functional activity (OT) Outcome: Not Met (add Reason) Note: Pt and family insisting on going home and not finishing rehabilitation program, therefore pt did not meet goal   Problem: RH Toilet Transfers Goal: LTG Patient will perform toilet transfers w/assist (OT) Description: LTG: Patient will perform toilet transfers with assist, with/without cues using equipment (OT) Outcome: Not Met (add Reason) Note: Pt and family insisting on going home and not finishing rehabilitation program, therefore pt did not meet goal   Problem: RH Tub/Shower Transfers Goal: LTG Patient will perform tub/shower transfers w/assist (OT) Description: LTG: Patient will perform tub/shower transfers with assist, with/without cues using equipment (OT) Outcome: Not Met (add Reason) Note: Pt and family insisting on going home and not finishing rehabilitation program, therefore pt did not meet goal   Problem: RH  Awareness Goal: LTG: Patient  will demonstrate awareness during functional activites type of (OT) Description: LTG: Patient will demonstrate awareness during functional activites type of (OT) Outcome: Not Met (add Reason) Note: Pt and family insisting on going home and not finishing rehabilitation program, therefore pt did not meet goal   Problem: RH Furniture Transfers Goal: LTG Patient will perform furniture transfers w/assist (OT/PT) Description: LTG: Patient will perform furniture transfers  with assistance (OT/PT). Outcome: Not Met (add Reason)

## 2019-10-17 NOTE — Progress Notes (Signed)
Social Work Discharge Note   The overall goal for the admission was met for:   Discharge location: Yes. D/c to home with her husband and dtr. Pt to have support from various family members.   Length of Stay: Yes. 6 days.   Discharge activity level: Yes. Mid Asst  Home/community participation: Yes. Limited  Services provided included: MD, RD, PT, OT, SLP, RN, CM, TR, Pharmacy, Neuropsych and SW  Financial Services: Other: Uninsured. Medicaid application in process  Follow-up services arranged: Home Health: [facility] for PT/OT and DME: [facility] for wheelchair  Comments (or additional information): contact pt dtr [name] [phone]  Patient/Family verbalized understanding of follow-up arrangements: Yes  Individual responsible for coordination of the follow-up plan: Pt will have assistance from dtr and husband to coordinate care needs.    Confirmed correct DME delivered: Rana Snare 10/17/2019    Rana Snare

## 2019-10-17 NOTE — Progress Notes (Signed)
Orthopedic Tech Progress Note Patient Details:  Erin Peck [birth year] [id number] Called in an order this morning to [facility] for an AFO. then just recently routed over as well called back to let [facility] know that the MD just released an order for an AFO CONSULT. Patient ID: Erin Peck, female   DOB: 19-Oct-1965, 54 y.o.   MRN: [id number]   [name] 10/17/2019, 10:05 AM

## 2019-10-17 NOTE — Discharge Summary (Signed)
Physician Discharge Summary  Patient ID: Erin Peck MRN: [id number] DOB/AGE: [birth year] 54 y.o.  Admit date: 10/11/2019 Discharge date: 10/17/2019  Discharge Diagnoses:  Principal Problem:   Left middle cerebral artery stroke [facility]) DVT prophylaxis Orthostasis Hyperlipidemia Mood stabilization  Discharged Condition:  Significant Diagnostic Studies: CT ABDOMEN PELVIS W WO CONTRAST  Result Date: 10/07/2019 CLINICAL DATA:  Hematuria, rule out urinary leak EXAM: CT ABDOMEN AND PELVIS WITHOUT AND WITH CONTRAST TECHNIQUE: Multidetector CT imaging of the abdomen and pelvis was performed following the standard protocol before and following the bolus administration of intravenous contrast. CONTRAST:  OMNIPAQUE IOHEXOL 300 MG/ML  SOLN COMPARISON:  Abdominal radiographs, 10/03/2019 FINDINGS: Lower chest: Small right pleural effusion Hepatobiliary: No solid liver abnormality is seen. Excreted contrast in the gallbladder. No gallstones, gallbladder wall thickening, or biliary dilatation. Pancreas: Unremarkable. No pancreatic ductal dilatation or surrounding inflammatory changes. Spleen: Normal in size without significant abnormality. Adrenals/Urinary Tract: Benign, fat containing left adrenal adenoma. There is slightly asymmetric fat and fluid stranding about the right kidney, however there is no evidence of urinary tract calculus or hydronephrosis. There is no evidence of contrast leak on delayed phase imaging. Bladder is unremarkable. Stomach/Bowel: Stomach is within normal limits. Normal appendix. No evidence of bowel wall thickening, distention, or inflammatory changes. Vascular/Lymphatic: Aortic atherosclerosis. Stranding about the right inguinal vessels and overlying soft tissues, likely related to prior vascular access. No enlarged abdominal or pelvic lymph nodes. Reproductive: No mass or other significant abnormality. Other: No abdominal wall hernia or abnormality. No abdominopelvic ascites.  Musculoskeletal: No acute or significant osseous findings. IMPRESSION: 1. Slightly asymmetric fat and fluid stranding about the right kidney, however there is no evidence of urinary tract calculus or hydronephrosis. There is no evidence of contrast leak on delayed phase imaging. Leak suspected on prior radiographs may be resolved in the interval. 2. Small right pleural effusion. 3. Aortic atherosclerosis (ICD10-I70.0). Electronically Signed   By: Lauralyn Primes M.D.   On: 10/07/2019 15:34   CT Code Stroke CTA Head W/WO contrast  Result Date: 10/09/2019 CLINICAL DATA:  Stroke symptoms. EXAM: CT ANGIOGRAPHY HEAD AND NECK CT PERFUSION BRAIN TECHNIQUE: Multidetector CT imaging of the head and neck was performed using the standard protocol during bolus administration of intravenous contrast. Multiplanar CT image reconstructions and MIPs were obtained to evaluate the vascular anatomy. Carotid stenosis measurements (when applicable) are obtained utilizing NASCET criteria, using the distal internal carotid diameter as the denominator. Multiphase CT imaging of the brain was performed following IV bolus contrast injection. Subsequent parametric perfusion maps were calculated using RAPID software. CONTRAST:  75mL OMNIPAQUE IOHEXOL 350 MG/ML SOLN; 40mL OMNIPAQUE IOHEXOL 350 MG/ML SOLN COMPARISON:  CTA from 5 days ago FINDINGS: CTA NECK FINDINGS Aortic arch: Atherosclerotic plaque.  Three vessel branching. Right carotid system: Calcified plaque at the ICA bulb without flow limiting stenosis or ulceration. Left carotid system: Mild calcified plaque at the bulb without flow limiting stenosis or ulceration. Vertebral arteries: No proximal subclavian stenosis. There is calcified plaque at both vertebral origins with chronic occlusion on the left. Robust flow in the dominant right vertebral artery. There is reconstitution of the left vertebral artery at the V3 segment. Skeleton: Negative Other neck: Negative Upper chest:  Centrilobular emphysema. Trace pleural fluid on the right. Review of the MIP images confirms the above findings CTA HEAD FINDINGS Anterior circulation: Y stent in the left MCA. The upper limb showed in stent thrombosis on prior with poor subsequent the flow, unchanged. The lower limb is  newly affected with the same appearance and new poor downstream flow. No contralateral embolus is seen. Posterior circulation: Strong dominance of the right vertebral artery. The vertebrobasilar arteries are smooth and widely patent. No branch occlusion or aneurysm Venous sinuses: Patent Anatomic variants: Negative Review of the MIP images confirms the above findings Case discussed with Dr. Amada Jupiter while in progress. CT Brain Perfusion Findings: CBF (<30%) Volume: 0mL Perfusion (Tmax>6.0s) volume: 20mL IMPRESSION: 1. Recent Y stenting of the left M1 and proximal M2 vessels. There is new in stent narrowing/thrombosis of the lower limb with downstream underfilling. In stent thrombosis of the upper limb persists. 2. Underestimated core infarct with pseudonormalization at the level of the subacute left basal ganglia infarcts and the interval left temporal lobe infarct. There is 20 cc of ischemic range perfusion in the left temporal lobe and deep white matter which is more extensive than the noncontrast CT abnormality, probable true penumbra. 3. No new site of embolic disease. 4. 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 Electronically Signed   By: Marnee Spring M.D.   On: 10/09/2019 10:20   CT ANGIO HEAD W OR WO CONTRAST  Result Date: 10/04/2019 CLINICAL DATA:  Stroke follow-up EXAM: CT ANGIOGRAPHY HEAD TECHNIQUE: Multidetector CT imaging of the head was performed using the standard protocol during bolus administration of intravenous contrast. Multiplanar CT image reconstructions and MIPs were obtained to evaluate the vascular anatomy. CONTRAST:  75mL OMNIPAQUE IOHEXOL 350 MG/ML  SOLN COMPARISON:  Brain MRI from yesterday FINDINGS: CT HEAD Brain: Cytotoxic edema from infarct in the left basal ganglia and adjacent white matter, unchanged in extent from prior brain MRI. There is also pre-existing lacunar infarct in the left frontal white matter. No hemorrhagic conversion or midline shift. Vascular: See below Skull: Negative Sinuses: Patchy chronic appearing sinusitis Orbits: Negative CTA HEAD Anterior circulation: The siphons are symmetrically patent. Y stent at the left M1 to M2 segments with nonocclusive thrombus seen in the lumen of the upper limb. The lower limb is well enhancing. Limited visualization at the margins of the stent due to artifact from radiopaque markers. Hypoplastic right A1 segment. Negative for aneurysm or right-sided embolic disease Posterior circulation: Right dominant vertebral artery. Basilar is smooth and widely patent. No branch occlusion or beading. Negative for aneurysm. Venous sinuses: Diffusely patent Anatomic variants: None significant Call has been placed to the Neurology team. IMPRESSION: 1. Y stenting of the left M1 and M2 segments with nonocclusive in stent thrombus narrowing the upper limb. 2. Acute left MCA territory infarct is unchanged from brain MRI yesterday. Electronically Signed   By: Marnee Spring M.D.   On: 10/04/2019 08:21   CT ANGIO HEAD W OR WO CONTRAST  Result Date: 10/02/2019 CLINICAL DATA:  Initial evaluation for acute headache, right-sided weakness. EXAM: CT ANGIOGRAPHY HEAD AND NECK CT PERFUSION BRAIN TECHNIQUE: Multidetector CT imaging of the head and neck was performed using the standard protocol during bolus administration of intravenous contrast. Multiplanar CT image reconstructions and MIPs were obtained to evaluate the vascular anatomy. Carotid stenosis measurements (when applicable) are obtained utilizing NASCET criteria, using the distal internal carotid diameter as the denominator. Multiphase CT imaging of the brain was  performed following IV bolus contrast injection. Subsequent parametric perfusion maps were calculated using RAPID software. CONTRAST:  OMNIPAQUE IOHEXOL 350 MG/ML SOLN COMPARISON:  Prior head CT from earlier same day. FINDINGS: CTA NECK FINDINGS Aortic arch: Visualized aortic arch normal in caliber. Bovine arch with common origin  of the right brachiocephalic and left common carotid artery noted. Mild-to-moderate atheromatous plaque about the arch and origin of the great vessels without hemodynamically significant stenosis. Visualized subclavian arteries widely patent. Right carotid system: Right common carotid artery patent from its origin to the bifurcation without stenosis. Scattered eccentric calcified plaque about the right bifurcation/proximal right ICA with stenosis of up to approximately 50% by NASCET criteria. Right ICA otherwise widely patent distally to the skull base without stenosis, dissection, or occlusion. Left carotid system: Calcified plaque at the origin of the left CCA without high-grade stenosis. Left CCA otherwise patent to the bifurcation without stenosis. Mild scattered calcified plaque about the left bifurcation/proximal left ICA without significant stenosis. Left ICA widely patent distally to the skull base without stenosis, dissection or occlusion. Vertebral arteries: Both vertebral arteries arise from the subclavian arteries. Right vertebral artery dominant with a diffusely hypoplastic left vertebral artery. Focal atheromatous plaque at the origin of the right vertebral artery with approximate 50% stenosis. Right vertebral artery otherwise widely patent within the neck. 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 Distal reconstitution via muscular branches at the left V3 segment (series 7, image 158). Left vertebral is patent as it courses into the cranial vault. Skeleton: No acute osseous abnormality. No discrete or worrisome osseous lesions. Other neck: No other acute  soft tissue abnormality within the neck. No adenopathy. Subcentimeter hypodense nodule noted within the right thyroid lobe, of doubtful significance given size and patient age. No follow-up imaging recommended regarding this lesion. Upper chest: Visualized upper chest demonstrates no acute finding. Partially visualized lungs are clear. Paraseptal and centrilobular emphysematous changes noted at the lung apices. Review of the MIP images confirms the above findings CTA HEAD FINDINGS Anterior circulation: Petrous segments widely patent bilaterally. Cavernous and supraclinoid ICAs patent without flow-limiting stenosis. A1 segments patent bilaterally. Right A1 hypoplastic, accounting for the slightly diminutive right ICA is compared to the left. Normal anterior communicating artery. Anterior cerebral arteries widely patent to their distal aspects. Right M1 widely patent. Normal right MCA bifurcation. Distal right MCA branches well perfused. Left M1 patent proximally. There is occlusive thrombus within the distal left M1 segment/left MCA bifurcation (series 7, image 94). Fairly good perfusion seen within the left MCA branches distally, which could be related to subocclusive thrombus and/or collateralization. Posterior circulation: Vertebral arteries patent to the vertebrobasilar junction without stenosis. Neither PICA of visualized. Basilar widely patent to its distal aspect without stenosis. Superior cerebral arteries patent bilaterally. Both PCAs primarily supplied via the basilar and are well perfused or distal aspects. Small hypoplastic posterior communicating arteries noted bilaterally. Venous sinuses: Patent. Anatomic variants: None significant.  No aneurysm. Review of the MIP images confirms the above findings CT Brain Perfusion Findings: ASPECTS: 10 CBF (<30%) Volume: 19mL Perfusion (Tmax>6.0s) volume: 94mL Mismatch Volume: 75mL Infarction Location:Patchy acute core infarct seen involving the cortical and  subcortical aspect of the mid and posterior left frontal region. Moderate sized surrounding ischemic penumbra. IMPRESSION: CTA HEAD AND NECK IMPRESSION: 1. Positive CTA for emergent large vessel occlusion, with occlusive thrombus involving the distal left M1 segment/left MCA bifurcation. Fairly robust perfusion within the distal left MCA branches distally, could be related to subocclusive thrombus and/or collateralization. 2. 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 3. 50% stenosis at the origin of the dominant right vertebral artery. 4. Hypoplastic left vertebral artery, occluded within the neck, with distal reconstitution by the skull base. 5.  Emphysema (ICD10-J43.9). CT PERFUSION IMPRESSION: 19 cc acute core infarct  involving the mid and posterior left frontal region, left MCA distribution. 94 cc surrounding ischemic penumbra, mismatch volume 75 cc. Critical Value/emergent results were called by telephone at the time of interpretation on 10/02/2019 at 9:05 pm to provider Dr. Deretha Emory, who verbally acknowledged these results. Electronically Signed   By: Rise Mu M.D.   On: 10/02/2019 21:31   CT Code Stroke CTA Neck W/WO contrast  Result Date: 10/09/2019 CLINICAL DATA:  Stroke symptoms. EXAM: CT ANGIOGRAPHY HEAD AND NECK CT PERFUSION BRAIN TECHNIQUE: Multidetector CT imaging of the head and neck was performed using the standard protocol during bolus administration of intravenous contrast. Multiplanar CT image reconstructions and MIPs were obtained to evaluate the vascular anatomy. Carotid stenosis measurements (when applicable) are obtained utilizing NASCET criteria, using the distal internal carotid diameter as the denominator. Multiphase CT imaging of the brain was performed following IV bolus contrast injection. Subsequent parametric perfusion maps were calculated using RAPID software. CONTRAST:  75mL OMNIPAQUE IOHEXOL 350 MG/ML SOLN; 40mL OMNIPAQUE IOHEXOL 350 MG/ML SOLN  COMPARISON:  CTA from 5 days ago FINDINGS: CTA NECK FINDINGS Aortic arch: Atherosclerotic plaque.  Three vessel branching. Right carotid system: Calcified plaque at the ICA bulb without flow limiting stenosis or ulceration. Left carotid system: Mild calcified plaque at the bulb without flow limiting stenosis or ulceration. Vertebral arteries: No proximal subclavian stenosis. There is calcified plaque at both vertebral origins with chronic occlusion on the left. Robust flow in the dominant right vertebral artery. There is reconstitution of the left vertebral artery at the V3 segment. Skeleton: Negative Other neck: Negative Upper chest: Centrilobular emphysema. Trace pleural fluid on the right. Review of the MIP images confirms the above findings CTA HEAD FINDINGS Anterior circulation: Y stent in the left MCA. The upper limb showed in stent thrombosis on prior with poor subsequent the flow, unchanged. The lower limb is newly affected with the same appearance and new poor downstream flow. No contralateral embolus is seen. Posterior circulation: Strong dominance of the right vertebral artery. The vertebrobasilar arteries are smooth and widely patent. No branch occlusion or aneurysm Venous sinuses: Patent Anatomic variants: Negative Review of the MIP images confirms the above findings Case discussed with Dr. Amada Jupiter while in progress. CT Brain Perfusion Findings: CBF (<30%) Volume: 0mL Perfusion (Tmax>6.0s) volume: 20mL IMPRESSION: 1. Recent Y stenting of the left M1 and proximal M2 vessels. There is new in stent narrowing/thrombosis of the lower limb with downstream underfilling. In stent thrombosis of the upper limb persists. 2. Underestimated core infarct with pseudonormalization at the level of the subacute left basal ganglia infarcts and the interval left temporal lobe infarct. There is 20 cc of ischemic range perfusion in the left temporal lobe and deep white matter which is more extensive than the noncontrast  CT abnormality, probable true penumbra. 3. No new site of embolic disease. 4. 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 Electronically Signed   By: Marnee Spring M.D.   On: 10/09/2019 10:20   CT ANGIO NECK W OR WO CONTRAST  Result Date: 10/02/2019 CLINICAL DATA:  Initial evaluation for acute headache, right-sided weakness. EXAM: CT ANGIOGRAPHY HEAD AND NECK CT PERFUSION BRAIN TECHNIQUE: Multidetector CT imaging of the head and neck was performed using the standard protocol during bolus administration of intravenous contrast. Multiplanar CT image reconstructions and MIPs were obtained to evaluate the vascular anatomy. Carotid stenosis measurements (when applicable) are obtained utilizing NASCET criteria, using the distal internal carotid diameter as the denominator. Multiphase  CT imaging of the brain was performed following IV bolus contrast injection. Subsequent parametric perfusion maps were calculated using RAPID software. CONTRAST:  OMNIPAQUE IOHEXOL 350 MG/ML SOLN COMPARISON:  Prior head CT from earlier same day. FINDINGS: CTA NECK FINDINGS Aortic arch: Visualized aortic arch normal in caliber. Bovine arch with common origin of the right brachiocephalic and left common carotid artery noted. Mild-to-moderate atheromatous plaque about the arch and origin of the great vessels without hemodynamically significant stenosis. Visualized subclavian arteries widely patent. Right carotid system: Right common carotid artery patent from its origin to the bifurcation without stenosis. Scattered eccentric calcified plaque about the right bifurcation/proximal right ICA with stenosis of up to approximately 50% by NASCET criteria. Right ICA otherwise widely patent distally to the skull base without stenosis, dissection, or occlusion. Left carotid system: Calcified plaque at the origin of the left CCA without high-grade stenosis. Left CCA otherwise patent to the bifurcation  without stenosis. Mild scattered calcified plaque about the left bifurcation/proximal left ICA without significant stenosis. Left ICA widely patent distally to the skull base without stenosis, dissection or occlusion. Vertebral arteries: Both vertebral arteries arise from the subclavian arteries. Right vertebral artery dominant with a diffusely hypoplastic left vertebral artery. Focal atheromatous plaque at the origin of the right vertebral artery with approximate 50% stenosis. Right vertebral artery otherwise widely patent within the neck. 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 Distal reconstitution via muscular branches at the left V3 segment (series 7, image 158). Left vertebral is patent as it courses into the cranial vault. Skeleton: No acute osseous abnormality. No discrete or worrisome osseous lesions. Other neck: No other acute soft tissue abnormality within the neck. No adenopathy. Subcentimeter hypodense nodule noted within the right thyroid lobe, of doubtful significance given size and patient age. No follow-up imaging recommended regarding this lesion. Upper chest: Visualized upper chest demonstrates no acute finding. Partially visualized lungs are clear. Paraseptal and centrilobular emphysematous changes noted at the lung apices. Review of the MIP images confirms the above findings CTA HEAD FINDINGS Anterior circulation: Petrous segments widely patent bilaterally. Cavernous and supraclinoid ICAs patent without flow-limiting stenosis. A1 segments patent bilaterally. Right A1 hypoplastic, accounting for the slightly diminutive right ICA is compared to the left. Normal anterior communicating artery. Anterior cerebral arteries widely patent to their distal aspects. Right M1 widely patent. Normal right MCA bifurcation. Distal right MCA branches well perfused. Left M1 patent proximally. There is occlusive thrombus within the distal left M1 segment/left MCA bifurcation (series 7, image 94).  Fairly good perfusion seen within the left MCA branches distally, which could be related to subocclusive thrombus and/or collateralization. Posterior circulation: Vertebral arteries patent to the vertebrobasilar junction without stenosis. Neither PICA of visualized. Basilar widely patent to its distal aspect without stenosis. Superior cerebral arteries patent bilaterally. Both PCAs primarily supplied via the basilar and are well perfused or distal aspects. Small hypoplastic posterior communicating arteries noted bilaterally. Venous sinuses: Patent. Anatomic variants: None significant.  No aneurysm. Review of the MIP images confirms the above findings CT Brain Perfusion Findings: ASPECTS: 10 CBF (<30%) Volume: 91mL Perfusion (Tmax>6.0s) volume: 39mL Mismatch Volume: 60mL Infarction Location:Patchy acute core infarct seen involving the cortical and subcortical aspect of the mid and posterior left frontal region. Moderate sized surrounding ischemic penumbra. IMPRESSION: CTA HEAD AND NECK IMPRESSION: 1. Positive CTA for emergent large vessel occlusion, with occlusive thrombus involving the distal left M1 segment/left MCA bifurcation. Fairly robust perfusion within the distal left MCA branches distally, could be  related to subocclusive thrombus and/or collateralization. 2. 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 3. 50% stenosis at the origin of the dominant right vertebral artery. 4. Hypoplastic left vertebral artery, occluded within the neck, with distal reconstitution by the skull base. 5.  Emphysema (ICD10-J43.9). CT PERFUSION IMPRESSION: 19 cc acute core infarct involving the mid and posterior left frontal region, left MCA distribution. 94 cc surrounding ischemic penumbra, mismatch volume 75 cc. Critical Value/emergent results were called by telephone at the time of interpretation on 10/02/2019 at 9:05 pm to provider Dr. Deretha Emory, who verbally acknowledged these results. Electronically Signed   By:  Rise Mu M.D.   On: 10/02/2019 21:31   MR ANGIO HEAD WO CONTRAST  Addendum Date: 10/03/2019   ADDENDUM REPORT: 10/03/2019 23:31 ADDENDUM: Left MCA findings discussed by telephone with Dr. Milon Dikes on 10/03/2019 at 2320 hours. And we discussed that a repeat CTA may be valuable to further quantify the Left MCA reperfusion. Electronically Signed   By: Odessa Fleming M.D.   On: 10/03/2019 23:31   Result Date: 10/03/2019 CLINICAL DATA:  54 year old female status post emergent large vessel occlusion, left M1 thrombus treated with endovascular reperfusion with rescue stenting. EXAM: MRI HEAD WITHOUT CONTRAST MRA HEAD WITHOUT CONTRAST TECHNIQUE: Multiplanar, multiecho pulse sequences of the brain and surrounding structures were obtained without intravenous contrast. Angiographic images of the head were obtained using MRA technique without contrast. COMPARISON:  CTA head and neck, CT Perfusion yesterday. FINDINGS: MRI HEAD FINDINGS Brain: Left MCA territory restricted diffusion, most confluent at the posterior left caudate and left lentiform, with additional scattered small cortical and subcortical areas around the left insula and left inferior frontal gyrus. This is similar to although not exactly corresponding to the predicted core infarct on CTP (19 mL on that exam). Associated T2 and FLAIR hyperintensity with no convincing hemorrhagic transformation. No mass effect. No contralateral right hemisphere or posterior fossa restricted diffusion. Scattered bilateral small and mostly subcortical white matter T2 and FLAIR hyperintense foci. 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 No midline shift, mass effect, evidence of mass lesion, ventriculomegaly, extra-axial collection or acute intracranial hemorrhage. Cervicomedullary junction and pituitary are within normal limits. Vascular: Major intracranial vascular flow voids are preserved.  Skull and upper cervical spine: Negative visible cervical spine. Visualized bone marrow signal is within normal limits. Sinuses/Orbits: Negative orbits. Posterior ethmoid and sphenoid sinus mucosal thickening. Other: Layering fluid in the pharynx. Mastoids remain clear. The patient is intubated with an oral enteric tube also partially visible. MRA HEAD FINDINGS Antegrade flow in the posterior circulation with dominant right vertebral artery. Patent left vertebrobasilar junction. Normal right PICA origin. Patent basilar artery. Patent AICA, SCA and PCA origins. Both posterior communicating arteries are present. Bilateral PCA branches are within normal limits. Antegrade flow in both ICA siphons. No siphon stenosis identified. Ophthalmic and posterior communicating artery origins appear normal. Patent carotid termini. Patent MCA and ACA origins. The left ACA appears dominant. And there is a 2 mm infundibulum versus aneurysm directed superiorly from the anterior communicating artery region on series 2, image 93. But otherwise the visible ACA branches are within normal limits. Right MCA M1 segment, right MCA bifurcation and visible right MCA branches are within normal limits. The left M1 was stented. The left MCA origin is patent, with loss of most of the left MCA flow signal, although there is flow signal detected at the left MCA bifurcation. However, there is  a paucity of left MCA branches when compared to the right side. Furthermore, there is asymmetric FLAIR signal within left MCA M2 and M3 branches on the anatomic images above. IMPRESSION: 1. The Left MCA was stented which is at least partially responsible for the decreased left M1 flow signal. However, decreased visualization of left MCA branches on MRA and increased branch FLAIR signal raises the possibility of residual hemodynamically significant MCA stenosis. 2. MRA is negative for other intracranial stenosis. 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 3. MRI reveals patchy left MCA infarcts most confluent at the posterior caudate and lentiform. Overall infarct volume probably similar to that predicted on CTP yesterday. 4. No intracranial mass effect or hemorrhagic transformation. 5. Chronic left corona radiata infarct. Electronically Signed: By: Odessa Fleming M.D. On: 10/03/2019 23:18   MR BRAIN WO CONTRAST  Result Date: 10/09/2019 CLINICAL DATA:  Stroke. EXAM: MRI HEAD WITHOUT CONTRAST TECHNIQUE: Multiplanar, multiecho pulse sequences of the brain and surrounding structures were obtained without intravenous contrast. COMPARISON:  MRI 10/04/2019 FINDINGS: Brain: New area of acute infarct in the left superior temporal lobe. No change in acute infarct in the left lenticular nuclei and in the left frontal white matter. Small areas of acute infarct in the left insula unchanged. Ventricle size normal. Mild chronic microvascular ischemic change in the white matter. No acute hemorrhage or mass. Vascular: Left MCA stent noted. Increased signal on FLAIR and left MCA branches may reflect slow flow or occlusion. CTA today demonstrates left MCA thrombus with decreased flow Normal arterial flow void right vertebral artery as well as in the vertebrobasilar arteries. Skull and upper cervical spine: No focal skeletal lesion. Sinuses/Orbits: Mucosal edema paranasal sinuses.  Negative orbit Other: None IMPRESSION: Acute infarct left MCA territory. New area of acute infarct left superior temporal lobe compared with the prior MRI 10/03/2019. Abnormal signal in the left MCA branches compatible with slow flow or occlusion. See CTA result from earlier today. No acute intracranial hemorrhage. Electronically Signed   By: Marlan Palau M.D.   On: 10/09/2019 15:48   MR BRAIN WO CONTRAST  Addendum Date: 10/03/2019   ADDENDUM REPORT: 10/03/2019 23:31 ADDENDUM: Left MCA findings discussed by telephone with Dr. Milon Dikes on 10/03/2019 at 2320  hours. And we discussed that a repeat CTA may be valuable to further quantify the Left MCA reperfusion. Electronically Signed   By: Odessa Fleming M.D.   On: 10/03/2019 23:31   Result Date: 10/03/2019 CLINICAL DATA:  54 year old female status post emergent large vessel occlusion, left M1 thrombus treated with endovascular reperfusion with rescue stenting. EXAM: MRI HEAD WITHOUT CONTRAST MRA HEAD WITHOUT CONTRAST TECHNIQUE: Multiplanar, multiecho pulse sequences of the brain and surrounding structures were obtained without intravenous contrast. Angiographic images of the head were obtained using MRA technique without contrast. COMPARISON:  CTA head and neck, CT Perfusion yesterday. FINDINGS: MRI HEAD FINDINGS Brain: Left MCA territory restricted diffusion, most confluent at the posterior left caudate and left lentiform, with additional scattered small cortical and subcortical areas around the left insula and left inferior frontal gyrus. This is similar to although not exactly corresponding to the predicted core infarct on CTP (19 mL on that exam). Associated T2 and FLAIR hyperintensity with no convincing hemorrhagic transformation. No mass effect. No contralateral right hemisphere or posterior fossa restricted diffusion. Scattered bilateral small and mostly subcortical white matter T2 and FLAIR hyperintense foci. No chronic cortical encephalomalacia identified, but there is evidence of a  chronic left corona radiata lacunar infarct. No midline shift, mass effect, evidence of mass lesion, ventriculomegaly, extra-axial collection or acute intracranial hemorrhage. Cervicomedullary junction and pituitary are within normal limits. Vascular: Major intracranial vascular flow voids are preserved. Skull and upper cervical spine: Negative visible cervical spine. Visualized bone marrow signal is within normal limits. Sinuses/Orbits: Negative orbits. Posterior ethmoid and sphenoid sinus mucosal thickening. Other: Layering fluid in the  pharynx. Mastoids remain clear. The patient is intubated with an oral enteric tube also partially visible. MRA HEAD FINDINGS Antegrade flow in the posterior circulation with dominant right vertebral artery. Patent left vertebrobasilar junction. Normal right PICA origin. Patent basilar artery. Patent AICA, SCA and PCA origins. Both posterior communicating arteries are present. Bilateral PCA branches are within normal limits. Antegrade flow in both ICA siphons. No siphon stenosis identified. Ophthalmic and posterior communicating artery origins appear normal. Patent carotid termini. Patent MCA and ACA origins. The left ACA appears dominant. And there is a 2 mm infundibulum versus aneurysm directed superiorly from the anterior communicating artery region on series 2, image 93. But otherwise the visible ACA branches are within normal limits. Right MCA M1 segment, right MCA bifurcation and visible right MCA branches are within normal limits. The left M1 was stented. The left MCA origin is patent, with loss of most of the left MCA flow signal, although there is flow signal detected at the left MCA bifurcation. However, there is a paucity of left MCA branches when compared to the right side. Furthermore, there is asymmetric FLAIR signal within left MCA M2 and M3 branches on the anatomic images above. IMPRESSION: 1. The Left MCA was stented which is at least partially responsible for the decreased left M1 flow signal. However, decreased visualization of left MCA branches on MRA and increased branch FLAIR signal raises the possibility of residual hemodynamically significant MCA stenosis. 2. MRA is negative for other intracranial stenosis. 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 3. MRI reveals patchy left MCA infarcts most confluent at the posterior caudate and lentiform. Overall infarct volume probably similar to that predicted on CTP yesterday. 4. No intracranial mass effect  or hemorrhagic transformation. 5. Chronic left corona radiata infarct. Electronically Signed: By: Odessa Fleming M.D. On: 10/03/2019 23:18   IR Intra Cran Stent  Result Date: 10/04/2019 INDICATION: New onset aphasia, right-sided paralysis, and left gaze deviation. 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 EXAM: 1. EMERGENT LARGE VESSEL OCCLUSION THROMBOLYSIS (anterior CIRCULATION) 2. Intracranial rescue stent placement COMPARISON:  CT angiogram of the head and neck of October 03, 2019. MEDICATIONS: Ancef 2 g IV antibiotic was administered within 1 hour of the procedure. ANESTHESIA/SEDATION: General anesthesia. CONTRAST:  Isovue 300 approximately 120 mL. FLUOROSCOPY TIME:  Fluoroscopy Time: 76 minutes 36 seconds (3040 mGy). COMPLICATIONS: None immediate. TECHNIQUE: Following a full explanation of the procedure along with the potential associated complications, an informed witnessed consent was obtained patient's spouse. The risks of intracranial hemorrhage of 10%, worsening neurological deficit, ventilator dependency, death and inability to revascularize were all reviewed in detail with the patient's spouse. The patient was then put under general anesthesia by the Department of Anesthesiology at [facility]. The right groin was prepped and draped in the usual sterile fashion. Thereafter using modified Seldinger technique, transfemoral access into the right common femoral artery was obtained without difficulty. Over a 0.035 inch guidewire an 8 French 25 cm Pinnacle sheath was inserted. Through  this, and also over a 0.035 inch guidewire a connection of a Berenstein 125 cm catheter inside of an 087 95 cm balloon guide catheter was advanced to the left common carotid artery. The guidewire, and the Berenstein support catheter were then removed. Good aspiration obtained from the hub of the balloon guide catheter. A gentle control arteriogram performed through balloon guide  catheter demonstrated no evidence of spasms, dissections or of intraluminal filling defects. FINDINGS: However, the left common carotid arteriogram demonstrates the left external carotid artery and its major branches to be widely patent. The left internal carotid artery at the bulb to the cranial skull base also demonstrates wide patency. The petrous, the cavernous and the supraclinoid segments are intact. The left anterior cerebral artery opacifies into the capillary and venous phases with prompt cross-filling of the right anterior cerebral A2 segment from the left internal carotid artery injection. The left middle cerebral artery demonstrates complete occlusion in its mid M1 segment. The delayed arterial phase demonstrates partial retrograde opacification of the anterior to middle perisylvian branches. PROCEDURE: Over a 0.014 inch standard Synchro micro guidewire with a J configuration the combination of a Trevo ProVue 021 microcatheter inside of a 6 French 132 cm Catalyst guide catheter was advanced using biplane roadmap technique and constant fluoroscopic guidance to the supraclinoid left ICA. The left middle cerebral artery was then entered with the micro guidewire and into the superior division of the left middle cerebral artery followed by the microcatheter. The guidewire was removed. Good aspiration obtained from the hub of the microcatheter. A gentle control arteriogram performed through this demonstrated safe position of the tip of the microcatheter which was then connected to continuous heparinized saline infusion. The 6 [place] Catalyst guide catheter was then advanced into the distal left middle cerebral artery with near complete occlusion of the aspiration with a Penumbra aspiration device for approximately 2-1/2 minutes. A 4 mm x 40 mm Solitaire X retrieval device was then advanced to the distal end of the microcatheter and deployed with slight forward gentle traction with the right hand on the  delivery micro guidewire, and retrieving the delivery microcatheter. Proximal flow arrest was then established by inflating the balloon of the balloon guide catheter in the mid left internal carotid artery. With constant aspiration applied at the hub of the 6 [place] Catalyst guide catheter, and with a 60 mL syringe at the hub of the balloon guide catheter, the combination of the retrieval device, and the microcatheter and the 6 [place] Catalyst catheter were retrieved and removed. Flow arrest was then reversed. A control arteriogram performed through the balloon guide catheter in the left internal carotid artery continued to demonstrate occluded left middle cerebral M1 segment. No evidence of clot was seen in the aspirate or entangled in the retrieval device. A second pass was then made again using the above combination. At this time access with the micro guidewire and the microcatheter was obtained in the inferior division of the left middle cerebral artery M2 M3 region where the microcatheter was positioned. The guidewire was removed. Good aspiration obtained from the hub of the microcatheter. A gentle control arteriogram performed through the microcatheter demonstrates safe position of the tip of the microcatheter. A 6 mm x 40 mm Solitaire X retrieval device was then advanced to the distal end of the microcatheter. The O ring on the delivery microcatheter was loosened. With slight forward gentle traction with the right hand on the delivery micro guidewire, with the left hand the delivery microcatheter  was retrieved deploying the retrieval device. The 6 [place] Catalyst guide was now engaged again in the occluded left middle cerebral artery distally to near complete occlusion to aspiration with the Penumbra aspiration device over 2-1/2 minutes. With proximal flow arrest and constant aspiration being applied at the hub of the balloon guide catheter in the left internal carotid artery, the combination was retrieved  and removed. Again no evidence of a clot was seen in the aspirate or in the retrieval device. A control arteriogram performed through the balloon guide catheter in the left internal carotid artery again demonstrated no change in the occluded left middle cerebral artery M1 segment. A third pass was then made using a large-bore catheter inside of which was the 021 Trevo microcatheter again advanced as the combination over a 0.14 inch standard Synchro micro guidewire to the supraclinoid left ICA. Again the micro guidewire was then gently manipulated with a torque device and this time advanced into the middle branch of the left MCA trifurcation between the inferior and the superior divisions. This is then followed by the microcatheter into the M2 M3 region. The guidewire was removed. Good aspiration obtained from the hub of the microcatheter. A gentle control arteriogram performed through the microcatheter demonstrated safe position of the tip of the microcatheter which was then connected to continuous heparinized saline infusion. A 4 mm x 40 mm Solitaire X retrieval device was again advanced to the distal end of the microcatheter. This was again deployed in the usual manner. A gentle control arteriogram performed through the 6 French large-bore catheter in the proximal left MCA now demonstrated a TICI 2c revascularization with an overlying severe atherosclerotic stenosis at the origin of the middle branch and extending into the origin of the inferior division. Again with proximal flow arrest in the left internal carotid artery, and constant aspiration with the Penumbra aspirate device with the large-bore catheter embedded in the occluded distal left MCA over 2-1/2 minutes the combination was retrieved and removed. Again no evidence of clot was found in the retrieval device, or aspirate. A control arteriogram performed following flow reversal in the left internal carotid artery demonstrated angiographically occluded  left middle cerebral artery. It was felt overlying pathology was related to an underlying atherosclerotic plaque causing significant stenosis. Following discussion with the referring neuro hospitalist, it was decided to proceed with rescue stenting. At this time, the patient was loaded with Brilinta 180 mg, and aspirin 81 mg via an orogastric tube. A combination of a Headway 17 2 tip microcatheter inside of a 014 inch standard Synchro micro guidewire inside of a large-bore support catheter was then advanced as mentioned earlier to the supraclinoid left ICA. The micro guidewire was then gently manipulated with a torque device and advanced without difficulty into the inferior division of the left MCA trifurcation followed by the microcatheter. The guidewire was removed. Good aspiration obtained from the hub of the Headway microcatheter. A gentle control arteriogram performed through microcatheter demonstrated safe position of the tip of the microcatheter. A 4 mm x 24 mm Atlas stent delivery system was then advanced to the distal end of the microcatheter, the delivery microcatheter was then positioned such that the proximal portion of the landing zone stent would be just inside the left middle cerebral artery origin. The O ring on the delivery microcatheter was loosened. With slight forward gentle traction with the right hand on the delivery micro guidewire with the left hand the delivery microcatheter was retrieved unsheathing the distal and the  proximal portion of the stent in excellent position. The tines of the stent opened completely proximally and distally. A control arteriogram performed through the large bore catheter demonstrated excellent revascularization of the inferior division into M3 M4 branches. The middle branch of the trifurcation remained occluded. The micro guidewire was reintroduced into the microcatheter and advanced without difficulty into the proximal portion of the deployed stent. Using a  torque device, access was obtained into the middle branch without difficulty followed by the microcatheter was advanced to the M2 M3 region. The guidewire was removed. Good aspiration obtained from the hub of the microcatheter which was then connected to continuous heparinized saline infusion. Again another 4 mm x 24 mm Atlas Neuroform stent was advanced to the distal end of the microcatheter. This was again deployed such that the proximal portion of the stent was in the proximal portion of the left MCA. Following deployment, a control arteriogram performed through the large bore catheter in the left middle cerebral artery demonstrated a TICI 2c revascularization. A control arteriogram performed approximately 5 minutes later demonstrated progressive slow flow in the middle branch of the stented segment. This prompted the use of approximately 6 mg of intra-arterial Integrilin for 10 minutes. A control arteriogram performed approximately 10 minutes later demonstrated slow revascularization of this stented branch. It was, therefore, decided to stop at this juncture. The large-bore catheter and the balloon guide catheter were retrieved and removed. A CT of the brain demonstrated no evidence of a gross hemorrhage, mass effect or midline shift. There was dense contrast staining in the basal ganglia region. Following this it was decided to proceed with a slow IV infusion of aggrastat and half the minimal dose run as an infusion over approximately 8-12 hours in order to protect the stented segments of the left middle cerebral artery. Patient was left intubated on account of the procedure, and also patient had received IV tPA and multiple dual antiplatelets. Additionally, the 8 French Pinnacle sheath in the right groin was left and connected to continuous heparinized saline infusion. Prior to the placement of rescue stents at the superior and inferior divisions of the left middle cerebral artery, a CT was performed of the  brain on the table. This continued to demonstrate no evidence of a gross hemorrhage, or mass effect or midline shift. Contrast stain was seen in the basal ganglia region. Distal pulses remained palpable in the dorsalis pedis, and the posterior tibial regions bilaterally. The patient was then transferred intubated to the neuro ICU to continue with post revascularization care. IMPRESSION: Status post endovascular revascularization of occluded left middle cerebral artery M1 segment, with 2 passes with the Solitaire 4 mm x 40 mm X retrieval device, 1 pass with the 6 mm x 40 mm Solitaire X retrieval device, with Penumbra aspiration, and placement of a rescue stents in the superior and inferior divisions of the left middle cerebral artery achieving a TICI 2b to TICI 2c revascularization. PLAN: Follow-up in the clinic 4 weeks post discharge. Electronically Signed   By: Julieanne Cotton M.D.   On: 10/03/2019 09:56   IR CT Head Ltd  Result Date: 10/04/2019 INDICATION: New onset aphasia, right-sided paralysis, and left gaze deviation. 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 EXAM: 1. EMERGENT LARGE VESSEL OCCLUSION THROMBOLYSIS (anterior CIRCULATION) 2. Intracranial rescue stent placement COMPARISON:  CT angiogram of the head and neck of October 03, 2019. MEDICATIONS: Ancef 2 g IV antibiotic was administered within 1  hour of the procedure. ANESTHESIA/SEDATION: General anesthesia. CONTRAST:  Isovue 300 approximately 120 mL. FLUOROSCOPY TIME:  Fluoroscopy Time: 76 minutes 36 seconds (3040 mGy). COMPLICATIONS: None immediate. TECHNIQUE: Following a full explanation of the procedure along with the potential associated complications, an informed witnessed consent was obtained patient's spouse. The risks of intracranial hemorrhage of 10%, worsening neurological deficit, ventilator dependency, death and inability to revascularize were all reviewed in detail with the patient's spouse.  The patient was then put under general anesthesia by the Department of Anesthesiology at [facility]. The right groin was prepped and draped in the usual sterile fashion. Thereafter using modified Seldinger technique, transfemoral access into the right common femoral artery was obtained without difficulty. Over a 0.035 inch guidewire an 8 French 25 cm Pinnacle sheath was inserted. Through this, and also over a 0.035 inch guidewire a connection of a Berenstein 125 cm catheter inside of an 087 95 cm balloon guide catheter was advanced to the left common carotid artery. The guidewire, and the Berenstein support catheter were then removed. Good aspiration obtained from the hub of the balloon guide catheter. A gentle control arteriogram performed through balloon guide catheter demonstrated no evidence of spasms, dissections or of intraluminal filling defects. FINDINGS: However, the left common carotid arteriogram demonstrates the left external carotid artery and its major branches to be widely patent. The left internal carotid artery at the bulb to the cranial skull base also demonstrates wide patency. The petrous, the cavernous and the supraclinoid segments are intact. The left anterior cerebral artery opacifies into the capillary and venous phases with prompt cross-filling of the right anterior cerebral A2 segment from the left internal carotid artery injection. The left middle cerebral artery demonstrates complete occlusion in its mid M1 segment. The delayed arterial phase demonstrates partial retrograde opacification of the anterior to middle perisylvian branches. PROCEDURE: Over a 0.014 inch standard Synchro micro guidewire with a J configuration the combination of a Trevo ProVue 021 microcatheter inside of a 6 French 132 cm Catalyst guide catheter was advanced using biplane roadmap technique and constant fluoroscopic guidance to the supraclinoid left ICA. The left middle cerebral artery was then entered  with the micro guidewire and into the superior division of the left middle cerebral artery followed by the microcatheter. The guidewire was removed. Good aspiration obtained from the hub of the microcatheter. A gentle control arteriogram performed through this demonstrated safe position of the tip of the microcatheter which was then connected to continuous heparinized saline infusion. The 6 [place] Catalyst guide catheter was then advanced into the distal left middle cerebral artery with near complete occlusion of the aspiration with a Penumbra aspiration device for approximately 2-1/2 minutes. A 4 mm x 40 mm Solitaire X retrieval device was then advanced to the distal end of the microcatheter and deployed with slight forward gentle traction with the right hand on the delivery micro guidewire, and retrieving the delivery microcatheter. Proximal flow arrest was then established by inflating the balloon of the balloon guide catheter in the mid left internal carotid artery. With constant aspiration applied at the hub of the 6 [place] Catalyst guide catheter, and with a 60 mL syringe at the hub of the balloon guide catheter, the combination of the retrieval device, and the microcatheter and the 6 [place] Catalyst catheter were retrieved and removed. Flow arrest was then reversed. A control arteriogram performed through the balloon guide catheter in the left internal carotid artery continued to demonstrate occluded left middle cerebral M1 segment.  No evidence of clot was seen in the aspirate or entangled in the retrieval device. A second pass was then made again using the above combination. At this time access with the micro guidewire and the microcatheter was obtained in the inferior division of the left middle cerebral artery M2 M3 region where the microcatheter was positioned. The guidewire was removed. Good aspiration obtained from the hub of the microcatheter. A gentle control arteriogram performed through the  microcatheter demonstrates safe position of the tip of the microcatheter. A 6 mm x 40 mm Solitaire X retrieval device was then advanced to the distal end of the microcatheter. The O ring on the delivery microcatheter was loosened. With slight forward gentle traction with the right hand on the delivery micro guidewire, with the left hand the delivery microcatheter was retrieved deploying the retrieval device. The 6 [place] Catalyst guide was now engaged again in the occluded left middle cerebral artery distally to near complete occlusion to aspiration with the Penumbra aspiration device over 2-1/2 minutes. With proximal flow arrest and constant aspiration being applied at the hub of the balloon guide catheter in the left internal carotid artery, the combination was retrieved and removed. Again no evidence of a clot was seen in the aspirate or in the retrieval device. A control arteriogram performed through the balloon guide catheter in the left internal carotid artery again demonstrated no change in the occluded left middle cerebral artery M1 segment. A third pass was then made using a large-bore catheter inside of which was the 021 Trevo microcatheter again advanced as the combination over a 0.14 inch standard Synchro micro guidewire to the supraclinoid left ICA. Again the micro guidewire was then gently manipulated with a torque device and this time advanced into the middle branch of the left MCA trifurcation between the inferior and the superior divisions. This is then followed by the microcatheter into the M2 M3 region. The guidewire was removed. Good aspiration obtained from the hub of the microcatheter. A gentle control arteriogram performed through the microcatheter demonstrated safe position of the tip of the microcatheter which was then connected to continuous heparinized saline infusion. A 4 mm x 40 mm Solitaire X retrieval device was again advanced to the distal end of the microcatheter. This was again  deployed in the usual manner. A gentle control arteriogram performed through the 6 French large-bore catheter in the proximal left MCA now demonstrated a TICI 2c revascularization with an overlying severe atherosclerotic stenosis at the origin of the middle branch and extending into the origin of the inferior division. Again with proximal flow arrest in the left internal carotid artery, and constant aspiration with the Penumbra aspirate device with the large-bore catheter embedded in the occluded distal left MCA over 2-1/2 minutes the combination was retrieved and removed. Again no evidence of clot was found in the retrieval device, or aspirate. A control arteriogram performed following flow reversal in the left internal carotid artery demonstrated angiographically occluded left middle cerebral artery. It was felt overlying pathology was related to an underlying atherosclerotic plaque causing significant stenosis. Following discussion with the referring neuro hospitalist, it was decided to proceed with rescue stenting. At this time, the patient was loaded with Brilinta 180 mg, and aspirin 81 mg via an orogastric tube. A combination of a Headway 17 2 tip microcatheter inside of a 014 inch standard Synchro micro guidewire inside of a large-bore support catheter was then advanced as mentioned earlier to the supraclinoid left ICA. The micro guidewire was  then gently manipulated with a torque device and advanced without difficulty into the inferior division of the left MCA trifurcation followed by the microcatheter. The guidewire was removed. Good aspiration obtained from the hub of the Headway microcatheter. A gentle control arteriogram performed through microcatheter demonstrated safe position of the tip of the microcatheter. A 4 mm x 24 mm Atlas stent delivery system was then advanced to the distal end of the microcatheter, the delivery microcatheter was then positioned such that the proximal portion of the landing  zone stent would be just inside the left middle cerebral artery origin. The O ring on the delivery microcatheter was loosened. With slight forward gentle traction with the right hand on the delivery micro guidewire with the left hand the delivery microcatheter was retrieved unsheathing the distal and the proximal portion of the stent in excellent position. The tines of the stent opened completely proximally and distally. A control arteriogram performed through the large bore catheter demonstrated excellent revascularization of the inferior division into M3 M4 branches. The middle branch of the trifurcation remained occluded. The micro guidewire was reintroduced into the microcatheter and advanced without difficulty into the proximal portion of the deployed stent. Using a torque device, access was obtained into the middle branch without difficulty followed by the microcatheter was advanced to the M2 M3 region. The guidewire was removed. Good aspiration obtained from the hub of the microcatheter which was then connected to continuous heparinized saline infusion. Again another 4 mm x 24 mm Atlas Neuroform stent was advanced to the distal end of the microcatheter. This was again deployed such that the proximal portion of the stent was in the proximal portion of the left MCA. Following deployment, a control arteriogram performed through the large bore catheter in the left middle cerebral artery demonstrated a TICI 2c revascularization. A control arteriogram performed approximately 5 minutes later demonstrated progressive slow flow in the middle branch of the stented segment. This prompted the use of approximately 6 mg of intra-arterial Integrilin for 10 minutes. A control arteriogram performed approximately 10 minutes later demonstrated slow revascularization of this stented branch. It was, therefore, decided to stop at this juncture. The large-bore catheter and the balloon guide catheter were retrieved and removed. A CT  of the brain demonstrated no evidence of a gross hemorrhage, mass effect or midline shift. There was dense contrast staining in the basal ganglia region. Following this it was decided to proceed with a slow IV infusion of aggrastat and half the minimal dose run as an infusion over approximately 8-12 hours in order to protect the stented segments of the left middle cerebral artery. Patient was left intubated on account of the procedure, and also patient had received IV tPA and multiple dual antiplatelets. Additionally, the 8 French Pinnacle sheath in the right groin was left and connected to continuous heparinized saline infusion. Prior to the placement of rescue stents at the superior and inferior divisions of the left middle cerebral artery, a CT was performed of the brain on the table. This continued to demonstrate no evidence of a gross hemorrhage, or mass effect or midline shift. Contrast stain was seen in the basal ganglia region. Distal pulses remained palpable in the dorsalis pedis, and the posterior tibial regions bilaterally. The patient was then transferred intubated to the neuro ICU to continue with post revascularization care. IMPRESSION: Status post endovascular revascularization of occluded left middle cerebral artery M1 segment, with 2 passes with the Solitaire 4 mm x 40 mm X retrieval device,  1 pass with the 6 mm x 40 mm Solitaire X retrieval device, with Penumbra aspiration, and placement of a rescue stents in the superior and inferior divisions of the left middle cerebral artery achieving a TICI 2b to TICI 2c revascularization. PLAN: Follow-up in the clinic 4 weeks post discharge. Electronically Signed   By: Julieanne Cotton M.D.   On: 10/03/2019 09:56   CT CEREBRAL PERFUSION W CONTRAST  Result Date: 10/09/2019 CLINICAL DATA:  Stroke symptoms. EXAM: CT ANGIOGRAPHY HEAD AND NECK CT PERFUSION BRAIN TECHNIQUE: Multidetector CT imaging of the head and neck was performed using the standard  protocol during bolus administration of intravenous contrast. Multiplanar CT image reconstructions and MIPs were obtained to evaluate the vascular anatomy. Carotid stenosis measurements (when applicable) are obtained utilizing NASCET criteria, using the distal internal carotid diameter as the denominator. Multiphase CT imaging of the brain was performed following IV bolus contrast injection. Subsequent parametric perfusion maps were calculated using RAPID software. CONTRAST:  75mL OMNIPAQUE IOHEXOL 350 MG/ML SOLN; 40mL OMNIPAQUE IOHEXOL 350 MG/ML SOLN COMPARISON:  CTA from 5 days ago FINDINGS: CTA NECK FINDINGS Aortic arch: Atherosclerotic plaque.  Three vessel branching. Right carotid system: Calcified plaque at the ICA bulb without flow limiting stenosis or ulceration. Left carotid system: Mild calcified plaque at the bulb without flow limiting stenosis or ulceration. Vertebral arteries: No proximal subclavian stenosis. There is calcified plaque at both vertebral origins with chronic occlusion on the left. Robust flow in the dominant right vertebral artery. There is reconstitution of the left vertebral artery at the V3 segment. Skeleton: Negative Other neck: Negative Upper chest: Centrilobular emphysema. Trace pleural fluid on the right. Review of the MIP images confirms the above findings CTA HEAD FINDINGS Anterior circulation: Y stent in the left MCA. The upper limb showed in stent thrombosis on prior with poor subsequent the flow, unchanged. The lower limb is newly affected with the same appearance and new poor downstream flow. No contralateral embolus is seen. Posterior circulation: Strong dominance of the right vertebral artery. The vertebrobasilar arteries are smooth and widely patent. No branch occlusion or aneurysm Venous sinuses: Patent Anatomic variants: Negative Review of the MIP images confirms the above findings Case discussed with Dr. Amada Jupiter while in progress. CT Brain Perfusion Findings: CBF  (<30%) Volume: 0mL Perfusion (Tmax>6.0s) volume: 20mL IMPRESSION: 1. Recent Y stenting of the left M1 and proximal M2 vessels. There is new in stent narrowing/thrombosis of the lower limb with downstream underfilling. In stent thrombosis of the upper limb persists. 2. Underestimated core infarct with pseudonormalization at the level of the subacute left basal ganglia infarcts and the interval left temporal lobe infarct. There is 20 cc of ischemic range perfusion in the left temporal lobe and deep white matter which is more extensive than the noncontrast CT abnormality, probable true penumbra. 3. No new site of embolic disease. 4. 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 Electronically Signed   By: Marnee Spring M.D.   On: 10/09/2019 10:20   CT CEREBRAL PERFUSION W CONTRAST  Result Date: 10/02/2019 CLINICAL DATA:  Initial evaluation for acute headache, right-sided weakness. EXAM: CT ANGIOGRAPHY HEAD AND NECK CT PERFUSION BRAIN TECHNIQUE: Multidetector CT imaging of the head and neck was performed using the standard protocol during bolus administration of intravenous contrast. Multiplanar CT image reconstructions and MIPs were obtained to evaluate the vascular anatomy. Carotid stenosis measurements (when applicable) are obtained utilizing NASCET criteria, using the distal internal carotid diameter as the denominator.  Multiphase CT imaging of the brain was performed following IV bolus contrast injection. Subsequent parametric perfusion maps were calculated using RAPID software. CONTRAST:  OMNIPAQUE IOHEXOL 350 MG/ML SOLN COMPARISON:  Prior head CT from earlier same day. FINDINGS: CTA NECK FINDINGS Aortic arch: Visualized aortic arch normal in caliber. Bovine arch with common origin of the right brachiocephalic and left common carotid artery noted. Mild-to-moderate atheromatous plaque about the arch and origin of the great vessels without hemodynamically  significant stenosis. Visualized subclavian arteries widely patent. Right carotid system: Right common carotid artery patent from its origin to the bifurcation without stenosis. Scattered eccentric calcified plaque about the right bifurcation/proximal right ICA with stenosis of up to approximately 50% by NASCET criteria. Right ICA otherwise widely patent distally to the skull base without stenosis, dissection, or occlusion. Left carotid system: Calcified plaque at the origin of the left CCA without high-grade stenosis. Left CCA otherwise patent to the bifurcation without stenosis. Mild scattered calcified plaque about the left bifurcation/proximal left ICA without significant stenosis. Left ICA widely patent distally to the skull base without stenosis, dissection or occlusion. Vertebral arteries: Both vertebral arteries arise from the subclavian arteries. Right vertebral artery dominant with a diffusely hypoplastic left vertebral artery. Focal atheromatous plaque at the origin of the right vertebral artery with approximate 50% stenosis. Right vertebral artery otherwise widely patent within the neck. 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 Distal reconstitution via muscular branches at the left V3 segment (series 7, image 158). Left vertebral is patent as it courses into the cranial vault. Skeleton: No acute osseous abnormality. No discrete or worrisome osseous lesions. Other neck: No other acute soft tissue abnormality within the neck. No adenopathy. Subcentimeter hypodense nodule noted within the right thyroid lobe, of doubtful significance given size and patient age. No follow-up imaging recommended regarding this lesion. Upper chest: Visualized upper chest demonstrates no acute finding. Partially visualized lungs are clear. Paraseptal and centrilobular emphysematous changes noted at the lung apices. Review of the MIP images confirms the above findings CTA HEAD FINDINGS Anterior circulation:  Petrous segments widely patent bilaterally. Cavernous and supraclinoid ICAs patent without flow-limiting stenosis. A1 segments patent bilaterally. Right A1 hypoplastic, accounting for the slightly diminutive right ICA is compared to the left. Normal anterior communicating artery. Anterior cerebral arteries widely patent to their distal aspects. Right M1 widely patent. Normal right MCA bifurcation. Distal right MCA branches well perfused. Left M1 patent proximally. There is occlusive thrombus within the distal left M1 segment/left MCA bifurcation (series 7, image 94). Fairly good perfusion seen within the left MCA branches distally, which could be related to subocclusive thrombus and/or collateralization. Posterior circulation: Vertebral arteries patent to the vertebrobasilar junction without stenosis. Neither PICA of visualized. Basilar widely patent to its distal aspect without stenosis. Superior cerebral arteries patent bilaterally. Both PCAs primarily supplied via the basilar and are well perfused or distal aspects. Small hypoplastic posterior communicating arteries noted bilaterally. Venous sinuses: Patent. Anatomic variants: None significant.  No aneurysm. Review of the MIP images confirms the above findings CT Brain Perfusion Findings: ASPECTS: 10 CBF (<30%) Volume: 19mL Perfusion (Tmax>6.0s) volume: 94mL Mismatch Volume: 75mL Infarction Location:Patchy acute core infarct seen involving the cortical and subcortical aspect of the mid and posterior left frontal region. Moderate sized surrounding ischemic penumbra. IMPRESSION: CTA HEAD AND NECK IMPRESSION: 1. Positive CTA for emergent large vessel occlusion, with occlusive thrombus involving the distal left M1 segment/left MCA bifurcation. Fairly robust perfusion within the distal left MCA branches distally, could  be related to subocclusive thrombus and/or collateralization. 2. 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 3. 50% stenosis at the  origin of the dominant right vertebral artery. 4. Hypoplastic left vertebral artery, occluded within the neck, with distal reconstitution by the skull base. 5.  Emphysema (ICD10-J43.9). CT PERFUSION IMPRESSION: 19 cc acute core infarct involving the mid and posterior left frontal region, left MCA distribution. 94 cc surrounding ischemic penumbra, mismatch volume 75 cc. Critical Value/emergent results were called by telephone at the time of interpretation on 10/02/2019 at 9:05 pm to provider Dr. Deretha Emory, who verbally acknowledged these results. Electronically Signed   By: Rise Mu M.D.   On: 10/02/2019 21:31   DG Chest Port 1 View  Result Date: 10/05/2019 CLINICAL DATA:  Acute respiratory failure with hypoxia. EXAM: PORTABLE CHEST 1 VIEW COMPARISON:  10/03/2019 FINDINGS: The endotracheal tube and NG tubes have been removed. The cardiac silhouette, mediastinal and hilar contours are normal. The lungs are clear. No pleural effusions. The bony thorax is intact. IMPRESSION: No acute cardiopulmonary findings. Electronically Signed   By: Rudie Meyer M.D.   On: 10/05/2019 08:26   DG Chest Port 1 View  Result Date: 10/03/2019 CLINICAL DATA:  54 year old female status post endovascular treatment of left MCA M1 emergent large vessel occlusion. EXAM: PORTABLE CHEST 1 VIEW COMPARISON:  CTA head and neck earlier tonight. FINDINGS: Portable AP supine view at 0141 hours. Endotracheal tube tip in good position between the level the clavicles and carina. Enteric tube courses to the stomach but the side hole is at the level of the distal esophagus. Normal cardiac size and mediastinal contours. Allowing for portable technique the lungs are clear. No acute osseous abnormality identified. Negative visible bowel gas pattern. IMPRESSION: 1. Advance the enteric tube 5 cm to ensure side hole placement within the stomach. 2. Endotracheal tube tip in good position. 3.  No acute cardiopulmonary abnormality. Electronically  Signed   By: Odessa Fleming M.D.   On: 10/03/2019 01:57   DG Abd Portable 1V  Result Date: 10/03/2019 CLINICAL DATA:  NG tube placement. EXAM: PORTABLE ABDOMEN - 1 VIEW COMPARISON:  Chest x-ray, same date. FINDINGS: The NG tube is in good position with its tip in the body region the stomach laterally. The proximal port is now well below the GE junction. Contrast noted in both renal collecting systems and ureters. There is also some contrast surrounding the right kidney suggesting a urinary leak or obstruction. Recommend clinical correlation. IMPRESSION: 1. NG tube in good position. 2. Contrast surrounding the right kidney suggesting a urinary leak or obstruction. Recommend clinical correlation. CT abdomen/pelvis may be helpful for further evaluation. Electronically Signed   By: Rudie Meyer M.D.   On: 10/03/2019 07:07   DG Swallowing Func-Speech Pathology  Result Date: 10/05/2019 Objective Swallowing Evaluation: Type of Study: MBS-Modified Barium Swallow Study  Patient Details Name: HARLENE PETRALIA MRN: [id number] Date of Birth: 1965-09-03 Today's Date: 10/05/2019 Time: SLP Start Time (ACUTE ONLY): 1323 -SLP Stop Time (ACUTE ONLY): 1334 SLP Time Calculation (min) (ACUTE ONLY): 11 min Past Medical History: No past medical history on file. Past Surgical History: Past Surgical History: Procedure Laterality Date . IR CT HEAD LTD  10/03/2019 . IR INTRA CRAN STENT  10/03/2019 . IR PATIENT EVAL TECH 0-60 MINS  10/03/2019 . IR PERCUTANEOUS ART THROMBECTOMY/INFUSION INTRACRANIAL INC DIAG ANGIO  10/03/2019 . RADIOLOGY WITH ANESTHESIA N/A 10/02/2019  Procedure: IR WITH ANESTHESIA;  Surgeon: Julieanne Cotton, MD;  Location: [facility] OR;  Service:  Radiology;  Laterality: N/A; HPI: 54 year old female with acute Left MCA M1 occlusion s/p tPA and mechanical thrombectomy returning to ICU. Intubated 3/8-3/10.  Subjective: alert, pleasant, not very communicative Assessment / Plan / Recommendation CHL IP CLINICAL IMPRESSIONS 10/05/2019 Clinical  Impression Pt demonstrates a primary oral dysphagia with mild right anterior labial weakness and decreased lingual strength. Pt has mild anterior bolus loss with thin via cup, better containment with a straw. Pt also has mild residue on the lingual body that spills to pharynx post swallow, resulting in some instances of trace spillage to pyrifomr sinuses or even very trace penetration, all sensed and cleared with a second swallow. Pt able to masticate soft solids and again clears residue with a second swallow. Recommend dys 3 mech soft given UE weakness and potential difficulty cutting and preparing foods. Thin liquids. SLP will f/u for tolerance.  SLP Visit Diagnosis Dysphagia, unspecified (R13.10) Attention and concentration deficit following -- Frontal lobe and executive function deficit following -- Impact on safety and function Mild aspiration risk   CH[facility] IP TREATMENT RECOMMENDATION 10/05/2019 Treatment Recommendations Therapy as outlined in treatment plan below   Prognosis 10/04/2019 Prognosis for Safe Diet Advancement Good Barriers to Reach Goals -- Barriers/Prognosis Comment -- [facility] IP DIET RECOMMENDATION 10/05/2019 SLP Diet Recommendations Dysphagia 3 (Mech soft) solids;Thin liquid Liquid Administration via Straw Medication Administration Whole meds with puree Compensations Slow rate;Small sips/bites Postural Changes Remain semi-upright after after feeds/meals (Comment);Seated upright at 90 degrees   CHL IP OTHER RECOMMENDATIONS 10/05/2019 Recommended Consults -- Oral Care Recommendations Oral care BID Other Recommendations --   CH[facility] IP FOLLOW UP RECOMMENDATIONS 10/05/2019 Follow up Recommendations Inpatient Rehab   CHL IP FREQUENCY AND DURATION 10/05/2019 Speech Therapy Frequency (ACUTE ONLY) min 2x/week Treatment Duration --      [facility] IP ORAL PHASE 10/05/2019 Oral Phase Impaired Oral - Pudding Teaspoon -- Oral - Pudding Cup -- Oral - Honey Teaspoon -- Oral - Honey Cup -- Oral - Nectar Teaspoon -- Oral - Nectar Cup  -- Oral - Nectar Straw -- Oral - Thin Teaspoon -- Oral - Thin Cup Right anterior bolus loss Oral - Thin Straw Weak lingual manipulation;Lingual/palatal residue Oral - Puree Lingual/palatal residue Oral - Mech Soft -- Oral - Regular Lingual/palatal residue Oral - Multi-Consistency -- Oral - Pill Decreased bolus cohesion Oral Phase - Comment --  CHL IP PHARYNGEAL PHASE 10/05/2019 Pharyngeal Phase WFL Pharyngeal- Pudding Teaspoon -- Pharyngeal -- Pharyngeal- Pudding Cup -- Pharyngeal -- Pharyngeal- Honey Teaspoon -- Pharyngeal -- Pharyngeal- Honey Cup -- Pharyngeal -- Pharyngeal- Nectar Teaspoon -- Pharyngeal -- Pharyngeal- Nectar Cup -- Pharyngeal -- Pharyngeal- Nectar Straw -- Pharyngeal -- Pharyngeal- Thin Teaspoon -- Pharyngeal -- Pharyngeal- Thin Cup -- Pharyngeal -- Pharyngeal- Thin Straw -- Pharyngeal -- Pharyngeal- Puree -- Pharyngeal -- Pharyngeal- Mechanical Soft -- Pharyngeal -- Pharyngeal- Regular -- Pharyngeal -- Pharyngeal- Multi-consistency -- Pharyngeal -- Pharyngeal- Pill -- Pharyngeal -- Pharyngeal Comment --  No flowsheet data found. Harlon Ditty, MA CCC-SLP Acute Rehabilitation Services Pager [phone] Office [phone] Claudine Mouton 10/05/2019, 1:42 PM              IR PATIENT EVAL TECH 0-60 MINS  Result Date: 10/03/2019 Carlyon Prows     10/03/2019  2:34 PM Right 8 french sheath removal, no hematoma present prior to sheath removal.  Held manual pressure with a Quikclot pad for 25 minutes.  No complications and distal pulses still present.  Verified site with RN and gave instructions on when to remove  Quikclot pad.  Lynann Beaver RT R, VI [place] RT R  ECHOCARDIOGRAM COMPLETE  Result Date: 10/03/2019    ECHOCARDIOGRAM REPORT   Patient Name:   NKECHI LINEHAN [facility] Date of Exam: 10/03/2019 Medical Rec #:  [id number]       Height:       65.0 in Accession #:    [id number]      Weight:       163.4 lb Date of Birth:  [birth year]      BSA:          1.815 m Patient Age:    53 years        BP:            116/62 mmHg Patient Gender: F               HR:           93 bpm. Exam Location:  Inpatient Procedure: 2D Echo, Cardiac Doppler and Color Doppler Indications:   CVA  History:       Patient has no prior history of Echocardiogram examinations.                Stroke.  Sonographer:   Thurman Coyer RDCS (AE) Referring      [id number] ASHISH ARORA Phys: IMPRESSIONS  1. Left ventricular ejection fraction, by estimation, is 60 to 65%. The left ventricle has normal function. The left ventricle has no regional wall motion abnormalities. Left ventricular diastolic parameters were normal.  2. Right ventricular systolic function is normal. The right ventricular size is normal.  3. The mitral valve is normal in structure. No evidence of mitral valve regurgitation. No evidence of mitral stenosis.  4. The aortic valve is normal in structure. Aortic valve regurgitation is not visualized. No aortic stenosis is present.  5. The inferior vena cava is normal in size with greater than 50% respiratory variability, suggesting right atrial pressure of 3 mmHg. Comparison(s): No prior Echocardiogram. FINDINGS  Left Ventricle: Left ventricular ejection fraction, by estimation, is 60 to 65%. The left ventricle has normal function. The left ventricle has no regional wall motion abnormalities. The left ventricular internal cavity size was normal in size. There is  no left ventricular hypertrophy. Left ventricular diastolic parameters were normal. Normal left ventricular filling pressure. Right Ventricle: The right ventricular size is normal. No increase in right ventricular wall thickness. Right ventricular systolic function is normal. Left Atrium: Left atrial size was normal in size. Right Atrium: Right atrial size was normal in size. Pericardium: There is no evidence of pericardial effusion. Mitral Valve: The mitral valve is normal in structure. Normal mobility of the mitral valve leaflets. No evidence of mitral valve regurgitation.  No evidence of mitral valve stenosis. Tricuspid Valve: The tricuspid valve is normal in structure. Tricuspid valve regurgitation is not demonstrated. No evidence of tricuspid stenosis. Aortic Valve: The aortic valve is normal in structure. Aortic valve regurgitation is not visualized. No aortic stenosis is present. Pulmonic Valve: The pulmonic valve was normal in structure. Pulmonic valve regurgitation is not visualized. No evidence of pulmonic stenosis. Aorta: The aortic root is normal in size and structure. Venous: The inferior vena cava is normal in size with greater than 50% respiratory variability, suggesting right atrial pressure of 3 mmHg. IAS/Shunts: No atrial level shunt detected by color flow Doppler.  LEFT VENTRICLE PLAX 2D LVIDd:         3.86 cm  Diastology LVIDs:  2.44 cm  LV e' lateral:   9.36 cm/s LV PW:         0.78 cm  LV E/e' lateral: 9.3 LV IVS:        0.90 cm  LV e' medial:    8.59 cm/s LVOT diam:     2.00 cm  LV E/e' medial:  10.1 LV SV:         76 LV SV Index:   42 LVOT Area:     3.14 cm  RIGHT VENTRICLE RV S prime:     15.80 cm/s TAPSE (M-mode): 2.5 cm LEFT ATRIUM             Index       RIGHT ATRIUM          Index LA diam:        3.20 cm 1.76 cm/m  RA Area:     9.71 cm LA Vol (A2C):   26.7 ml 14.71 ml/m RA Volume:   17.60 ml 9.70 ml/m LA Vol (A4C):   33.7 ml 18.57 ml/m LA Biplane Vol: 31.1 ml 17.13 ml/m  AORTIC VALVE LVOT Vmax:   110.00 cm/s LVOT Vmean:  74.700 cm/s LVOT VTI:    0.241 m  AORTA Ao Root diam: 2.60 cm MITRAL VALVE MV Area (PHT): 3.37 cm    SHUNTS MV Decel Time: 225 msec    Systemic VTI:  0.24 m MV E velocity: 86.90 cm/s  Systemic Diam: 2.00 cm MV A velocity: 88.00 cm/s MV E/A ratio:  0.99 Mihai Croitoru MD Electronically signed by Thurmon Fair MD Signature Date/Time: 10/03/2019/3:15:50 PM    Final    ECHO TEE  Result Date: 10/06/2019    TRANSESOPHOGEAL ECHO REPORT   Patient Name:   DEBE ANFINSON [facility] Date of Exam: 10/06/2019 Medical Rec #:  [id number]       Height:        65.0 in Accession #:    [id number]      Weight:       163.4 lb Date of Birth:  08/12/1965      BSA:          1.815 m Patient Age:    53 years        BP:           142/98 mmHg Patient Gender: F               HR:           53 bpm. Exam Location:  Inpatient Procedure: Transesophageal Echo, Cardiac Doppler, Color Doppler and Saline            Contrast Bubble Study Indications:     Stroke  History:         Patient has prior history of Echocardiogram examinations, most                  recent 10/03/2019. Stroke; Risk Factors:Dyslipidemia, Non-Smoker                  and Hypertension.  Sonographer:     Renella Cunas RDCS Referring Phys:  [id number] Corrin Parker Diagnosing Phys: Armanda Magic MD PROCEDURE: The transesophogeal probe was passed without difficulty through the esophogus of the patient. Sedation performed by performing physician. Patients was under conscious sedation during this procedure. Anesthetic administered: of Fentanyl, 4.0mg  of Versed. The patient developed no complications during the procedure. IMPRESSIONS  1. Left ventricular ejection fraction, by estimation, is 60 to 65%. The left ventricle  has normal function. The left ventricle has no regional wall motion abnormalities.  2. Right ventricular systolic function is normal. The right ventricular size is normal.  3. No left atrial/left atrial appendage thrombus was detected.  4. The mitral valve is normal in structure. Trivial mitral valve regurgitation.  5. The aortic valve is normal in structure. Aortic valve regurgitation is not visualized. 6. Trivial circumferential pericardial effusion. FINDINGS  Left Ventricle: Left ventricular ejection fraction, by estimation, is 60 to 65%. The left ventricle has normal function. The left ventricle has no regional wall motion abnormalities. The left ventricular internal cavity size was normal in size. There is  no left ventricular hypertrophy. Right Ventricle: The right ventricular size is normal. No  increase in right ventricular wall thickness. Right ventricular systolic function is normal. Left Atrium: Left atrial size was normal in size. No left atrial/left atrial appendage thrombus was detected. Right Atrium: Right atrial size was normal in size. Pericardium: A small pericardial effusion is present. The pericardial effusion is circumferential. Mitral Valve: The mitral valve is normal in structure. Trivial mitral valve regurgitation. Tricuspid Valve: The tricuspid valve is normal in structure. Tricuspid valve regurgitation is trivial. Aortic Valve: The aortic valve is normal in structure. Aortic valve regurgitation is not visualized. Pulmonic Valve: The pulmonic valve was normal in structure. Pulmonic valve regurgitation is trivial. Aorta: The aortic root and ascending aorta are structurally normal, with no evidence of dilitation. IAS/Shunts: The interatrial septum appears to be lipomatous. No atrial level shunt detected by color flow Doppler. Agitated saline contrast was given intravenously to evaluate for intracardiac shunting. Armanda Magic MD Electronically signed by Armanda Magic MD Signature Date/Time: 10/06/2019/5:47:47 PM    Final (Updated)    IR PERCUTANEOUS ART THROMBECTOMY/INFUSION INTRACRANIAL INC DIAG ANGIO  Result Date: 10/04/2019 INDICATION: New onset aphasia, right-sided paralysis, and left gaze deviation. 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 EXAM: 1. EMERGENT LARGE VESSEL OCCLUSION THROMBOLYSIS (anterior CIRCULATION) 2. Intracranial rescue stent placement COMPARISON:  CT angiogram of the head and neck of October 03, 2019. MEDICATIONS: Ancef 2 g IV antibiotic was administered within 1 hour of the procedure. ANESTHESIA/SEDATION: General anesthesia. CONTRAST:  Isovue 300 approximately 120 mL. FLUOROSCOPY TIME:  Fluoroscopy Time: 76 minutes 36 seconds (3040 mGy). COMPLICATIONS: None immediate. TECHNIQUE: Following a full explanation of the procedure  along with the potential associated complications, an informed witnessed consent was obtained patient's spouse. The risks of intracranial hemorrhage of 10%, worsening neurological deficit, ventilator dependency, death and inability to revascularize were all reviewed in detail with the patient's spouse. The patient was then put under general anesthesia by the Department of Anesthesiology at [facility]. The right groin was prepped and draped in the usual sterile fashion. Thereafter using modified Seldinger technique, transfemoral access into the right common femoral artery was obtained without difficulty. Over a 0.035 inch guidewire an 8 French 25 cm Pinnacle sheath was inserted. Through this, and also over a 0.035 inch guidewire a connection of a Berenstein 125 cm catheter inside of an 087 95 cm balloon guide catheter was advanced to the left common carotid artery. The guidewire, and the Berenstein support catheter were then removed. Good aspiration obtained from the hub of the balloon guide catheter. A gentle control arteriogram performed through balloon guide catheter demonstrated no evidence of spasms, dissections or of intraluminal filling defects. FINDINGS: However, the left common carotid arteriogram demonstrates the left external carotid artery and its major branches to be  widely patent. The left internal carotid artery at the bulb to the cranial skull base also demonstrates wide patency. The petrous, the cavernous and the supraclinoid segments are intact. The left anterior cerebral artery opacifies into the capillary and venous phases with prompt cross-filling of the right anterior cerebral A2 segment from the left internal carotid artery injection. The left middle cerebral artery demonstrates complete occlusion in its mid M1 segment. The delayed arterial phase demonstrates partial retrograde opacification of the anterior to middle perisylvian branches. PROCEDURE: Over a 0.014 inch standard Synchro  micro guidewire with a J configuration the combination of a Trevo ProVue 021 microcatheter inside of a 6 French 132 cm Catalyst guide catheter was advanced using biplane roadmap technique and constant fluoroscopic guidance to the supraclinoid left ICA. The left middle cerebral artery was then entered with the micro guidewire and into the superior division of the left middle cerebral artery followed by the microcatheter. The guidewire was removed. Good aspiration obtained from the hub of the microcatheter. A gentle control arteriogram performed through this demonstrated safe position of the tip of the microcatheter which was then connected to continuous heparinized saline infusion. The 6 [place] Catalyst guide catheter was then advanced into the distal left middle cerebral artery with near complete occlusion of the aspiration with a Penumbra aspiration device for approximately 2-1/2 minutes. A 4 mm x 40 mm Solitaire X retrieval device was then advanced to the distal end of the microcatheter and deployed with slight forward gentle traction with the right hand on the delivery micro guidewire, and retrieving the delivery microcatheter. Proximal flow arrest was then established by inflating the balloon of the balloon guide catheter in the mid left internal carotid artery. With constant aspiration applied at the hub of the 6 [place] Catalyst guide catheter, and with a 60 mL syringe at the hub of the balloon guide catheter, the combination of the retrieval device, and the microcatheter and the 6 [place] Catalyst catheter were retrieved and removed. Flow arrest was then reversed. A control arteriogram performed through the balloon guide catheter in the left internal carotid artery continued to demonstrate occluded left middle cerebral M1 segment. No evidence of clot was seen in the aspirate or entangled in the retrieval device. A second pass was then made again using the above combination. At this time access with the micro  guidewire and the microcatheter was obtained in the inferior division of the left middle cerebral artery M2 M3 region where the microcatheter was positioned. The guidewire was removed. Good aspiration obtained from the hub of the microcatheter. A gentle control arteriogram performed through the microcatheter demonstrates safe position of the tip of the microcatheter. A 6 mm x 40 mm Solitaire X retrieval device was then advanced to the distal end of the microcatheter. The O ring on the delivery microcatheter was loosened. With slight forward gentle traction with the right hand on the delivery micro guidewire, with the left hand the delivery microcatheter was retrieved deploying the retrieval device. The 6 [place] Catalyst guide was now engaged again in the occluded left middle cerebral artery distally to near complete occlusion to aspiration with the Penumbra aspiration device over 2-1/2 minutes. With proximal flow arrest and constant aspiration being applied at the hub of the balloon guide catheter in the left internal carotid artery, the combination was retrieved and removed. Again no evidence of a clot was seen in the aspirate or in the retrieval device. A control arteriogram performed through the balloon guide catheter in the  left internal carotid artery again demonstrated no change in the occluded left middle cerebral artery M1 segment. A third pass was then made using a large-bore catheter inside of which was the 021 Trevo microcatheter again advanced as the combination over a 0.14 inch standard Synchro micro guidewire to the supraclinoid left ICA. Again the micro guidewire was then gently manipulated with a torque device and this time advanced into the middle branch of the left MCA trifurcation between the inferior and the superior divisions. This is then followed by the microcatheter into the M2 M3 region. The guidewire was removed. Good aspiration obtained from the hub of the microcatheter. A gentle control  arteriogram performed through the microcatheter demonstrated safe position of the tip of the microcatheter which was then connected to continuous heparinized saline infusion. A 4 mm x 40 mm Solitaire X retrieval device was again advanced to the distal end of the microcatheter. This was again deployed in the usual manner. A gentle control arteriogram performed through the 6 French large-bore catheter in the proximal left MCA now demonstrated a TICI 2c revascularization with an overlying severe atherosclerotic stenosis at the origin of the middle branch and extending into the origin of the inferior division. Again with proximal flow arrest in the left internal carotid artery, and constant aspiration with the Penumbra aspirate device with the large-bore catheter embedded in the occluded distal left MCA over 2-1/2 minutes the combination was retrieved and removed. Again no evidence of clot was found in the retrieval device, or aspirate. A control arteriogram performed following flow reversal in the left internal carotid artery demonstrated angiographically occluded left middle cerebral artery. It was felt overlying pathology was related to an underlying atherosclerotic plaque causing significant stenosis. Following discussion with the referring neuro hospitalist, it was decided to proceed with rescue stenting. At this time, the patient was loaded with Brilinta 180 mg, and aspirin 81 mg via an orogastric tube. A combination of a Headway 17 2 tip microcatheter inside of a 014 inch standard Synchro micro guidewire inside of a large-bore support catheter was then advanced as mentioned earlier to the supraclinoid left ICA. The micro guidewire was then gently manipulated with a torque device and advanced without difficulty into the inferior division of the left MCA trifurcation followed by the microcatheter. The guidewire was removed. Good aspiration obtained from the hub of the Headway microcatheter. A gentle control  arteriogram performed through microcatheter demonstrated safe position of the tip of the microcatheter. A 4 mm x 24 mm Atlas stent delivery system was then advanced to the distal end of the microcatheter, the delivery microcatheter was then positioned such that the proximal portion of the landing zone stent would be just inside the left middle cerebral artery origin. The O ring on the delivery microcatheter was loosened. With slight forward gentle traction with the right hand on the delivery micro guidewire with the left hand the delivery microcatheter was retrieved unsheathing the distal and the proximal portion of the stent in excellent position. The tines of the stent opened completely proximally and distally. A control arteriogram performed through the large bore catheter demonstrated excellent revascularization of the inferior division into M3 M4 branches. The middle branch of the trifurcation remained occluded. The micro guidewire was reintroduced into the microcatheter and advanced without difficulty into the proximal portion of the deployed stent. Using a torque device, access was obtained into the middle branch without difficulty followed by the microcatheter was advanced to the M2 M3 region. The guidewire was  removed. Good aspiration obtained from the hub of the microcatheter which was then connected to continuous heparinized saline infusion. Again another 4 mm x 24 mm Atlas Neuroform stent was advanced to the distal end of the microcatheter. This was again deployed such that the proximal portion of the stent was in the proximal portion of the left MCA. Following deployment, a control arteriogram performed through the large bore catheter in the left middle cerebral artery demonstrated a TICI 2c revascularization. A control arteriogram performed approximately 5 minutes later demonstrated progressive slow flow in the middle branch of the stented segment. This prompted the use of approximately 6 mg of  intra-arterial Integrilin for 10 minutes. A control arteriogram performed approximately 10 minutes later demonstrated slow revascularization of this stented branch. It was, therefore, decided to stop at this juncture. The large-bore catheter and the balloon guide catheter were retrieved and removed. A CT of the brain demonstrated no evidence of a gross hemorrhage, mass effect or midline shift. There was dense contrast staining in the basal ganglia region. Following this it was decided to proceed with a slow IV infusion of aggrastat and half the minimal dose run as an infusion over approximately 8-12 hours in order to protect the stented segments of the left middle cerebral artery. Patient was left intubated on account of the procedure, and also patient had received IV tPA and multiple dual antiplatelets. Additionally, the 8 French Pinnacle sheath in the right groin was left and connected to continuous heparinized saline infusion. Prior to the placement of rescue stents at the superior and inferior divisions of the left middle cerebral artery, a CT was performed of the brain on the table. This continued to demonstrate no evidence of a gross hemorrhage, or mass effect or midline shift. Contrast stain was seen in the basal ganglia region. Distal pulses remained palpable in the dorsalis pedis, and the posterior tibial regions bilaterally. The patient was then transferred intubated to the neuro ICU to continue with post revascularization care. IMPRESSION: Status post endovascular revascularization of occluded left middle cerebral artery M1 segment, with 2 passes with the Solitaire 4 mm x 40 mm X retrieval device, 1 pass with the 6 mm x 40 mm Solitaire X retrieval device, with Penumbra aspiration, and placement of a rescue stents in the superior and inferior divisions of the left middle cerebral artery achieving a TICI 2b to TICI 2c revascularization. PLAN: Follow-up in the clinic 4 weeks post discharge. Electronically  Signed   By: Julieanne Cotton M.D.   On: 10/03/2019 09:56   CT HEAD CODE STROKE WO CONTRAST  Result Date: 10/09/2019 CLINICAL DATA:  Code stroke. Right-sided weakness and slurred speech with right facial droop. EXAM: CT HEAD WITHOUT CONTRAST TECHNIQUE: Contiguous axial images were obtained from the base of the skull through the vertex without intravenous contrast. COMPARISON:  MRI 6 days ago FINDINGS: Brain: Subacute infarct in the left basal ganglia and frontal white matter, similar in extent to interval brain MRI. There is interval cortical infarct in the superficial and inferior left temporal lobe. No hemorrhagic conversion or midline shift. Vascular: Left MCA Y stent.  No hyperdense vessel seen elsewhere. Skull: Negative Sinuses/Orbits: Negative Other: A call has been placed to Dr. Amada Jupiter. ASPECTS [facility] Stroke Program Early CT Score) Not scored in this setting IMPRESSION: 1. Interval infarction in the left temporal cortex. 2. 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 No hemorrhagic conversion. Electronically Signed   By: Kathrynn Ducking.D.  On: 10/09/2019 09:42   CT HEAD CODE STROKE WO CONTRAST  Result Date: 10/02/2019 CLINICAL DATA:  Code stroke. 54 year old female with severe headache and right side weakness since 1750 hours. EXAM: CT HEAD WITHOUT CONTRAST TECHNIQUE: Contiguous axial images were obtained from the base of the skull through the vertex without intravenous contrast. COMPARISON:  None. FINDINGS: Brain: No midline shift, mass effect, or evidence of intracranial mass lesion. No ventriculomegaly. No acute intracranial hemorrhage identified. No cortically based acute infarct identified. Chronic appearing white matter hypodensity at the anterior left corona radiata on series 2, image 16. Elsewhere gray-white matter differentiation is within normal limits. Vascular: No suspicious intracranial vascular hyperdensity. Skull: Negative.  Sinuses/Orbits: Clear aside from some right posterior ethmoid air cell mucosal thickening. Mastoids and tympanic cavities appear clear. Other: Visualized orbits and scalp soft tissues are within normal limits. ASPECTS [facility] Stroke Program Early CT Score) Total score (0-10 with 10 being normal): 10 IMPRESSION: 1. No acute cortically based infarct or acute intracranial hemorrhage identified. ASPECTS 10. 2. 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 Study discussed by telephone with Dr. Vanetta Mulders on 10/02/2019 at 20:18 . Electronically Signed   By: Odessa Fleming M.D.   On: 10/02/2019 20:20   VAS [place] LOWER EXTREMITY VENOUS (DVT)  Result Date: 10/04/2019  Lower Venous DVTStudy Indications: Stroke.  Comparison Study: No prior exam. Performing Technologist: Kennedy Bucker ARDMS, RVT  Examination Guidelines: A complete evaluation includes B-mode imaging, spectral Doppler, color Doppler, and power Doppler as needed of all accessible portions of each vessel. Bilateral testing is considered an integral part of a complete examination. Limited examinations for reoccurring indications may be performed as noted. The reflux portion of the exam is performed with the patient in reverse Trendelenburg.  +---------+---------------+---------+-----------+----------+-------------------+ RIGHT    CompressibilityPhasicitySpontaneityPropertiesThrombus Aging      +---------+---------------+---------+-----------+----------+-------------------+ CFV      Full           Yes      Yes                                      +---------+---------------+---------+-----------+----------+-------------------+ SFJ      Full                                                             +---------+---------------+---------+-----------+----------+-------------------+ FV Prox  Full                                                              +---------+---------------+---------+-----------+----------+-------------------+ FV Mid   Full                                                             +---------+---------------+---------+-----------+----------+-------------------+ FV DistalFull                                                             +---------+---------------+---------+-----------+----------+-------------------+  PFV                                                   not visualized due                                                        to bandage          +---------+---------------+---------+-----------+----------+-------------------+ POP      Full           Yes      Yes                                      +---------+---------------+---------+-----------+----------+-------------------+ PTV      Full                                                             +---------+---------------+---------+-----------+----------+-------------------+ PERO     Full                                                             +---------+---------------+---------+-----------+----------+-------------------+   +---------+---------------+---------+-----------+----------+--------------+ LEFT     CompressibilityPhasicitySpontaneityPropertiesThrombus Aging +---------+---------------+---------+-----------+----------+--------------+ CFV      Full           Yes      Yes                                 +---------+---------------+---------+-----------+----------+--------------+ SFJ      Full                                                        +---------+---------------+---------+-----------+----------+--------------+ FV Prox  Full                                                        +---------+---------------+---------+-----------+----------+--------------+ FV Mid   Full                                                         +---------+---------------+---------+-----------+----------+--------------+ FV DistalFull                                                        +---------+---------------+---------+-----------+----------+--------------+  PFV      Full                                                        +---------+---------------+---------+-----------+----------+--------------+ POP      Full           Yes      Yes                                 +---------+---------------+---------+-----------+----------+--------------+ PTV      Full                                                        +---------+---------------+---------+-----------+----------+--------------+ PERO     Full                                                        +---------+---------------+---------+-----------+----------+--------------+     Summary: BILATERAL: - No evidence of deep vein thrombosis seen in the lower extremities, bilaterally.  RIGHT: - No cystic structure found in the popliteal fossa.  LEFT: - No cystic structure found in the popliteal fossa.  *See table(s) above for measurements and observations. Electronically signed by Gretta Beganodd Early MD on 10/04/2019 at 2:47:34 PM.    Final     Labs:  Basic Metabolic Panel: Recent Labs  Lab 10/11/19 2208 10/12/19 0537  NA 143 141  K 3.9 3.7  CL 111 109  CO2 21* 22  GLUCOSE 103* 87  BUN 20 19  CREATININE 0.73 0.83  CALCIUM 9.2 9.0    CBC: Recent Labs  Lab 10/11/19 2208 10/12/19 0537  WBC 10.3 8.6  NEUTROABS 6.6 4.7  HGB 10.9* 11.0*  HCT 32.9* 33.8*  MCV 92.4 93.9  PLT 444* 474*    CBG: No results for input(s): GLUCAP in the last 168 hours.  Family history.  Mother and father with hypertension and hyperlipidemia.  Denies any diabetes mellitus esophageal cancer rectal cancer  Brief HPI:   Lisseth D Ronalee RedMichaux is a 54 y.o. right-handed female with history of migraine headaches.  Lives with spouse independent prior to admission.  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  Presented 10/09/2019 with increased right-sided weakness.  CT and MRI showed acute infarction left MCA territory.  New area of acute infarction left superior temporal lobe compared to prior MRI of 10/03/2019.  Abnormal signal in the left MCA branches compatible with slow flow or occlusion.  CT angiogram of head and neck recent Y stenting of left M1 and M2 segments.  No new site of embolic disease.  Most recent echocardiogram ejection fraction of 65%.  Neurology consulted maintain on aspirin 325 mg daily as well as the addition of Brilinta.  Subcutaneous Lovenox for DVT prophylaxis.  Close monitoring of blood pressure with ProAmatine due to bouts of orthostasis.  Patient was admitted for a comprehensive  rehab program   Hospital Course: GAGANDEEP KOSSMAN was admitted to rehab 10/11/2019 for inpatient therapies to consist of PT, ST and OT at least three hours five days a week. Past admission physiatrist, therapy team and rehab RN have worked together to provide customized collaborative inpatient rehab.  Pertaining to patient's left temporal lobe infarction status post revascularization maintain on aspirin and Plavix therapy she would follow neurology services.  Subcutaneous Lovenox for DVT prophylaxis throughout her hospital stay no bleeding episodes.  Mood stabilization with amantadine and later discontinued.  Bouts of orthostasis controlled with the addition of ProAmatine she would need follow-up outpatient.  Lipitor for hyperlipidemia.   Blood pressures were monitored on TID basis and soft and controlled  She is continent of bowel and bladder.  Francis Dowse has made gains during rehab stay and is attending therapies  She will continue to receive follow up therapies   after discharge  Rehab course: During patient's stay in rehab weekly team conferences were held to monitor patient's  progress, set goals and discuss barriers to discharge. At admission, patient required max assist ambulate 14 feet to person hand-held assist moderate assist sit to stand.  Moderate assist upper body bathing max assist lower body bathing mod assist upper body dressing max assist lower body dressing.    Physical exam.  Blood pressure 128/60 pulse 70 respirations 18 oxygen saturation is 96% room air Constitutional.  Alert no acute distress HEENT Head.  Normocephalic and atraumatic Neck.  Supple nontender no JVD without thyromegaly Cardiac regular rate rhythm without any extra sounds or murmur heard Abdomen.  Soft nontender positive bowel sounds without rebound Respiratory effort normal no respiratory distress without wheeze Musculoskeletal.  Normal range of motion Comments right upper extremity deltoids biceps triceps wrist extension grip and finger flexion abduction 0 out of 5 Left upper extremity 5 out of 5 Right lower extremity hip flexors knee extension knee flexion dorsi plantar flexion 3 out of 5 Left lower extremity 5 out of 5  She  has had improvement in activity tolerance, balance, postural control as well as ability to compensate for deficits. He/She has had improvement in functional use RUE/LUE  and RLE/LLE as well as improvement in awareness.  Ambulating 100 feet rolling walker right AFO minimal assistance.  Negotiates 3 steps 2 trials rolling walker minimal assist.  Patient with cueing for compensatory pattern assistance with balance right side weakness and lean to the right.  Daughter practiced toilet transfers and tub transfers in a simulated home environment with moderate assistance.  She needs some assistance for lower body upper body dressing.  Speech therapy follow-up for cognitive linguistic goals.  Patient max assist semantic cues for recall during a novel card task.  Moderate assist verbal cues were also required for use of word finding strategies for generative naming within the  novel card task.  Mod max assist verbal cues required for emergent awareness in regards to difficulty of task.  Family and patient remained very focused on discharge to home quite adamant about plan with full family teaching completed discharged 10/17/2019 with recommendations for supervision for her safety       Disposition: Discharge to home    Diet: Regular  Special Instructions: No driving smoking or alcohol  Medications at discharge. 1.  Tylenol as needed 2.  Aspirin 325 mg p.o. daily 3.  Lipitor 80 mg p.o. daily 4.  ProAmatine 10 mg p.o. 3 times daily 5.  Brilinta 90 mg twice daily  Discharge Instructions  Ambulatory referral to Neurology   Complete by: As directed    An appointment is requested in approximately 4 weeks left temporal lobe infarction      Follow-up Information    Ranelle Oyster, MD Follow up.   Specialty: Physical Medicine and Rehabilitation Why: Office to call for appointment Contact information: [address] [place] [address] [phone]        Julieanne Cotton, MD Follow up.   Specialties: Interventional Radiology, Radiology Why: Call for appointment Contact information: [address] [place] [address] [phone]        Hillis Range, MD Follow up.   Specialty: Cardiology Why: Call for appointment Contact information: [address] [place] [address] [phone]           Signed: Charlton Amor 10/17/2019, 11:39 AM

## 2019-10-18 ENCOUNTER — Telehealth: Payer: Self-pay | Admitting: Registered Nurse

## 2019-10-18 NOTE — Progress Notes (Signed)
Speech Language Pathology Discharge Summary  Patient Details  Name: Erin Peck MRN: [id number] Date of Birth: June 02, 1966  Patient has met 0 of 5 long term goals.  Patient to discharge at overall Mod level.   Reasons goals not met: Patient and family insisting on going home and not finishing rehabilitation program despite encouragement and recommendations from therapy team.   Clinical Impression/Discharge Summary: Patient has made minimal progress and has not met any LTGs this admission. Currently, patient is consuming regular textures with thin liquids with minimal overt s/s of aspiration and full supervision to maximize safety with PO intake. Patient continues to demonstrate decreased word-finding and speech intelligibility impacting functional communication and requires overall Mod A multimodal cues for use of compensatory strategies. Patient also continues to require overall Min-Mod A multimodal cues for basic problem solving, recall and emergent awareness. Patient and family insisting on going home and not finishing rehabilitation program despite encouragement and recommendation from therapy team. Therefore, patient will discharge home with 24 hour supervision. Patient would benefit from f/u home health SLP services to maximize her cognitive-linguistic, speech and swallowing function.  Care Partner:  Caregiver Able to Provide Assistance: Yes  Type of Caregiver Assistance: Physical;Cognitive  Recommendation:  24 hour supervision/assistance;Home Health SLP  Rationale for SLP Follow Up: Maximize functional communication;Maximize cognitive function and independence;Maximize swallowing safety;Reduce caregiver burden   Equipment: N/A   Reasons for discharge: Other (comment)(Patient and family requested to leave early and did not want to complete reab program)   Patient/Family Agrees with Progress Made and Goals Achieved: Yes    Yishai Rehfeld 10/18/2019, 6:34 AM

## 2019-10-18 NOTE — Telephone Encounter (Signed)
Transitional Care call Transitional Questions Answered by Daughter Ms. [name]  Patient name: Erin Peck DOB: [birth year] 1. Are you/is patient experiencing any problems since coming home? No a. Are there any questions regarding any aspect of care? No 2. Are there any questions regarding medications administration/dosing? No a. Are meds being taken as prescribed? Yes b. "Patient should review meds with caller to confirm" Medication List Reviewed 3. Have there been any falls? No 4. Has Home Health been to the house and/or have they contacted you? Yes, [facility] a. If not, have you tried to contact them? NA b. Can we help you contact them? NA 5. Are bowels and bladder emptying properly? Yes a. Are there any unexpected incontinence issues? No b. If applicable, is patient following bowel/bladder programs? NA 6. Any fevers, problems with breathing, unexpected pain? No 7. Are there any skin problems or new areas of breakdown? No 8. Has the patient/family member arranged specialty MD follow up (ie cardiology/neurology/renal/surgical/etc.)?  Ms. Deloria Lair is in the process of scheduling HFU appointments, she reports. a. Can we help arrange? No 9. Does the patient need any other services or support that we can help arrange? No 10. Are caregivers following through as expected in assisting the patient? Yes 11. Has the patient quit smoking, drinking alcohol, or using drugs as recommended? (                        )  Appointment date/time 10/30/2019  arrival time 11:00 for 11:20 appointment with Jones Bales ANP-C. At [address] [facility] [address]

## 2019-10-19 NOTE — Progress Notes (Signed)
Social Work Assessment and Plan   Patient Details  Name: Erin Peck MRN: [id number] Date of Birth: 10/27/1965  Today's Date: 10/19/2019  Problem List:  Patient Active Problem List   Diagnosis Date Noted  . Left middle cerebral artery stroke ([place]) 10/11/2019  . Aortic atherosclerosis ([place]) 10/10/2019  . CVA (cerebral vascular accident) ([place]) 10/09/2019  . Hypotension 10/06/2019  . Accelerated hypertension 10/06/2019  . Hyperlipidemia 10/06/2019  . 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 Hypoglycemia 10/06/2019  . Family hx-stroke 10/06/2019  . Complicated migraine 52/67/5916  . Acute blood loss anemia 10/06/2019  . Hypokalemia 10/06/2019  . Stroke due to occlusion of left middle cerebral artery ([place]) 10/03/2019  . Acute ischemic stroke ([place]) 10/02/2019   Past Medical History:  Past Medical History:  Diagnosis Date  . Migraines   . Stroke (cerebrum) [facility])    Past Surgical History:  Past Surgical History:  Procedure Laterality Date  . BUBBLE STUDY  10/06/2019   Procedure: BUBBLE STUDY;  Surgeon: Sueanne Margarita, MD;  Location: [facility];  Service: Cardiovascular;;  . IR CT HEAD LTD  10/03/2019  . IR INTRA CRAN STENT  10/03/2019  . IR PATIENT EVAL TECH 0-60 MINS  10/03/2019  . IR PERCUTANEOUS ART THROMBECTOMY/INFUSION INTRACRANIAL INC DIAG ANGIO  10/03/2019  . RADIOLOGY WITH ANESTHESIA N/A 10/02/2019   Procedure: IR WITH ANESTHESIA;  Surgeon: Luanne Bras, MD;  Location: [facility];  Service: Radiology;  Laterality: N/A;  . TEE WITHOUT CARDIOVERSION N/A 10/06/2019   Procedure: TRANSESOPHAGEAL ECHOCARDIOGRAM (TEE);  Surgeon: Sueanne Margarita, MD;  Location: [facility] ENDOSCOPY;  Service: Cardiovascular;  Laterality: N/A;   Social History:  reports that she has never smoked. She has never used smokeless tobacco. She reports previous alcohol use. She reports previous drug use.  Family / Support Systems Marital Status: Married How Long?: 32 years Patient Roles:  Spouse Spouse/Significant Other: Patrick Jupiter : [phone] Children: Caroline More (daughter): [phone] Other Supports: Various family members Anticipated Caregiver: Pt primary caregiver will be her dtr Rhae Lerner and husband Ability/Limitations of Caregiver: None reported Caregiver Availability: 24/7 Family Dynamics: Pt lives in the home with her husband. Pt watches her 2 y.o. 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 Recent Psychosocial Issues: Denies Psychiatric History: Denies Substance Abuse History: Denies  Patient / Family Perceptions, Expectations & Goals Pt/Family understanding of illness & functional limitations: Pt and pt family appear to have general understanding of pt condition. Premorbid pt/family roles/activities: Pt was independent Anticipated changes in roles/activities/participation: Pt will require assistance with ADLs/IADLs Pt/family expectations/goals: "get better."  [employer]: None Premorbid Home Care/DME Agencies: None Resource referrals recommended: Neuropsychology  Discharge Planning Living Arrangements: Spouse/significant other, Children Support Systems: Spouse/significant other, Children, Other relatives Type of Residence: Private residence Insurance Resources: Teacher, adult education Resources: Other (Comment) Financial Screen Referred: No Living Expenses: Own Money Management:  Patient Does the patient have any problems obtaining your medications?: No Home Management: Pt managed finances Patient/Family Preliminary Plans: Pt dtr [name] to assist with managing finances; has already assumed role Sw Barriers to Discharge: Other (comments) Sw Barriers to Discharge Comments: Pt  is uninsured. POssible issues with medications at d/c as pt was recently d/c under [facility] program (within last week). Social Work Anticipated Follow Up Needs: HH/OP  Clinical Impression SW met with pt and pt dtr [name] in room to introduce self, explain role, and discuss discharge process. Pt admits she quit smoking 2 weeks ago (since stroke). Was smoking 1pk per day. DME: RW from previous admission.   Rana Snare 10/19/2019, 4:41 PM

## 2019-10-20 ENCOUNTER — Telehealth: Payer: Self-pay

## 2019-10-20 NOTE — Telephone Encounter (Signed)
SW spoke with pt dtr [name] [phone]) who reported concerns about Rx pickup. SW spoke with Daniel/Pharmacist at [facility] ([phone]/f:[phone]) who stated the [facility] card was rejected. SW spoke with [facility] pharmacy [phone]) who reported she would look into matter. She called SW back to inform pt in system.  Conference call with Herbert Seta and Reuel Boom indicated that pt is in the system but continues to be rejected. SW encouraged to follow up with [facility] sup Alinda Money [phone]). SW spoke with Alinda Money who will look further into matter. Alinda Money followed up with SW to inform the [facility] card will not work because the patient has already been given all of the medications (Midodrine, Atorvastatin, and Brilinta). States Midrodrine and Atorvastatin were billed on 3/14 and Brilinta billed on 3/17. SW asked if pt has not picked up these medications from the pharmacy, SW would need to ask the pharmacy to correct. SW spoke with Daniel/[facility] pharmacy again to discuss above. He confirms all 3 prescriptions were filled and picked up on dates billed. States he will use a discount card for medications and call the family and ask what they would like to do.   SW called pt dtr [name] and left a message informing that pt would not be able to use [facility] card due to Rx being filled and picked up already. SW informed on addtl option to use discount Rx card provided by pharmacy to obtain these medications. SW encouraged follow-up if needed.

## 2019-10-23 ENCOUNTER — Encounter (INDEPENDENT_AMBULATORY_CARE_PROVIDER_SITE_OTHER): Payer: Self-pay | Admitting: Primary Care

## 2019-10-23 ENCOUNTER — Other Ambulatory Visit: Payer: Self-pay

## 2019-10-23 ENCOUNTER — Ambulatory Visit (INDEPENDENT_AMBULATORY_CARE_PROVIDER_SITE_OTHER): Payer: Self-pay | Admitting: Primary Care

## 2019-10-23 VITALS — BP 114/71 | HR 62 | Temp 97.3°F | Ht 65.0 in | Wt 229.4 lb

## 2019-10-23 DIAGNOSIS — Z09 Encounter for follow-up examination after completed treatment for conditions other than malignant neoplasm: Secondary | ICD-10-CM

## 2019-10-23 DIAGNOSIS — I63512 Cerebral infarction due to unspecified occlusion or stenosis of left middle cerebral artery: Secondary | ICD-10-CM

## 2019-10-23 DIAGNOSIS — E66812 Obesity, class 2: Secondary | ICD-10-CM

## 2019-10-23 DIAGNOSIS — Z7689 Persons encountering health services in other specified circumstances: Secondary | ICD-10-CM

## 2019-10-23 NOTE — Progress Notes (Signed)
History and Physical   History and Physical:    Erin Peck   ZOX:[id number] DOB: June 22, 1966 DOA: (Not on file)  PCP: Kerin Perna, NP   Chief Complaint: Hospital discharge and establishment of care.  History of Present Illness:   Erin Peck is an 54 y.o. 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 She is in a wheel chair needing assistance with transfers, ambulation, all ADL's and IADl's. She has right sided weakness with her arm and hands no ROM.  ROS:   Constitutional: No fever, no chills;  Appetite normal; No weight loss, no weight gain, no fatigue.   HEENT: No blurry vision, no diplopia, no pharyngitis, no dysphagia  CV: No chest pain, no palpitations, no PND, no orthopnea, no edema.   Resp: No SOB, no cough, no pleuritic pain.  GI: No nausea, no vomiting, no diarrhea, no melena, no hematochezia, no constipation, no abdominal pain.   Genitourinary:       Immediate assistance with bowel and bladder but she is aware when she needs to use the bathroom  Musculoskeletal: Positive for falls.       Increase risk of falls needs assistance with all care and transfers  All other systems reviewed and are negative.No dysuria, no hematuria, no frequency, no urgency.  MSK: No myalgias, no arthralgias.   Neuro:  No headache, no focal neurological deficits, no history of seizures.   Psych: No depression, no anxiety.   Endo: No heat intolerance, no cold intolerance, no polyuria, no polydipsia   Skin: No rashes, no skin lesions.   Heme: No easy bruising.   Travel history: No recent travel.   Past Medical History:   Past Medical History:  Diagnosis Date  . Migraines   . Stroke (cerebrum) [facility])     Past  Surgical History:   Past Surgical History:  Procedure Laterality Date  . BUBBLE STUDY  10/06/2019   Procedure: BUBBLE STUDY;  Surgeon: Sueanne Margarita, MD;  Location: [place];  Service: Cardiovascular;;  . IR CT HEAD LTD  10/03/2019  . IR INTRA CRAN STENT  10/03/2019  . IR PATIENT EVAL TECH 0-60 MINS  10/03/2019  . IR PERCUTANEOUS ART THROMBECTOMY/INFUSION INTRACRANIAL INC DIAG ANGIO  10/03/2019  . RADIOLOGY WITH ANESTHESIA N/A 10/02/2019   Procedure: IR WITH ANESTHESIA;  Surgeon: Luanne Bras, MD;  Location: [place];  Service: Radiology;  Laterality: N/A;  . TEE WITHOUT CARDIOVERSION N/A 10/06/2019   Procedure: TRANSESOPHAGEAL ECHOCARDIOGRAM (TEE);  Surgeon: Sueanne Margarita, MD;  Location: [facility] ENDOSCOPY;  Service: Cardiovascular;  Laterality: N/A;    Social History:   Social History   Socioeconomic History  . Marital status: Married    Spouse name: Not on file  . Number of children: Not on file  . Years of education: Not on file  . Highest education level: Not on file  Occupational History  . Not on file  Tobacco Use  . Smoking status: Never Smoker  . Smokeless tobacco: Never Used  Substance and Sexual Activity  . Alcohol use: Not Currently  . Drug use: Not Currently  . Sexual activity: Not on file  Other Topics Concern  . Not on file  Social History Narrative  . Not on file  Social Determinants of Health   Financial Resource Strain:   . Difficulty of Paying Living Expenses:   Food Insecurity:   . Worried About Programme researcher, broadcasting/film/video in the Last Year:   . Barista in the Last Year:   Transportation Needs:   . Freight forwarder (Medical):   Marland Kitchen Lack of Transportation (Non-Medical):   Physical Activity:   . Days of Exercise per Week:   . Minutes of Exercise per Session:   Stress:   . Feeling of Stress :   Social Connections:   . Frequency of Communication with Friends and Family:   . Frequency of Social Gatherings with Friends and Family:   . Attends  Religious Services:   . Active Member of Clubs or Organizations:   . Attends Banker Meetings:   Marland Kitchen Marital Status:   Intimate Partner Violence:   . Fear of Current or Ex-Partner:   . Emotionally Abused:   Marland Kitchen Physically Abused:   . Sexually Abused:    Ambulatory status: none Family history:   No family history on file.   Allergies   Patient has no known allergies.  Current Medications:   Prior to Admission medications   Medication Sig Start Date End Date Taking? Authorizing Provider  acetaminophen (TYLENOL) 325 MG tablet Take 2 tablets (650 mg total) by mouth every 4 (four) hours as needed for mild pain (or temp > 37.5 C (99.5 F)). 10/17/19  Yes Angiulli, Mcarthur Rossetti, PA-C  aspirin EC 325 MG EC tablet Take 1 tablet (325 mg total) by mouth daily. 10/08/19  Yes Rinehuls, Kinnie Scales, PA-C  atorvastatin (LIPITOR) 80 MG tablet Take 1 tablet (80 mg total) by mouth daily at 6 PM. 10/17/19  Yes Angiulli, Mcarthur Rossetti, PA-C  midodrine (PROAMATINE) 5 MG tablet Take 2 tablets (10 mg total) by mouth 3 (three) times daily with meals. 10/17/19  Yes Angiulli, Mcarthur Rossetti, PA-C  ticagrelor (BRILINTA) 90 MG TABS tablet Take 1 tablet (90 mg total) by mouth 2 (two) times daily. 10/17/19  Yes AngiulliMcarthur Rossetti, PA-C    Vitals:   10/23/19 1355  BP: 114/71  Pulse: 62  Temp: (!) 97.3 F (36.3 C)  TempSrc: Temporal  SpO2: 96%  Weight: 229 lb 6.4 oz (104.1 kg)  Height: 5\' 5"  (1.651 m)     Physical Exam: Blood pressure 114/71, pulse 62, temperature (!) 97.3 F (36.3 C), temperature source Temporal, height 5\' 5"  (1.651 m), weight 229 lb 6.4 oz (104.1 kg), SpO2 96 %. Gen: No acute distress. Head: mild facial drooping with tongue deviation to right  Eyes: PERRL, EOMI, sclerae nonicteric. Neck: Supple, no thyromegaly, no lymphadenopathy, no jugular venous distention. Chest: CTA  CV: RRR, no murmurs   Abdomen: Soft, nontender, nondistended with normal active bowel sounds. Extremities: right side  weakness and arm flaccid Skin: Warm and dry. Neuro: Alert and oriented times 3 Psych: Mood and affect normal.   Data Review:  Encounters. Labs , imaging    Assessment/Plan:   Zaineb was seen today for hospitalization follow-up.  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 Discharge on aspirin and Brilinta per neurology for antiplatelet therap-    Encounter to establish care , NP-C will be your  (PCP) she is mastered prepared . Able to diagnosed and treatment also  answer health  concern as well as continuing care of varied medical conditions, not limited by cause, organ system, or diagnosis.    Hospital discharge follow-up  Presented to the emergency room with increase weakness evaluated and she was Admit date: 10/11/2019 and Discharge date: 10/17/2019 new diagnosis acute infarction left superior temporal lobe. During this course of admission she received PT, OT and ST at least three hours five days a week. Recommendations for supervision for her safety. Follow up with Dr. Riley Kill  Physical Medicine and Rehabilitation.Dr. Shan Levans Interventional Radiology, Radiology   Class 2 severe obesity due to excess calories with serious comorbidity in adult, unspecified BMI (HCC) Obesity is 30-39 indicating an excess in caloric intake or underlining conditions. This may lead to other co-morbidities. Lifestyle modifications of diet and exercise may reduce obesity. Due to limitation physical therapy and occupational therapy will be the limit of exercising

## 2019-10-23 NOTE — Patient Instructions (Signed)
Cognitive Rehabilitation After a Stroke After a stroke, you may have various problems with thinking (cognitive disability). The types of problems you have will depend on how severe the stroke was and where it was located in the brain. Problems may include:  Problems with short-term memory.  Trouble paying attention.  Trouble communicating or understanding language (aphasia).  A drop in mental ability that may interfere with daily life (dementia).  Trouble with problem-solving and information processing.  Problems with reading, writing, or math.  Problems with your ability to plan and to perform activities in sequence (executive function). These problems can feel overwhelming. However, with rehabilitation and time to heal, many people have improvement in their symptoms. What causes cognitive disability? A stroke happens when blood cannot flow to certain areas of the brain. When this happens, brain cells die in the affected areas because they cannot get oxygen and nutrients from the blood. Cognitive disability is caused by the death of cells in the areas of the brain that control thinking. What is cognitive rehabilitation? Cognitive rehabilitation is a program to help you improve your thinking skills after a stroke. Rehabilitation cannot completely reverse the effects of a stroke, but it can help you with memory, problem-solving, and communication skills. Therapy focuses on:  Improving brain function. This may involve activities such as learning to break down tasks into simple steps.  Helping you learn ways to cope with thinking problems. For example, you might learn memory tricks or do activities that stimulate memory, such as naming objects or describing pictures. Cognitive rehabilitation may include:  Speech-language therapy to help you understand and use language to communicate.  Occupational therapy to help you perform daily activities.  Music therapy to help relieve stress,  anxiety, and depression. This may involve listening to music, singing, or playing instruments.  Physical therapy to help improve your ability to move and perform actions that involve the muscles (motor functions). When will therapy start and where will I have therapy? Your health care provider will decide when it is best for you to start therapy. In some cases, people start rehabilitation as soon as their health is stable, which may be 24-48 hours after the stroke. Rehabilitation can take place in a few different places, based on your needs. It may take place in:  The hospital or an in-patient rehabilitation hospital.  An outpatient rehabilitation facility.  A long-term care facility.  A community rehabilitation clinic.  Your home. What are some tools to help after a stroke? There are a number of tools and apps that you can use on your smartphone, personal computer, or tablet to help improve brain function. Some of these apps include:  Calendar reminders or alarm apps to help with memory.  Note-taking or sketch pad apps to help with memory or communication.  Text-to-speech apps that allow you to listen to what you are reading, which helps your ability to understanding text.  Picture dictionary or picture message apps to help with communication.  E-readers. These can highlight text as it is read aloud, which helps with listening and reading skills. How can my friends or family help during my rehabilitation? During your recovery, it is important that your friends and family members help you work toward more independence. Your caregivers should speak with your health care providers to learn how they can best help you during recovery. This may include working on speech-language or memory exercises at home, or helping with daily tasks and errands. If you have cognitive disability, you may   be at risk for injury or accidents at home, such as forgetting to turn off the stove. Friends and family  members can help ensure home safety by taking steps such as getting appliances with automatic shut-off features or storing dangerous objects in a secure place. What else should I know about cognitive rehabilitation after a stroke? Having trouble with memory and problem-solving can make you feel alone. You may also have mood changes, anxiety, or depression after a stroke. It is important to:  Stay connected with others through social groups, online support groups, or your community.  Talk to your friends, family, and caregivers about any emotional problems you are having.  Go to one-on-one or group therapy as suggested by your health care provider.  Stay physically active and exercise as often as suggested by your health care provider. Summary  After a stroke, some people have problems with thinking that affect attention, memory, language, communication, and problem-solving.  Cognitive rehabilitation is a program to help you regain brain function and learn skills to cope with thinking problems.  Rehabilitation cannot completely reverse the effects of a stroke, but it can help to improve quality of life.  Cognitive rehabilitation may include speech-language therapy, occupational therapy, music therapy, and physical therapy. This information is not intended to replace advice given to you by your health care provider. Make sure you discuss any questions you have with your health care provider. Document Revised: 11/02/2018 Document Reviewed: 10/16/2016 Elsevier Patient Education  2020 Elsevier Inc.  

## 2019-10-30 ENCOUNTER — Encounter: Payer: Medicaid Other | Attending: Registered Nurse | Admitting: Registered Nurse

## 2019-10-30 ENCOUNTER — Other Ambulatory Visit: Payer: Self-pay

## 2019-10-30 VITALS — BP 124/82 | HR 62 | Temp 98.9°F | Ht 62.0 in | Wt 155.0 lb

## 2019-10-30 DIAGNOSIS — E7849 Other hyperlipidemia: Secondary | ICD-10-CM | POA: Diagnosis present

## 2019-10-30 DIAGNOSIS — I63512 Cerebral infarction due to unspecified occlusion or stenosis of left middle cerebral artery: Secondary | ICD-10-CM | POA: Insufficient documentation

## 2019-10-30 DIAGNOSIS — I951 Orthostatic hypotension: Secondary | ICD-10-CM

## 2019-10-30 NOTE — Progress Notes (Signed)
Subjective:    Patient ID: Erin Peck, female    DOB: Jun 10, 1966, 54 y.o.   MRN: [id number]  HPI: Erin Peck is a 54 y.o. female who is here for transitional care visit in follow up of her left middle cerebral artery stroke, orthostatic hypotension and hyperlipidemia.  Erin Peck was recently discharge for a left MCA infarction on 10/07/2019. She presented to [facility] on 10/09/2019 with increase right sided weakness. CT and MRI was ordered, Neurology was consulted. CT Head WO Contrast:  IMPRESSION: 1. Recent Y stenting of the left M1 and proximal M2 vessels. There is new in stent narrowing/thrombosis of the lower limb with downstream underfilling. In stent thrombosis of the upper limb persists. 2. Underestimated core infarct with pseudonormalization at the level of the subacute left basal ganglia infarcts and the interval left temporal lobe infarct. There is 20 cc of ischemic range perfusion in the left temporal lobe and deep white matter which is more extensive than the noncontrast CT abnormality, probable true penumbra. 3. No new site of embolic disease. 4. 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  MRI Brain WO Contrast:  IMPRESSION: Acute infarct left MCA territory. New area of acute infarct left superior temporal lobe compared with the prior MRI 10/03/2019.  Abnormal signal in the left MCA branches compatible with slow flow or occlusion. See CTA result from earlier today.  No acute intracranial hemorrhage.  She was admitted to inpatient rehabilitation on 10/11/2019 and discharged home on 10/17/2019. She is receiving Home Health Therapy with [facility]. She denies pain. She rates her pain 0.     Pain Inventory Average Pain 0 Pain Right Now 0 My pain is tingling  In the last 24 hours, has pain interfered with the following? General activity 0 Relation with others 0 Enjoyment of life 0 What TIME  of day is your pain at its worst? na Sleep (in general) Good  Pain is worse with: na Pain improves with: na Relief from Meds: na  Mobility walk with assistance use a cane use a walker how many minutes can you walk? 25 ability to climb steps?  no do you drive?  no use a wheelchair needs help with transfers  Function not employed: date last employed . I need assistance with the following:  dressing, bathing, toileting, meal prep, household duties and shopping  Neuro/Psych weakness numbness tingling trouble walking loss of taste or smell  Prior Studies Any changes since last visit?  no CT/MRI  Physicians involved in your care Any changes since last visit?  no Primary care Elwanda [place]   No family history on file. Social History   Socioeconomic History  . Marital status: Married    Spouse name: Not on file  . Number of children: Not on file  . Years of education: Not on file  . Highest education level: Not on file  Occupational History  . Not on file  Tobacco Use  . Smoking status: Never Smoker  . Smokeless tobacco: Never Used  Substance and Sexual Activity  . Alcohol use: Not Currently  . Drug use: Not Currently  . Sexual activity: Not on file  Other Topics Concern  . Not on file  Social History Narrative  . Not on file   Social Determinants of Health   Financial Resource Strain:   . Difficulty of Paying Living Expenses:   Food Insecurity:   . Worried About Programme researcher, broadcasting/film/video in the  Last Year:   . [place] in the Last Year:   Transportation Needs:   . Film/video editor (Medical):   Marland Kitchen Lack of Transportation (Non-Medical):   Physical Activity:   . Days of Exercise per Week:   . Minutes of Exercise per Session:   Stress:   . Feeling of Stress :   Social Connections:   . Frequency of Communication with Friends and Family:   . Frequency of Social Gatherings with Friends and Family:   . Attends Religious Services:   . Active  Member of Clubs or Organizations:   . Attends Archivist Meetings:   Marland Kitchen Marital Status:    Past Surgical History:  Procedure Laterality Date  . BUBBLE STUDY  10/06/2019   Procedure: BUBBLE STUDY;  Surgeon: Sueanne Margarita, MD;  Location: [place];  Service: Cardiovascular;;  . IR CT HEAD LTD  10/03/2019  . IR INTRA CRAN STENT  10/03/2019  . IR PATIENT EVAL TECH 0-60 MINS  10/03/2019  . IR PERCUTANEOUS ART THROMBECTOMY/INFUSION INTRACRANIAL INC DIAG ANGIO  10/03/2019  . RADIOLOGY WITH ANESTHESIA N/A 10/02/2019   Procedure: IR WITH ANESTHESIA;  Surgeon: Luanne Bras, MD;  Location: [place];  Service: Radiology;  Laterality: N/A;  . TEE WITHOUT CARDIOVERSION N/A 10/06/2019   Procedure: TRANSESOPHAGEAL ECHOCARDIOGRAM (TEE);  Surgeon: Sueanne Margarita, MD;  Location: [facility] ENDOSCOPY;  Service: Cardiovascular;  Laterality: N/A;   Past Medical History:  Diagnosis Date  . Migraines   . Stroke (cerebrum) (HCC)    BP 124/82   Pulse 62   Temp 98.9 F (37.2 C)   Ht 5\' 2"  (1.575 m)   Wt 155 lb (70.3 kg)   SpO2 95%   BMI 28.35 kg/m   Opioid Risk Score:   Fall Risk Score:  `1  Depression screen PHQ 2/9  Depression screen [facility] 2/9 10/30/2019 10/23/2019  Decreased Interest 0 2  Down, Depressed, Hopeless 0 0  PHQ - 2 Score 0 2  Altered sleeping 0 0  Tired, decreased energy 0 0  Change in appetite 0 0  Feeling bad or failure about yourself  0 0  Trouble concentrating 0 0  Moving slowly or fidgety/restless 0 0  Suicidal thoughts 0 0  PHQ-9 Score 0 2     Review of Systems  Musculoskeletal: Positive for gait problem.  Neurological: Positive for weakness and numbness.       Tingling  All other systems reviewed and are negative.      Objective:   Physical Exam Vitals and nursing note reviewed.  Constitutional:      Appearance: Normal appearance.  Cardiovascular:     Rate and Rhythm: Normal rate and regular rhythm.     Pulses: Normal pulses.     Heart sounds: Normal heart  sounds.  Pulmonary:     Effort: Pulmonary effort is normal.     Breath sounds: Normal breath sounds.  Musculoskeletal:     Cervical back: Normal range of motion and neck supple.     Comments: Normal Muscle Bulk and Muscle Testing Reveals:  Upper Extremities: Right: Decreased ROM and Muscle Strength 0/5 Left: Full ROM and Muscle Strength  5/5  Lower Extremities: Right: Decreased ROM and Muscle Strength 3/5 Left: Full ROM and Muscle Strength 5/5 Arrived in wheelchair   Skin:    General: Skin is warm and dry.  Neurological:     Mental Status: She is alert and oriented to person, place, and time.  Psychiatric:  Mood and Affect: Mood normal.        Behavior: Behavior normal.           Assessment & Plan:  1. Left middle cerebral artery stroke: Continue Outpatient Therapy: Has a F/U appointment with Neurology 2.Orthostatic hypotension: Continue current medication regimen. Cardiology Following.  3. Hyperlipidemia. Continue current medication regimen. PCP Following.   15 minutes of face to face patient care time was spent during this visit. All questions were encouraged and answered.  F/U with Dr Carlis Abbott in 4- 6 weeks

## 2019-10-30 NOTE — Patient Instructions (Signed)
Call [facility] to Schedule HFU  [phone]

## 2019-10-31 ENCOUNTER — Telehealth (INDEPENDENT_AMBULATORY_CARE_PROVIDER_SITE_OTHER): Payer: Self-pay

## 2019-10-31 NOTE — Telephone Encounter (Signed)
Patient called to make a medication refill for   aspirin EC 325 MG EC tablet   Patient uses   [id number] - [place], [place] - [address]   Please advice [phone] Deloria Lair Daughter) or [phone]

## 2019-10-31 NOTE — Telephone Encounter (Signed)
Patient is aware to purchase OTC.

## 2019-11-02 ENCOUNTER — Encounter: Payer: Self-pay | Admitting: Registered Nurse

## 2019-11-02 ENCOUNTER — Telehealth: Payer: Self-pay

## 2019-11-02 NOTE — Telephone Encounter (Signed)
Missed visit this week and will discharge visit next week per family request. Orders approved and given.

## 2019-11-03 ENCOUNTER — Telehealth: Payer: Self-pay | Admitting: Nurse Practitioner

## 2019-11-03 NOTE — Telephone Encounter (Signed)
Informed the patient's daughter there is no documentation of a call, so it may have just been her appointment reminder. Reiterated to her that if someone did call and needed something, t hey will call back. She was grateful for assistance.

## 2019-11-03 NOTE — Telephone Encounter (Signed)
[name] the patient's daughter is calling stating someone called Wanda yesterday and left a VM on her home phone requesting she call back. I was unable to find any documentation of what this call was in regards to. Please advise.

## 2019-11-06 ENCOUNTER — Encounter: Payer: Self-pay | Admitting: Student

## 2019-11-06 ENCOUNTER — Ambulatory Visit (INDEPENDENT_AMBULATORY_CARE_PROVIDER_SITE_OTHER): Payer: Medicaid Other | Admitting: Student

## 2019-11-06 ENCOUNTER — Other Ambulatory Visit: Payer: Self-pay

## 2019-11-06 VITALS — BP 132/60 | HR 71 | Ht 62.0 in | Wt 150.0 lb

## 2019-11-06 DIAGNOSIS — I63512 Cerebral infarction due to unspecified occlusion or stenosis of left middle cerebral artery: Secondary | ICD-10-CM

## 2019-11-06 NOTE — Progress Notes (Signed)
PCP:  Grayce Sessions, NP Primary Cardiologist: No primary care provider on file. Electrophysiologist: Dr. Sheppard Evens is a 54 y.o. female with past medical history of cryptogenic stroke who presents today for routine electrophysiology followup. They are seen for Dr. Johney Frame.   Seen in hospital 10/06/2019 s/p cryptogenic stroke. Imaging demonstrated patchy L MCA infarcts due to left M1 occlusion s/p tpa and revascularization with L MCA stent. TTE and TEE showed normal EF and no source of thrombus. Pt consented for ILR but no insurance at that time.   Since discharge she has been doing well. She has remaining significant deficit in her RUE. Her RLE is gradually improving in strength, but remains week. No further symptoms. She denies symptoms of palpitations, dizziness, presyncope, or syncope. The patient is tolerating medications without difficulties.  She is Medicaid "pending" with no current estimate on when it will be available.   Past Medical History:  Diagnosis Date  . Migraines   . Stroke (cerebrum) [facility])    Past Surgical History:  Procedure Laterality Date  . BUBBLE STUDY  10/06/2019   Procedure: BUBBLE STUDY;  Surgeon: Quintella Reichert, MD;  Location: [facility] ENDOSCOPY;  Service: Cardiovascular;;  . IR CT HEAD LTD  10/03/2019  . IR INTRA CRAN STENT  10/03/2019  . IR PATIENT EVAL TECH 0-60 MINS  10/03/2019  . IR PERCUTANEOUS ART THROMBECTOMY/INFUSION INTRACRANIAL INC DIAG ANGIO  10/03/2019  . RADIOLOGY WITH ANESTHESIA N/A 10/02/2019   Procedure: IR WITH ANESTHESIA;  Surgeon: Julieanne Cotton, MD;  Location: [facility] OR;  Service: Radiology;  Laterality: N/A;  . TEE WITHOUT CARDIOVERSION N/A 10/06/2019   Procedure: TRANSESOPHAGEAL ECHOCARDIOGRAM (TEE);  Surgeon: Quintella Reichert, MD;  Location: [facility] ENDOSCOPY;  Service: Cardiovascular;  Laterality: N/A;    Current Outpatient Medications  Medication Sig Dispense Refill  . acetaminophen (TYLENOL) 325 MG tablet Take 2 tablets (650 mg total)  by mouth every 4 (four) hours as needed for mild pain (or temp > 37.5 C (99.5 F)).    Marland Kitchen aspirin EC 325 MG EC tablet Take 1 tablet (325 mg total) by mouth daily. 30 tablet 0  . atorvastatin (LIPITOR) 80 MG tablet Take 1 tablet (80 mg total) by mouth daily at 6 PM. 30 tablet 1  . midodrine (PROAMATINE) 5 MG tablet Take 2 tablets (10 mg total) by mouth 3 (three) times daily with meals. 90 tablet 1  . ticagrelor (BRILINTA) 90 MG TABS tablet Take 1 tablet (90 mg total) by mouth 2 (two) times daily. 60 tablet 0   No current facility-administered medications for this visit.    No Known Allergies  Social History   Socioeconomic History  . Marital status: Married    Spouse name: Not on file  . Number of children: Not on file  . Years of education: Not on file  . Highest education level: Not on file  Occupational History  . Not on file  Tobacco Use  . Smoking status: Never Smoker  . Smokeless tobacco: Never Used  Substance and Sexual Activity  . Alcohol use: Not Currently  . Drug use: Not Currently  . Sexual activity: Not on file  Other Topics Concern  . Not on file  Social History Narrative  . Not on file   Social Determinants of Health   Financial Resource Strain:   . Difficulty of Paying Living Expenses:   Food Insecurity:   . Worried About Programme researcher, broadcasting/film/video in the Last Year:   .  Ran Out of Food in the Last Year:   Transportation Needs:   . Film/video editor (Medical):   Marland Kitchen Lack of Transportation (Non-Medical):   Physical Activity:   . Days of Exercise per Week:   . Minutes of Exercise per Session:   Stress:   . Feeling of Stress :   Social Connections:   . Frequency of Communication with Friends and Family:   . Frequency of Social Gatherings with Friends and Family:   . Attends Religious Services:   . Active Member of Clubs or Organizations:   . Attends Archivist Meetings:   Marland Kitchen Marital Status:   Intimate Partner Violence:   . Fear of Current or  Ex-Partner:   . Emotionally Abused:   Marland Kitchen Physically Abused:   . Sexually Abused:      Review of Systems: General: No chills, fever, night sweats or weight changes  Cardiovascular:  No chest pain, dyspnea on exertion, edema, orthopnea, palpitations, paroxysmal nocturnal dyspnea Dermatological: No rash, lesions or masses Respiratory: No cough, dyspnea Urologic: No hematuria, dysuria Abdominal: No nausea, vomiting, diarrhea, bright red blood per rectum, melena, or hematemesis Neurologic: No visual changes, weakness, changes in mental status All other systems reviewed and are otherwise negative except as noted above.  Physical Exam: Vitals:   11/06/19 1053  BP: 132/60  Pulse: 71  SpO2: 98%  Weight: 150 lb (68 kg)  Height: 5\' 2"  (1.575 m)    GEN- The patient is well appearing, alert and oriented x 3 today.   HEENT: normocephalic, atraumatic; sclera clear, conjunctiva pink; hearing intact; oropharynx clear; neck supple, no JVP Lymph- no cervical lymphadenopathy Lungs- Clear to ausculation bilaterally, normal work of breathing.  No wheezes, rales, rhonchi Heart- Regular rate and rhythm, no murmurs, rubs or gallops, PMI not laterally displaced GI- soft, non-tender, non-distended, bowel sounds present, no hepatosplenomegaly Extremities- no clubbing, cyanosis, or edema; DP/PT/radial pulses 2+ bilaterally MS- no significant deformity or atrophy Skin- warm and dry, no rash or lesion Psych- euthymic mood, full affect Neuro- strength and sensation are intact   EKG is not ordered.  Assessment and Plan:  1. Cryptogenic stroke She is indicated for ILR, but is awaiting insurance coverage. See discussion below. She still thinks she wants the ILR, but is still considering.   She will call back to schedule/discuss further ILR implant with Dr. Rayann Heman once insurance secured.  2. Insurance coverage Medicaid Pending  RTC once medicaid arranged. At that time, will plan for ILR with Dr.  Rayann Heman.  Shirley Friar, PA-C  11/06/19 11:20 AM

## 2019-11-06 NOTE — Patient Instructions (Addendum)
Medication Instructions:  NONE *If you need a refill on your cardiac medications before your next appointment, please call your pharmacy*   Lab Work: NONE If you have labs (blood work) drawn today and your tests are completely normal, you will receive your results only by: Marland Kitchen MyChart Message (if you have MyChart) OR . A paper copy in the mail If you have any lab test that is abnormal or we need to change your treatment, we will call you to review the results.   Testing/Procedures: NONE   Follow-Up:  Please Call us once your Medicaid becomes Active. [phone]  Thank you At [facility], you and your health needs are our priority.  As part of our continuing mission to provide you with exceptional heart care, we have created designated Provider Care Teams.  These Care Teams include your primary Cardiologist (physician) and Advanced Practice Providers (APPs -  Physician Assistants and Nurse Practitioners) who all work together to provide you with the care you need, when you need it.    Other Instructions

## 2019-11-08 ENCOUNTER — Ambulatory Visit: Payer: Self-pay | Admitting: Family Medicine

## 2019-11-09 ENCOUNTER — Telehealth: Payer: Self-pay | Admitting: Physical Medicine and Rehabilitation

## 2019-11-09 ENCOUNTER — Ambulatory Visit: Payer: Self-pay | Admitting: Nurse Practitioner

## 2019-11-09 NOTE — Telephone Encounter (Signed)
Daughter called to let us know that patient is needing an order for an AFO, home health told her to call our office.  The type of brace that she called it was Dorsiflexion assist AFO.  Please send to [facility] clinic.

## 2019-11-14 ENCOUNTER — Other Ambulatory Visit: Payer: Self-pay | Admitting: Internal Medicine

## 2019-11-14 NOTE — Telephone Encounter (Signed)
Handwritten script sent to [facility] at [phone].

## 2019-11-15 ENCOUNTER — Other Ambulatory Visit: Payer: Self-pay | Admitting: Internal Medicine

## 2019-11-17 ENCOUNTER — Ambulatory Visit (HOSPITAL_COMMUNITY)
Admission: RE | Admit: 2019-11-17 | Discharge: 2019-11-17 | Disposition: A | Discharge status: Home / Self Care | Payer: MEDICAID | Source: Ambulatory Visit | Attending: Student | Admitting: Student

## 2019-11-17 NOTE — Progress Notes (Signed)
Due to CODE STROKE patient's follow-up appointment for today will need to be rescheduled.  Spoke with patient and daughter to notify. Patient were given FMLA paperwork.   Loyce Dys, MS RD PA-C 1:35 PM

## 2019-11-20 ENCOUNTER — Ambulatory Visit: Payer: MEDICAID | Admitting: Adult Health

## 2019-11-20 ENCOUNTER — Other Ambulatory Visit: Payer: Self-pay

## 2019-11-20 ENCOUNTER — Encounter: Payer: Self-pay | Admitting: Adult Health

## 2019-11-20 VITALS — BP 158/86 | HR 106 | Ht 62.0 in

## 2019-11-20 DIAGNOSIS — I639 Cerebral infarction, unspecified: Secondary | ICD-10-CM

## 2019-11-20 DIAGNOSIS — E785 Hyperlipidemia, unspecified: Secondary | ICD-10-CM

## 2019-11-20 DIAGNOSIS — I63512 Cerebral infarction due to unspecified occlusion or stenosis of left middle cerebral artery: Secondary | ICD-10-CM

## 2019-11-20 DIAGNOSIS — G8111 Spastic hemiplegia affecting right dominant side: Secondary | ICD-10-CM

## 2019-11-20 NOTE — Progress Notes (Signed)
[facility] [address] [place]. [place] [address] [phone]       HOSPITAL FOLLOW UP NOTE  [name] Date of Birth:  04-05-1966 Medical Record Number:  [id number]   Reason for Referral:  hospital stroke follow up    SUBJECTIVE:   CHIEF COMPLAINT:  Chief Complaint  Patient presents with  . New Patient (Initial Visit)    tx room, with daughter. Having fatigue post stroke.    HPI:   Erin Peck is a 54 y.o. female with history of complex migraines who does not often see a doctor who presented on 10/02/2019 to [facility] ED with R sided hemiparesis and speech deficits. Stroke work up revealed patchy left MCA infarct due to left M1 occlusion s/p TPA and transferred to [facility] for IR with TICI 2C revascularization with left MCA stent, infarct embolic secondary to unknown source.  CTA head/neck repeat showed left M2 nonocclusive in-stent thrombosis with collateral flow adequate.  Post stent, placed on aspirin 81 mg daily and Brilinta but due to lack of insurance coverage, discharged on aspirin 325 mg daily and clopidogrel 75 mg daily.  Recommended placement of loop recorder for possible atrial fibrillation stroke etiology but recommended further evaluation outpatient due to lack of insurance coverage.  No prior history of HTN presented in hypertensive emergency with BP 217/110 requiring Cleviprex and recommended long-term BP goal 120-150.  LDL 152 and initiated atorvastatin 80 mg daily.  No history evidence of DM with A1c 5.3.  Prior history of stroke on imaging.  Evaluated by therapies who recommended CIR but patient refused due to lack of insurance coverage and discharged home with recommendation of outpatient therapies.  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 Chronic lacune L  corona radiata. ASPECTS 10.     CTA head & neck ELVO L M1/L MCA bifurcation. Proximal R ICA 50% stenosis. R VA origin 50% stenosis. L VA hypoplastic/occlusion. emphysema  CT perfusion positive penumbra. Mismatch 75cc.  Cerebral angio L M1 occlusion w/ TICI2c revascularization w/ rescue Y stenting into superior and inferior divisions; superior division stent occlusion w/ revascularization using integrelin. Later treated w/ Aggrastat. Sheath out 3/9  MRI  Patchy L MCA infarcts posterior caudate and lentiform. Old L corona radiata infarct  MRA   L MCA stent w/ decreased L M1 flow and poor visualization of branches, possible residual L MCA stenosis. 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  CTA head repeat - left M2 superior branch nonocclusive in-stent thrombusis with minimal distal flow beyond stent, however, collateral flow adequate.   LE Doppler  No DVT  2D Echo EF 60-65%. No source of embolus   TEE unremarkable, no PFO  EP cardiology consulted. Will consider placement of an implantable loop recorder as an OP once has insurance coverage  P2Y12 3/11 = 39  LDL 152 -initiated atorvastatin 80 mg daily  HgbA1c 5.3  SCDs for VTE prophylaxis  No antithrombotic prior to admission, now on aspirin 81 mg daily and Brilinta (ticagrelor) 90 mg bid. Given no insurance, will switch to ASA 325 and plavix on d/c.   Therapy recommendations: CIR- pt refused - will go home withOPtherapy, DME ordered  Disposition:home   She returned on 10/09/2019 with worsening right hemiparesis and expressive aphasia with stroke work-up showing acute left temporal lobe infarct  due to new in-stent thrombosis due to Plavix failure with recent left MCA stent placement.  Recommended aspirin 325 mg daily and Brilinta 90 mg twice daily at discharge.  Hypotension and restarted midodrine 5 mg 3 times daily with BP goal 120-150 given stent occlusion.  Continued atorvastatin 80 mg daily.  Evaluated by therapies and discharged  to CIR for ongoing therapy needs.  Stroke:  Acute L temporal lobe infarct d/t new in-stent thrombosis d/t plavix failure in pt with recent L MCA stent   Code Stroke CT head interval L temporal cortex infarct. Pre-existing subcortical infarct L MCA territory.  CTA head & neck recnet Y stenting L M1 and proximal M2. New in stent narrowing of inferior division MCA w/ downstream underfilling. In stent thrombus upper limb persists. No new embolic dz. Chronic occlusion non dominant L VA  CT perfusion underestimated core infarct w/ pseudonormalization at level of subacute L basal ganglia infarct and L temporal lobe infarct. 20cc perfusion L temp and deep white matter more extensive thatn CT, probably true penumbra.   MRI  Acute L MCA territory infarct. New L superior temporal lobe infarct. Slow flow in L MCA branches c/w occlusion. No ICH.   Follow-up EP cardiology NP Gypsy Balsam, 11/09/2019 at 12pm to discuss loop recorder placement  Lovenox 40 mg sq daily for VTE prophylaxis  P2Y12 = 302   aspirin 325 mg daily and clopidogrel 75 mg daily prior to admission (complaint), now on aspirin 325 mg daily and Brilinta (ticagrelor) 90 mg bid.  Continue on discharge  Therapy recommendations:  CIR   Today, 11/20/2019, Erin Peck is being seen for hospital follow-up accompanied by her daughter.  Residual deficits of right hemiparesis arm>leg, and occasional aphasia but overall greatly improving.  Home health therapies completed due to lack of insurance but continues to do exercises assisted by daughter. Is able to ambulate short distances with cane without falls. W/c long distance. Continues on aspirin 325 mg daily and Brilinta 90 mg twice daily without bleeding or bruising.  Continues on atorvastatin without myalgias.  Ongoing use of midodrine 10 mg three times daily.  Blood pressure monitored at home prior to managing and typically ranges 120-130/70s.  Daughter reports 5-10 point increase after midodrine use.   She is questioning ongoing need or possible dosage change due to currently Medicaid pending and paying out-of-pocket.  Initial follow-up visit with IR on 11/17/2019 rescheduled due to emergent need by provider and has not been rescheduled at this time. Evaluation by cardiology and plans on pursuing ILR once insurance secured.  No further concerns at this time.     ROS:   14 system review of systems performed and negative with exception of weakness, speech impairment, fatigue  PMH:  Past Medical History:  Diagnosis Date  . Migraines   . Stroke (cerebrum) (HCC)     PSH:  Past Surgical History:  Procedure Laterality Date  . BUBBLE STUDY  10/06/2019   Procedure: BUBBLE STUDY;  Surgeon: Quintella Reichert, MD;  Location: [facility] ENDOSCOPY;  Service: Cardiovascular;;  . IR CT HEAD LTD  10/03/2019  . IR INTRA CRAN STENT  10/03/2019  . IR PATIENT EVAL TECH 0-60 MINS  10/03/2019  . IR PERCUTANEOUS ART THROMBECTOMY/INFUSION INTRACRANIAL INC DIAG ANGIO  10/03/2019  . RADIOLOGY WITH ANESTHESIA N/A 10/02/2019   Procedure: IR WITH ANESTHESIA;  Surgeon: Julieanne Cotton, MD;  Location: [facility] OR;  Service: Radiology;  Laterality: N/A;  . TEE WITHOUT CARDIOVERSION N/A 10/06/2019   Procedure: TRANSESOPHAGEAL ECHOCARDIOGRAM (  TEE);  Surgeon: Sueanne Margarita, MD;  Location: [facility] ENDOSCOPY;  Service: Cardiovascular;  Laterality: N/A;    Social History:  Social History   Socioeconomic History  . Marital status: Married    Spouse name: Not on file  . Number of children: Not on file  . Years of education: Not on file  . Highest education level: Not on file  Occupational History  . Not on file  Tobacco Use  . Smoking status: Never Smoker  . Smokeless tobacco: Never Used  Substance and Sexual Activity  . Alcohol use: Not Currently  . Drug use: Not Currently  . Sexual activity: Not on file  Other Topics Concern  . Not on file  Social History Narrative  . Not on file   Social Determinants of Health   Financial  Resource Strain:   . Difficulty of Paying Living Expenses:   Food Insecurity:   . Worried About Charity fundraiser in the Last Year:   . Arboriculturist in the Last Year:   Transportation Needs:   . Film/video editor (Medical):   Marland Kitchen Lack of Transportation (Non-Medical):   Physical Activity:   . Days of Exercise per Week:   . Minutes of Exercise per Session:   Stress:   . Feeling of Stress :   Social Connections:   . Frequency of Communication with Friends and Family:   . Frequency of Social Gatherings with Friends and Family:   . Attends Religious Services:   . Active Member of Clubs or Organizations:   . Attends Archivist Meetings:   Marland Kitchen Marital Status:   Intimate Partner Violence:   . Fear of Current or Ex-Partner:   . Emotionally Abused:   Marland Kitchen Physically Abused:   . Sexually Abused:     Family History: No family history on file.  Medications:   Current Outpatient Medications on File Prior to Visit  Medication Sig Dispense Refill  . acetaminophen (TYLENOL) 325 MG tablet Take 2 tablets (650 mg total) by mouth every 4 (four) hours as needed for mild pain (or temp > 37.5 C (99.5 F)).    Marland Kitchen aspirin EC 325 MG EC tablet Take 1 tablet (325 mg total) by mouth daily. 30 tablet 0  . atorvastatin (LIPITOR) 80 MG tablet Take 1 tablet (80 mg total) by mouth daily at 6 PM. 30 tablet 1  . midodrine (PROAMATINE) 5 MG tablet Take 2 tablets (10 mg total) by mouth 3 (three) times daily with meals. 90 tablet 1  . ticagrelor (BRILINTA) 90 MG TABS tablet Take 1 tablet (90 mg total) by mouth 2 (two) times daily. 60 tablet 0   No current facility-administered medications on file prior to visit.    Allergies:  No Known Allergies    OBJECTIVE:  Physical Exam  Vitals:   11/20/19 1501  BP: (!) 158/86  Pulse: (!) 106  Height: 5\' 2"  (1.575 m)   Body mass index is 27.44 kg/m. No exam data present  Post stroke PHQ 2/9  Depression screen PHQ 2/9 11/20/2019  Decreased Interest  0  Down, Depressed, Hopeless 0  PHQ - 2 Score 0  Altered sleeping -  Tired, decreased energy -  Change in appetite -  Feeling bad or failure about yourself  -  Trouble concentrating -  Moving slowly or fidgety/restless -  Suicidal thoughts -  PHQ-9 Score -     General: well developed, well nourished,  pleasant middle-age Caucasian female, seated, in  no evident distress Head: head normocephalic and atraumatic.   Neck: supple with no carotid or supraclavicular bruits Cardiovascular: regular rate and rhythm, no murmurs Musculoskeletal: no deformity Skin:  no rash/petichiae Vascular:  Normal pulses all extremities   Neurologic Exam Mental Status: Awake and fully alert.   Mild expressive aphasia and occasional speech hesitancy.  Oriented to place and time. Recent and remote memory intact. Attention span, concentration and fund of knowledge appropriate. Mood and affect appropriate.  Cranial Nerves: Fundoscopic exam reveals sharp disc margins. Pupils equal, briskly reactive to light. Extraocular movements full without nystagmus. Visual fields full to confrontation. Hearing intact. Facial sensation intact.  Slight right lower facial weakness. Motor: Normal strength and tone left upper and lower extremities RUE: 2/5 with increased deltoid and hand spasticity with decreased strength RLE: 4/5 without evidence of spasticity Sensory.: intact to touch , pinprick , position and vibratory sensation.  Coordination: Rapid alternating movements normal in all extremities except unable to perform finger tapping right hand. Finger-to-nose performed accurately evaluate and heel-to-shin performed accurately bilaterally. Gait and Station: Deferred as AD not present during visit Reflexes: 2+ and brisk RUE and RLE; 1+ LUE and LLE. Toes downgoing.     NIHSS 4 1a.  Level of consciousness 0 1b. LOC questions 0 1c. LOC commands 0 2.  Best gaze 0 3.  Visual 0 4.  Facial palsy 1 5a.  Motor arm-left 0 5b.   Motor arm-right 2 6a.  Motor leg-left 0 6b.  Motor leg-right 0 7.  Limb ataxia 0 8.  Sensory 0 9.  Best language 1 10.  Dysarthria 0 11.  Extinction and inattention 0  Modified Rankin  3      ASSESSMENT: Erin Peck is a 54 y.o. year old female presented with right hemiparesis and speech deficits on 10/02/2019 which stroke work-up revealing patchy left MCA infarct due to left M1 occlusion s/p TPA and IR with TICI 2C revascularization with left MCA stent, infarct embolic secondary to unknown source.  Discharged on aspirin and Plavix as patient unable to afford Brilinta given no insurance.  Discussion regarding placement of a loop recorder recommended outpatient.  Returned on 10/09/2019 with acute worsening of right-sided weakness and expressive aphasia with acute left temporal lobe infarct due to new in-stent thrombosis due to Plavix failure and discharged with aspirin 305 mg daily and Brilinta.  Vascular risk factors include complex migraines, hypertensive emergency with hypotension during recent admission, HLD, and prior stroke on imaging.  Has been recovering greatly since discharge with residual right hemiparesis upper>lower and mild expressive aphasia     PLAN:  1. Cryptogenic left MCA stroke:  -Initiate outpatient therapies once Medicaid approved.  Encouraged ongoing home exercises at this time with ongoing improvement -Advised daughter to call Dr. Fatima Sangereveshwar's office to reschedule initial follow-up visit along with ongoing recommendation of aspirin and Brilinta post stent typically 8749-month duration -Plan for ILR placement once Medicaid approved -Continue aspirin 325 mg daily and Brilinta (ticagrelor) 90 mg bid  and atorvastatin for secondary stroke prevention.  -Maintain strict control of hypertension with blood pressure goal below 130/90, diabetes with hemoglobin A1c goal below 6.5% and cholesterol with LDL cholesterol (bad cholesterol) goal below 70 mg/dL.  I also advised the  patient to eat a healthy diet with plenty of whole grains, cereals, fruits and vegetables, exercise regularly with at least 30 minutes of continuous activity daily and maintain ideal body weight. 2. Blood pressure: Initial hospital admission hypertensive emergency and then hypotensive on second  admission with need of midodrine 10 mg three times daily.  Discussion with daughter regarding hopeful use of as needed dosing three times daily if SBP<120 two provide 5 mg tablet and recheck in 1 hour.  If remains <120 provide additional 5 mg tablet. If SBP >120, recommend holding dosage and recheck in 2 to 3 hours and continue to hold if SBP >120.  Advised to continue this for three times daily dosing and continue to follow with PCP for further management, prescribing and monitoring 3. HLD: Continuation of atorvastatin with ongoing follow-up with PCP for prescribing, monitoring and management    Follow up in 3 months or call earlier if needed   I spent 50 minutes of face-to-face and non-face-to-face time with patient and daughter.  This included previsit chart review, lab review, study review, order entry, electronic health record documentation, patient education regarding recent stroke, residual deficits, importance of managing stroke risk factors and answered all questions to patient and daughters satisfaction     Ihor Austin, [facility]  [facility] [address] [place], [place] [address]  Phone [phone] Fax [phone] Note: This document was prepared with digital dictation and possible smart phrase technology. Any transcriptional errors that result from this process are unintentional.

## 2019-11-20 NOTE — Patient Instructions (Signed)
With midodrine - use if BP <120 - if greater than 120, recheck in 2-3 hours and if remains greater than 120, no need to medication. Please ensure you do this three times daily over the next 5-7 days with hopes of being able to use only on a as needed basis   Continue exercises at home and would recommend outpatient therapy once insurance is approved  Proceed with loop recorder placement once insurance approved  Continue aspirin 325 mg daily and Brilinta (ticagrelor) 90 mg bid  and lipitor  for secondary stroke prevention  Continue to follow up with PCP regarding cholesterol and blood pressure management   Continue to monitor blood pressure at home  Maintain strict control of hypertension with blood pressure goal below 130/90, diabetes with hemoglobin A1c goal below 6.5% and cholesterol with LDL cholesterol (bad cholesterol) goal below 70 mg/dL. I also advised the patient to eat a healthy diet with plenty of whole grains, cereals, fruits and vegetables, exercise regularly and maintain ideal body weight.  Followup in the future with me in 3 months or call earlier if needed       Thank you for coming to see [place] at [facility]. I hope we have been able to provide you high quality care today.  You may receive a patient satisfaction survey over the next few weeks. We would appreciate your feedback and comments so that we may continue to improve ourselves and the health of our patients.

## 2019-11-21 ENCOUNTER — Telehealth: Payer: Self-pay | Admitting: *Deleted

## 2019-11-21 ENCOUNTER — Encounter: Payer: Self-pay | Admitting: Adult Health

## 2019-11-21 NOTE — Telephone Encounter (Signed)
I called Morrie Sheldon, daughter of pt.  Pt currently on midodrine 10mg  po bid. (using 5mg  tabs).  I relayed that the medication is given depending on her Bp's.  Bp <120sys then will be giving the midodrine and if >120sys will be holding  doses, all dependent on what her Bp is prior to her 8-12-6p times. She will call Dr. office to make appt and awaiting insurance to find another pcp.  She verbalized understanding concerning the midodrine dosing.  She will call back if questions.

## 2019-11-21 NOTE — Telephone Encounter (Signed)
Further instructions are reported in my note.  Can you please follow-up with her or she can discuss further with PCP

## 2019-11-21 NOTE — Telephone Encounter (Signed)
Patient sent my chart with FMLA papers attached for her husband.  FMLA papers (2) printed, sent to medical records to have fee processed. I replied to her and advised her of $50 fee and 10-14 days turn around for completion.

## 2019-11-24 ENCOUNTER — Encounter (INDEPENDENT_AMBULATORY_CARE_PROVIDER_SITE_OTHER): Payer: Self-pay | Admitting: Primary Care

## 2019-11-26 ENCOUNTER — Other Ambulatory Visit (INDEPENDENT_AMBULATORY_CARE_PROVIDER_SITE_OTHER): Payer: Self-pay | Admitting: Primary Care

## 2019-11-27 ENCOUNTER — Encounter (INDEPENDENT_AMBULATORY_CARE_PROVIDER_SITE_OTHER): Payer: Self-pay | Admitting: Primary Care

## 2019-11-27 ENCOUNTER — Encounter: Payer: Self-pay | Admitting: Adult Health

## 2019-11-27 ENCOUNTER — Telehealth (INDEPENDENT_AMBULATORY_CARE_PROVIDER_SITE_OTHER): Payer: Self-pay | Admitting: Primary Care

## 2019-11-27 NOTE — Telephone Encounter (Signed)
1) Medication(s) Requested (by name):   ticagrelor (BRILINTA) 90 MG TABS tablet   2) [facility]  [place]    3) Special Requests:  [facility]  . I explain to her that Mrs Randa Evens she didn't prescribe the medication  But she said that Mrs Randa Evens is her pcp .  Thank you    Approved medications will be sent to the pharmacy, we will reach out if there is an issue.  Requests made after 3pm may not be addressed until the following business day!  If a patient is unsure of the name of the medication(s) please note and ask patient to call back when they are able to provide all info, do not send to responsible party until all information is available!

## 2019-11-27 NOTE — Telephone Encounter (Signed)
I will provide refill at this time as patient is running out of medication.

## 2019-11-27 NOTE — Telephone Encounter (Signed)
Medication was prescribed by a physicain's assistant at [facility], [facility]W Inpatient Re[facility], when patient was discharged.  Patient was instructed to follow up with primary care for medication management.  Patient was also supposed to follow up with Dr. Carlis Abbott at [facility]. Follow up appointment with Dr. Carlis Abbott was cancelled by patient. Currently Dr. Carlis Abbott is on vacation and cannot be reached. Patient is out of medication and needs a refill.

## 2019-11-27 NOTE — Progress Notes (Signed)
I agree with the above plan 

## 2019-11-28 ENCOUNTER — Ambulatory Visit (HOSPITAL_COMMUNITY)
Admission: RE | Admit: 2019-11-28 | Discharge: 2019-11-28 | Disposition: A | Discharge status: Home / Self Care | Payer: MEDICAID | Source: Ambulatory Visit | Attending: Student | Admitting: Student

## 2019-11-28 ENCOUNTER — Ambulatory Visit (HOSPITAL_COMMUNITY): Payer: MEDICAID

## 2019-11-28 ENCOUNTER — Telehealth: Payer: Self-pay | Admitting: Adult Health

## 2019-11-28 ENCOUNTER — Other Ambulatory Visit: Payer: Self-pay

## 2019-11-28 ENCOUNTER — Telehealth: Payer: Self-pay

## 2019-11-28 DIAGNOSIS — I63512 Cerebral infarction due to unspecified occlusion or stenosis of left middle cerebral artery: Secondary | ICD-10-CM

## 2019-11-28 NOTE — Telephone Encounter (Signed)
Patient called stating that she needs a refill on Brilinta.

## 2019-11-28 NOTE — Telephone Encounter (Signed)
Erin Peck, I sent the refill for her yesterday!

## 2019-11-28 NOTE — Telephone Encounter (Signed)
Shelton,[name](daughter on DPR) has called in regards to the medication in need of a refill.  Pt is need of a refill on her BRILINTA 90 MG TABS tablet(brand name only).[phone]  Daughter was informed that pt sent Shanda Bumps, NP a my chart message on this request yesterday afternoon.

## 2019-11-28 NOTE — Progress Notes (Signed)
Chief Complaint: Patient was seen in consultation today for CVA s/p revascularization follow-up.  Referring Physician(s): Code Stroke- Milon Dikes  Supervising Physician: Julieanne Cotton  Patient Status: [facility] - Out-pt  History of Present Illness: Ahley Bulls Peck is a 54 y.o. female with a past medical history as below, with pertinent past medical history including migraines and CVA 09/2019. She is known to [facility] and has been followed by Dr. Corliss Skains since 09/2019. She first presented to our department as an active code stroke at the request of Dr. Wilford Corner. She underwent an image-guided cerebral arteriogram with emergent mechanical thrombectomy of left MCA M1 occlusion, along with rescue Y stenting of left MCA M1 superior and inferior division reocclusion achieving a TICI 2C revascularization 10/02/2019 by Dr. Corliss Skains. She was discharged home 10/07/2019 in stable condition, however re-presented to [facility] ED 10/09/2019 with stroke-like symptoms. She was admitted to [facility] 10/09/2019 discharged to [facility] 10/11/2019, and discharged home 10/17/2019 in stable condition.  Patient presents today for follow-up regarding her recent procedure 10/02/2019. Patient awake and alert sitting in wheelchair. Accompanied by daughter. Complains of RUE weakness, improved since discharge from CIR. States she no longer does PT due to insurance, however her daughter has been preforming PT exercises daily with patient.  Denies headache, numbness/tingling, dizziness, vision changes, hearing changes, tinnitus, or speech difficulty.  Currently taking Brilinta 90 mg twice daily and Aspirin 81 mg once daily.   Past Medical History:  Diagnosis Date  . Migraines   . Stroke (cerebrum) [facility])     Past Surgical History:  Procedure Laterality Date  . BUBBLE STUDY  10/06/2019   Procedure: BUBBLE STUDY;  Surgeon: Quintella Reichert, MD;  Location: [facility] ENDOSCOPY;  Service: Cardiovascular;;  . IR CT HEAD LTD  10/03/2019  . IR INTRA CRAN STENT   10/03/2019  . IR PATIENT EVAL TECH 0-60 MINS  10/03/2019  . IR PERCUTANEOUS ART THROMBECTOMY/INFUSION INTRACRANIAL INC DIAG ANGIO  10/03/2019  . RADIOLOGY WITH ANESTHESIA N/A 10/02/2019   Procedure: IR WITH ANESTHESIA;  Surgeon: Julieanne Cotton, MD;  Location: [facility] OR;  Service: Radiology;  Laterality: N/A;  . TEE WITHOUT CARDIOVERSION N/A 10/06/2019   Procedure: TRANSESOPHAGEAL ECHOCARDIOGRAM (TEE);  Surgeon: Quintella Reichert, MD;  Location: [facility] ENDOSCOPY;  Service: Cardiovascular;  Laterality: N/A;    Allergies: Patient has no known allergies.  Medications: Prior to Admission medications   Medication Sig Start Date End Date Taking? Authorizing Provider  acetaminophen (TYLENOL) 325 MG tablet Take 2 tablets (650 mg total) by mouth every 4 (four) hours as needed for mild pain (or temp > 37.5 C (99.5 F)). 10/17/19   Angiulli, Mcarthur Rossetti, PA-C  aspirin EC 325 MG EC tablet Take 1 tablet (325 mg total) by mouth daily. 10/08/19   Rinehuls, Kinnie Scales, PA-C  atorvastatin (LIPITOR) 80 MG tablet Take 1 tablet (80 mg total) by mouth daily at 6 PM. 10/17/19   Angiulli, Mcarthur Rossetti, PA-C  BRILINTA 90 MG TABS tablet TAKE 1 TABLET (90 MG TOTAL) BY MOUTH 2 (TWO) TIMES DAILY. 11/27/19   Raulkar, Drema Pry, MD  midodrine (PROAMATINE) 5 MG tablet Take 2 tablets (10 mg total) by mouth 3 (three) times daily with meals. 10/17/19   Angiulli, Mcarthur Rossetti, PA-C     No family history on file.  Social History   Socioeconomic History  . Marital status: Married    Spouse name: Not on file  . Number of children: Not on file  . Years of education: Not on file  .  Highest education level: Not on file  Occupational History  . Not on file  Tobacco Use  . Smoking status: Never Smoker  . Smokeless tobacco: Never Used  Substance and Sexual Activity  . Alcohol use: Not Currently  . Drug use: Not Currently  . Sexual activity: Not on file  Other Topics Concern  . Not on file  Social History Narrative  . Not on file   Social Determinants  of Health   Financial Resource Strain:   . Difficulty of Paying Living Expenses:   Food Insecurity:   . Worried About Charity fundraiser in the Last Year:   . Arboriculturist in the Last Year:   Transportation Needs:   . Film/video editor (Medical):   Marland Kitchen Lack of Transportation (Non-Medical):   Physical Activity:   . Days of Exercise per Week:   . Minutes of Exercise per Session:   Stress:   . Feeling of Stress :   Social Connections:   . Frequency of Communication with Friends and Family:   . Frequency of Social Gatherings with Friends and Family:   . Attends Religious Services:   . Active Member of Clubs or Organizations:   . Attends Archivist Meetings:   Marland Kitchen Marital Status:      Review of Systems: A 12 point ROS discussed and pertinent positives are indicated in the HPI above.  All other systems are negative.  Review of Systems  Constitutional: Negative for chills and fever.  HENT: Negative for hearing loss and tinnitus.   Eyes: Negative for visual disturbance.  Respiratory: Negative for shortness of breath and wheezing.   Cardiovascular: Negative for chest pain and palpitations.  Neurological: Positive for weakness. Negative for dizziness, speech difficulty, numbness and headaches.  Psychiatric/Behavioral: Negative for behavioral problems and confusion.    Vital Signs: There were no vitals taken for this visit.  Physical Exam Constitutional:      General: She is not in acute distress. Pulmonary:     Effort: Pulmonary effort is normal. No respiratory distress.  Skin:    General: Skin is warm and dry.  Neurological:     Mental Status: She is alert and oriented to person, place, and time.     Comments: Can raise RUE to elbow, can partially bend RUE at elbow but cannot straighten, can move right thumb but no other fingers of RUE. All other 3 extremities move spontaneously.      Imaging: No results found.  Labs:  CBC: Recent Labs     10/09/19 0930 10/09/19 0930 10/09/19 0937 10/10/19 0341 10/11/19 2208 10/12/19 0537  WBC 10.4  --   --  8.9 10.3 8.6  HGB 11.1*   < > 10.5* 11.1* 10.9* 11.0*  HCT 34.5*   < > 31.0* 33.0* 32.9* 33.8*  PLT 362  --   --  400 444* 474*   < > = values in this interval not displayed.    COAGS: Recent Labs    10/02/19 1953 10/09/19 0930  INR 0.9 0.9  APTT 27 23*    BMP: Recent Labs    10/09/19 0930 10/09/19 0930 10/09/19 0937 10/10/19 0341 10/11/19 2208 10/12/19 0537  NA 143   < > 142 142 143 141  K 4.1   < > 3.9 3.3* 3.9 3.7  CL 112*   < > 111 108 111 109  CO2 21*  --   --  21* 21* 22  GLUCOSE 99   < >  94 80 103* 87  BUN 13   < > 13 12 20 19   CALCIUM 9.2  --   --  9.0 9.2 9.0  CREATININE 0.84   < > 0.80 0.84 0.73 0.83  GFRNONAA >60  --   --  >60 >60 >60  GFRAA >60  --   --  >60 >60 >60   < > = values in this interval not displayed.    LIVER FUNCTION TESTS: Recent Labs    10/02/19 1953 10/09/19 0930 10/11/19 2208 10/12/19 0537  BILITOT 0.4 0.7 0.7 0.7  AST 17 32 30 29  ALT 19 52* 53* 55*  ALKPHOS 74 92 68 73  PROT 7.4 6.8 6.2* 6.3*  ALBUMIN 4.5 3.8 3.5 3.5     Assessment and Plan:  History of acute CVA s/p cerebral arteriogram with emergent mechanical thrombectomy of left MCA M1 occlusion, along with rescue Y stenting of left MCA M1 superior and inferior division reocclusion achieving a TICI 2C revascularization 10/02/2019 by Dr. 12/02/2019. Dr. Corliss Skains was present for consultation.  Discussed ADLs. Patient states she needs assistance with cooking but able to feed self. States she uses bathroom by herself. States that she uses a cane to ambulate.  Discussed BP. Patient had questions regarding BP medication dosing. States that she went to see her neurologist who said to "taper her BP dose", however was not given clear instructions on this. Instructed patient to follow-up with neurology.  Discussed medication refills (other than Brilinta). Instructed  patient to follow-up with PCP or rehab regarding medication refills.  Discussed left MCA stents. Explained the best course of management for her stents at this time is with routine imaging scans to monitor for changes. Plan for follow-up with MRI/MRA brain/head (without contrast) 4 months from procedure 10/02/2019. Informed patient that our schedulers will call her to set up this imaging scan. Instructed patient to continue taking Brilinta 90 mg twice daily and Aspirin 81 mg once daily. Patient requesting medication refill for Brilinta. [phone] at 0935- Brilinta 90 mg tablets, take one tablet by mouth twice daily, dispense 60 tablets with 3 refills.  Note written for patient's husband, Timiko Offutt, to excuse from work today to bring patient to appointment, given to patient before she left.  All questions answered and concerns addressed. Patient and daughter convey understanding and agree with plan.  Thank you for this interesting consult.  I greatly enjoyed meeting Nyeisha D Barrett and look forward to participating in their care.  A copy of this report was sent to the requesting provider on this date.  Electronically Signed: Ronalee Red, PA-C 11/28/2019, 8:10 AM   I spent a total of 40 Minutes in face to face in clinical consultation, greater than 50% of which was counseling/coordinating care for CVA s/p revascularization follow-up.

## 2019-11-28 NOTE — Telephone Encounter (Signed)
I spoke with Shanda Bumps, NP. Refills on brilinta need to come from IR, Dr. Fatima Sanger office.  I will respond to pt's mychart message. Of note, Reeves Forth is NOT on a DPR for [facility] (only [facility] is listed.)

## 2019-11-29 NOTE — Telephone Encounter (Signed)
Patient notified

## 2019-12-04 ENCOUNTER — Ambulatory Visit: Payer: Self-pay | Admitting: Physical Medicine and Rehabilitation

## 2019-12-08 ENCOUNTER — Ambulatory Visit: Payer: Self-pay | Admitting: Physical Medicine and Rehabilitation

## 2019-12-19 ENCOUNTER — Encounter (INDEPENDENT_AMBULATORY_CARE_PROVIDER_SITE_OTHER): Payer: Self-pay | Admitting: Primary Care

## 2019-12-19 ENCOUNTER — Telehealth (INDEPENDENT_AMBULATORY_CARE_PROVIDER_SITE_OTHER): Payer: Self-pay

## 2019-12-19 ENCOUNTER — Encounter: Payer: Self-pay | Admitting: Adult Health

## 2019-12-19 NOTE — Telephone Encounter (Addendum)
Patient is requesting refill of blood pressure medication via Mychart, but it is unclear what medication she is taking for blood pressure. Did not see blood pressure medication on file. Attempted to reach patient to ask but she was unavailable and neither phone has option to leave a message. Replied to patient via Mychart asking what medication she needs.  Maryjean Morn, CMA

## 2019-12-20 ENCOUNTER — Other Ambulatory Visit: Payer: Self-pay | Admitting: Physical Medicine and Rehabilitation

## 2019-12-20 ENCOUNTER — Telehealth (INDEPENDENT_AMBULATORY_CARE_PROVIDER_SITE_OTHER): Payer: Self-pay

## 2019-12-20 MED ORDER — MIDODRINE HCL 5 MG PO TABS: 10.0000 mg | ORAL_TABLET | Freq: Three times a day (TID) | ORAL | 3 refills | Status: DC

## 2019-12-20 MED ORDER — MIDODRINE HCL 5 MG PO TABS: 10.0000 mg | ORAL_TABLET | Freq: Three times a day (TID) | ORAL | 0 refills | Status: DC

## 2019-12-20 NOTE — Telephone Encounter (Signed)
Patient called to request   midodrine (PROAMATINE) 5 MG tablet  atorvastatin (LIPITOR) 80 MG tablet   States she spoke with her specialty clinic and was advice that they would not be able to referral her medication. Patient states they advised her to contact PCP for refills.  Patient is almost out of medication.    [id number] [place] [place] - [address] [place] [address]    Please advice [phone]

## 2019-12-21 NOTE — Telephone Encounter (Signed)
Midodrine was refilled yesterday. Patient is requesting a refill of atorvastatin. Last lipid was in march of 2021.

## 2019-12-22 ENCOUNTER — Other Ambulatory Visit (INDEPENDENT_AMBULATORY_CARE_PROVIDER_SITE_OTHER): Payer: Self-pay | Admitting: Primary Care

## 2019-12-22 MED ORDER — ATORVASTATIN CALCIUM 80 MG PO TABS: 80.0000 mg | ORAL_TABLET | Freq: Every day | ORAL | 1 refills | Status: DC

## 2020-01-16 ENCOUNTER — Other Ambulatory Visit: Payer: Self-pay

## 2020-01-16 NOTE — Patient Outreach (Signed)
[facility]) Care Management  01/16/2020  Erin Peck September 24, 1965 [id number]  Telephone outreach to patient to obtain mRS was successfully completed. MRS=1  [name] [facility] Management Assistant [phone]

## 2020-01-22 ENCOUNTER — Ambulatory Visit: Payer: Medicaid Other | Attending: Nurse Practitioner | Admitting: Nurse Practitioner

## 2020-01-22 ENCOUNTER — Encounter: Payer: Self-pay | Admitting: Nurse Practitioner

## 2020-01-22 ENCOUNTER — Other Ambulatory Visit: Payer: Self-pay | Admitting: Nurse Practitioner

## 2020-01-22 ENCOUNTER — Other Ambulatory Visit: Payer: Self-pay

## 2020-01-22 VITALS — Ht 62.0 in | Wt 152.0 lb

## 2020-01-22 DIAGNOSIS — I10 Essential (primary) hypertension: Secondary | ICD-10-CM | POA: Diagnosis not present

## 2020-01-22 DIAGNOSIS — Z7689 Persons encountering health services in other specified circumstances: Secondary | ICD-10-CM | POA: Diagnosis not present

## 2020-01-22 DIAGNOSIS — I63512 Cerebral infarction due to unspecified occlusion or stenosis of left middle cerebral artery: Secondary | ICD-10-CM

## 2020-01-22 MED ORDER — ASPIRIN 325 MG PO TBEC: 325.0000 mg | DELAYED_RELEASE_TABLET | Freq: Every day | ORAL | 2 refills | Status: AC

## 2020-01-22 MED ORDER — MIDODRINE HCL 10 MG PO TABS: 10.0000 mg | ORAL_TABLET | Freq: Three times a day (TID) | ORAL | 2 refills | Status: DC

## 2020-01-22 MED ORDER — ATORVASTATIN CALCIUM 80 MG PO TABS: 80.0000 mg | ORAL_TABLET | Freq: Every day | ORAL | 1 refills | Status: DC

## 2020-01-22 MED ORDER — TICAGRELOR 90 MG PO TABS: ORAL_TABLET | ORAL | 3 refills | Status: DC

## 2020-01-22 MED FILL — BRILINTA 90 MG TABLET: 90 | 30 days supply | Qty: 60 | Fill #0

## 2020-01-22 MED FILL — MIDODRINE HCL 10 MG TABLET: 10 | 30 days supply | Qty: 90 | Fill #0

## 2020-01-22 MED FILL — ATORVASTATIN 80 MG TABLET: 80 | 30 days supply | Qty: 30 | Fill #0

## 2020-01-22 NOTE — Progress Notes (Signed)
Virtual Visit via Telephone Note Due to national recommendations of social distancing due to COVID 19, telehealth visit is felt to be most appropriate for this patient at this time.  I discussed the limitations, risks, security and privacy concerns of performing an evaluation and management service by telephone and the availability of in person appointments. I also discussed with the patient that there may be a patient responsible charge related to this service. The patient expressed understanding and agreed to proceed.    I connected with Karishma D Mcmains on 01/25/20  at   3:10 PM EDT  EDT by telephone and verified that I am speaking with the correct person using two identifiers.   Consent I discussed the limitations, risks, security and privacy concerns of performing an evaluation and management service by telephone and the availability of in person appointments. I also discussed with the patient that there may be a patient responsible charge related to this service. The patient expressed understanding and agreed to proceed.   Location of Patient: Private Residence    Location of Provider: [facility]    Persons participating in Telemedicine visit: Bertram Denver FNP-BC YY Vernon CMA Lanae D Howson    History of Present Illness: Telemedicine visit for: Establish Care  has a past medical history of Migraines and Stroke (cerebrum) (HCC). Seeing Neurology with last office visit in April. 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 Required Left MCA stent for embolic infarct secondary to unknown source. She was started on high intensity statin, ASA and plavix 75 mg daily upon discharge. ILR was recommended to evaluate for cardiac source  however she is uninsured and currently waiting to apply for financial assistance.  Per Neurology: She is questioning ongoing need or possible dosage change of midodrine due to currently  Medicaid pending and paying out-of-pocket.  Initial follow-up visit with IR on 11/17/2019 rescheduled due to emergent need by provider and has not been rescheduled at this time. Evaluation by cardiology and plans on pursuing ILR once insurance secured.  No further concerns at this time  With concern for cost of midodrine will switch to ACE inhibitor. BP Readings from Last 3 Encounters:  11/20/19 (!) 158/86  11/06/19 132/60  10/30/19 124/82    BP Readings from Last 3 Encounters:  11/20/19 (!) 158/86  11/06/19 132/60  10/30/19 124/82   Past Medical History:  Diagnosis Date   Migraines    Stroke (cerebrum) [facility])     Past Surgical History:  Procedure Laterality Date   BUBBLE STUDY  10/06/2019   Procedure: BUBBLE STUDY;  Surgeon: Quintella Reichert, MD;  Location: [facility] ENDOSCOPY;  Service: Cardiovascular;;   IR CT HEAD LTD  10/03/2019   IR INTRA CRAN STENT  10/03/2019   IR PATIENT EVAL TECH 0-60 MINS  10/03/2019   IR PERCUTANEOUS ART THROMBECTOMY/INFUSION INTRACRANIAL INC DIAG ANGIO  10/03/2019   RADIOLOGY WITH ANESTHESIA N/A 10/02/2019   Procedure: IR WITH ANESTHESIA;  Surgeon: Julieanne Cotton, MD;  Location: [facility] OR;  Service: Radiology;  Laterality: N/A;   TEE WITHOUT CARDIOVERSION N/A 10/06/2019   Procedure: TRANSESOPHAGEAL ECHOCARDIOGRAM (TEE);  Surgeon: Quintella Reichert, MD;  Location: [facility] ENDOSCOPY;  Service: Cardiovascular;  Laterality: N/A;    Family History  Problem Relation Age of Onset   Heart disease Mother    Diabetes Mother    Heart disease Father    Diabetes Father     Social History   Socioeconomic History   Marital  status: Married    Spouse name: Not on file   Number of children: Not on file   Years of education: Not on file   Highest education level: Not on file  Occupational History   Not on file  Tobacco Use   Smoking status: Former Smoker   Smokeless tobacco: Never Used  Substance and Sexual Activity   Alcohol use: Not Currently   Drug use:  Not Currently   Sexual activity: Not Currently  Other Topics Concern   Not on file  Social History Narrative   Not on file   Social Determinants of Health   Financial Resource Strain:    Difficulty of Paying Living Expenses:   Food Insecurity:    Worried About Charity fundraiser in the Last Year:    Arboriculturist in the Last Year:   Transportation Needs:    Film/video editor (Medical):    Lack of Transportation (Non-Medical):   Physical Activity:    Days of Exercise per Week:    Minutes of Exercise per Session:   Stress:    Feeling of Stress :   Social Connections:    Frequency of Communication with Friends and Family:    Frequency of Social Gatherings with Friends and Family:    Attends Religious Services:    Active Member of Clubs or Organizations:    Attends Music therapist:    Marital Status:      Observations/Objective: Awake, alert and oriented x 3   Review of Systems  Constitutional: Negative for fever, malaise/fatigue and weight loss.  HENT: Negative.  Negative for nosebleeds.   Eyes: Negative.  Negative for blurred vision, double vision and photophobia.  Respiratory: Negative.  Negative for cough and shortness of breath.   Cardiovascular: Negative.  Negative for chest pain, palpitations and leg swelling.  Gastrointestinal: Negative.  Negative for heartburn, nausea and vomiting.  Musculoskeletal: Negative.  Negative for myalgias.  Neurological: Positive for speech change and focal weakness. Negative for dizziness, seizures and headaches.  Psychiatric/Behavioral: Negative.  Negative for suicidal ideas.    Assessment and Plan: [place] was seen today for establish care.  Diagnoses and all orders for this visit:  Encounter to establish care  Essential hypertension -     lisinopril (ZESTRIL) 10 MG tablet; Take 1 tablet (10 mg total) by mouth daily. Continue all antihypertensives as prescribed.  Remember to bring in your  blood pressure log with you for your follow up appointment.  DASH/Mediterranean Diets are healthier choices for HTN.   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 NEEDS PASS -     aspirin 325 MG EC tablet; Take 1 tablet (325 mg total) by mouth daily. NEEDS PASS Follow up with neurology as prescribed     Follow Up Instructions Return in about 8 weeks (around 03/18/2020) for BP recheck.     I discussed the assessment and treatment plan with the patient. The patient was provided an opportunity to ask questions and all were answered. The patient agreed with the plan and demonstrated an understanding of the instructions.   The patient was advised to call back or seek an in-person evaluation if the symptoms worsen or if the  condition fails to improve as anticipated.  I provided 18 minutes of non-face-to-face time during this encounter including median intraservice time, reviewing previous notes, labs, imaging, medications and explaining diagnosis and management.  Claiborne Rigg, FNP-BC

## 2020-01-25 ENCOUNTER — Encounter: Payer: Self-pay | Admitting: Nurse Practitioner

## 2020-01-25 MED ORDER — LISINOPRIL 10 MG PO TABS: 10.0000 mg | ORAL_TABLET | Freq: Every day | ORAL | 1 refills | Status: DC

## 2020-01-26 MED FILL — LISINOPRIL 10 MG TABS: 10 | 30 days supply | Qty: 30 | Fill #0

## 2020-01-31 ENCOUNTER — Other Ambulatory Visit: Payer: Self-pay

## 2020-01-31 NOTE — Telephone Encounter (Signed)
Pt called for asst with RX. Pt states a man at the window had pt sign a form to submit for assistance. Transferred pt to [facility] pharmacy to check status of fin pharm app.

## 2020-02-06 ENCOUNTER — Telehealth: Payer: Self-pay | Admitting: Adult Health

## 2020-02-06 NOTE — Telephone Encounter (Signed)
I called daughter, Deloria Lair.  I relayed that per JM/NP if Sabre is doing ok then can see her 6 months from last seen (03/2020).  I offered to make appt, but she stated that will call back.  She verbalized that will call back.

## 2020-02-06 NOTE — Telephone Encounter (Signed)
If doing okay and no concerns, 44-month follow-up would be fine.  Thank you.

## 2020-02-06 NOTE — Telephone Encounter (Signed)
Patient daughter called asking if this is an appointment that her mom needs to come to because right now she does not have insurance and was wondering if they can push this appointment out some.

## 2020-02-08 ENCOUNTER — Telehealth: Payer: Self-pay | Admitting: Nurse Practitioner

## 2020-02-08 ENCOUNTER — Encounter: Payer: Self-pay | Admitting: Nurse Practitioner

## 2020-02-08 ENCOUNTER — Telehealth: Payer: Self-pay

## 2020-02-08 NOTE — Telephone Encounter (Signed)
Attempt to reach patient to inform PCP discontinue her on Midodrine and put her on Lisinopril. Pt. Need to schedule an appt. W/ clinical pharmacist Erin Peck for a BP check in 3 weeks. No answer and LVM.

## 2020-02-08 NOTE — Telephone Encounter (Signed)
I called the Pt and inform that the document she fill out for the pharmacy is not the same application for the [facility] card and CAFA, she need to schedule and appt with financial and bring all the documents necessary to qualified her for these [prorgram as well the application, please contact the FO at [phone] to schedule an appt with financial

## 2020-02-08 NOTE — Telephone Encounter (Signed)
Please follow up  Copied from CRM [id number]. Topic: General - Other >> Jan 30, 2020 10:20 AM Wyonia Hough E wrote: Reason for CRM:Pt daughter called and stated that they filled out paper work for a blue card to help with medication but is confused about the next step/ she was told to bring the paper to the pharmacy and then she was told she needed an appt to complete the process/ please advise asap

## 2020-02-09 ENCOUNTER — Other Ambulatory Visit: Payer: Self-pay | Admitting: Nurse Practitioner

## 2020-02-09 MED ORDER — MIDODRINE HCL 10 MG PO TABS: 10.0000 mg | ORAL_TABLET | Freq: Three times a day (TID) | ORAL | 2 refills | Status: AC

## 2020-02-09 NOTE — Telephone Encounter (Signed)
CMA scheduled patient an appt. For BP check w/ Clinical pharmacist at 11 A.M.

## 2020-02-13 MED FILL — MIDODRINE HCL 10 MG TABS: 10 | 30 days supply | Qty: 90 | Fill #0

## 2020-02-21 ENCOUNTER — Ambulatory Visit: Payer: MEDICAID | Admitting: Adult Health

## 2020-02-21 ENCOUNTER — Ambulatory Visit: Payer: Self-pay

## 2020-02-28 ENCOUNTER — Other Ambulatory Visit: Payer: Self-pay

## 2020-02-28 ENCOUNTER — Encounter: Payer: Self-pay | Admitting: Pharmacist

## 2020-02-28 ENCOUNTER — Other Ambulatory Visit: Payer: Self-pay | Admitting: Nurse Practitioner

## 2020-02-28 ENCOUNTER — Ambulatory Visit: Payer: Self-pay | Attending: Nurse Practitioner

## 2020-02-28 ENCOUNTER — Ambulatory Visit (HOSPITAL_BASED_OUTPATIENT_CLINIC_OR_DEPARTMENT_OTHER): Payer: Medicaid Other | Admitting: Pharmacist

## 2020-02-28 VITALS — BP 161/95 | HR 76

## 2020-02-28 DIAGNOSIS — I10 Essential (primary) hypertension: Secondary | ICD-10-CM | POA: Diagnosis not present

## 2020-02-28 DIAGNOSIS — D649 Anemia, unspecified: Secondary | ICD-10-CM

## 2020-02-28 DIAGNOSIS — Z1211 Encounter for screening for malignant neoplasm of colon: Secondary | ICD-10-CM

## 2020-02-28 MED FILL — !BRILINTA 90 MG TABLET: 30 days supply | Qty: 60 | Fill #1

## 2020-02-28 MED FILL — ATORVASTATIN 80 MG TABLET: 80 | 30 days supply | Qty: 30 | Fill #1

## 2020-02-28 NOTE — Progress Notes (Deleted)
BP 178/82  HR 68  158/125, HR 73  Home has been 120s before midodrine and 130 after she takes medicine  Blood pressure cuff at home  Denies headache, dizziness, SOB  Took midodrine this morning  Quit smoking   Cup of caffeine  No NSAIDS

## 2020-02-28 NOTE — Progress Notes (Signed)
   S:    Patient arrives with daughter, no acute concerns. Presents to the clinic for hypertension follow-up.  Patient was referred on 02/08/20 for blood pressure check.  Patient was last seen by Primary Care Provider on 10/23/19 for stroke hospitalization follow up.   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)  Caffiene: endorses 1 cup of coffee this morning Smoking: quit after recent strokes in March 2021 Medications: denies NSAID use, takes tylenol as needed   Symptoms: denies headache, dizziness, SOB  O:  Home BP readings:  - SBP 120s before midodrine and 130s after - highest SBP reported 139 - uses Omron BP cuff at home  Last 3 Office BP readings: BP Readings from Last 3 Encounters:  02/28/20 (!) 161/95  11/20/19 (!) 158/86  11/06/19 132/60   BMET    Component Value Date/Time   NA 141 10/12/2019 0537   K 3.7 10/12/2019 0537   CL 109 10/12/2019 0537   CO2 22 10/12/2019 0537   GLUCOSE 87 10/12/2019 0537   BUN 19 10/12/2019 0537   CREATININE 0.83 10/12/2019 0537   CALCIUM 9.0 10/12/2019 0537   GFRNONAA >60 10/12/2019 0537   GFRAA >60 10/12/2019 0537    Renal function: CrCl cannot be calculated (Patient's most recent lab result is older than the maximum 21 days allowed.).  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 No other causes identified. - No medication changes made - Will share findings from visit today with PCP for follow up  and potential medication adjustment   Laverna Peace, PharmD PGY-1 Pharmacy Resident 02/28/2020 11:58 AM  Supervision provided by:  Butch Penny, PharmD, CPP Clinical Pharmacist [facility] [phone]

## 2020-02-29 LAB — CBC
Hematocrit: 41.2 % (ref 34.0–46.6)
Hemoglobin: 13.6 g/dL (ref 11.1–15.9)
MCH: 29.2 pg (ref 26.6–33.0)
MCHC: 33 g/dL (ref 31.5–35.7)
MCV: 88 fL (ref 79–97)
Platelets: 337 10*3/uL (ref 150–450)
RBC: 4.66 x10E6/uL (ref 3.77–5.28)
RDW: 13.5 % (ref 11.7–15.4)
WBC: 8.8 10*3/uL (ref 3.4–10.8)

## 2020-02-29 LAB — CMP14+EGFR
ALT: 73 IU/L — ABNORMAL HIGH (ref 0–32)
AST: 51 IU/L — ABNORMAL HIGH (ref 0–40)
Albumin/Globulin Ratio: 2.1 (ref 1.2–2.2)
Albumin: 5.1 g/dL — ABNORMAL HIGH (ref 3.8–4.9)
Alkaline Phosphatase: 129 IU/L — ABNORMAL HIGH (ref 48–121)
BUN/Creatinine Ratio: 10 (ref 9–23)
BUN: 11 mg/dL (ref 6–24)
Bilirubin Total: 0.5 mg/dL (ref 0.0–1.2)
CO2: 24 mmol/L (ref 20–29)
Calcium: 10.4 mg/dL — ABNORMAL HIGH (ref 8.7–10.2)
Chloride: 99 mmol/L (ref 96–106)
Creatinine, Ser: 1.05 mg/dL — ABNORMAL HIGH (ref 0.57–1.00)
GFR calc Af Amer: 71 mL/min/{1.73_m2} (ref >59)
GFR calc non Af Amer: 62 mL/min/{1.73_m2} (ref >59)
Globulin, Total: 2.4 g/dL (ref 1.5–4.5)
Glucose: 81 mg/dL (ref 65–99)
Potassium: 4.7 mmol/L (ref 3.5–5.2)
Sodium: 138 mmol/L (ref 134–144)
Total Protein: 7.5 g/dL (ref 6.0–8.5)

## 2020-03-03 ENCOUNTER — Encounter: Payer: Self-pay | Admitting: Nurse Practitioner

## 2020-03-14 ENCOUNTER — Telehealth (HOSPITAL_COMMUNITY): Payer: Self-pay

## 2020-03-14 NOTE — Telephone Encounter (Signed)
Called to inform pt that we have sent a request for authorization. Will call to schedule once auth is back. AW

## 2020-03-15 MED FILL — MIDODRINE HCL 10 MG TABLET: 10 | 30 days supply | Qty: 90 | Fill #1

## 2020-03-16 ENCOUNTER — Encounter: Payer: Self-pay | Admitting: Nurse Practitioner

## 2020-03-17 ENCOUNTER — Other Ambulatory Visit: Payer: Self-pay | Admitting: Nurse Practitioner

## 2020-03-17 MED ORDER — MIDODRINE HCL 10 MG PO TABS: 10.0000 mg | ORAL_TABLET | Freq: Three times a day (TID) | ORAL | 6 refills | Status: DC

## 2020-03-17 MED ORDER — MIDODRINE HCL 10 MG PO TABS: 10.0000 mg | ORAL_TABLET | Freq: Three times a day (TID) | ORAL | 1 refills | Status: DC

## 2020-03-17 NOTE — Progress Notes (Signed)
BP Readings from Last 3 Encounters:  02/28/20 (!) 161/95  11/20/19 (!) 158/86  11/06/19 132/60

## 2020-03-25 ENCOUNTER — Other Ambulatory Visit (HOSPITAL_COMMUNITY): Payer: Self-pay | Admitting: Interventional Radiology

## 2020-03-25 ENCOUNTER — Telehealth (HOSPITAL_COMMUNITY): Payer: Self-pay

## 2020-03-25 DIAGNOSIS — I63512 Cerebral infarction due to unspecified occlusion or stenosis of left middle cerebral artery: Secondary | ICD-10-CM

## 2020-03-25 NOTE — Telephone Encounter (Signed)
Called to schedule mri, no answer, left vm. AW 

## 2020-03-29 ENCOUNTER — Telehealth: Payer: Self-pay | Admitting: Student

## 2020-03-29 NOTE — Telephone Encounter (Signed)
Had patient listed to follow up for possible loop recorder once insurance was obtained.   She has not secured Medicaid.     I called patient and she remember our previous conversations and the indications and procedure for loop recorder implantation. She would like to think about this for a little while.   Pt aware she can call us at our office at [phone] or send me or my staff a message in mychart to arrange this if she decides she would like to proceed.   Casimiro Needle [address]" [place], PA-C  03/29/2020 2:09 PM

## 2020-04-04 MED FILL — ATORVASTATIN 80 MG TABLET: 80 | 30 days supply | Qty: 30 | Fill #2

## 2020-04-19 ENCOUNTER — Ambulatory Visit (HOSPITAL_COMMUNITY): Payer: Medicaid Other

## 2020-04-22 MED FILL — MIDODRINE HCL 10 MG TABLET: 10 | 30 days supply | Qty: 90 | Fill #2

## 2020-04-24 ENCOUNTER — Other Ambulatory Visit: Payer: Self-pay

## 2020-04-24 ENCOUNTER — Ambulatory Visit (HOSPITAL_COMMUNITY)
Admission: RE | Admit: 2020-04-24 | Discharge: 2020-04-24 | Disposition: A | Discharge status: Home / Self Care | Payer: Medicaid Other | Source: Ambulatory Visit | Attending: Interventional Radiology | Admitting: Interventional Radiology

## 2020-04-24 ENCOUNTER — Encounter (HOSPITAL_COMMUNITY): Payer: Self-pay

## 2020-04-24 DIAGNOSIS — I63512 Cerebral infarction due to unspecified occlusion or stenosis of left middle cerebral artery: Secondary | ICD-10-CM | POA: Diagnosis present

## 2020-04-24 IMAGING — MR MR MRA HEAD W/O CM
9 of 11 series · 32 of 48 positions shown · non-contrast
Comparison: [DATE] MRI head and prior. [DATE] MRA head.
[DATE] CTA head and neck and prior.

CLINICAL DATA: Left middle cerebral artery stroke.

EXAM:
MRI HEAD WITHOUT CONTRAST
MRA HEAD WITHOUT CONTRAST
TECHNIQUE: Multiplanar, multiecho pulse sequences of the brain and surrounding
structures were obtained without intravenous contrast. Angiographic
images of the head were obtained using MRA technique without
contrast.

[Series 4: DWI · axial · 3.0mm · 1.09mm/px · z∈[-111,+32]mm · 7 of 98 slices shown (1 of 4)]
[im 1/98]
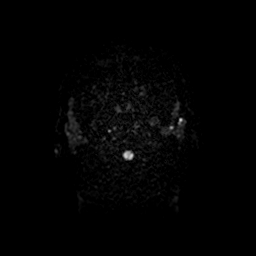
[im 17/98]
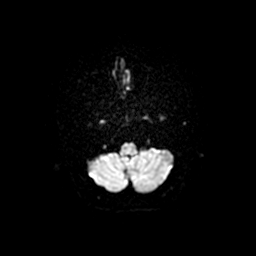
[im 33/98]
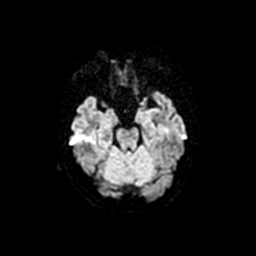
[im 49/98]
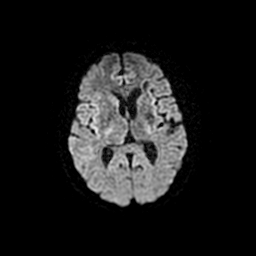
[im 65/98]
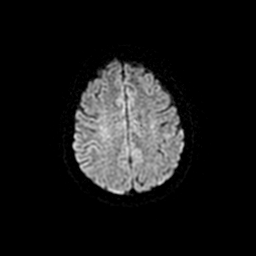
[im 81/98]
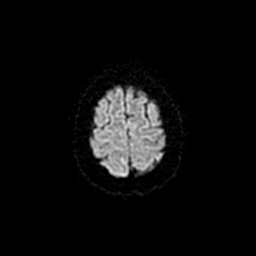
[im 98/98]
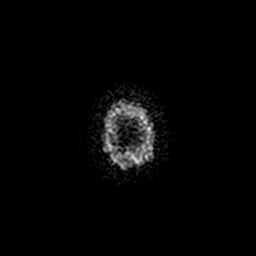

[Series 5: DWI · coronal · 5.0mm · 1.09mm/px · 6 of 74 slices shown (2 of 4)]
[im 1/74]
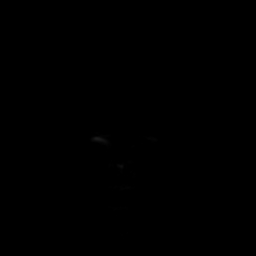
[im 15/74]
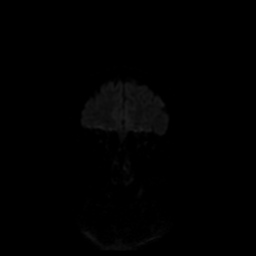
[im 30/74]
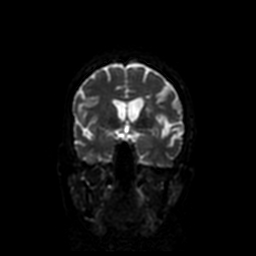
[im 44/74]
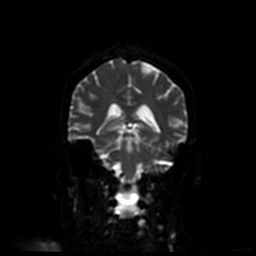
[im 59/74]
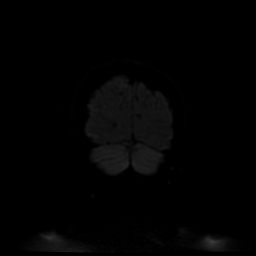
[im 74/74]
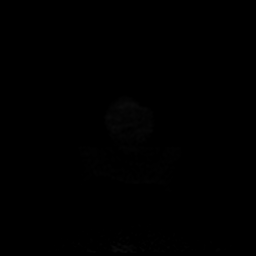

[Series 6: T1 · sagittal · 5.0mm · 0.47mm/px · 2 of 22 slices shown (1 of 2)]
[im 1/22]
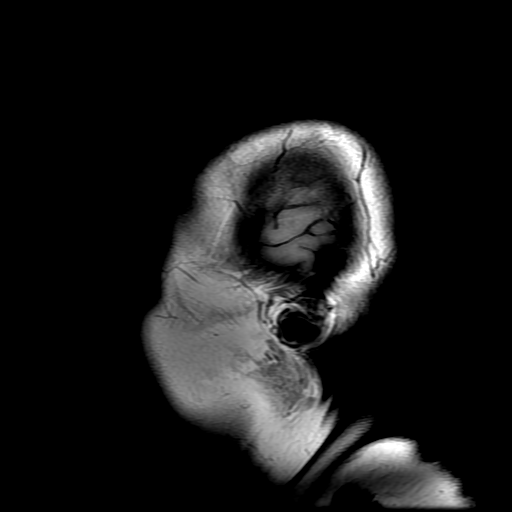
[im 22/22]
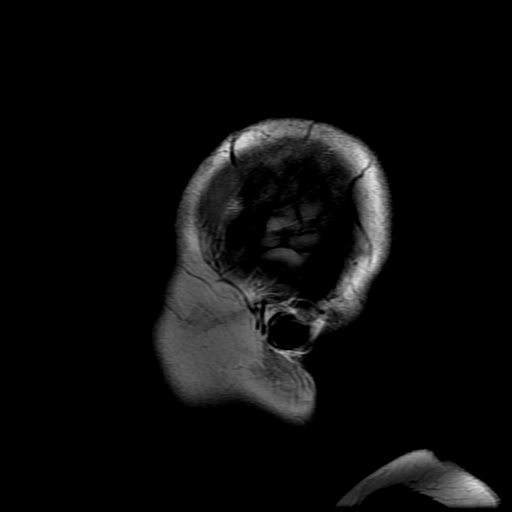

[Series 7: T2 · axial · 5.0mm · 0.43mm/px · z∈[-116,+22]mm · 2 of 24 slices shown (1 of 2)]
[im 1/24]
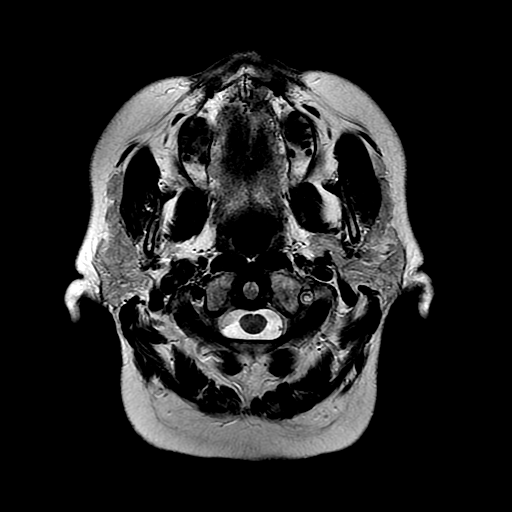
[im 24/24]
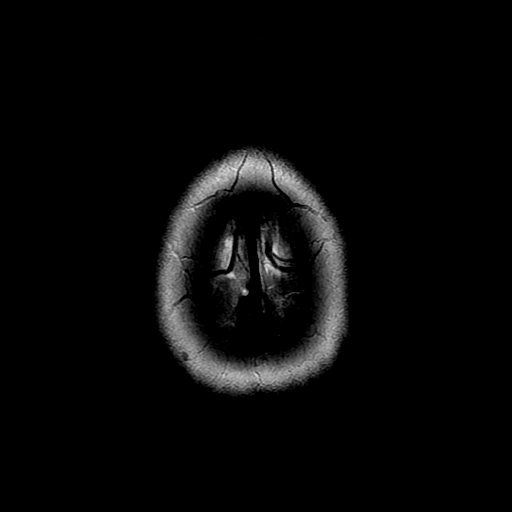

[Series 8: FLAIR · axial · 3.0mm · 0.43mm/px · z∈[-116,+22]mm · 2 of 24 slices shown]
[im 1/24]
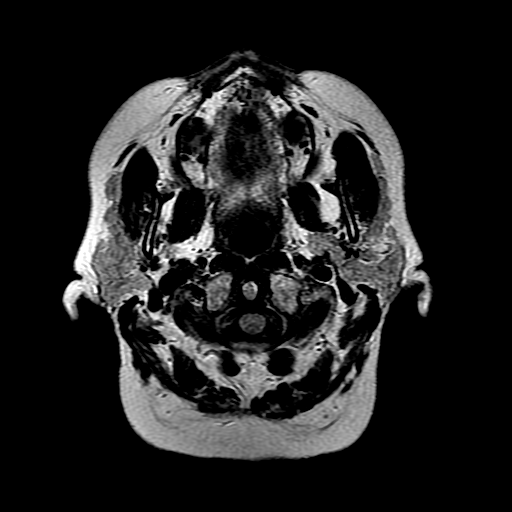
[im 24/24]
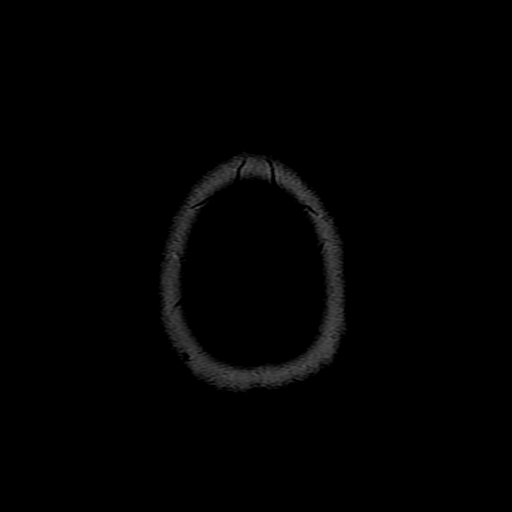

[Series 10: T1 · axial · 3.0mm · 0.43mm/px · z∈[-120,-57]mm · 4 of 100 slices shown (2 of 2)]
[im 1/100]
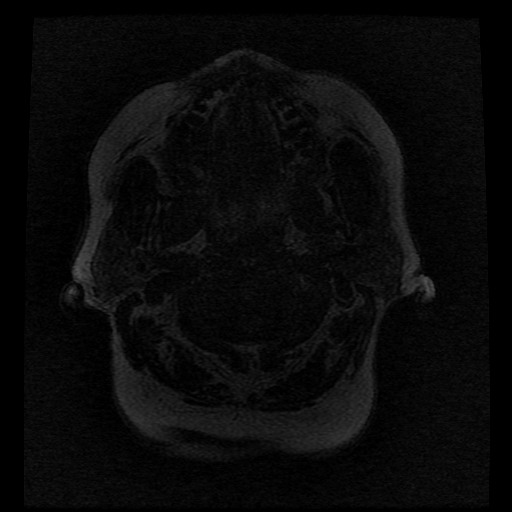
[im 15/100]
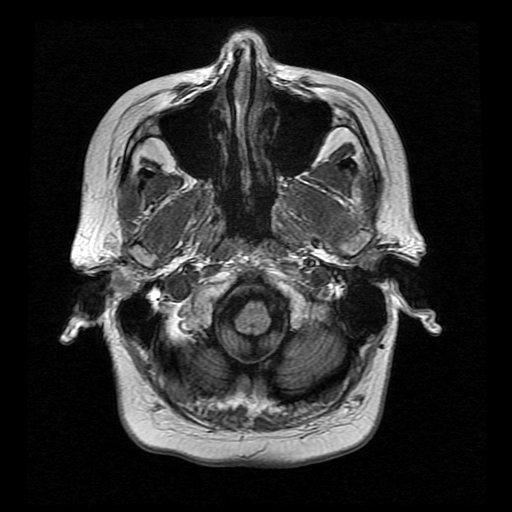
[im 29/100]
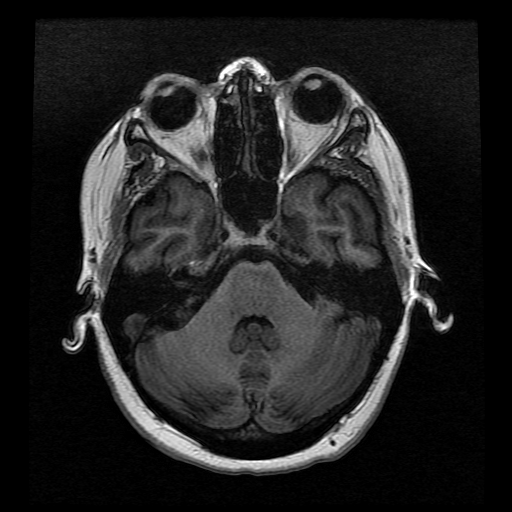
[im 43/100]
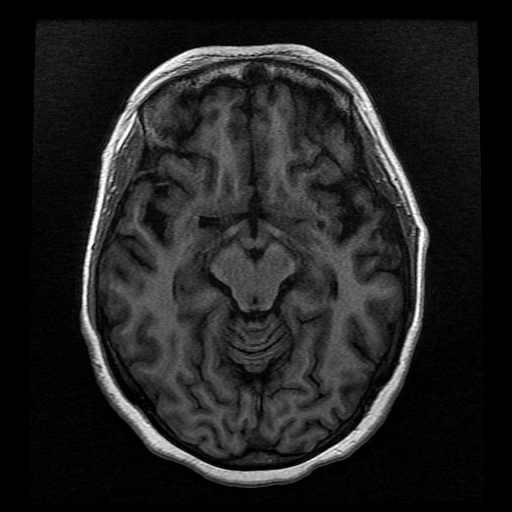

[Series 11: T2 · coronal · 5.0mm · 0.39mm/px · 2 of 25 slices shown (2 of 2)]
[im 1/25]
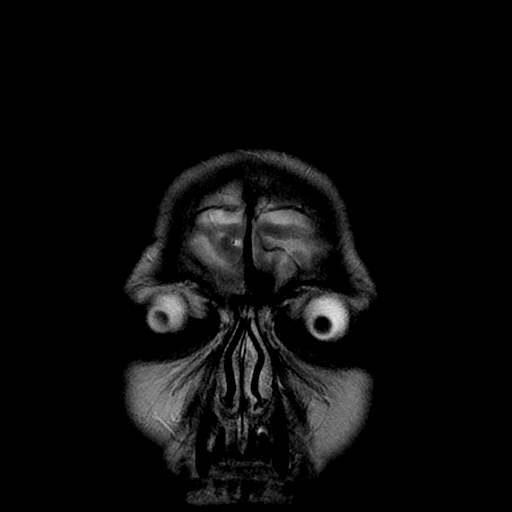
[im 25/25]
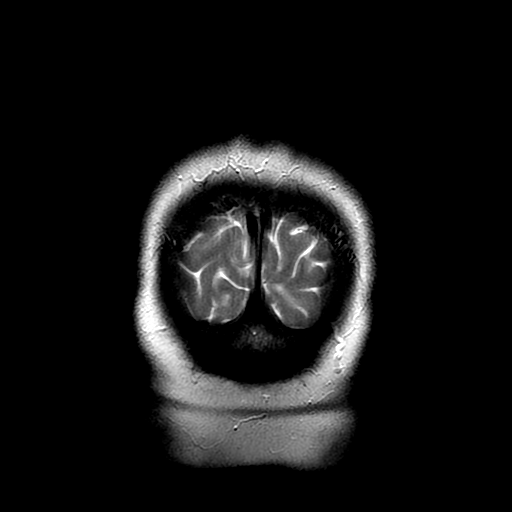

[Series 400: DWI · axial · 3.0mm · 1.09mm/px · z∈[-111,+32]mm · 4 of 49 slices shown (3 of 4)]
[im 1/49]
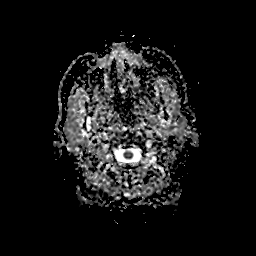
[im 17/49]
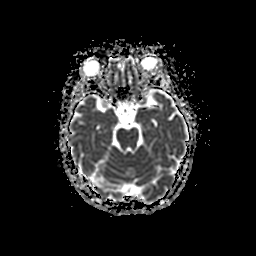
[im 33/49]
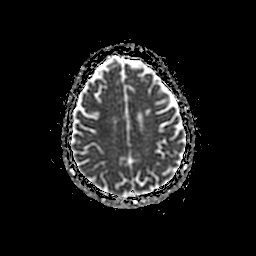
[im 49/49]
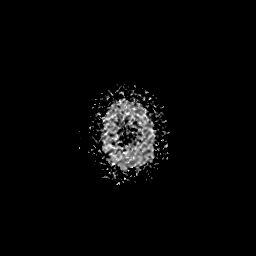

[Series 500: DWI · coronal · 5.0mm · 1.09mm/px · 3 of 37 slices shown (4 of 4)]
[im 1/37]
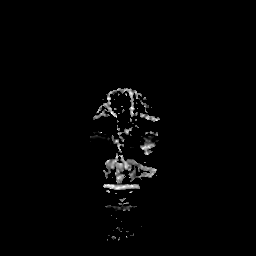
[im 19/37]
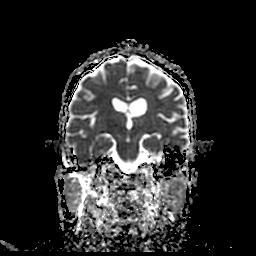
[im 37/37]
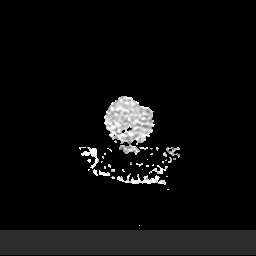

[32 of 48 positions shown; findings below may reference images not displayed]

FINDINGS: MRI HEAD FINDINGS

Brain: Mild diffusion-weighted hyperintensity involving the left
corona radiata without focal ADC correlate. Sequela of remote left
frontotemporal, left insula and left basal ganglia insults with
wallerian degeneration. Mild background chronic microvascular
ischemic changes. No intracranial hemorrhage. No midline shift,
ventriculomegaly or extra-axial fluid collection. No mass lesion.

Vascular: Please see MRA head.

Skull and upper cervical spine: Normal marrow signal.

Sinuses/Orbits: Normal orbits. Minimal ethmoid sinus mucosal
thickening. No mastoid effusion.

Other: None.

MRA HEAD FINDINGS

Anterior circulation: Sequela of left MCA Y stent. Minimal flow seen
within the proximal stent which demonstrates progression of
intraluminal irregularity along the M1 segment. Stent thrombosis
involving the superior limb left demonstrates interval progression
with diminished flow involving distal MCA branches. There is
complete occlusion of the inferior limb with no flow seen distally.

Right A1 segment hypoplasia. 2 mm outpouching arising from the
anterior communicating artery is unchanged ([DATE]). Normal appearance
of the internal carotid and right middle cerebral arteries.

Posterior circulation: Dominant right vertebral artery. New
non-opacification of the proximal left V4 segment may reflect
high-grade narrowing versus proximal occlusion. Patent bilateral
AICA. Patent basilar, bilateral superior cerebellar and posterior
cerebral arteries.

Venous sinuses: No evidence of thrombosis.

Anatomic variants: Discussed above.
IMPRESSION: MRI head:

Subacute insult involving the left corona radiata.

Sequela of remote left frontotemporal, left insular and left basal
ganglia insults with with wallerian degeneration.

Mild chronic microvascular ischemic changes.

MRA head:

Interval progression of left MCA stent thrombosis/intraluminal
narrowing with decreased downstream flow involving the superior
limb.

Complete occlusion of the inferior stent limb with no flow seen
distally.

Minimal flow within the proximal stent.

2 mm A-comm aneurysm versus infundibulum, unchanged.

High-grade narrowing versus thrombosis of the left V4 segment, new
since prior exam.

These results will be called to the ordering clinician or
representative by the Radiologist Assistant, and communication
documented in the PACS or [REDACTED].

## 2020-04-24 IMAGING — MR MR HEAD W/O CM
9 of 11 series · 32 of 48 positions shown · non-contrast
Comparison: [DATE] MRI head and prior. [DATE] MRA head.
[DATE] CTA head and neck and prior.

CLINICAL DATA: Left middle cerebral artery stroke.

EXAM:
MRI HEAD WITHOUT CONTRAST
MRA HEAD WITHOUT CONTRAST
TECHNIQUE: Multiplanar, multiecho pulse sequences of the brain and surrounding
structures were obtained without intravenous contrast. Angiographic
images of the head were obtained using MRA technique without
contrast.

[Series 4: DWI · axial · 3.0mm · 1.09mm/px · z∈[-111,+32]mm · 7 of 98 slices shown (1 of 4)]
[im 1/98]
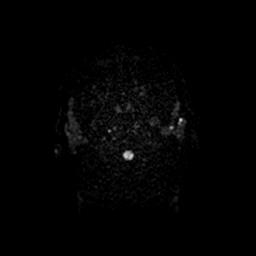
[im 17/98]
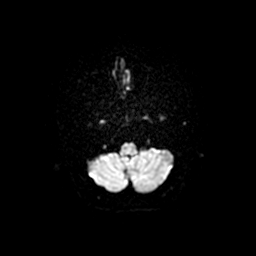
[im 33/98]
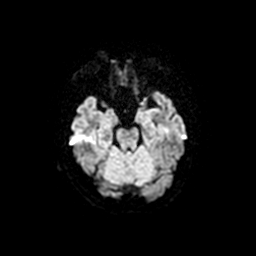
[im 49/98]
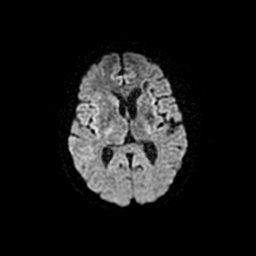
[im 65/98]
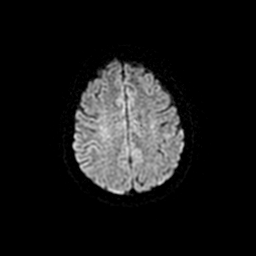
[im 81/98]
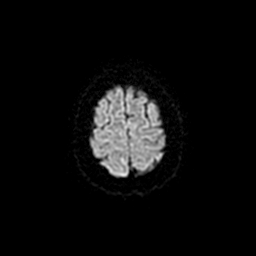
[im 98/98]
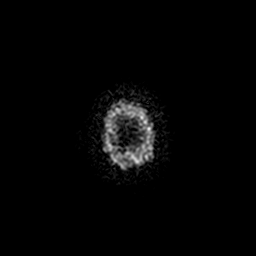

[Series 5: DWI · coronal · 5.0mm · 1.09mm/px · 6 of 74 slices shown (2 of 4)]
[im 1/74]
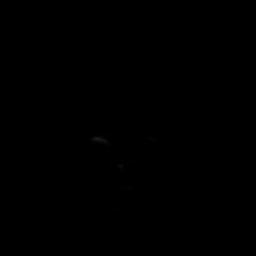
[im 15/74]
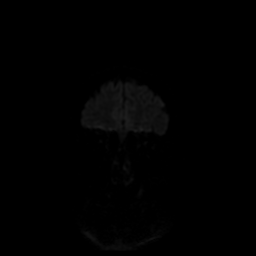
[im 30/74]
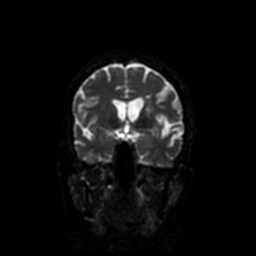
[im 44/74]
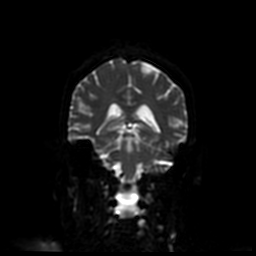
[im 59/74]
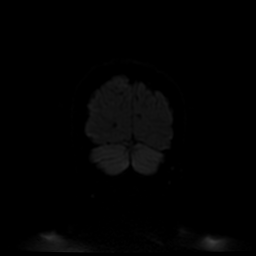
[im 74/74]
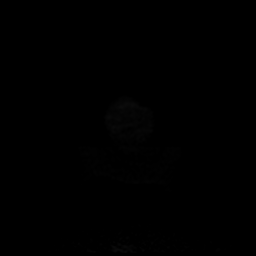

[Series 6: T1 · sagittal · 5.0mm · 0.47mm/px · 2 of 22 slices shown (1 of 2)]
[im 1/22]
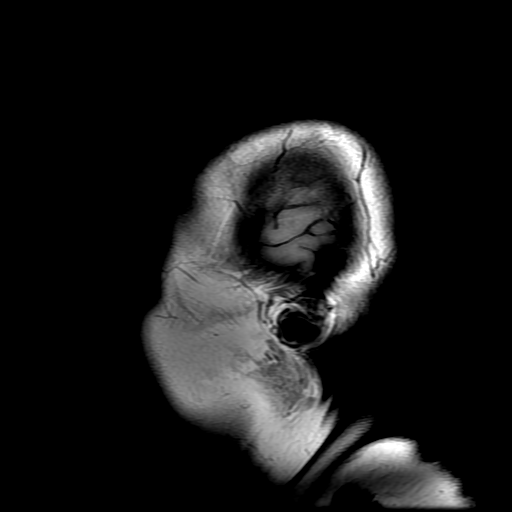
[im 22/22]
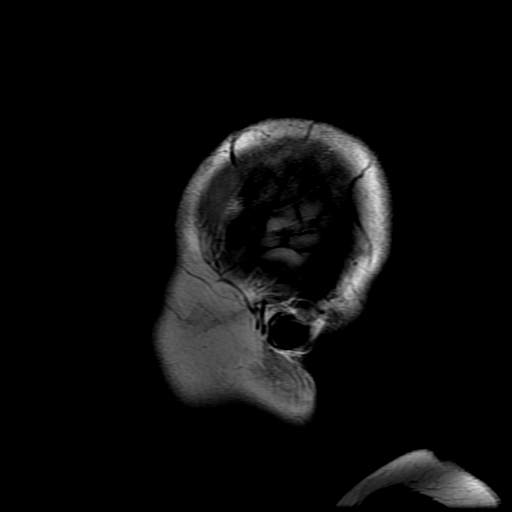

[Series 7: T2 · axial · 5.0mm · 0.43mm/px · z∈[-116,+22]mm · 2 of 24 slices shown (1 of 2)]
[im 1/24]
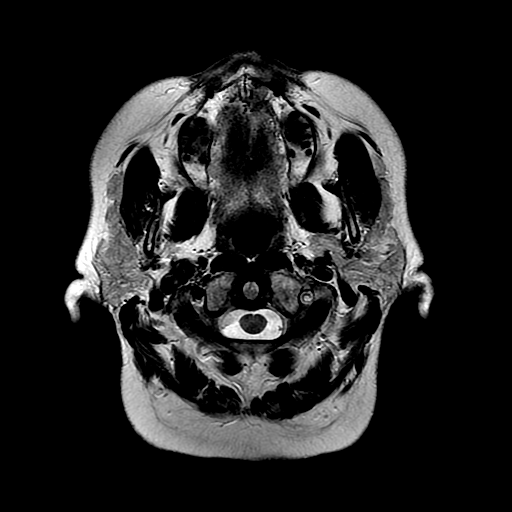
[im 24/24]
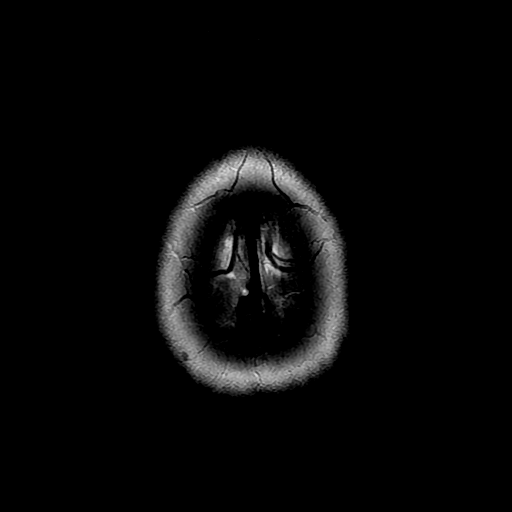

[Series 8: FLAIR · axial · 3.0mm · 0.43mm/px · z∈[-116,+22]mm · 2 of 24 slices shown]
[im 1/24]
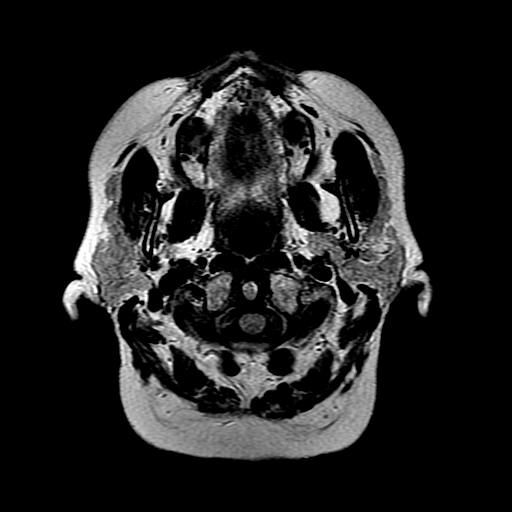
[im 24/24]
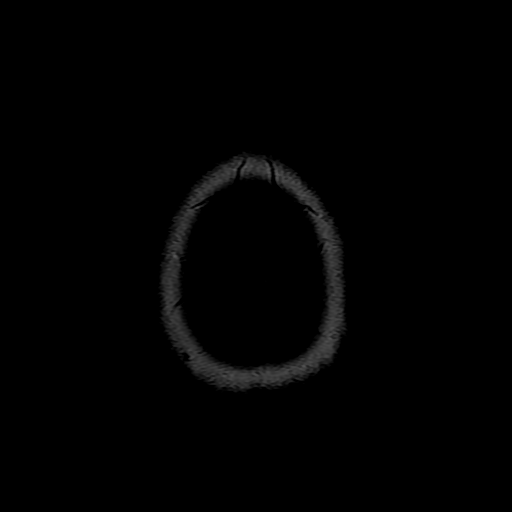

[Series 10: T1 · axial · 3.0mm · 0.43mm/px · z∈[-120,-57]mm · 4 of 100 slices shown (2 of 2)]
[im 1/100]
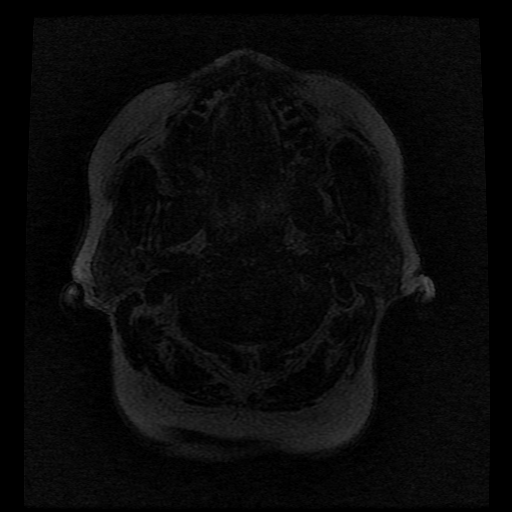
[im 15/100]
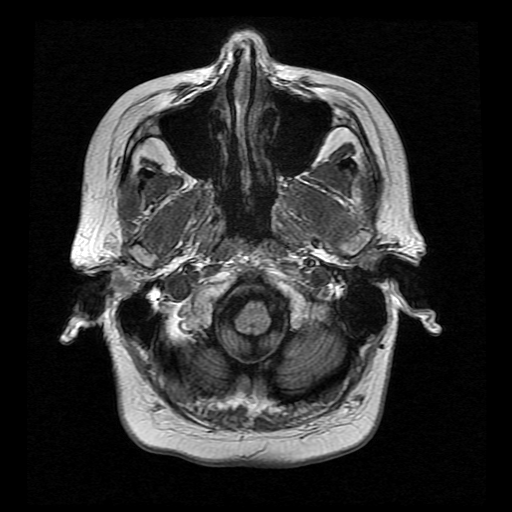
[im 29/100]
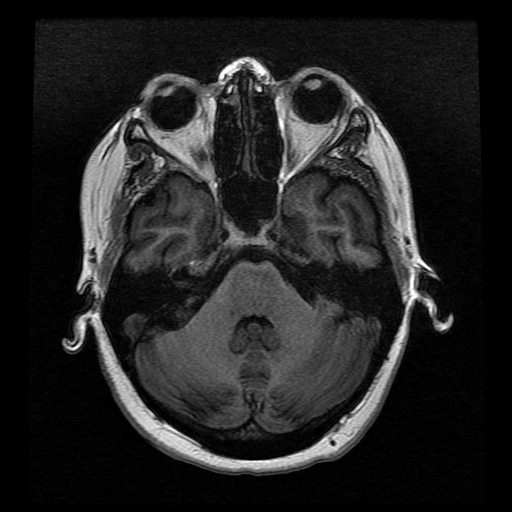
[im 43/100]
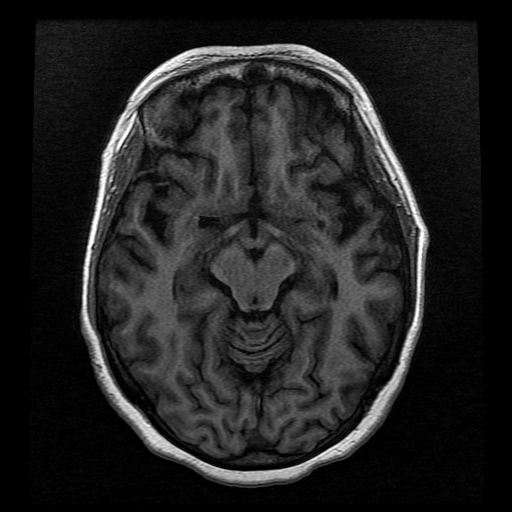

[Series 11: T2 · coronal · 5.0mm · 0.39mm/px · 2 of 25 slices shown (2 of 2)]
[im 1/25]
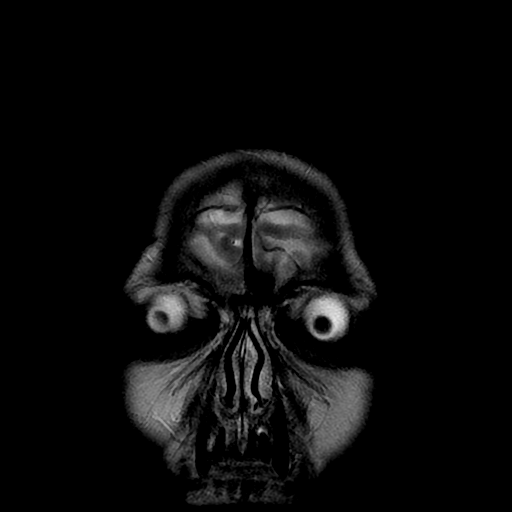
[im 25/25]
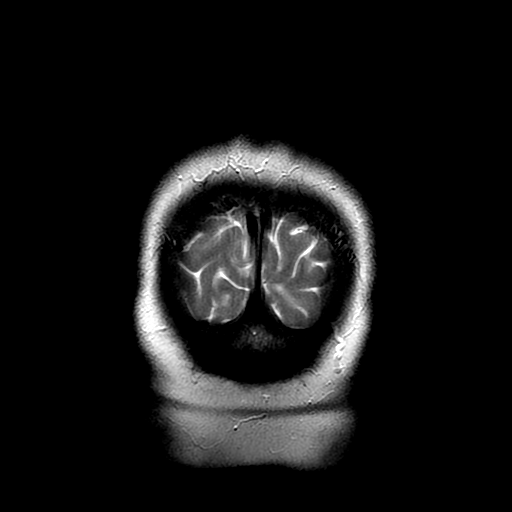

[Series 400: DWI · axial · 3.0mm · 1.09mm/px · z∈[-111,+32]mm · 4 of 49 slices shown (3 of 4)]
[im 1/49]
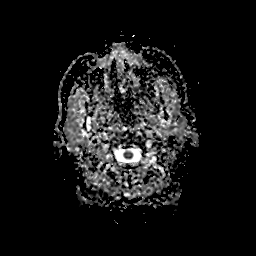
[im 17/49]
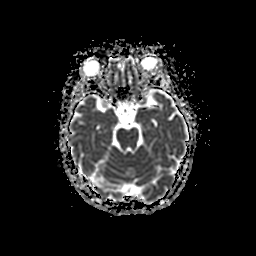
[im 33/49]
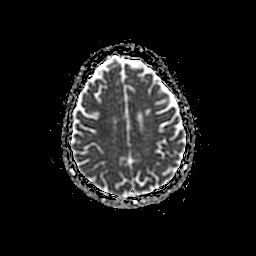
[im 49/49]
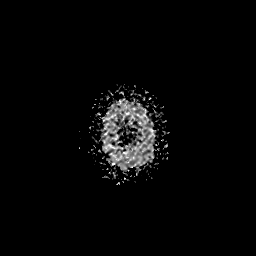

[Series 500: DWI · coronal · 5.0mm · 1.09mm/px · 3 of 37 slices shown (4 of 4)]
[im 1/37]
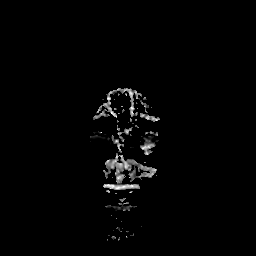
[im 19/37]
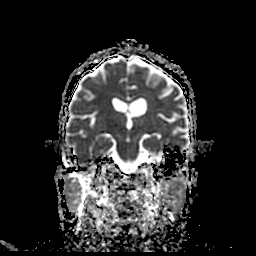
[im 37/37]
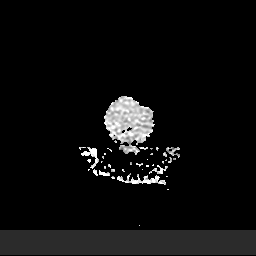

[32 of 48 positions shown; findings below may reference images not displayed]

FINDINGS: MRI HEAD FINDINGS

Brain: Mild diffusion-weighted hyperintensity involving the left
corona radiata without focal ADC correlate. Sequela of remote left
frontotemporal, left insula and left basal ganglia insults with
wallerian degeneration. Mild background chronic microvascular
ischemic changes. No intracranial hemorrhage. No midline shift,
ventriculomegaly or extra-axial fluid collection. No mass lesion.

Vascular: Please see MRA head.

Skull and upper cervical spine: Normal marrow signal.

Sinuses/Orbits: Normal orbits. Minimal ethmoid sinus mucosal
thickening. No mastoid effusion.

Other: None.

MRA HEAD FINDINGS

Anterior circulation: Sequela of left MCA Y stent. Minimal flow seen
within the proximal stent which demonstrates progression of
intraluminal irregularity along the M1 segment. Stent thrombosis
involving the superior limb left demonstrates interval progression
with diminished flow involving distal MCA branches. There is
complete occlusion of the inferior limb with no flow seen distally.

Right A1 segment hypoplasia. 2 mm outpouching arising from the
anterior communicating artery is unchanged ([DATE]). Normal appearance
of the internal carotid and right middle cerebral arteries.

Posterior circulation: Dominant right vertebral artery. New
non-opacification of the proximal left V4 segment may reflect
high-grade narrowing versus proximal occlusion. Patent bilateral
AICA. Patent basilar, bilateral superior cerebellar and posterior
cerebral arteries.

Venous sinuses: No evidence of thrombosis.

Anatomic variants: Discussed above.
IMPRESSION: MRI head:

Subacute insult involving the left corona radiata.

Sequela of remote left frontotemporal, left insular and left basal
ganglia insults with with wallerian degeneration.

Mild chronic microvascular ischemic changes.

MRA head:

Interval progression of left MCA stent thrombosis/intraluminal
narrowing with decreased downstream flow involving the superior
limb.

Complete occlusion of the inferior stent limb with no flow seen
distally.

Minimal flow within the proximal stent.

2 mm A-comm aneurysm versus infundibulum, unchanged.

High-grade narrowing versus thrombosis of the left V4 segment, new
since prior exam.

These results will be called to the ordering clinician or
representative by the Radiologist Assistant, and communication
documented in the PACS or [REDACTED].

## 2020-04-29 ENCOUNTER — Other Ambulatory Visit (HOSPITAL_COMMUNITY): Payer: Self-pay | Admitting: Interventional Radiology

## 2020-04-29 DIAGNOSIS — I639 Cerebral infarction, unspecified: Secondary | ICD-10-CM

## 2020-04-30 ENCOUNTER — Ambulatory Visit (HOSPITAL_COMMUNITY): Admission: RE | Admit: 2020-04-30 | Payer: Medicaid Other | Source: Ambulatory Visit

## 2020-04-30 ENCOUNTER — Telehealth: Payer: Self-pay | Admitting: Physician Assistant

## 2020-04-30 NOTE — Telephone Encounter (Signed)
Phone call placed to patient today by Dr. Corliss Skains to discuss recent MRI/MRA results which noted possible interval progression of the left MCA stent thrombosis/intraluminal narrowing as well as complete occlusion of the inferior stent limb with no flow seen distally.  Ms. Shanley has no complaints, she has not had any changes since she last saw [place]. She denies any numbness, tingling, new weakness, speech changes, vision changes, facial drooping, changes in gait or change in mental status. She continues to be week on her right side but this is improving - she can now ambulate without assistance device. She is independent of ADLs and is not currently undergoing PT/OT. She has been taking ASA + Brilinta without issue.  Dr. Corliss Skains discussed that he does not see any significant change from previous MRI upon his independent review. Given she is not experiencing any new or worsening symptoms patient was given the option to undergo repeat MRI/MRA in 6 months (or earlier if symptoms develop/worsen) or to be scheduled for a diagnostic cerebral angiogram to further evaluate the MRI/MRA findings. After thorough discussion of the risks vs benefits of each Ms. Tarbell has elected to undergo MRI/MRA in 6 month. She will call our office with any changes.  Plan:  1. Continue ASA 81 mg QD + Brilinta 90 mg BID 2. Repeat MRI/MRA in 6 months  3. Call our office with any new symptoms or concerns 4. Present to ED with any new acute issues  Lynnette Caffey, PA-C

## 2020-05-02 MED FILL — ATORVASTATIN CALCIUM 80 MG: 80 | 30 days supply | Qty: 30 | Fill #3

## 2020-05-13 MED FILL — BRILINTA 90 MG TABLET: 90 | 60 days supply | Qty: 120 | Fill #2

## 2020-05-17 MED FILL — MIDODRINE HCL 10 MG TABLET: 10 | 30 days supply | Qty: 90 | Fill #0

## 2020-06-04 MED FILL — ATORVASTATIN CALCIUM 80 MG: 80 | 30 days supply | Qty: 30 | Fill #4

## 2020-06-19 MED FILL — MIDODRINE HCL 10 MG TABLET: 10 | 29 days supply | Qty: 87 | Fill #1

## 2020-07-01 ENCOUNTER — Other Ambulatory Visit: Payer: Self-pay | Admitting: Family Medicine

## 2020-07-01 ENCOUNTER — Encounter: Payer: Self-pay | Admitting: Nurse Practitioner

## 2020-07-01 DIAGNOSIS — I63512 Cerebral infarction due to unspecified occlusion or stenosis of left middle cerebral artery: Secondary | ICD-10-CM

## 2020-07-01 MED ORDER — ATORVASTATIN CALCIUM 80 MG PO TABS: 80.0000 mg | ORAL_TABLET | Freq: Every day | ORAL | 1 refills | Status: DC

## 2020-07-01 MED ORDER — MIDODRINE HCL 10 MG PO TABS: 10.0000 mg | ORAL_TABLET | Freq: Three times a day (TID) | ORAL | 3 refills | Status: DC

## 2020-07-01 MED FILL — ATORVASTATIN CALCIUM 80 MG: 80 | 30 days supply | Qty: 30 | Fill #5

## 2020-07-12 MED FILL — MIDODRINE HCL 10 MG TABLET: 10 | 30 days supply | Qty: 90 | Fill #0

## 2020-08-02 MED FILL — ATORVASTATIN CALCIUM 80 MG: 80 | 90 days supply | Qty: 90 | Fill #0

## 2020-08-05 ENCOUNTER — Encounter: Payer: Self-pay | Admitting: Nurse Practitioner

## 2020-08-14 MED FILL — MIDODRINE HCL 10 MG TABLET: 10 | 30 days supply | Qty: 90 | Fill #1

## 2020-08-15 ENCOUNTER — Telehealth (INDEPENDENT_AMBULATORY_CARE_PROVIDER_SITE_OTHER): Payer: Self-pay

## 2020-08-15 NOTE — Telephone Encounter (Signed)
Please advise patient on medical concerns. Maryjean Morn, CMA   Copied from CRM [id number]. Topic: General - Other >> Aug 06, 2020 11:14 AM Lyn Hollingshead D wrote: PT has medical concerns an need to speak with a nurse of options / please advise

## 2020-08-16 NOTE — Telephone Encounter (Signed)
LVM- Left message on voicemail to return call.

## 2020-08-21 NOTE — Telephone Encounter (Signed)
Patient name and DOB verified. Patient states she is doing better now. She did not give detail but states she is doing better now.

## 2020-09-16 MED FILL — MIDODRINE HCL 10 MG TABLET: 10 | 30 days supply | Qty: 90 | Fill #2

## 2020-09-17 DIAGNOSIS — Z0271 Encounter for disability determination: Secondary | ICD-10-CM

## 2020-10-22 MED FILL — ATORVASTATIN CALCIUM 80 MG: 80 | 90 days supply | Qty: 90 | Fill #1

## 2020-10-28 ENCOUNTER — Encounter: Payer: Self-pay | Admitting: Nurse Practitioner

## 2020-10-28 ENCOUNTER — Other Ambulatory Visit: Payer: Self-pay

## 2020-10-28 ENCOUNTER — Ambulatory Visit: Payer: Medicaid Other | Admitting: Nurse Practitioner

## 2020-10-28 VITALS — BP 173/86 | HR 69 | Temp 97.6°F | Ht 62.0 in | Wt 156.0 lb

## 2020-10-28 DIAGNOSIS — I69398 Other sequelae of cerebral infarction: Secondary | ICD-10-CM

## 2020-10-28 DIAGNOSIS — E785 Hyperlipidemia, unspecified: Secondary | ICD-10-CM | POA: Diagnosis not present

## 2020-10-28 DIAGNOSIS — I959 Hypotension, unspecified: Secondary | ICD-10-CM

## 2020-10-28 DIAGNOSIS — I63512 Cerebral infarction due to unspecified occlusion or stenosis of left middle cerebral artery: Secondary | ICD-10-CM

## 2020-10-28 NOTE — Assessment & Plan Note (Signed)
Continue atorvastatin 80 mg tablet by mouth. Advised patient to recheck lipid panel today, CMP and CBC patient requests to complete blood work at next appointment. Education provided to patient with printed handouts given.  Follow-up for complete physical exam and blood work.

## 2020-10-28 NOTE — Patient Instructions (Signed)
Stroke Prevention Some medical conditions and lifestyle choices can lead to a higher risk for a stroke. You can help to prevent a stroke by eating healthy foods and exercising. It also helps to not smoke and to manage any health problems you may have. How can this condition affect me? A stroke is an emergency. It should be treated right away. A stroke can lead to brain damage or threaten your life. There is a better chance of surviving and getting better after a stroke if you get medical help right away. What can increase my risk? The following medical conditions may increase your risk of a stroke:  Diseases of the heart and blood vessels (cardiovascular disease).  High blood pressure (hypertension).  Diabetes.  High cholesterol.  Sickle cell disease.  Problems with blood clotting.  Being very overweight.  Sleeping problems (obstructivesleep apnea). Other risk factors include:  Being older than age 60.  A history of blood clots, stroke, or mini-stroke (TIA).  Race, ethnic background, or a family history of stroke.  Smoking or using tobacco products.  Taking birth control pills, especially if you smoke.  Heavy alcohol and drug use.  Not being active. What actions can I take to prevent this? Manage your health conditions  High cholesterol. ? Eat a healthy diet. If this is not enough to manage your cholesterol, you may need to take medicines. ? Take medicines as told by your doctor.  High blood pressure. ? Try to keep your blood pressure below 130/80. ? If your blood pressure cannot be managed through a healthy diet and regular exercise, you may need to take medicines. ? Take medicines as told by your doctor. ? Ask your doctor if you should check your blood pressure at home. ? Have your blood pressure checked every year.  Diabetes. ? Eat a healthy diet and get regular exercise. If your blood sugar (glucose) cannot be managed through diet and exercise, you may need to  take medicines. ? Take medicines as told by your doctor.  Talk to your doctor about getting checked for sleeping problems. Signs of a problem can include: ? Snoring a lot. ? Feeling very tired.  Make sure that you manage any other conditions you have. Nutrition  Follow instructions from your doctor about what to eat or drink. You may be told to: ? Eat and drink fewer calories each day. ? Limit how much salt (sodium) you use to 1,500 milligrams (mg) each day. ? Use only healthy fats for cooking, such as olive oil, canola oil, and sunflower oil. ? Eat healthy foods. To do this:  Choose foods that are high in fiber. These include whole grains, and fresh fruits and vegetables.  Eat at least 5 servings of fruits and vegetables a day. Try to fill one-half of your plate with fruits and vegetables at each meal.  Choose low-fat (lean) proteins. These include low-fat cuts of meat, chicken without skin, fish, tofu, beans, and nuts.  Eat low-fat dairy products. ? Avoid foods that:  Are high in salt.  Have saturated fat.  Have trans fat.  Have cholesterol.  Are processed or pre-made. ? Count how many carbohydrates you eat and drink each day.   Lifestyle  If you drink alcohol: ? Limit how much you have to:  0-1 drink a day for women who are not pregnant.  0-2 drinks a day for men. ? Know how much alcohol is in your drink. In the [place] one drink equals one 12 oz bottle   of beer ( ), one 5 oz glass of wine ( ), or one 1 oz glass of hard liquor (49mL).  Do not smoke or use any products that have nicotine or tobacco. If you need help quitting, ask your doctor.  Avoid secondhand smoke.  Do not use drugs. Activity  Try to stay at a healthy weight.  Get at least 30 minutes of exercise on most days, such as: ? Fast walking. ? Biking. ? Swimming.   Medicines  Take over-the-counter and prescription medicines only as told by your doctor.  Avoid taking birth control pills.  Talk to your doctor about the risks of taking birth control pills if: ? You are over 24 years old. ? You smoke. ? You get very bad headaches. ? You have had a blood clot. Where to find more information  American Stroke Association: www.strokeassociation.org Get help right away if:  You or a loved one has any signs of a stroke. "BE FAST" is an easy way to remember the warning signs: ? B - Balance. Dizziness, sudden trouble walking, or loss of balance. ? E - Eyes. Trouble seeing or a change in how you see. ? F - Face. Sudden weakness or loss of feeling of the face. The face or eyelid may droop on one side. ? A - Arms. Weakness or loss of feeling in an arm. This happens all of a sudden and most often on one side of the body. ? S - Speech. Sudden trouble speaking, slurred speech, or trouble understanding what people say. ? T - Time. Time to call emergency services. Write down what time symptoms started.  You or a loved one has other signs of a stroke, such as: ? A sudden, very bad headache with no known cause. ? Feeling like you may vomit (nausea). ? Vomiting. ? A seizure. These symptoms may be an emergency. Get help right away. Call your local emergency services (911 in the [place]  Do not wait to see if the symptoms will go away.  Do not drive yourself to the hospital. Summary  You can help to prevent a stroke by eating healthy, exercising, and not smoking. It also helps to manage any health problems you have.  Do not smoke or use any products that contain nicotine or tobacco.  Get help right away if you or a loved one has any signs of a stroke. This information is not intended to replace advice given to you by your health care provider. Make sure you discuss any questions you have with your health care provider. Document Revised: 02/12/2020 Document Reviewed: 02/12/2020 Elsevier Patient Education  2021 Elsevier Inc. Hypotension As your heart beats, it forces blood through your  body. This force is called blood pressure. If you have hypotension, you have low blood pressure. When your blood pressure is too low, you may not get enough blood to your brain or other parts of your body. This may cause you to feel weak, light-headed, have a fast heartbeat, or even pass out (faint). Low blood pressure may be harmless, or it may cause serious problems. What are the causes?  Blood loss.  Not enough water in the body (dehydration).  Heart problems.  Hormone problems.  Pregnancy.  A very bad infection.  Not having enough of certain nutrients.  Very bad allergic reactions.  Certain medicines. What increases the risk?  Age. The risk increases as you get older.  Conditions that affect the heart or the brain and spinal cord (central nervous system).  Taking certain medicines.  Being pregnant. What are the signs or symptoms?  Feeling: ? Weak. ? Light-headed. ? Dizzy. ? Tired (fatigued).  Blurred vision.  Fast heartbeat.  Passing out, in very bad cases. How is this treated?  Changing your diet. This may involve eating more salt (sodium) or drinking more water.  Taking medicines to raise your blood pressure.  Changing how much you take (the dosage) of some of your medicines.  Wearing compression stockings. These stockings help to prevent blood clots and reduce swelling in your legs. In some cases, you may need to go to the hospital for:  Fluid replacement. This means you will receive fluids through an IV tube.  Blood replacement. This means you will receive donated blood through an IV tube (transfusion).  Treating an infection or heart problems, if this applies.  Monitoring. You may need to be monitored while medicines that you are taking wear off. Follow these instructions at home: Eating and drinking  Drink enough fluids to keep your pee (urine) pale yellow.  Eat a healthy diet. Follow instructions from your doctor about what you can eat or  drink. A healthy diet includes: ? Fresh fruits and vegetables. ? Whole grains. ? Low-fat (lean) meats. ? Low-fat dairy products.  Eat extra salt only as told. Do not add extra salt to your diet unless your doctor tells you to.  Eat small meals often.  Avoid standing up quickly after you eat.   Medicines  Take over-the-counter and prescription medicines only as told by your doctor. ? Follow instructions from your doctor about changing how much you take of your medicines, if this applies. ? Do not stop or change any of your medicines on your own. General instructions  Wear compression stockings as told by your doctor.  Get up slowly from lying down or sitting.  Avoid hot showers and a lot of heat as told by your doctor.  Return to your normal activities as told by your doctor. Ask what activities are safe for you.  Do not use any products that contain nicotine or tobacco, such as cigarettes, e-cigarettes, and chewing tobacco. If you need help quitting, ask your doctor.  Keep all follow-up visits as told by your doctor. This is important.   Contact a doctor if:  You throw up (vomit).  You have watery poop (diarrhea).  You have a fever for more than 2-3 days.  You feel more thirsty than normal.  You feel weak and tired. Get help right away if:  You have chest pain.  You have a fast or uneven heartbeat.  You lose feeling (have numbness) in any part of your body.  You cannot move your arms or your legs.  You have trouble talking.  You get sweaty or feel light-headed.  You pass out.  You have trouble breathing.  You have trouble staying awake.  You feel mixed up (confused). Summary  Hypotension is also called low blood pressure. It is when the force of blood pumping through your arteries is too weak.  Hypotension may be harmless, or it may cause serious problems.  Treatment may include changing your diet and medicines, and wearing compression  stockings.  In very bad cases, you may need to go to the hospital. This information is not intended to replace advice given to you by your health care provider. Make sure you discuss any questions you have with your health care provider. Document Revised: 01/06/2018 Document Reviewed: 01/06/2018 Elsevier Patient Education  2021  [employer].

## 2020-10-28 NOTE — Assessment & Plan Note (Signed)
Currently on midodrine 10 mg tablet by mouth 3 times daily.  Education provided to patient with printed handouts given.  Advised patient to continue monitoring blood pressure and report changes in continuous elevation or uncontrolled hyper or hypertension.  Patient verbalized understanding.

## 2020-10-28 NOTE — Progress Notes (Signed)
New Patient Note  RE: Erin Peck MRN: [id number] DOB: [birth year] Date of Office Visit: 10/28/2020  Chief Complaint: Cerebrovascular Accident and Hypotension  History of Present Illness: Follow up  Cerebrovascular Accident This is a chronic problem. The current episode started more than 1 year ago. The problem has been gradually improving. Associated symptoms include weakness. Pertinent negatives include no change in bowel habit, chest pain, headaches, myalgias, nausea or numbness. Nothing aggravates the symptoms.  Hyperlipidemia This is a chronic problem. The current episode started more than 1 year ago. The problem is controlled. Recent lipid tests were reviewed and are variable. Exacerbated by: hx stroke. Pertinent negatives include no chest pain, leg pain or myalgias. Current antihyperlipidemic treatment includes statins. The current treatment provides significant improvement of lipids. Risk factors for coronary artery disease include dyslipidemia.    Assessment and Plan: Canyon is a 55 y.o. female with: Hypotension Currently on midodrine 10 mg tablet by mouth 3 times daily.  Education provided to patient with printed handouts given.  Advised patient to continue monitoring blood pressure and report changes in continuous elevation or uncontrolled hyper or hypertension.  Patient verbalized understanding.    Hyperlipidemia Continue atorvastatin 80 mg tablet by mouth. Advised patient to recheck lipid panel today, CMP and CBC patient requests to complete blood work at next appointment. Education provided to patient with printed handouts given.  Follow-up for complete physical exam and blood work.  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  Patient has stent placement and is gradually returning back to normal function.  On assessment right-sided weakness and decreased  sensation.  Patient is due for a follow-up with neurology.  Patient denies fever, chills, nausea or vomiting, headache, dizziness or falls in the last few months.  Education provided to patient with printed handouts given.  Advised patient to follow-up for complete physical and labs.  Patient did not want Covid vaccine, or tetanus shot. Advised patient that Pap is due, colonoscopy and mammogram.    Return in about 3 months (around 01/27/2021) for CPE/lab.   Diagnostics:   Past Medical History: Patient Active Problem List   Diagnosis Date Noted  . 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 Aortic atherosclerosis (HCC) 10/10/2019  . CVA (cerebral vascular accident) (HCC) 10/09/2019  . Hypotension 10/06/2019  . Accelerated hypertension 10/06/2019  . Hyperlipidemia 10/06/2019  . 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 Hypoglycemia 10/06/2019  . Family hx-stroke 10/06/2019  . Complicated migraine 10/06/2019  . Acute blood loss anemia 10/06/2019  . Hypokalemia 10/06/2019  . 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 Acute ischemic stroke (HCC) 10/02/2019   Past Medical History:  Diagnosis Date  . Hyperlipidemia   . Migraines   . Stroke (cerebrum) (HCC) 10/02/2019  . Stroke (cerebrum) (HCC) 10/09/2019   Past Surgical History: Past Surgical History:  Procedure Laterality Date  . BUBBLE STUDY  10/06/2019   Procedure: BUBBLE STUDY;  Surgeon: Quintella Reichert, MD;  Location: [facility] ENDOSCOPY;  Service: Cardiovascular;;  . IR CT HEAD LTD  10/03/2019  . IR INTRA CRAN STENT  10/03/2019  . IR PATIENT EVAL TECH 0-60 MINS  10/03/2019  . IR PERCUTANEOUS ART THROMBECTOMY/INFUSION INTRACRANIAL INC DIAG ANGIO  10/03/2019  . RADIOLOGY WITH ANESTHESIA N/A 10/02/2019   Procedure: IR WITH ANESTHESIA;  Surgeon: Julieanne Cotton, MD;  Location: [facility] OR;  Service: Radiology;  Laterality: N/A;  . TEE WITHOUT CARDIOVERSION N/A 10/06/2019   Procedure:  TRANSESOPHAGEAL ECHOCARDIOGRAM (TEE);  Surgeon: Quintella Reichert, MD;  Location: [facility] ENDOSCOPY;  Service: Cardiovascular;  Laterality: N/A;   Medication List:  Current Outpatient Medications  Medication Sig Dispense Refill  . acetaminophen (TYLENOL) 325 MG tablet Take 2 tablets (650 mg total) by mouth every 4 (four) hours as needed for mild pain (or temp > 37.5 C (99.5 F)).    Marland Kitchen atorvastatin (LIPITOR) 80 MG tablet TAKE 1 TABLET (80 MG TOTAL) BY MOUTH DAILY AT 6 PM. NEEDS PASS 90 tablet 1  . midodrine (PROAMATINE) 10 MG tablet TAKE 1 TABLET (10 MG TOTAL) BY MOUTH 3 (THREE) TIMES DAILY. 90 tablet 3  . ticagrelor (BRILINTA) 90 MG TABS tablet TAKE 1 TABLET (90 MG TOTAL) BY MOUTH 2 (TWO) TIMES DAILY 60 tablet 3   No current facility-administered medications for this visit.   Allergies: No Known Allergies Social History: Social History   Socioeconomic History  . Marital status: Married    Spouse name: Not on file  . Number of children: Not on file  . Years of education: Not on file  . Highest education level: Not on file  Occupational History  . Not on file  Tobacco Use  . Smoking status: Former Games developer  . Smokeless tobacco: Never Used  Substance and Sexual Activity  . Alcohol use: Not Currently  . Drug use: Not Currently  . Sexual activity: Not Currently  Other Topics Concern  . Not on file  Social History Narrative  . Not on file   Social Determinants of Health   Financial Resource Strain: Not on file  Food Insecurity: Not on file  Transportation Needs: Not on file  Physical Activity: Not on file  Stress: Not on file  Social Connections: Not on file       Family History: Family History  Problem Relation Age of Onset  . Heart disease Mother   . Diabetes Mother   . Heart disease Father   . Diabetes Father          Review of Systems  Constitutional: Negative.   HENT: Negative.   Respiratory: Negative.   Cardiovascular: Negative for chest pain.  Gastrointestinal:  Negative for change in bowel habit and nausea.  Genitourinary: Negative.   Musculoskeletal: Negative for myalgias.  Neurological: Positive for weakness. Negative for numbness and headaches.  Psychiatric/Behavioral: Negative.   All other systems reviewed and are negative.  Objective: BP (!) 173/86   Pulse 69   Temp 97.6 F (36.4 C) (Temporal)   Ht 5\' 2"  (1.575 m)   Wt 156 lb (70.8 kg)   SpO2 99%   BMI 28.53 kg/m  Body mass index is 28.53 kg/m. Physical Exam Vitals reviewed.  Constitutional:      Appearance: Normal appearance.  HENT:     Head: Normocephalic.     Nose: Nose normal.  Eyes:     Conjunctiva/sclera: Conjunctivae normal.  Cardiovascular:     Rate and Rhythm: Normal rate.  Pulmonary:     Effort: Pulmonary effort is normal.     Breath sounds: Normal breath sounds.  Abdominal:     General: Bowel sounds are normal.  Musculoskeletal:        General: Normal range of motion.  Skin:    General: Skin is warm.  Neurological:     Mental Status: She is alert and oriented to person, place, and time.     Motor: Weakness  present.     Gait: Gait abnormal.  Psychiatric:        Behavior: Behavior normal.    The plan was reviewed with the patient/family, and all questions/concerned were addressed.  It was my pleasure to see Erin Peck today and participate in her care. Please feel free to contact me with any questions or concerns.  Sincerely,  Lynnell Chad NP [facility]

## 2020-10-28 NOTE — Assessment & Plan Note (Signed)
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  Patient has stent placement and is gradually returning back to normal function.  On assessment right-sided weakness and decreased sensation.  Patient is due for a follow-up with neurology.  Patient denies fever, chills, nausea or vomiting, headache, dizziness or falls in the last few months.  Education provided to patient with printed handouts given.  Advised patient to follow-up for complete physical and labs.  Patient did not want Covid vaccine, or tetanus shot. Advised patient that Pap is due, colonoscopy and mammogram.

## 2020-11-02 ENCOUNTER — Encounter: Payer: Self-pay | Admitting: Nurse Practitioner

## 2020-11-04 ENCOUNTER — Other Ambulatory Visit: Payer: Self-pay

## 2020-11-04 MED ORDER — MIDODRINE HCL 10 MG PO TABS
ORAL_TABLET | Freq: Three times a day (TID) | ORAL | 3 refills | Status: DC
  Filled 2020-11-04: qty 90, 30d supply, fill #0

## 2020-11-05 ENCOUNTER — Other Ambulatory Visit: Payer: Self-pay

## 2020-11-05 MED ORDER — MIDODRINE HCL 10 MG PO TABS: ORAL_TABLET | Freq: Three times a day (TID) | ORAL | 3 refills | Status: DC

## 2020-11-11 ENCOUNTER — Other Ambulatory Visit: Payer: Self-pay

## 2020-11-26 ENCOUNTER — Telehealth (HOSPITAL_COMMUNITY): Payer: Self-pay

## 2020-11-26 ENCOUNTER — Other Ambulatory Visit (HOSPITAL_COMMUNITY): Payer: Self-pay | Admitting: Interventional Radiology

## 2020-11-26 DIAGNOSIS — I639 Cerebral infarction, unspecified: Secondary | ICD-10-CM

## 2020-11-26 NOTE — Telephone Encounter (Signed)
Called to schedule mri, no answer, left vm. AW 

## 2020-12-03 ENCOUNTER — Other Ambulatory Visit: Payer: Self-pay | Admitting: Family Medicine

## 2020-12-03 DIAGNOSIS — I63512 Cerebral infarction due to unspecified occlusion or stenosis of left middle cerebral artery: Secondary | ICD-10-CM

## 2020-12-05 ENCOUNTER — Encounter: Payer: Self-pay | Admitting: Nurse Practitioner

## 2020-12-05 DIAGNOSIS — I63512 Cerebral infarction due to unspecified occlusion or stenosis of left middle cerebral artery: Secondary | ICD-10-CM

## 2020-12-05 MED ORDER — ATORVASTATIN CALCIUM 80 MG PO TABS: ORAL_TABLET | ORAL | 1 refills | Status: DC

## 2020-12-27 ENCOUNTER — Other Ambulatory Visit: Payer: Self-pay

## 2020-12-27 ENCOUNTER — Ambulatory Visit (HOSPITAL_COMMUNITY): Admission: RE | Admit: 2020-12-27 | Payer: Medicaid Other | Source: Ambulatory Visit

## 2020-12-27 ENCOUNTER — Ambulatory Visit (HOSPITAL_COMMUNITY)
Admission: RE | Admit: 2020-12-27 | Discharge: 2020-12-27 | Disposition: A | Discharge status: Home / Self Care | Payer: Medicaid Other | Source: Ambulatory Visit | Attending: Interventional Radiology | Admitting: Interventional Radiology

## 2020-12-27 ENCOUNTER — Encounter (HOSPITAL_COMMUNITY): Payer: Self-pay

## 2020-12-27 DIAGNOSIS — I639 Cerebral infarction, unspecified: Secondary | ICD-10-CM | POA: Diagnosis not present

## 2020-12-27 IMAGING — MR MR HEAD W/O CM
12 of 13 series · 44 of 48 positions shown · non-contrast
Comparison: No pertinent prior exam.
COMPARISON: MRI/MRA head CT [DATE]

CLINICAL DATA: Ischemic stroke.

EXAM:
MRI HEAD WITHOUT CONTRAST
MRA HEAD WITHOUT CONTRAST
TECHNIQUE: Multiplanar, multi-echo pulse sequences of the brain and surrounding
structures were acquired without intravenous contrast. Angiographic
images of the Circle of Willis were acquired using MRA technique
without intravenous contrast.

[Series 5: DWI · axial · 3.0mm · 0.88mm/px · z∈[-107,+33]mm · 9 of 96 slices shown (1 of 4)]
[im 1/96]
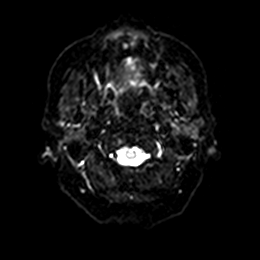
[im 12/96]
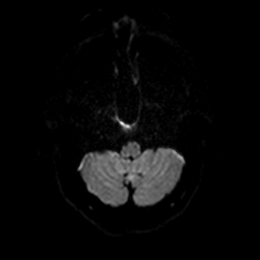
[im 24/96]
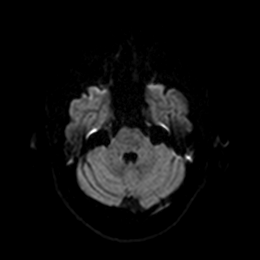
[im 36/96]
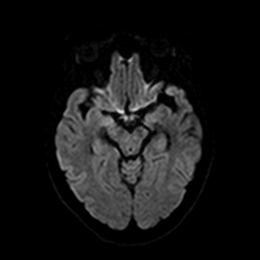
[im 48/96]
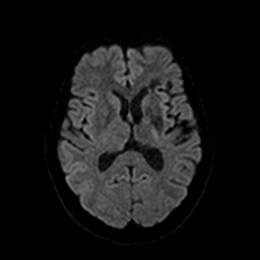
[im 60/96]
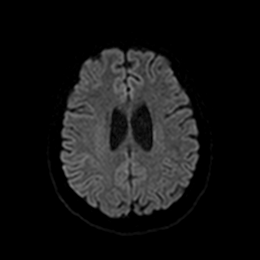
[im 72/96]
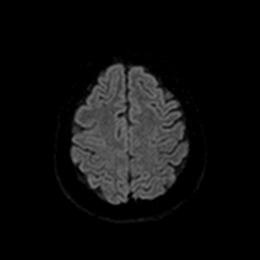
[im 84/96]
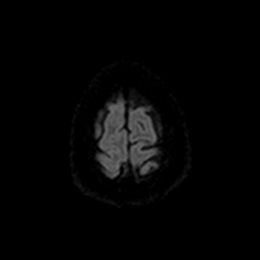
[im 96/96]
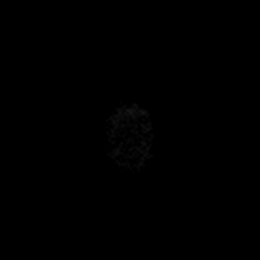

[Series 6: DWI · axial · 3.0mm · 0.88mm/px · z∈[-107,+33]mm · 4 of 48 slices shown (2 of 4)]
[im 1/48]
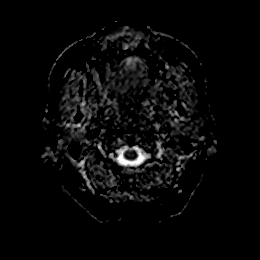
[im 16/48]
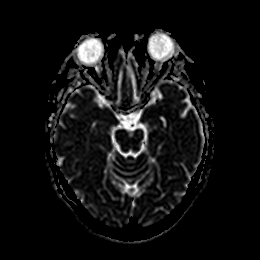
[im 32/48]
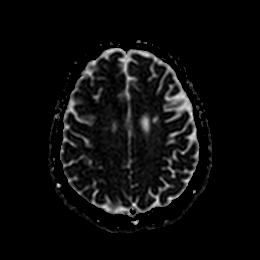
[im 48/48]
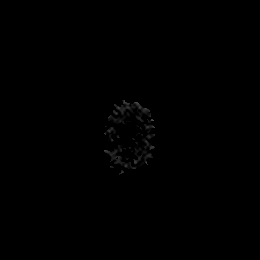

[Series 7: DWI · coronal · 4.0mm · 0.88mm/px · 5 of 64 slices shown (3 of 4)]
[im 1/64]
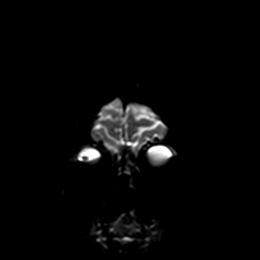
[im 16/64]
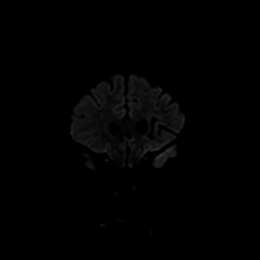
[im 32/64]
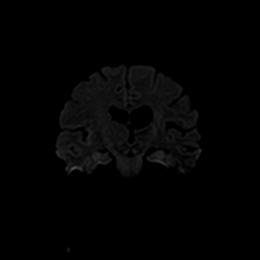
[im 48/64]
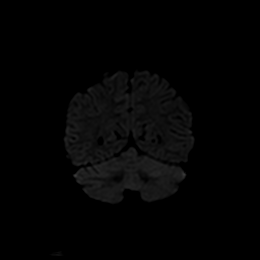
[im 64/64]
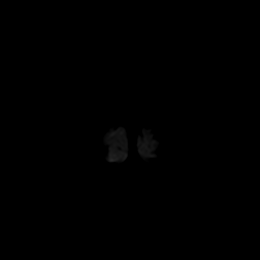

[Series 8: DWI · coronal · 4.0mm · 0.88mm/px · 3 of 32 slices shown (4 of 4)]
[im 1/32]
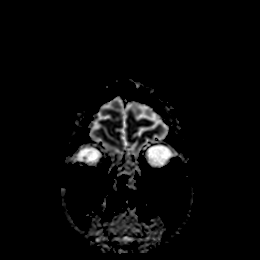
[im 16/32]
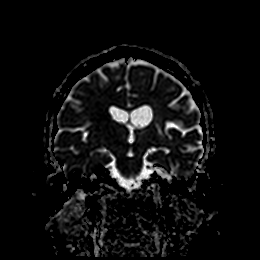
[im 32/32]
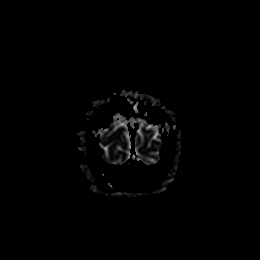

[Series 13: T1 · sagittal · 5.0mm · 0.75mm/px · 2 of 23 slices shown]
[im 1/23]
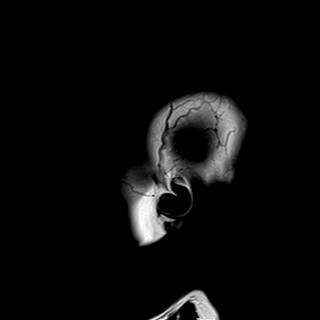
[im 23/23]
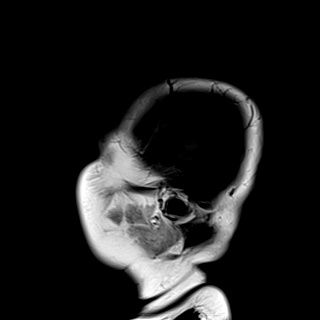

[Series 14: T2 · axial · 5.0mm · 0.72mm/px · z∈[-105,+32]mm · 2 of 24 slices shown (1 of 2)]
[im 1/24]
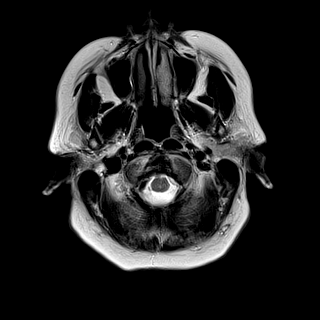
[im 24/24]
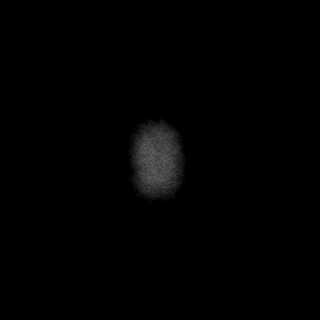

[Series 15: FLAIR · axial · 5.0mm · 0.45mm/px · z∈[-106,+31]mm · 2 of 24 slices shown]
[im 1/24]
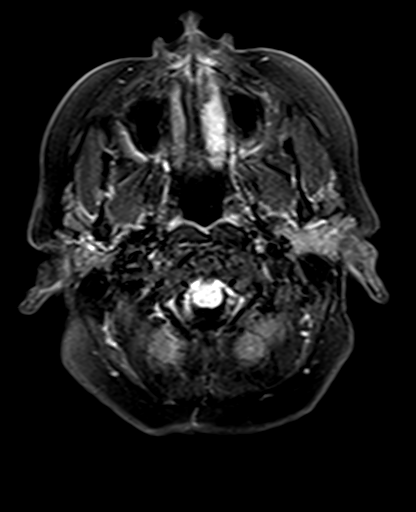
[im 24/24]
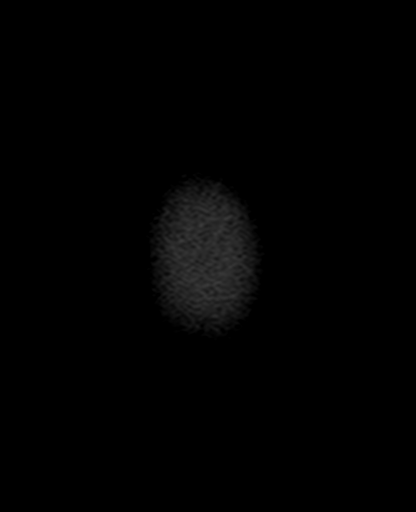

[Series 16: mag_images · axial · 3.0mm · 0.90mm/px · z∈[-107,+32]mm · 4 of 48 slices shown]
[im 1/48]
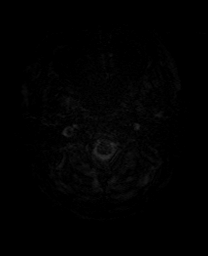
[im 16/48]
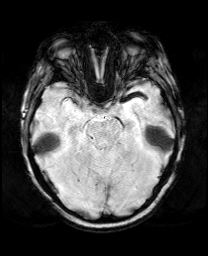
[im 32/48]
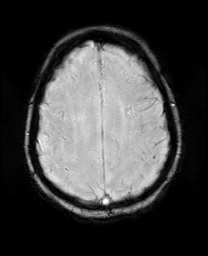
[im 48/48]
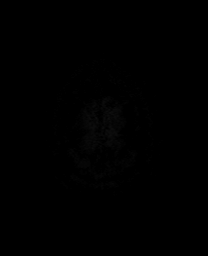

[Series 17: pha_images · axial · 3.0mm · 0.90mm/px · z∈[-107,+29]mm · 4 of 47 slices shown]
[im 1/47]
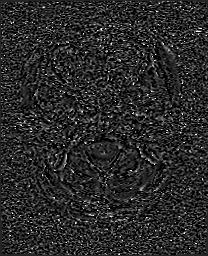
[im 16/47]
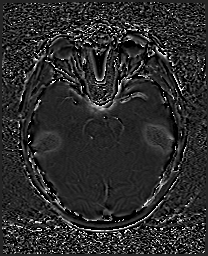
[im 31/47]
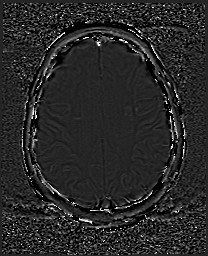
[im 47/47]
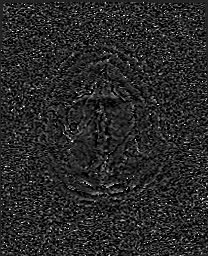

[Series 18: swi_images · axial · 3.0mm · 0.90mm/px · z∈[-107,+32]mm · 4 of 48 slices shown]
[im 1/48]
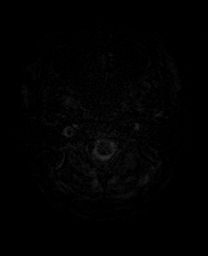
[im 16/48]
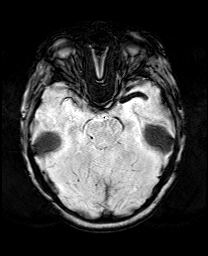
[im 32/48]
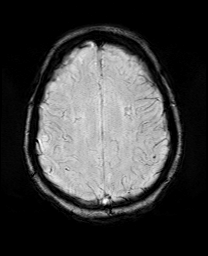
[im 48/48]
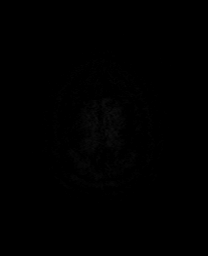

[Series 19: mip_images(sw) · axial · 24.0mm · 0.90mm/px · z∈[-97,+22]mm · 3 of 41 slices shown]
[im 1/41]
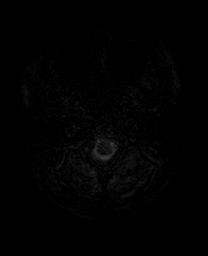
[im 21/41]
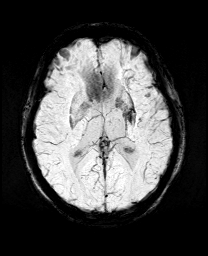
[im 41/41]
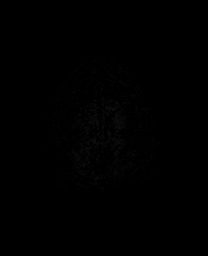

[Series 21: T2 · coronal · 5.0mm · 0.34mm/px · 2 of 29 slices shown (2 of 2)]
[im 1/29]
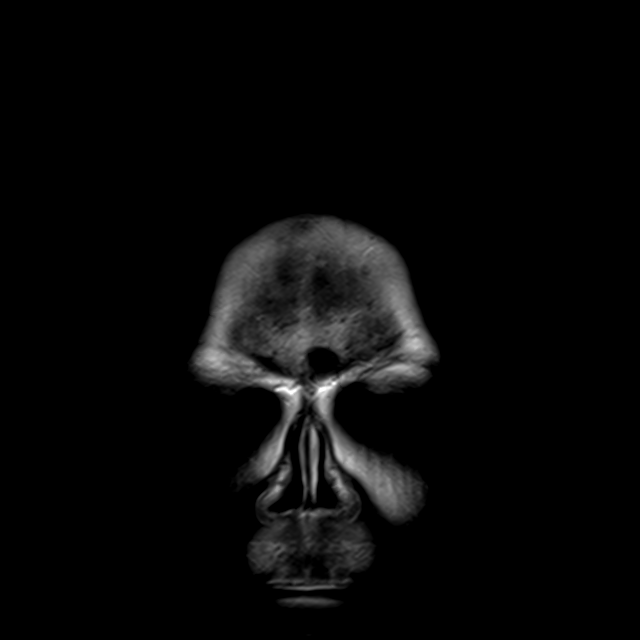
[im 29/29]
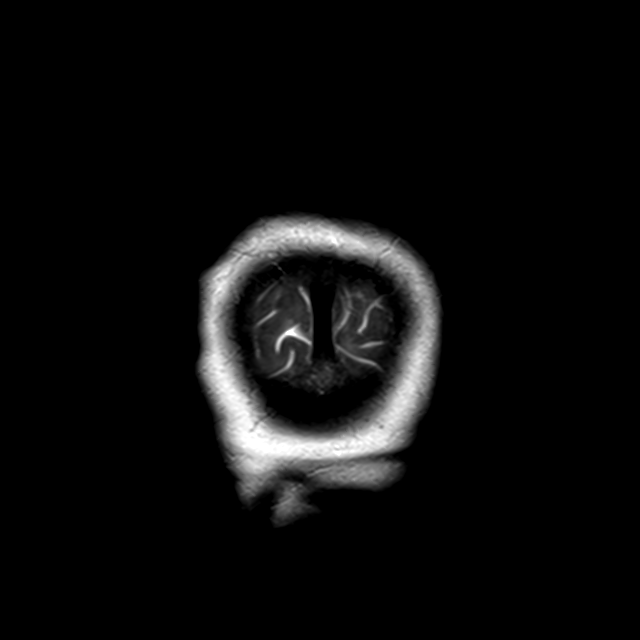

[44 of 48 positions shown; findings below may reference images not displayed]

FINDINGS: MRI HEAD FINDINGS

Brain: No acute infarction, hemorrhage, hydrocephalus, extra-axial
collection or mass lesion. Areas of encephalomalacia and gliosis in
the anterior aspect of the left insula and frontal operculum, left
basal ganglia extending into the corona radiata. Small remote
infarcts are also seen in the bilateral centrum semiovale. T2
hyperintensity along the posterior limb of internal capsule and
along the cortical spinal tract in the brainstem, consistent with
wallerian degeneration. There are no new infarcts since prior MRI.

Vascular: Absent flow void in the left vertebral artery and decrease
caliber of left MCA branches. See MR angiogram report below.

Skull and upper cervical spine: Normal marrow signal.

Sinuses/Orbits: No acute or significant finding.

Other: None.

MRA HEAD FINDINGS

Anterior circulation: Normal caliber and flow related enhancement in
the visualized upper cervical and intracranial segments of the
bilateral internal carotid arteries. The right MCA and bilateral ACA
vascular trees have course and caliber maintained. 2 mm outpouching
from the anterior communicating artery remains stable.

Status post Y stenting of the left MCA bifurcation with
susceptibility artifact limiting evaluation within the stents. Flow
related enhancement is seen in a left MCA superior and middle
division branches which appear decreased in caliber. No flow related
enhancement is seen from the inferior division stent.

Posterior circulation: Absent flow related enhancement in the
proximal aspect of the V4 segment of the left vertebral artery with
flow related enhancement seen near the vertebrobasilar junction
unchanged from prior. This may be related to high-grade stenosis
versus occlusion. Normal caliber and flow related enhancement of the
intracranial right vertebral artery, basilar artery and bilateral
posterior cerebral arteries.

Anatomic variants: None significant.
IMPRESSION: 1. No acute intracranial abnormality.
2. Remote infarcts in the left MCA territory, unchanged. No new
infarct identified from prior MRI.
3. Status post Y stenting of the left MCA bifurcation with likely
decreased flow in the superior branch and occluded inferior branch,
similar to prior.
4. Unchanged 2 mm outpouching from the A-comm complex, may represent
small aneurysm versus infundibulum.

## 2020-12-27 IMAGING — MR MR MRA HEAD W/O CM
1 series · 18 of 48 positions shown · non-contrast
Comparison: No pertinent prior exam.
COMPARISON: MRI/MRA head CT [DATE]

CLINICAL DATA: Ischemic stroke.

EXAM:
MRI HEAD WITHOUT CONTRAST
MRA HEAD WITHOUT CONTRAST
TECHNIQUE: Multiplanar, multi-echo pulse sequences of the brain and surrounding
structures were acquired without intravenous contrast. Angiographic
images of the Circle of Willis were acquired using MRA technique
without intravenous contrast.

[Series 9: 3d cow · axial · 0.5mm · 0.41mm/px · z∈[-110,-14]mm · 18 of 204 slices shown]
[im 1/204]
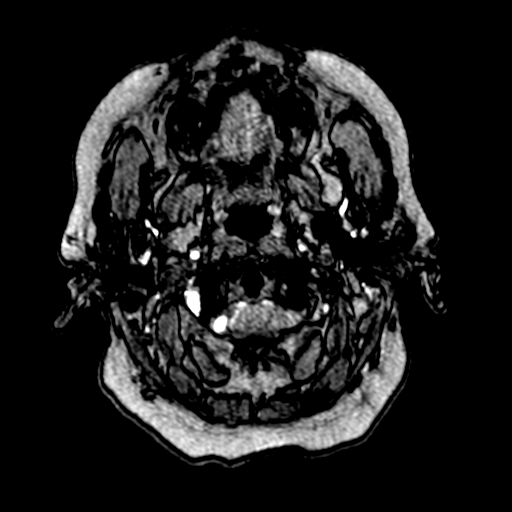
[im 5/204]
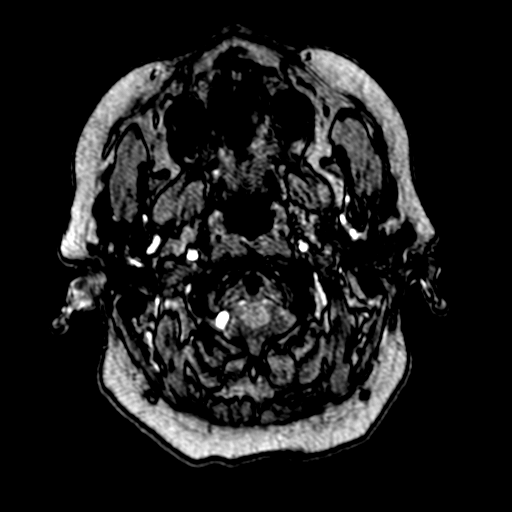
[im 9/204]
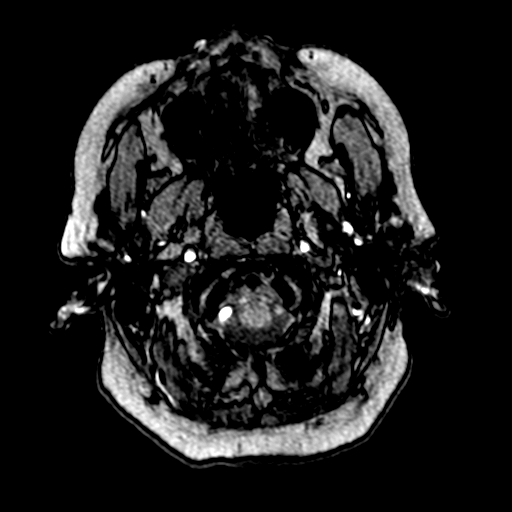
[im 13/204]
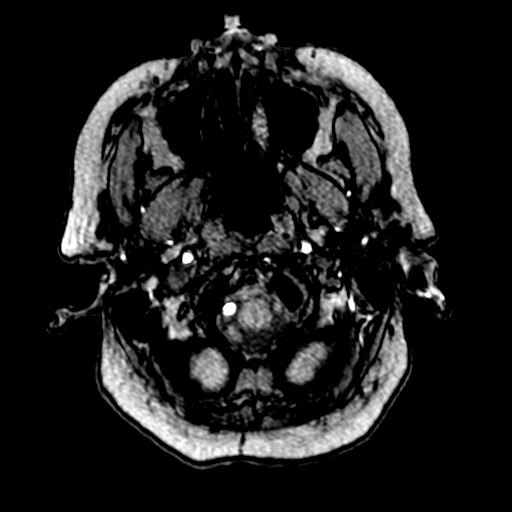
[im 18/204]
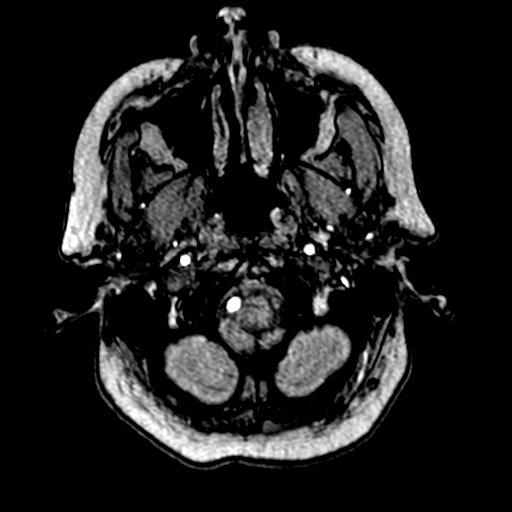
[im 22/204]
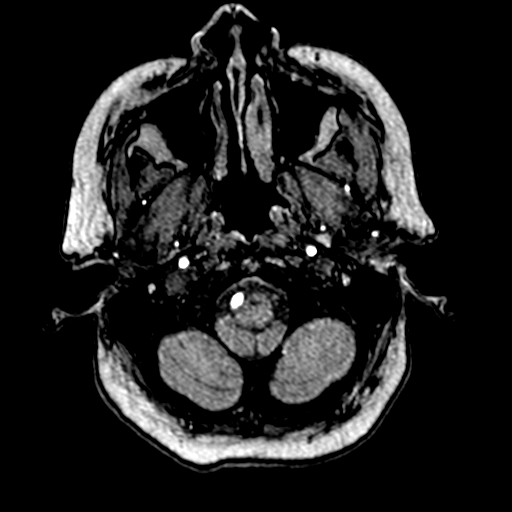
[im 26/204]
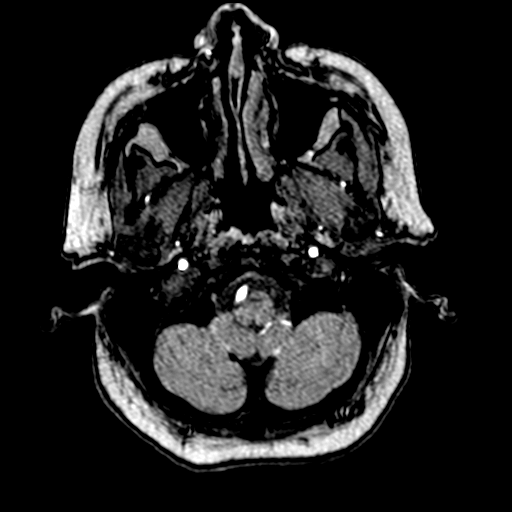
[im 31/204]
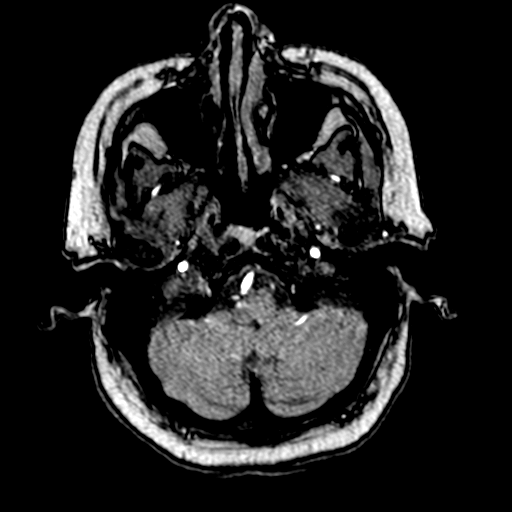
[im 35/204]
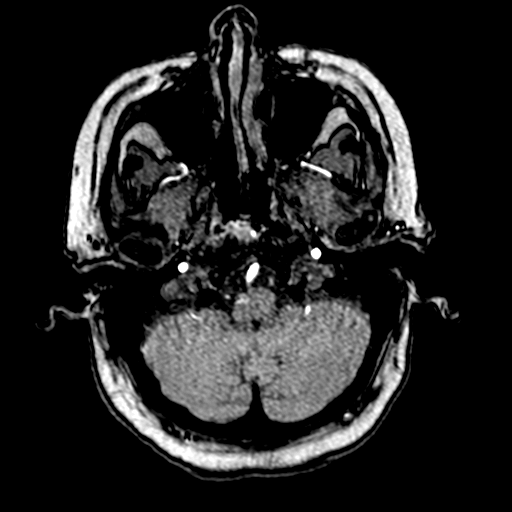
[im 39/204]
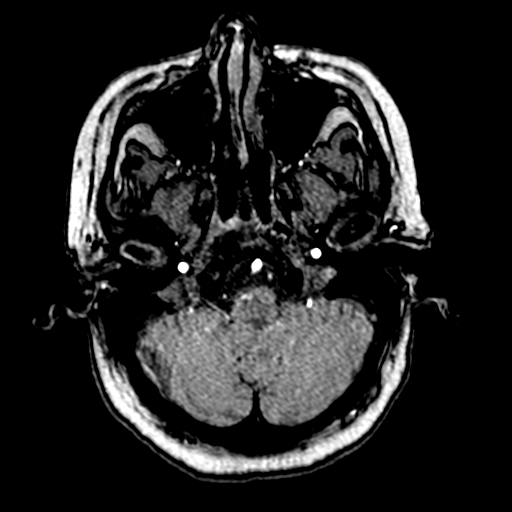
[im 65/204]
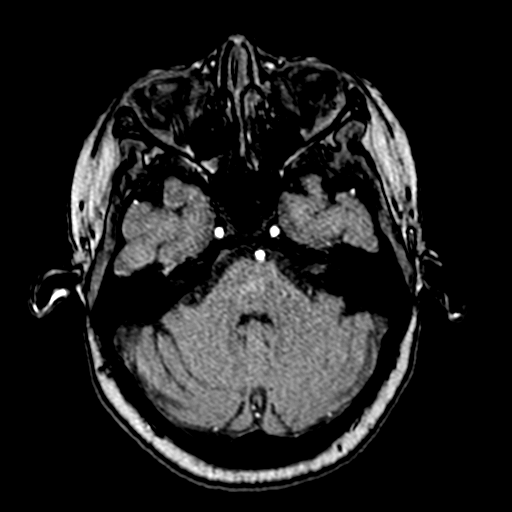
[im 91/204]
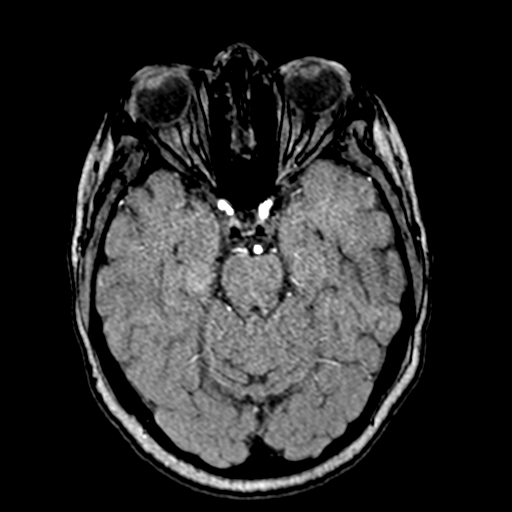
[im 104/204]
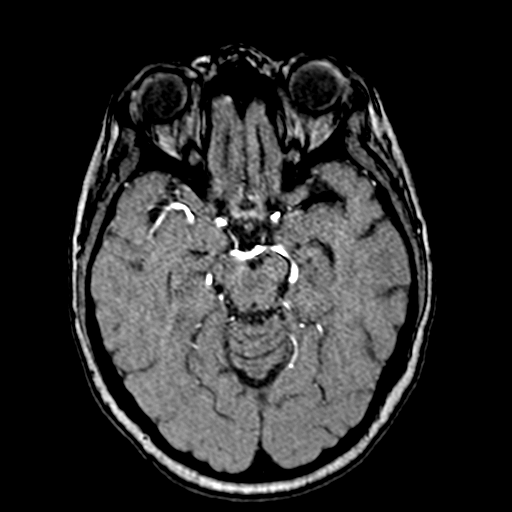
[im 117/204]
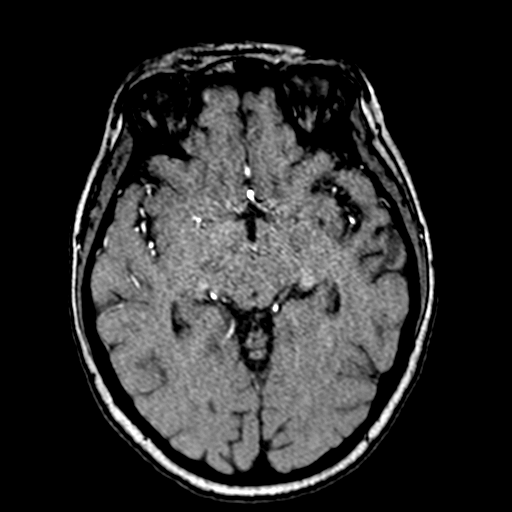
[im 143/204]
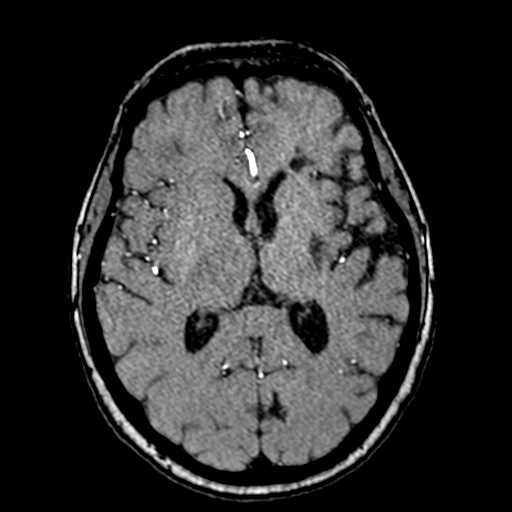
[im 169/204]
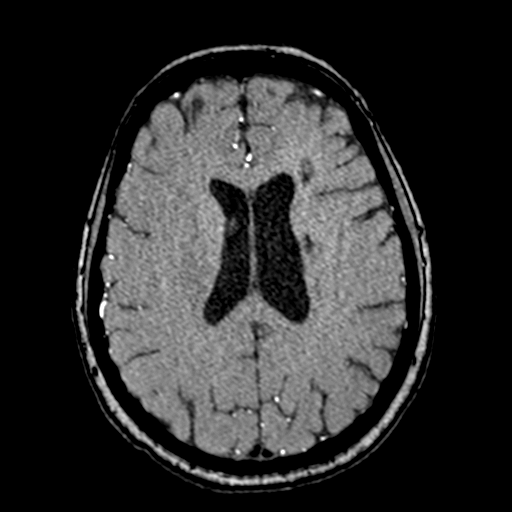
[im 173/204]
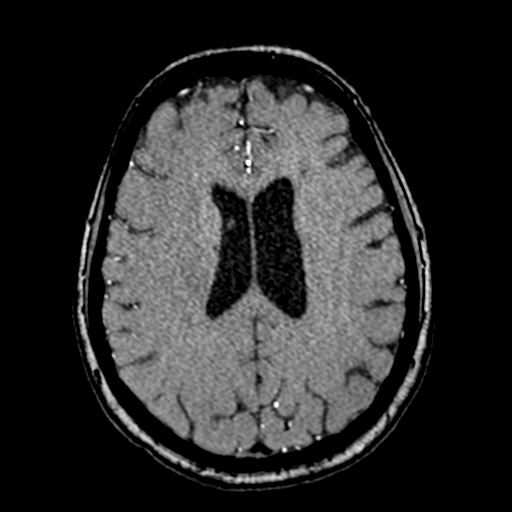
[im 195/204]
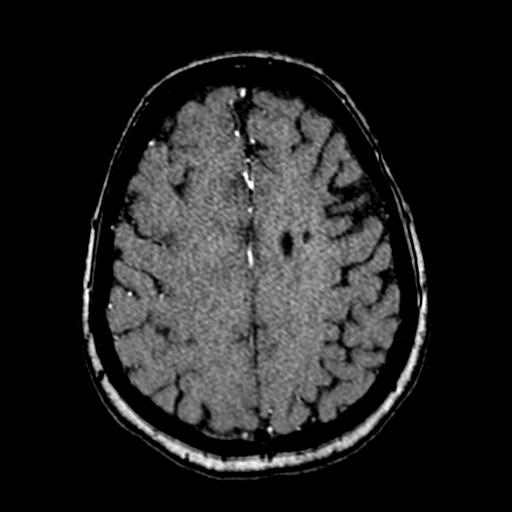

[18 of 48 positions shown; findings below may reference images not displayed]

FINDINGS: MRI HEAD FINDINGS

Brain: No acute infarction, hemorrhage, hydrocephalus, extra-axial
collection or mass lesion. Areas of encephalomalacia and gliosis in
the anterior aspect of the left insula and frontal operculum, left
basal ganglia extending into the corona radiata. Small remote
infarcts are also seen in the bilateral centrum semiovale. T2
hyperintensity along the posterior limb of internal capsule and
along the cortical spinal tract in the brainstem, consistent with
wallerian degeneration. There are no new infarcts since prior MRI.

Vascular: Absent flow void in the left vertebral artery and decrease
caliber of left MCA branches. See MR angiogram report below.

Skull and upper cervical spine: Normal marrow signal.

Sinuses/Orbits: No acute or significant finding.

Other: None.

MRA HEAD FINDINGS

Anterior circulation: Normal caliber and flow related enhancement in
the visualized upper cervical and intracranial segments of the
bilateral internal carotid arteries. The right MCA and bilateral ACA
vascular trees have course and caliber maintained. 2 mm outpouching
from the anterior communicating artery remains stable.

Status post Y stenting of the left MCA bifurcation with
susceptibility artifact limiting evaluation within the stents. Flow
related enhancement is seen in a left MCA superior and middle
division branches which appear decreased in caliber. No flow related
enhancement is seen from the inferior division stent.

Posterior circulation: Absent flow related enhancement in the
proximal aspect of the V4 segment of the left vertebral artery with
flow related enhancement seen near the vertebrobasilar junction
unchanged from prior. This may be related to high-grade stenosis
versus occlusion. Normal caliber and flow related enhancement of the
intracranial right vertebral artery, basilar artery and bilateral
posterior cerebral arteries.

Anatomic variants: None significant.
IMPRESSION: 1. No acute intracranial abnormality.
2. Remote infarcts in the left MCA territory, unchanged. No new
infarct identified from prior MRI.
3. Status post Y stenting of the left MCA bifurcation with likely
decreased flow in the superior branch and occluded inferior branch,
similar to prior.
4. Unchanged 2 mm outpouching from the A-comm complex, may represent
small aneurysm versus infundibulum.

## 2020-12-31 ENCOUNTER — Telehealth (HOSPITAL_COMMUNITY): Payer: Self-pay

## 2020-12-31 NOTE — Telephone Encounter (Signed)
Pt agreed to f/u in 6 months with mri/mra. AW  

## 2021-01-31 ENCOUNTER — Encounter: Payer: Self-pay | Admitting: Nurse Practitioner

## 2021-01-31 ENCOUNTER — Other Ambulatory Visit: Payer: Self-pay

## 2021-01-31 ENCOUNTER — Ambulatory Visit (INDEPENDENT_AMBULATORY_CARE_PROVIDER_SITE_OTHER): Payer: Medicaid Other | Admitting: Nurse Practitioner

## 2021-01-31 VITALS — BP 174/92 | HR 80 | Temp 97.6°F | Ht 62.0 in | Wt 165.0 lb

## 2021-01-31 DIAGNOSIS — Z0001 Encounter for general adult medical examination with abnormal findings: Secondary | ICD-10-CM | POA: Diagnosis not present

## 2021-01-31 DIAGNOSIS — E782 Mixed hyperlipidemia: Secondary | ICD-10-CM | POA: Diagnosis not present

## 2021-01-31 DIAGNOSIS — Z0000 Encounter for general adult medical examination without abnormal findings: Secondary | ICD-10-CM | POA: Insufficient documentation

## 2021-01-31 NOTE — Progress Notes (Signed)
Established Patient Office Visit  Subjective:  Patient ID: Erin Peck, female    DOB: [birth year]  Age: 55 y.o. MRN: [id number]  CC:  Chief Complaint  Patient presents with   Annual Exam    HPI Erin Peck presents for .   Encounter for general adult medical examination Physical: Patient's last physical exam was 1 year ago .  Weight: Appropriate for height (BMI less than 27%) ;  Blood Pressure: Normal (BP less than 120/80) ;  Medical History: Patient history reviewed ; Family history reviewed ;  Allergies Reviewed: No change in current allergies ;  Medications Reviewed: Medications reviewed - no changes ;  Lipids: Normal lipid levels ;  Smoking: Life-long non-smoker ;  Physical Activity: Exercises at least 3 times per week ;  Alcohol/Drug Use: Is a non-drinker ; No illicit drug use ;  Patient is not afflicted from Stress Incontinence and Urge Incontinence  Safety: reviewed ; Patient wears a seat belt, has smoke detectors, has carbon monoxide detectors, practices appropriate gun safety, and wears sunscreen with extended sun exposure. Dental Care: biannual cleanings, brushes and flosses daily. Ophthalmology/Optometry: Annual visit.  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   Stroke (cerebrum) (HCC) 10/09/2019    Past Surgical History:  Procedure Laterality Date   BUBBLE STUDY  10/06/2019   Procedure: BUBBLE STUDY;  Surgeon: Quintella Reichert, MD;  Location: [facility] ENDOSCOPY;  Service: Cardiovascular;;   IR CT HEAD LTD  10/03/2019   IR INTRA CRAN STENT  10/03/2019   IR PATIENT EVAL TECH 0-60 MINS  10/03/2019   IR PERCUTANEOUS ART THROMBECTOMY/INFUSION INTRACRANIAL INC DIAG ANGIO  10/03/2019   RADIOLOGY WITH ANESTHESIA N/A 10/02/2019   Procedure: IR WITH ANESTHESIA;  Surgeon: Julieanne Cotton, MD;  Location: [facility] OR;  Service: Radiology;  Laterality: N/A;   TEE WITHOUT  CARDIOVERSION N/A 10/06/2019   Procedure: TRANSESOPHAGEAL ECHOCARDIOGRAM (TEE);  Surgeon: Quintella Reichert, MD;  Location: [facility] ENDOSCOPY;  Service: Cardiovascular;  Laterality: N/A;    Family History  Problem Relation Age of Onset   Heart disease Mother    Diabetes Mother    Heart disease Father    Diabetes Father     Social History   Socioeconomic History   Marital status: Married    Spouse name: Not on file   Number of children: Not on file   Years of education: Not on file   Highest education level: Not on file  Occupational History   Not on file  Tobacco Use   Smoking status: Former    Pack years: 0.00   Smokeless tobacco: Never  Substance and Sexual Activity   Alcohol use: Not Currently   Drug use: Not Currently   Sexual activity: Not Currently  Other Topics Concern   Not on file  Social History Narrative   Not on file   Social Determinants of Health   Financial Resource Strain: Not on file  Food Insecurity: Not on file  Transportation Needs: Not on file  Physical Activity: Not on file  Stress: Not on file  Social Connections: Not on file  Intimate Partner Violence: Not on file    Outpatient Medications Prior to Visit  Medication Sig Dispense Refill   aspirin 81 MG chewable tablet Chew by mouth daily.     ticagrelor (BRILINTA) 90 MG TABS tablet Take 90 mg by mouth.  acetaminophen (TYLENOL) 325 MG tablet Take 2 tablets (650 mg total) by mouth every 4 (four) hours as needed for mild pain (or temp > 37.5 C (99.5 F)).     atorvastatin (LIPITOR) 80 MG tablet TAKE 1 TABLET (80 MG TOTAL) BY MOUTH DAILY AT 6 PM. NEEDS PASS 90 tablet 1   midodrine (PROAMATINE) 10 MG tablet TAKE 1 TABLET (10 MG TOTAL) BY MOUTH 3 (THREE) TIMES DAILY. 90 tablet 3   No facility-administered medications prior to visit.    No Known Allergies  ROS Review of Systems  Constitutional: Negative.   HENT: Negative.    Eyes: Negative.   Respiratory: Negative.    Cardiovascular:  Negative.   Gastrointestinal: Negative.   Genitourinary: Negative.   Musculoskeletal: Negative.   Skin:  Negative for rash.  Neurological:  Positive for weakness.  Psychiatric/Behavioral: Negative.    All other systems reviewed and are negative.    Objective:    Physical Exam Vitals and nursing note reviewed. Exam conducted with a chaperone present (Daughter).  Constitutional:      Appearance: Normal appearance.  HENT:     Head: Normocephalic.     Nose: Nose normal.  Eyes:     Conjunctiva/sclera: Conjunctivae normal.  Cardiovascular:     Rate and Rhythm: Normal rate and regular rhythm.     Pulses: Normal pulses.     Heart sounds: Normal heart sounds.  Pulmonary:     Effort: Pulmonary effort is normal.     Breath sounds: Normal breath sounds.  Abdominal:     General: Bowel sounds are normal.  Musculoskeletal:        General: Normal range of motion.  Skin:    Findings: No rash.  Neurological:     Mental Status: She is alert and oriented to person, place, and time.     Motor: Weakness present.     Comments: Recent stroke, right side weakness  Psychiatric:        Behavior: Behavior normal.    BP (!) 174/92   Pulse 80   Temp 97.6 F (36.4 C) (Temporal)   Ht 5\' 2"  (1.575 m)   Wt 165 lb (74.8 kg)   SpO2 97%   BMI 30.18 kg/m  Wt Readings from Last 3 Encounters:  01/31/21 165 lb (74.8 kg)  10/28/20 156 lb (70.8 kg)  01/22/20 152 lb (68.9 kg)     Health Maintenance Due  Topic Date Due   Hepatitis C Screening  Never done   Zoster Vaccines- Shingrix (1 of 2) Never done    There are no preventive care reminders to display for this patient.  Lab Results  Component Value Date   TSH 1.946 10/04/2019   Lab Results  Component Value Date   WBC 8.8 02/28/2020   HGB 13.6 02/28/2020   HCT 41.2 02/28/2020   MCV 88 02/28/2020   PLT 337 02/28/2020   Lab Results  Component Value Date   NA 138 02/28/2020   K 4.7 02/28/2020   CO2 24 02/28/2020   GLUCOSE 81  02/28/2020   BUN 11 02/28/2020   CREATININE 1.05 (H) 02/28/2020   BILITOT 0.5 02/28/2020   ALKPHOS 129 (H) 02/28/2020   AST 51 (H) 02/28/2020   ALT 73 (H) 02/28/2020   PROT 7.5 02/28/2020   ALBUMIN 5.1 (H) 02/28/2020   CALCIUM 10.4 (H) 02/28/2020   ANIONGAP 10 10/12/2019   Lab Results  Component Value Date   CHOL 236 (H) 10/03/2019   Lab Results  Component  Value Date   HDL 49 10/03/2019   Lab Results  Component Value Date   LDLCALC 152 (H) 10/03/2019   Lab Results  Component Value Date   TRIG 187 (H) 10/03/2019   Lab Results  Component Value Date   CHOLHDL 4.8 10/03/2019   Lab Results  Component Value Date   HGBA1C 5.3 10/03/2019      Assessment & Plan:   Problem List Items Addressed This Visit       Other   Hyperlipidemia - Primary    Completed lipid panel results pending.  Education provided on low-cholesterol diet weight loss and exercise as tolerated.       Relevant Medications   aspirin 81 MG chewable tablet   Other Relevant Orders   Lipid Panel   CBC with Differential   Comprehensive metabolic panel   Annual physical exam    Completed annual physical exam.  Patient is noted to have right-sided weakness from recent stroke.  Patient is gradually returning to baseline.  She has been followed up by neurology.  Completed labs today CBC, CMP, lipid panel.  Cologuard recorded. Patient refuses COVID vaccines, Pap, and mammogram.  She states she is happy to just be alive and has had too many hospital procedures during her stroke and would prefer not to do anything more at the moment.       Relevant Orders   Cologuard    No orders of the defined types were placed in this encounter.   Follow-up: Return in about 1 year (around 01/31/2022) for Physical, 3 months chronic disease management.    Daryll Drown, NP

## 2021-01-31 NOTE — Patient Instructions (Signed)
Health Maintenance, Female Adopting a healthy lifestyle and getting preventive care are important in promoting health and wellness. Ask your health care provider about: The right schedule for you to have regular tests and exams. Things you can do on your own to prevent diseases and keep yourself healthy. What should I know about diet, weight, and exercise? Eat a healthy diet  Eat a diet that includes plenty of vegetables, fruits, low-fat dairy products, and lean protein. Do not eat a lot of foods that are high in solid fats, added sugars, or sodium.  Maintain a healthy weight Body mass index (BMI) is used to identify weight problems. It estimates body fat based on height and weight. Your health care provider can help determineyour BMI and help you achieve or maintain a healthy weight. Get regular exercise Get regular exercise. This is one of the most important things you can do for your health. Most adults should: Exercise for at least 150 minutes each week. The exercise should increase your heart rate and make you sweat (moderate-intensity exercise). Do strengthening exercises at least twice a week. This is in addition to the moderate-intensity exercise. Spend less time sitting. Even light physical activity can be beneficial. Watch cholesterol and blood lipids Have your blood tested for lipids and cholesterol at 53 years of age, then havethis test every 5 years. Have your cholesterol levels checked more often if: Your lipid or cholesterol levels are high. You are older than 53 years of age. You are at high risk for heart disease. What should I know about cancer screening? Depending on your health history and family history, you may need to have cancer screening at various ages. This may include screening for: Breast cancer. Cervical cancer. Colorectal cancer. Skin cancer. Lung cancer. What should I know about heart disease, diabetes, and high blood pressure? Blood pressure and heart  disease High blood pressure causes heart disease and increases the risk of stroke. This is more likely to develop in people who have high blood pressure readings, are of African descent, or are overweight. Have your blood pressure checked: Every 3-5 years if you are 18-39 years of age. Every year if you are 40 years old or older. Diabetes Have regular diabetes screenings. This checks your fasting blood sugar level. Have the screening done: Once every three years after age 40 if you are at a normal weight and have a low risk for diabetes. More often and at a younger age if you are overweight or have a high risk for diabetes. What should I know about preventing infection? Hepatitis B If you have a higher risk for hepatitis B, you should be screened for this virus. Talk with your health care provider to find out if you are at risk forhepatitis B infection. Hepatitis C Testing is recommended for: Everyone born from 1945 through 1965. Anyone with known risk factors for hepatitis C. Sexually transmitted infections (STIs) Get screened for STIs, including gonorrhea and chlamydia, if: You are sexually active and are younger than 53 years of age. You are older than 53 years of age and your health care provider tells you that you are at risk for this type of infection. Your sexual activity has changed since you were last screened, and you are at increased risk for chlamydia or gonorrhea. Ask your health care provider if you are at risk. Ask your health care provider about whether you are at high risk for HIV. Your health care provider may recommend a prescription medicine to help   prevent HIV infection. If you choose to take medicine to prevent HIV, you should first get tested for HIV. You should then be tested every 3 months for as long as you are taking the medicine. Pregnancy If you are about to stop having your period (premenopausal) and you may become pregnant, seek counseling before you get  pregnant. Take 400 to 800 micrograms (mcg) of folic acid every day if you become pregnant. Ask for birth control (contraception) if you want to prevent pregnancy. Osteoporosis and menopause Osteoporosis is a disease in which the bones lose minerals and strength with aging. This can result in bone fractures. If you are 65 years old or older, or if you are at risk for osteoporosis and fractures, ask your health care provider if you should: Be screened for bone loss. Take a calcium or vitamin D supplement to lower your risk of fractures. Be given hormone replacement therapy (HRT) to treat symptoms of menopause. Follow these instructions at home: Lifestyle Do not use any products that contain nicotine or tobacco, such as cigarettes, e-cigarettes, and chewing tobacco. If you need help quitting, ask your health care provider. Do not use street drugs. Do not share needles. Ask your health care provider for help if you need support or information about quitting drugs. Alcohol use Do not drink alcohol if: Your health care provider tells you not to drink. You are pregnant, may be pregnant, or are planning to become pregnant. If you drink alcohol: Limit how much you use to 0-1 drink a day. Limit intake if you are breastfeeding. Be aware of how much alcohol is in your drink. In the [place] one drink equals one 12 oz bottle of beer (355 mL), one 5 oz glass of wine (148 mL), or one 1 oz glass of hard liquor (44 mL). General instructions Schedule regular health, dental, and eye exams. Stay current with your vaccines. Tell your health care provider if: You often feel depressed. You have ever been abused or do not feel safe at home. Summary Adopting a healthy lifestyle and getting preventive care are important in promoting health and wellness. Follow your health care provider's instructions about healthy diet, exercising, and getting tested or screened for diseases. Follow your health care provider's  instructions on monitoring your cholesterol and blood pressure. This information is not intended to replace advice given to you by your health care provider. Make sure you discuss any questions you have with your healthcare provider. Document Revised: 07/06/2018 Document Reviewed: 07/06/2018 Elsevier Patient Education  2022 [employer]  

## 2021-01-31 NOTE — Assessment & Plan Note (Signed)
Completed lipid panel results pending.  Education provided on low-cholesterol diet weight loss and exercise as tolerated.

## 2021-01-31 NOTE — Assessment & Plan Note (Signed)
Completed annual physical exam.  Patient is noted to have right-sided weakness from recent stroke.  Patient is gradually returning to baseline.  She has been followed up by neurology.  Completed labs today CBC, CMP, lipid panel.  Cologuard recorded. Patient refuses COVID vaccines, Pap, and mammogram.  She states she is happy to just be alive and has had too many hospital procedures during her stroke and would prefer not to do anything more at the moment.

## 2021-02-01 LAB — LIPID PANEL
Chol/HDL Ratio: 2.6 ratio (ref 0.0–4.4)
Cholesterol, Total: 168 mg/dL (ref 100–199)
HDL: 64 mg/dL (ref >39)
LDL Chol Calc (NIH): 84 mg/dL (ref 0–99)
Triglycerides: 116 mg/dL (ref 0–149)
VLDL Cholesterol Cal: 20 mg/dL (ref 5–40)

## 2021-02-01 LAB — CBC WITH DIFFERENTIAL/PLATELET
Basophils Absolute: 0.1 10*3/uL (ref 0.0–0.2)
Basos: 1 %
EOS (ABSOLUTE): 0.2 10*3/uL (ref 0.0–0.4)
Eos: 3 %
Hematocrit: 41.5 % (ref 34.0–46.6)
Hemoglobin: 13.8 g/dL (ref 11.1–15.9)
Immature Grans (Abs): 0 10*3/uL (ref 0.0–0.1)
Immature Granulocytes: 0 %
Lymphocytes Absolute: 2.3 10*3/uL (ref 0.7–3.1)
Lymphs: 29 %
MCH: 30.1 pg (ref 26.6–33.0)
MCHC: 33.3 g/dL (ref 31.5–35.7)
MCV: 90 fL (ref 79–97)
Monocytes Absolute: 0.5 10*3/uL (ref 0.1–0.9)
Monocytes: 7 %
Neutrophils Absolute: 4.7 10*3/uL (ref 1.4–7.0)
Neutrophils: 60 %
Platelets: 332 10*3/uL (ref 150–450)
RBC: 4.59 x10E6/uL (ref 3.77–5.28)
RDW: 13 % (ref 11.7–15.4)
WBC: 7.7 10*3/uL (ref 3.4–10.8)

## 2021-02-01 LAB — COMPREHENSIVE METABOLIC PANEL
ALT: 64 IU/L — ABNORMAL HIGH (ref 0–32)
AST: 51 IU/L — ABNORMAL HIGH (ref 0–40)
Albumin/Globulin Ratio: 2 (ref 1.2–2.2)
Albumin: 5.1 g/dL — ABNORMAL HIGH (ref 3.8–4.9)
Alkaline Phosphatase: 120 IU/L (ref 44–121)
BUN/Creatinine Ratio: 14 (ref 9–23)
BUN: 13 mg/dL (ref 6–24)
Bilirubin Total: 0.6 mg/dL (ref 0.0–1.2)
CO2: 22 mmol/L (ref 20–29)
Calcium: 10.3 mg/dL — ABNORMAL HIGH (ref 8.7–10.2)
Chloride: 101 mmol/L (ref 96–106)
Creatinine, Ser: 0.93 mg/dL (ref 0.57–1.00)
Globulin, Total: 2.5 g/dL (ref 1.5–4.5)
Glucose: 105 mg/dL — ABNORMAL HIGH (ref 65–99)
Potassium: 5 mmol/L (ref 3.5–5.2)
Sodium: 139 mmol/L (ref 134–144)
Total Protein: 7.6 g/dL (ref 6.0–8.5)
eGFR: 74 mL/min/{1.73_m2} (ref >59)

## 2021-02-20 ENCOUNTER — Telehealth: Payer: Self-pay | Admitting: Nurse Practitioner

## 2021-02-20 ENCOUNTER — Encounter: Payer: Self-pay | Admitting: Nurse Practitioner

## 2021-02-20 MED ORDER — MIDODRINE HCL 10 MG PO TABS: ORAL_TABLET | Freq: Three times a day (TID) | ORAL | 3 refills | Status: DC

## 2021-02-20 NOTE — Telephone Encounter (Signed)
Medication has been denied due to requesting refill too soon.  #90 with 3 refills were prescribed in April.  Pharmacy notified by fax

## 2021-02-20 NOTE — Telephone Encounter (Signed)
  Prescription Request  02/20/2021  What is the name of the medication or equipment? midodrine  Have you contacted your pharmacy to request a refill? (if applicable) yes  Which pharmacy would you like this sent to? [facility] in [place]    Patient notified that their request is being sent to the clinical staff for review and that they should receive a response within 2 business days.

## 2021-02-24 ENCOUNTER — Other Ambulatory Visit: Payer: Self-pay

## 2021-02-25 ENCOUNTER — Other Ambulatory Visit: Payer: Self-pay

## 2021-03-23 ENCOUNTER — Encounter: Payer: Self-pay | Admitting: Nurse Practitioner

## 2021-03-24 MED ORDER — TICAGRELOR 90 MG PO TABS: 90.0000 mg | ORAL_TABLET | Freq: Two times a day (BID) | ORAL | 1 refills | Status: DC

## 2021-04-16 DIAGNOSIS — Z0271 Encounter for disability determination: Secondary | ICD-10-CM

## 2021-05-06 ENCOUNTER — Encounter: Payer: Self-pay | Admitting: Nurse Practitioner

## 2021-05-06 ENCOUNTER — Ambulatory Visit (INDEPENDENT_AMBULATORY_CARE_PROVIDER_SITE_OTHER): Payer: Medicaid Other | Admitting: Nurse Practitioner

## 2021-05-06 ENCOUNTER — Other Ambulatory Visit: Payer: Self-pay

## 2021-05-06 VITALS — BP 164/79 | HR 70 | Temp 97.6°F | Ht 62.0 in | Wt 173.0 lb

## 2021-05-06 DIAGNOSIS — I10 Essential (primary) hypertension: Secondary | ICD-10-CM

## 2021-05-06 DIAGNOSIS — G43109 Migraine with aura, not intractable, without status migrainosus: Secondary | ICD-10-CM

## 2021-05-06 DIAGNOSIS — I6930 Unspecified sequelae of cerebral infarction: Secondary | ICD-10-CM

## 2021-05-06 DIAGNOSIS — I9589 Other hypotension: Secondary | ICD-10-CM | POA: Diagnosis not present

## 2021-05-06 DIAGNOSIS — I63512 Cerebral infarction due to unspecified occlusion or stenosis of left middle cerebral artery: Secondary | ICD-10-CM

## 2021-05-06 DIAGNOSIS — E782 Mixed hyperlipidemia: Secondary | ICD-10-CM

## 2021-05-06 NOTE — Progress Notes (Signed)
Established Patient Office Visit  Subjective:  Patient ID: Erin Peck, female    DOB: [birth year]  Age: 55 y.o. MRN: [id number]  CC:  Chief Complaint  Patient presents with   Medical Management of Chronic Issues    HPI Erin Peck presents for  hypotension, diagnosed in 2021, no new symptoms reported,  Patient is well controlled. Patient continue to report right sided weakness. No migraine or double vision. Permissive hypertension recommended by neurologist.  No new  stroke like symptoms. Patient is compliant with follow up appointments.      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   Stroke (cerebrum) ([place]) 10/09/2019    Past Surgical History:  Procedure Laterality Date   BUBBLE STUDY  10/06/2019   Procedure: BUBBLE STUDY;  Surgeon: Sueanne Margarita, MD;  Location: [facility] ENDOSCOPY;  Service: Cardiovascular;;   IR CT HEAD LTD  10/03/2019   IR INTRA CRAN STENT  10/03/2019   IR PATIENT EVAL TECH 0-60 MINS  10/03/2019   IR PERCUTANEOUS ART THROMBECTOMY/INFUSION INTRACRANIAL INC DIAG ANGIO  10/03/2019   RADIOLOGY WITH ANESTHESIA N/A 10/02/2019   Procedure: IR WITH ANESTHESIA;  Surgeon: Luanne Bras, MD;  Location: [place];  Service: Radiology;  Laterality: N/A;   TEE WITHOUT CARDIOVERSION N/A 10/06/2019   Procedure: TRANSESOPHAGEAL ECHOCARDIOGRAM (TEE);  Surgeon: Sueanne Margarita, MD;  Location: [facility] ENDOSCOPY;  Service: Cardiovascular;  Laterality: N/A;    Family History  Problem Relation Age of Onset   Heart disease Mother    Diabetes Mother    Heart disease Father    Diabetes Father     Social History   Socioeconomic History   Marital status: Married    Spouse name: Not on file   Number of children: Not on file   Years of education: Not on file   Highest education level: Not on file  Occupational History   Not on file  Tobacco Use   Smoking status: Former   Smokeless tobacco: Never  Substance and  Sexual Activity   Alcohol use: Not Currently   Drug use: Not Currently   Sexual activity: Not Currently  Other Topics Concern   Not on file  Social History Narrative   Not on file   Social Determinants of Health   Financial Resource Strain: Not on file  Food Insecurity: Not on file  Transportation Needs: Not on file  Physical Activity: Not on file  Stress: Not on file  Social Connections: Not on file  Intimate Partner Violence: Not on file    Outpatient Medications Prior to Visit  Medication Sig Dispense Refill   acetaminophen (TYLENOL) 325 MG tablet Take 2 tablets (650 mg total) by mouth every 4 (four) hours as needed for mild pain (or temp > 37.5 C (99.5 F)).     atorvastatin (LIPITOR) 80 MG tablet TAKE 1 TABLET (80 MG TOTAL) BY MOUTH DAILY AT 6 PM. NEEDS PASS 90 tablet 1   midodrine (PROAMATINE) 10 MG tablet TAKE 1 TABLET (10 MG TOTAL) BY MOUTH 3 (THREE) TIMES DAILY. 90 tablet 3   ticagrelor (BRILINTA) 90 MG TABS tablet Take 1 tablet (90 mg total) by mouth 2 (two) times daily. 60 tablet 1   No facility-administered medications prior to visit.    No Known Allergies  ROS Review of Systems  Constitutional: Negative.   HENT: Negative.    Respiratory: Negative.    Cardiovascular: Negative.  Skin: Negative.   Neurological:  Positive for weakness.  Hematological: Negative.   Psychiatric/Behavioral: Negative.    All other systems reviewed and are negative.    Objective:    Physical Exam Vitals and nursing note reviewed.  Constitutional:      Appearance: Normal appearance.  HENT:     Head: Normocephalic.     Nose: Nose normal.  Eyes:     Conjunctiva/sclera: Conjunctivae normal.  Cardiovascular:     Rate and Rhythm: Normal rate and regular rhythm.     Pulses: Normal pulses.     Heart sounds: Normal heart sounds.  Pulmonary:     Effort: Pulmonary effort is normal.     Breath sounds: Normal breath sounds.  Abdominal:     General: Bowel sounds are normal.   Skin:    General: Skin is warm.     Findings: No rash.  Neurological:     Mental Status: She is alert and oriented to person, place, and time.  Psychiatric:        Behavior: Behavior normal.    BP (!) 164/79 Comment: Neurologist wants BP to stay high  Pulse 70   Temp 97.6 F (36.4 C) (Temporal)   Ht 5' 2"  (1.575 m)   Wt 173 lb (78.5 kg)   SpO2 98%   BMI 31.64 kg/m  Wt Readings from Last 3 Encounters:  05/06/21 173 lb (78.5 kg)  01/31/21 165 lb (74.8 kg)  10/28/20 156 lb (70.8 kg)     Health Maintenance Due  Topic Date Due   Hepatitis C Screening  Never done   PAP SMEAR-Modifier  Never done   Zoster Vaccines- Shingrix (1 of 2) Never done    There are no preventive care reminders to display for this patient.  Lab Results  Component Value Date   TSH 1.946 10/04/2019   Lab Results  Component Value Date   WBC 7.7 01/31/2021   HGB 13.8 01/31/2021   HCT 41.5 01/31/2021   MCV 90 01/31/2021   PLT 332 01/31/2021   Lab Results  Component Value Date   NA 139 01/31/2021   K 5.0 01/31/2021   CO2 22 01/31/2021   GLUCOSE 105 (H) 01/31/2021   BUN 13 01/31/2021   CREATININE 0.93 01/31/2021   BILITOT 0.6 01/31/2021   ALKPHOS 120 01/31/2021   AST 51 (H) 01/31/2021   ALT 64 (H) 01/31/2021   PROT 7.6 01/31/2021   ALBUMIN 5.1 (H) 01/31/2021   CALCIUM 10.3 (H) 01/31/2021   ANIONGAP 10 10/12/2019   EGFR 74 01/31/2021   Lab Results  Component Value Date   CHOL 168 01/31/2021   Lab Results  Component Value Date   HDL 64 01/31/2021   Lab Results  Component Value Date   LDLCALC 84 01/31/2021   Lab Results  Component Value Date   TRIG 116 01/31/2021   Lab Results  Component Value Date   CHOLHDL 2.6 01/31/2021   Lab Results  Component Value Date   HGBA1C 5.3 10/03/2019      Assessment & Plan:   Problem List Items Addressed This Visit       Cardiovascular and Mediastinum   Hypotension - Primary    Well controlled on current no changes necessary.  Follow up in 3-6 months.      Accelerated hypertension   Relevant Orders   CBC with Differential   Comprehensive metabolic panel   Lipid Panel   Complicated migraine    No new symptoms of migraine.  Left middle cerebral artery stroke [facility])    Patient is gradually regaining strength but still has right sided weakness. Neurology appointments scheduled for December 3rd. Last visit showed no worsening or improved results.         Other   Hyperlipidemia    Completed lipid panel results pending. No new symptoms of hyperlipidemia. Continue low cholesterol diet.       No orders of the defined types were placed in this encounter.   Follow-up: Return in about 6 months (around 11/04/2021), or if symptoms worsen or fail to improve.    Ivy Lynn, NP

## 2021-05-06 NOTE — Assessment & Plan Note (Signed)
Well controlled on current no changes necessary. Follow up in 3-6 months.

## 2021-05-06 NOTE — Assessment & Plan Note (Signed)
Completed lipid panel results pending. No new symptoms of hyperlipidemia. Continue low cholesterol diet.

## 2021-05-06 NOTE — Patient Instructions (Signed)
Hypotension As your heart beats, it forces blood through your body. This force is called blood pressure. If you have hypotension, you have low blood pressure. When your blood pressure is too low, you may not get enough blood to your brain or other parts of your body. This may cause you to feel weak, light-headed, have a fast heartbeat, or even pass out (faint). Low blood pressure may be harmless, or it may cause serious problems. What are the causes? Blood loss. Not enough water in the body (dehydration). Heart problems. Hormone problems. Pregnancy. A very bad infection. Not having enough of certain nutrients. Very bad allergic reactions. Certain medicines. What increases the risk? Age. The risk increases as you get older. Conditions that affect the heart or the brain and spinal cord (central nervous system). Taking certain medicines. Being pregnant. What are the signs or symptoms? Feeling: Weak. Light-headed. Dizzy. Tired (fatigued). Blurred vision. Fast heartbeat. Passing out, in very bad cases. How is this treated? Changing your diet. This may involve eating more salt (sodium) or drinking more water. Taking medicines to raise your blood pressure. Changing how much you take (the dosage) of some of your medicines. Wearing compression stockings. These stockings help to prevent blood clots and reduce swelling in your legs. In some cases, you may need to go to the hospital for: Fluid replacement. This means you will receive fluids through an IV tube. Blood replacement. This means you will receive donated blood through an IV tube (transfusion). Treating an infection or heart problems, if this applies. Monitoring. You may need to be monitored while medicines that you are taking wear off. Follow these instructions at home: Eating and drinking  Drink enough fluids to keep your pee (urine) pale yellow. Eat a healthy diet. Follow instructions from your doctor about what you can eat or  drink. A healthy diet includes: Fresh fruits and vegetables. Whole grains. Low-fat (lean) meats. Low-fat dairy products. Eat extra salt only as told. Do not add extra salt to your diet unless your doctor tells you to. Eat small meals often. Avoid standing up quickly after you eat.  Medicines Take over-the-counter and prescription medicines only as told by your doctor. Follow instructions from your doctor about changing how much you take of your medicines, if this applies. Do not stop or change any of your medicines on your own. General instructions  Wear compression stockings as told by your doctor. Get up slowly from lying down or sitting. Avoid hot showers and a lot of heat as told by your doctor. Return to your normal activities as told by your doctor. Ask what activities are safe for you. Do not use any products that contain nicotine or tobacco, such as cigarettes, e-cigarettes, and chewing tobacco. If you need help quitting, ask your doctor. Keep all follow-up visits as told by your doctor. This is important.  Contact a doctor if: You throw up (vomit). You have watery poop (diarrhea). You have a fever for more than 2-3 days. You feel more thirsty than normal. You feel weak and tired. Get help right away if: You have chest pain. You have a fast or uneven heartbeat. You lose feeling (have numbness) in any part of your body. You cannot move your arms or your legs. You have trouble talking. You get sweaty or feel light-headed. You pass out. You have trouble breathing. You have trouble staying awake. You feel mixed up (confused). Summary Hypotension is also called low blood pressure. It is when the force of   blood pumping through your arteries is too weak. Hypotension may be harmless, or it may cause serious problems. Treatment may include changing your diet and medicines, and wearing compression stockings. In very bad cases, you may need to go to the hospital. This  information is not intended to replace advice given to you by your health care provider. Make sure you discuss any questions you have with your healthcare provider. Document Revised: 01/06/2018 Document Reviewed: 01/06/2018 Elsevier Patient Education  2022 [employer] Inc[employer]  

## 2021-05-06 NOTE — Assessment & Plan Note (Signed)
Patient is gradually regaining strength but still has right sided weakness. Neurology appointments scheduled for December 3rd. Last visit showed no worsening or improved results.

## 2021-05-06 NOTE — Assessment & Plan Note (Signed)
No new symptoms of migraine.

## 2021-05-07 LAB — CBC WITH DIFFERENTIAL/PLATELET
Basophils Absolute: 0.1 10*3/uL (ref 0.0–0.2)
Basos: 1 %
EOS (ABSOLUTE): 0.2 10*3/uL (ref 0.0–0.4)
Eos: 2 %
Hematocrit: 42.6 % (ref 34.0–46.6)
Hemoglobin: 14 g/dL (ref 11.1–15.9)
Immature Grans (Abs): 0 10*3/uL (ref 0.0–0.1)
Immature Granulocytes: 0 %
Lymphocytes Absolute: 2.2 10*3/uL (ref 0.7–3.1)
Lymphs: 24 %
MCH: 29.9 pg (ref 26.6–33.0)
MCHC: 32.9 g/dL (ref 31.5–35.7)
MCV: 91 fL (ref 79–97)
Monocytes Absolute: 0.7 10*3/uL (ref 0.1–0.9)
Monocytes: 8 %
Neutrophils Absolute: 6 10*3/uL (ref 1.4–7.0)
Neutrophils: 65 %
Platelets: 348 10*3/uL (ref 150–450)
RBC: 4.69 x10E6/uL (ref 3.77–5.28)
RDW: 12.7 % (ref 11.7–15.4)
WBC: 9.2 10*3/uL (ref 3.4–10.8)

## 2021-05-07 LAB — COMPREHENSIVE METABOLIC PANEL
ALT: 36 IU/L — ABNORMAL HIGH (ref 0–32)
AST: 26 IU/L (ref 0–40)
Albumin/Globulin Ratio: 1.8 (ref 1.2–2.2)
Albumin: 5 g/dL — ABNORMAL HIGH (ref 3.8–4.9)
Alkaline Phosphatase: 112 IU/L (ref 44–121)
BUN/Creatinine Ratio: 13 (ref 9–23)
BUN: 13 mg/dL (ref 6–24)
Bilirubin Total: 0.5 mg/dL (ref 0.0–1.2)
CO2: 25 mmol/L (ref 20–29)
Calcium: 10.6 mg/dL — ABNORMAL HIGH (ref 8.7–10.2)
Chloride: 103 mmol/L (ref 96–106)
Creatinine, Ser: 0.97 mg/dL (ref 0.57–1.00)
Globulin, Total: 2.8 g/dL (ref 1.5–4.5)
Glucose: 72 mg/dL (ref 70–99)
Potassium: 5.1 mmol/L (ref 3.5–5.2)
Sodium: 141 mmol/L (ref 134–144)
Total Protein: 7.8 g/dL (ref 6.0–8.5)
eGFR: 70 mL/min/{1.73_m2} (ref >59)

## 2021-05-07 LAB — LIPID PANEL
Chol/HDL Ratio: 3 ratio (ref 0.0–4.4)
Cholesterol, Total: 161 mg/dL (ref 100–199)
HDL: 54 mg/dL (ref >39)
LDL Chol Calc (NIH): 83 mg/dL (ref 0–99)
Triglycerides: 137 mg/dL (ref 0–149)
VLDL Cholesterol Cal: 24 mg/dL (ref 5–40)

## 2021-05-16 ENCOUNTER — Other Ambulatory Visit: Payer: Self-pay | Admitting: Nurse Practitioner

## 2021-06-12 ENCOUNTER — Other Ambulatory Visit: Payer: Self-pay | Admitting: Nurse Practitioner

## 2021-06-13 ENCOUNTER — Other Ambulatory Visit: Payer: Self-pay | Admitting: Nurse Practitioner

## 2021-06-14 ENCOUNTER — Other Ambulatory Visit: Payer: Self-pay | Admitting: Nurse Practitioner

## 2021-06-17 ENCOUNTER — Other Ambulatory Visit: Payer: Self-pay | Admitting: Nurse Practitioner

## 2021-06-17 DIAGNOSIS — I63512 Cerebral infarction due to unspecified occlusion or stenosis of left middle cerebral artery: Secondary | ICD-10-CM

## 2021-09-16 ENCOUNTER — Telehealth (HOSPITAL_COMMUNITY): Payer: Self-pay

## 2021-09-16 ENCOUNTER — Other Ambulatory Visit (HOSPITAL_COMMUNITY): Payer: Self-pay | Admitting: Interventional Radiology

## 2021-09-16 DIAGNOSIS — I639 Cerebral infarction, unspecified: Secondary | ICD-10-CM

## 2021-09-16 NOTE — Telephone Encounter (Signed)
Called to schedule mri, no answer, left vm. AW 

## 2021-10-01 ENCOUNTER — Ambulatory Visit (HOSPITAL_COMMUNITY)
Admission: RE | Admit: 2021-10-01 | Discharge: 2021-10-01 | Disposition: A | Discharge status: Home / Self Care | Payer: Medicaid Other | Source: Ambulatory Visit | Attending: Interventional Radiology | Admitting: Interventional Radiology

## 2021-10-01 ENCOUNTER — Other Ambulatory Visit: Payer: Self-pay

## 2021-10-01 DIAGNOSIS — I639 Cerebral infarction, unspecified: Secondary | ICD-10-CM | POA: Insufficient documentation

## 2021-10-01 IMAGING — MR MR MRA HEAD W/O CM
9 of 12 series · 31 of 48 positions shown · IV contrast (Yes   MULTIHANCE)
Comparison: MR/MRA head [DATE]

CLINICAL DATA: Status post stenting of left MCA

EXAM:
MRI HEAD WITHOUT CONTRAST
MRA HEAD WITHOUT CONTRAST
TECHNIQUE: Multiplanar, multi-echo pulse sequences of the brain and surrounding
structures were acquired without intravenous contrast. Angiographic
images of the Circle of Willis were acquired using MRA technique
without intravenous contrast.

[Series 3: DWI · axial · 3.0mm · 1.09mm/px · z∈[-32,+111]mm · 7 of 98 slices shown (1 of 4)]
[im 1/98]
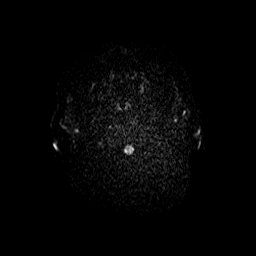
[im 17/98]
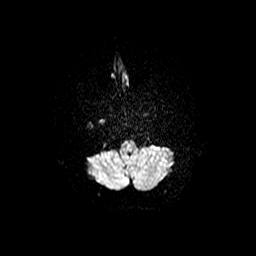
[im 33/98]
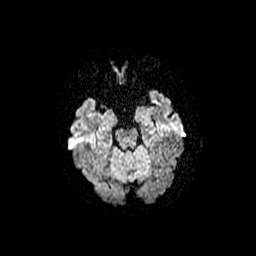
[im 49/98]
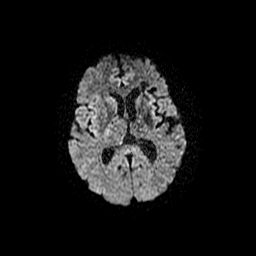
[im 65/98]
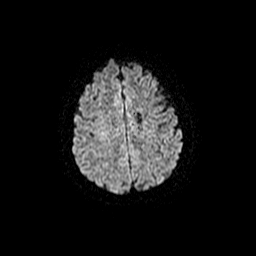
[im 81/98]
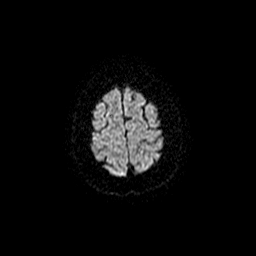
[im 98/98]
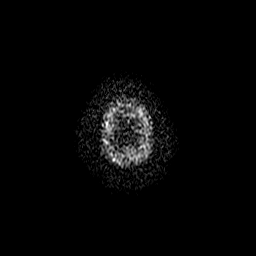

[Series 5: DWI · coronal · 5.0mm · 1.09mm/px · 5 of 68 slices shown (2 of 4)]
[im 1/68]
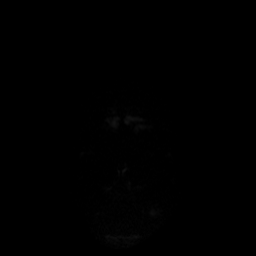
[im 17/68]
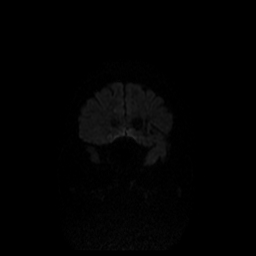
[im 34/68]
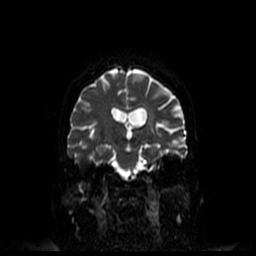
[im 51/68]
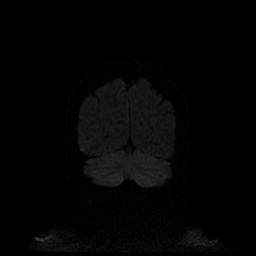
[im 68/68]
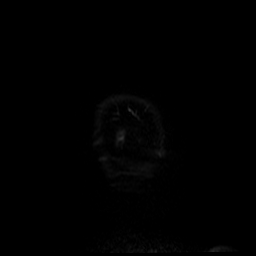

[Series 6: T1 · sagittal · 5.0mm · 0.47mm/px · 2 of 23 slices shown (1 of 2)]
[im 1/23]
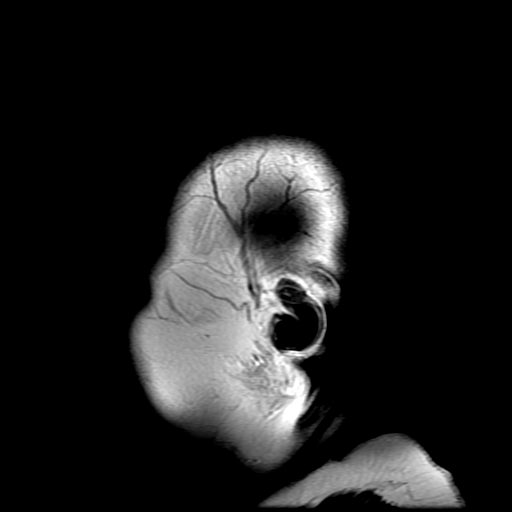
[im 23/23]
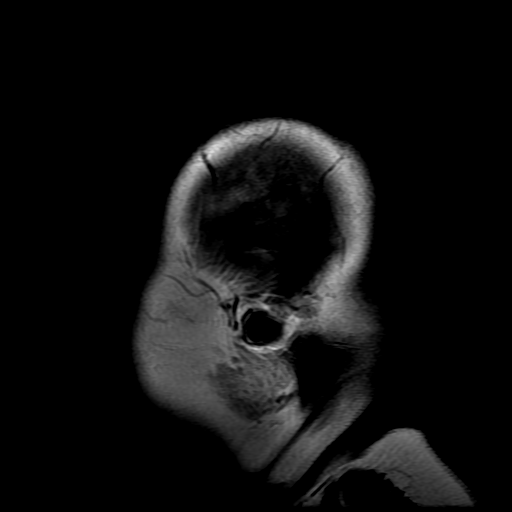

[Series 7: T2 · axial · 5.0mm · 0.43mm/px · z∈[-39,+98]mm · 2 of 24 slices shown]
[im 1/24]
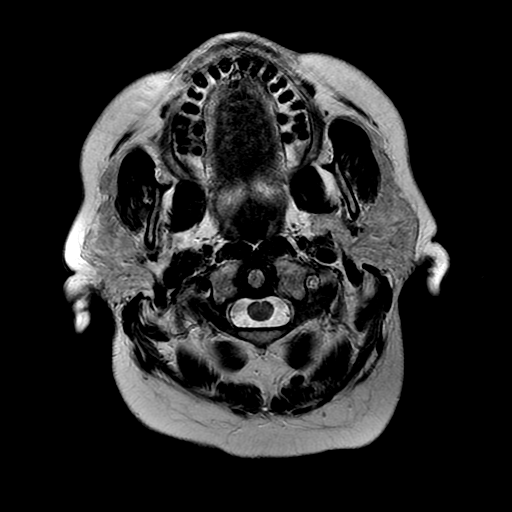
[im 24/24]
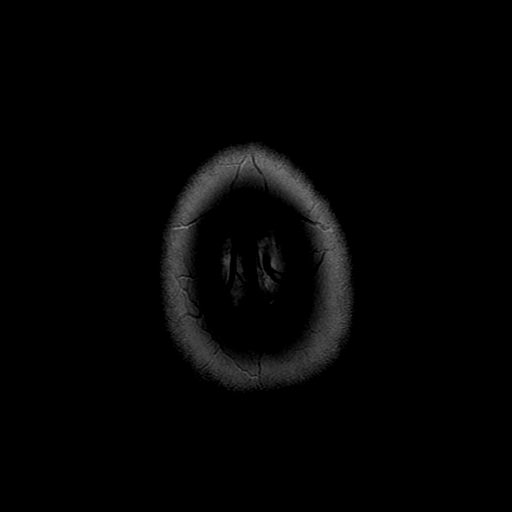

[Series 8: FLAIR · axial · 3.0mm · 0.43mm/px · z∈[-30,+101]mm · 2 of 23 slices shown]
[im 1/23]
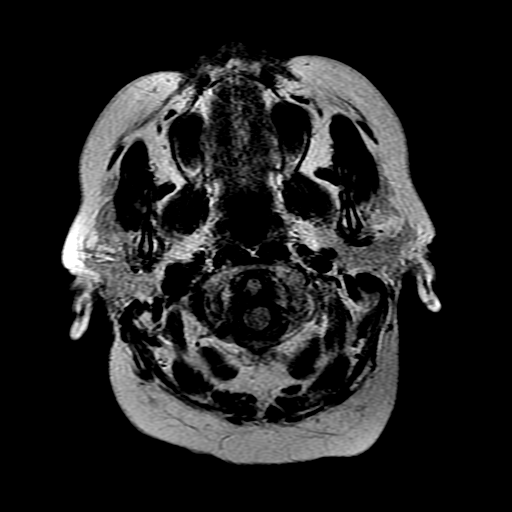
[im 23/23]
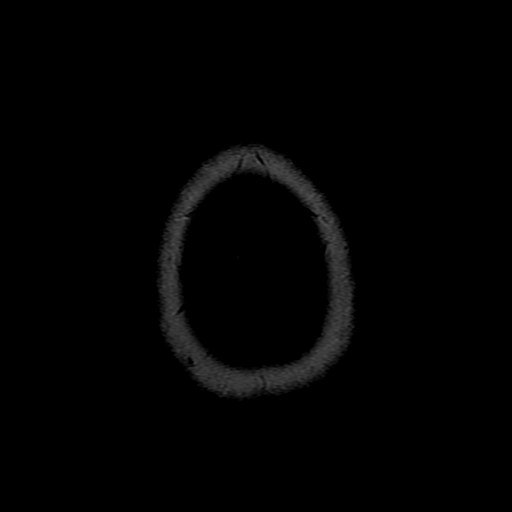

[Series 10: T1 · axial · 3.0mm · 0.47mm/px · z∈[-27,+36]mm · 4 of 100 slices shown (2 of 2)]
[im 1/100]
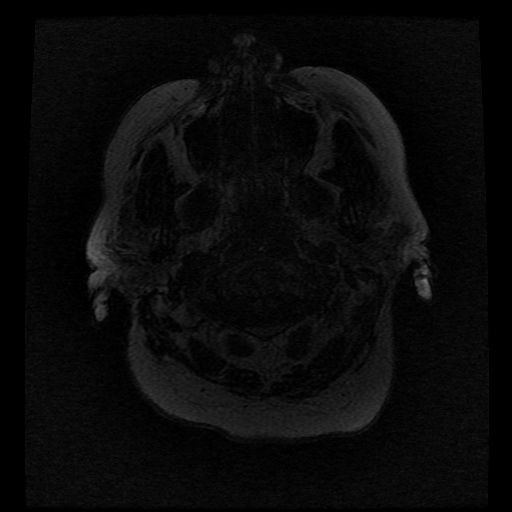
[im 15/100]
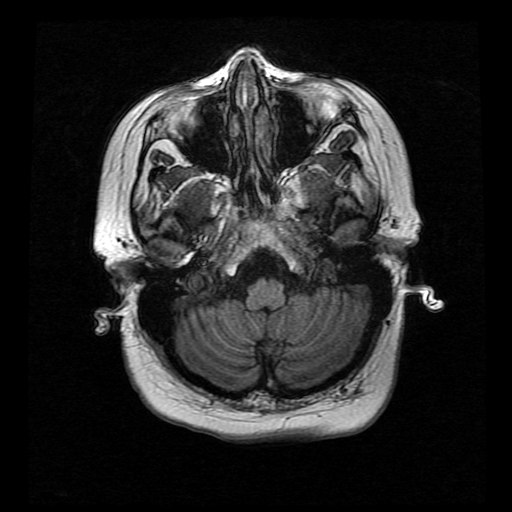
[im 29/100]
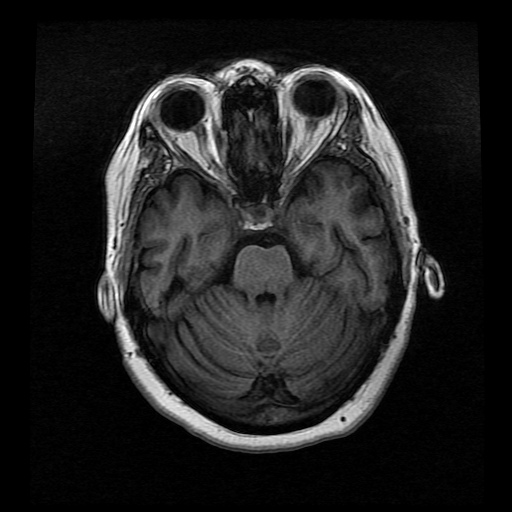
[im 43/100]
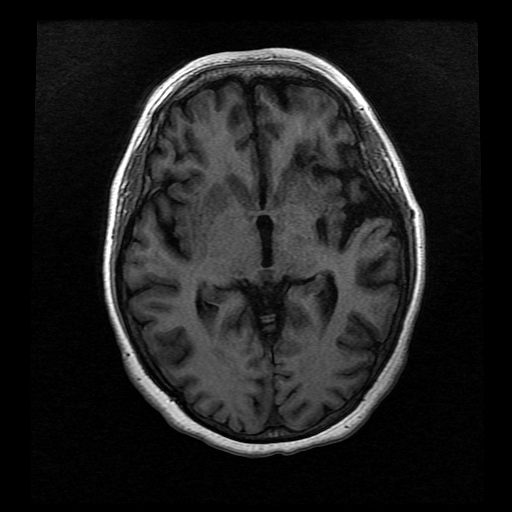

[Series 11: T2 post-contrast · coronal · 5.0mm · 0.39mm/px · 2 of 25 slices shown]
[im 1/25]
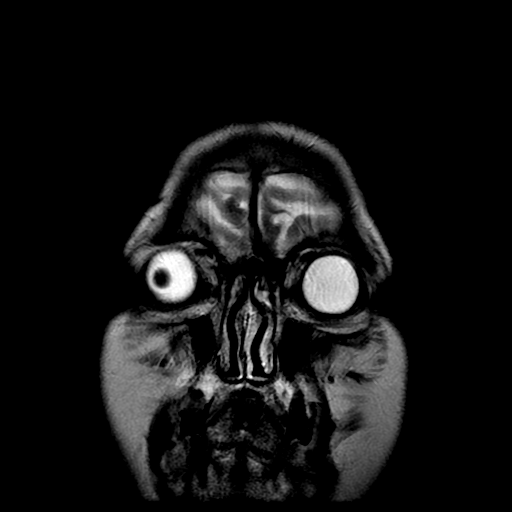
[im 25/25]
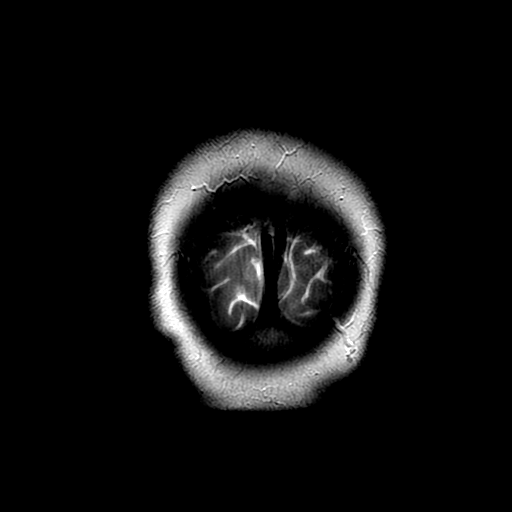

[Series 300: DWI · axial · 3.0mm · 1.09mm/px · z∈[-32,+111]mm · 4 of 49 slices shown (3 of 4)]
[im 1/49]
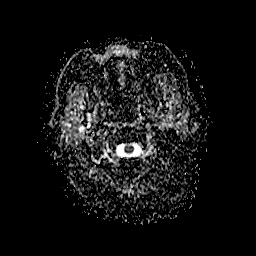
[im 17/49]
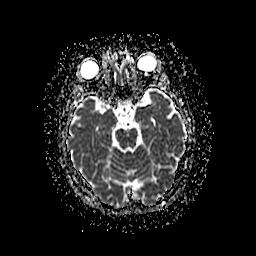
[im 33/49]
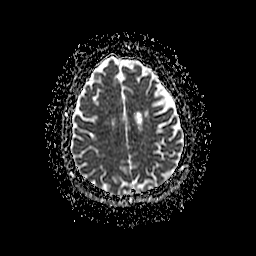
[im 49/49]
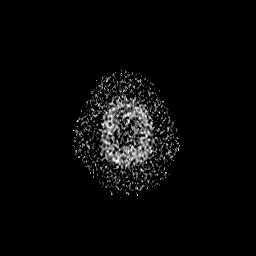

[Series 500: DWI · coronal · 5.0mm · 1.09mm/px · 3 of 34 slices shown (4 of 4)]
[im 1/34]
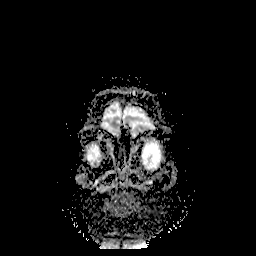
[im 17/34]
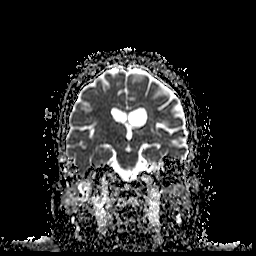
[im 34/34]
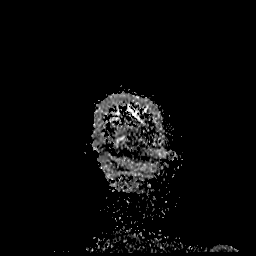

[31 of 48 positions shown; findings below may reference images not displayed]

FINDINGS: MRI HEAD FINDINGS

Brain: There is no evidence of acute intracranial hemorrhage,
extra-axial fluid collection, or acute infarct.

Remote infarcts in the left MCA distribution are unchanged, with
unchanged mild ex vacuo dilatation of the left lateral ventricle.
Layering degeneration is again seen in the posterior limb of the
left internal capsule extending into the left cerebral peduncle,
unchanged. Otherwise, parenchymal volume is normal. The ventricles
are otherwise normal in size. Additional small remote infarcts are
seen in the bilateral centrum semiovale.

There is no mass lesion.  There is no mass effect or midline shift.

Vascular: The major intracranial flow voids are present proximally.
There is susceptibility related to the left MCA stent.

Skull and upper cervical spine: Normal marrow signal.

Sinuses/Orbits: The paranasal sinuses are clear. The globes and
orbits are unremarkable.

Other: None.

MRA HEAD FINDINGS

Anterior circulation: The intracranial ICAs are patent

The patient is status post Y stenting of the left M1 to proximal M2
segments. Susceptibility limits evaluation of flow within the
stents. Flow related enhancement is seen within an inferior M2
branch in the sylvian fissure which is slightly decreased in
caliber, similar to the prior study. No flow related enhancement is
seen distal to the superior branch stent.

The right MCA and distal branches are patent.

The bilateral ACAs are patent. The 2 mm superiorly directed
outpouching arising from the anterior communicating artery is
unchanged.

Posterior circulation: Significantly diminished flow related
enhancement in the left V4 segment is unchanged. The right V4
segment is patent. The basilar artery is patent. The bilateral PCAs
are patent. Bilateral posterior communicating arteries are
identified.

There is no aneurysm or AVM.

Anatomic variants: None.
IMPRESSION: 1. Unchanged remote infarcts in the left MCA distribution. No new
acute intracranial pathology.
2. Status post Y stenting of the left M1 to proximal M2 segments.
Flow related enhancement is again seen within an inferior M2 branch
in the sylvian fissure, though diminished in caliber, with no flow
related enhancement distal to the superior branch stent, unchanged.
3. Stable 2 mm outpouching arising from the anterior communicating
artery.

## 2021-10-02 ENCOUNTER — Telehealth (HOSPITAL_COMMUNITY): Payer: Self-pay

## 2021-10-02 NOTE — Telephone Encounter (Signed)
Pt agreed to f/u in 9 months with an MRA. AW ?

## 2021-11-04 ENCOUNTER — Ambulatory Visit: Payer: Medicaid Other | Admitting: Nurse Practitioner

## 2021-11-04 ENCOUNTER — Ambulatory Visit: Payer: Medicaid Other

## 2021-11-05 ENCOUNTER — Ambulatory Visit (INDEPENDENT_AMBULATORY_CARE_PROVIDER_SITE_OTHER): Payer: Medicaid Other | Admitting: Nurse Practitioner

## 2021-11-05 ENCOUNTER — Ambulatory Visit: Payer: Medicaid Other | Admitting: Nurse Practitioner

## 2021-11-05 ENCOUNTER — Encounter: Payer: Self-pay | Admitting: Nurse Practitioner

## 2021-11-05 VITALS — BP 169/84 | HR 69 | Temp 97.4°F | Resp 16 | Ht 61.0 in | Wt 180.8 lb

## 2021-11-05 DIAGNOSIS — I6930 Unspecified sequelae of cerebral infarction: Secondary | ICD-10-CM | POA: Diagnosis not present

## 2021-11-05 DIAGNOSIS — I10 Essential (primary) hypertension: Secondary | ICD-10-CM

## 2021-11-05 DIAGNOSIS — I63512 Cerebral infarction due to unspecified occlusion or stenosis of left middle cerebral artery: Secondary | ICD-10-CM

## 2021-11-05 DIAGNOSIS — E782 Mixed hyperlipidemia: Secondary | ICD-10-CM | POA: Diagnosis not present

## 2021-11-05 NOTE — Progress Notes (Signed)
? ?Established Patient Office Visit ? ?Subjective:  ?Patient ID: Erin Peck, female    DOB: 01-23-1966  Age: 56 y.o. MRN: [id number] ? ?CC:  ?Chief Complaint  ?Patient presents with  ? Chronic Disease Management   ? ? ?HPI ?Erin Peck presents for Mixed hyperlipidemia  ?Pt presents with hyperlipidemia. Patient was diagnosed in 10/06/2019.  Compliance with treatment has been  good; The patient is compliant with medications, maintains a low cholesterol diet , follows up as directed , and maintains an exercise regimen . The patient denies experiencing any hypercholesterolemia related symptoms.   ? ?Concerning this elevated hypertension.  Patient is maintained due to history of stroke with permissive hypertension. ? ? ?Past Medical History:  ?Diagnosis Date  ? Hyperlipidemia   ? Migraines   ? 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 ? ?Past Surgical History:  ?Procedure Laterality Date  ? BUBBLE STUDY  10/06/2019  ? Procedure: BUBBLE STUDY;  Surgeon: Sueanne Margarita, MD;  Location: [place];  Service: Cardiovascular;;  ? IR CT HEAD LTD  10/03/2019  ? IR INTRA CRAN STENT  10/03/2019  ? IR PATIENT EVAL TECH 0-60 MINS  10/03/2019  ? IR PERCUTANEOUS ART THROMBECTOMY/INFUSION INTRACRANIAL INC DIAG ANGIO  10/03/2019  ? RADIOLOGY WITH ANESTHESIA N/A 10/02/2019  ? Procedure: IR WITH ANESTHESIA;  Surgeon: Luanne Bras, MD;  Location: [place];  Service: Radiology;  Laterality: N/A;  ? TEE WITHOUT CARDIOVERSION N/A 10/06/2019  ? Procedure: TRANSESOPHAGEAL ECHOCARDIOGRAM (TEE);  Surgeon: Sueanne Margarita, MD;  Location: [place];  Service: Cardiovascular;  Laterality: N/A;  ? ? ?Family History  ?Problem Relation Age of Onset  ? Heart disease Mother   ? Diabetes Mother   ? Heart disease Father   ? Diabetes Father   ? ? ?Social History  ? ?Socioeconomic History  ? Marital status: Married  ?  Spouse name: Not on file  ? Number of children: Not on file  ? Years of education: Not on file  ?  Highest education level: Not on file  ?Occupational History  ? Not on file  ?Tobacco Use  ? Smoking status: Former  ? Smokeless tobacco: Never  ?Substance and Sexual Activity  ? Alcohol use: Not Currently  ? Drug use: Not Currently  ? Sexual activity: Not Currently  ?Other Topics Concern  ? Not on file  ?Social History Narrative  ? Not on file  ? ?Social Determinants of Health  ? ?Financial Resource Strain: Not on file  ?Food Insecurity: Not on file  ?Transportation Needs: Not on file  ?Physical Activity: Not on file  ?Stress: Not on file  ?Social Connections: Not on file  ?Intimate Partner Violence: Not on file  ? ? ?Outpatient Medications Prior to Visit  ?Medication Sig Dispense Refill  ? acetaminophen (TYLENOL) 325 MG tablet Take 2 tablets (650 mg total) by mouth every 4 (four) hours as needed for mild pain (or temp > 37.5 C (99.5 F)).    ? atorvastatin (LIPITOR) 80 MG tablet TAKE 1 TABLET (80 MG TOTAL) BY MOUTH DAILY AT 6 PM. NEEDS PASS 90 tablet 1  ? BRILINTA 90 MG TABS tablet TAKE 1 TABLET BY MOUTH 2 TIMES DAILY. 180 tablet 1  ? midodrine (PROAMATINE) 10 MG tablet TAKE 1 TABLET BY MOUTH THREE TIMES A DAY 90 tablet 5  ? ?No facility-administered medications prior to visit.  ? ? ?No Known Allergies ? ?ROS ?Review of Systems  ?Constitutional: Negative.   ?HENT:  Negative.    ?Eyes: Negative.   ?Respiratory: Negative.    ?Cardiovascular: Negative.   ?Gastrointestinal: Negative.   ?Musculoskeletal: Negative.   ?Neurological:  Positive for weakness. Negative for dizziness, facial asymmetry, speech difficulty and headaches.  ?     Right-sided weakness due to history of stroke  ?Psychiatric/Behavioral: Negative.    ?All other systems reviewed and are negative. ? ?  ?Objective:  ?  ?Physical Exam ?Vitals and nursing note reviewed. Exam conducted with a chaperone present (Daughter).  ?Constitutional:   ?   Appearance: Normal appearance. She is obese.  ?HENT:  ?   Head: Normocephalic.  ?   Nose: Nose normal.  ?    Mouth/Throat:  ?   Mouth: Mucous membranes are moist.  ?Eyes:  ?   Pupils: Pupils are equal, round, and reactive to light.  ?Abdominal:  ?   General: Bowel sounds are normal.  ?Skin: ?   General: Skin is warm.  ?   Findings: No rash.  ?Neurological:  ?   General: No focal deficit present.  ?   Mental Status: She is alert and oriented to person, place, and time.  ?   Cranial Nerves: No dysarthria or facial asymmetry.  ?   Sensory: Sensation is intact.  ?   Motor: Weakness present.  ?Psychiatric:     ?   Mood and Affect: Mood normal.     ?   Behavior: Behavior normal.  ? ? ?BP (!) 169/84   Pulse 69   Temp (!) 97.4 ?F (36.3 ?C)   Resp 16   Ht $R'5\' 1"'nY$  (1.549 m)   Wt 180 lb 12.8 oz (82 kg)   SpO2 100%   BMI 34.16 kg/m?  ?Wt Readings from Last 3 Encounters:  ?11/05/21 180 lb 12.8 oz (82 kg)  ?05/06/21 173 lb (78.5 kg)  ?01/31/21 165 lb (74.8 kg)  ? ? ? ?Health Maintenance Due  ?Topic Date Due  ? PAP SMEAR-Modifier  Never done  ? ? ?There are no preventive care reminders to display for this patient. ? ?Lab Results  ?Component Value Date  ? TSH 1.946 10/04/2019  ? ?Lab Results  ?Component Value Date  ? WBC 9.2 05/06/2021  ? HGB 14.0 05/06/2021  ? HCT 42.6 05/06/2021  ? MCV 91 05/06/2021  ? PLT 348 05/06/2021  ? ?Lab Results  ?Component Value Date  ? NA 141 05/06/2021  ? K 5.1 05/06/2021  ? CO2 25 05/06/2021  ? GLUCOSE 72 05/06/2021  ? BUN 13 05/06/2021  ? CREATININE 0.97 05/06/2021  ? BILITOT 0.5 05/06/2021  ? ALKPHOS 112 05/06/2021  ? AST 26 05/06/2021  ? ALT 36 (H) 05/06/2021  ? PROT 7.8 05/06/2021  ? ALBUMIN 5.0 (H) 05/06/2021  ? CALCIUM 10.6 (H) 05/06/2021  ? ANIONGAP 10 10/12/2019  ? EGFR 70 05/06/2021  ? ?Lab Results  ?Component Value Date  ? CHOL 161 05/06/2021  ? ?Lab Results  ?Component Value Date  ? HDL 54 05/06/2021  ? ?Lab Results  ?Component Value Date  ? [place] 83 05/06/2021  ? ?Lab Results  ?Component Value Date  ? TRIG 137 05/06/2021  ? ?Lab Results  ?Component Value Date  ? CHOLHDL 3.0 05/06/2021   ? ?Lab Results  ?Component Value Date  ? HGBA1C 5.3 10/03/2019  ? ? ?  ?Assessment & Plan:  ?Recent MRI-shows stable unchanged remote infarcts in the left MCA distribution.  No new acute intracranial pathology ? ?Completed lipid panels results pending. ? ?Follow-up in 6  months. ?Problem List Items Addressed This Visit   ? ?  ? Cardiovascular and Mediastinum  ? 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  No changes and no new signs and symptoms.  Patient is regaining strength. ?  ?  ? Accelerated hypertension  ?  Elevated blood pressure due to requirements after stroke.  Patient and family will continue to monitor and knows to call for new or worsening signs and symptoms of hypo-/hypertension. ?  ?  ?  ? Other  ? Hyperlipidemia - Primary  ?  Completed lipid panel with results pending.  Continue on current dose of atorvastatin.  Follow-up in 6 months. ?  ?  ? Relevant Orders  ? Lipid Panel  ? CBC with Differential  ? Comprehensive metabolic panel  ? ? ?No orders of the defined types were placed in this encounter. ? ? ?Follow-up: Return in about 6 months (around 05/07/2022) for chronic disease management.  ? ? ?Ivy Lynn, NP ?

## 2021-11-05 NOTE — Assessment & Plan Note (Signed)
No changes and no new signs and symptoms.  Patient is regaining strength. ?

## 2021-11-05 NOTE — Assessment & Plan Note (Signed)
Completed lipid panel with results pending.  Continue on current dose of atorvastatin.  Follow-up in 6 months. ?

## 2021-11-05 NOTE — Assessment & Plan Note (Signed)
Elevated blood pressure due to requirements after stroke.  Patient and family will continue to monitor and knows to call for new or worsening signs and symptoms of hypo-/hypertension. ?

## 2021-11-05 NOTE — Patient Instructions (Signed)

## 2021-11-06 LAB — COMPREHENSIVE METABOLIC PANEL
ALT: 32 IU/L (ref 0–32)
AST: 27 IU/L (ref 0–40)
Albumin/Globulin Ratio: 1.8 (ref 1.2–2.2)
Albumin: 4.8 g/dL (ref 3.8–4.9)
Alkaline Phosphatase: 143 IU/L — ABNORMAL HIGH (ref 44–121)
BUN/Creatinine Ratio: 12 (ref 9–23)
BUN: 11 mg/dL (ref 6–24)
Bilirubin Total: 0.6 mg/dL (ref 0.0–1.2)
CO2: 24 mmol/L (ref 20–29)
Calcium: 9.8 mg/dL (ref 8.7–10.2)
Chloride: 104 mmol/L (ref 96–106)
Creatinine, Ser: 0.94 mg/dL (ref 0.57–1.00)
Globulin, Total: 2.7 g/dL (ref 1.5–4.5)
Glucose: 87 mg/dL (ref 70–99)
Potassium: 4.5 mmol/L (ref 3.5–5.2)
Sodium: 142 mmol/L (ref 134–144)
Total Protein: 7.5 g/dL (ref 6.0–8.5)
eGFR: 73 mL/min/{1.73_m2} (ref >59)

## 2021-11-06 LAB — LIPID PANEL
Chol/HDL Ratio: 3 ratio (ref 0.0–4.4)
Cholesterol, Total: 146 mg/dL (ref 100–199)
HDL: 48 mg/dL (ref >39)
LDL Chol Calc (NIH): 81 mg/dL (ref 0–99)
Triglycerides: 91 mg/dL (ref 0–149)
VLDL Cholesterol Cal: 17 mg/dL (ref 5–40)

## 2021-11-06 LAB — CBC WITH DIFFERENTIAL/PLATELET
Basophils Absolute: 0.1 10*3/uL (ref 0.0–0.2)
Basos: 1 %
EOS (ABSOLUTE): 0.2 10*3/uL (ref 0.0–0.4)
Eos: 3 %
Hematocrit: 41.2 % (ref 34.0–46.6)
Hemoglobin: 13.3 g/dL (ref 11.1–15.9)
Immature Grans (Abs): 0 10*3/uL (ref 0.0–0.1)
Immature Granulocytes: 0 %
Lymphocytes Absolute: 1.9 10*3/uL (ref 0.7–3.1)
Lymphs: 25 %
MCH: 29.4 pg (ref 26.6–33.0)
MCHC: 32.3 g/dL (ref 31.5–35.7)
MCV: 91 fL (ref 79–97)
Monocytes Absolute: 0.6 10*3/uL (ref 0.1–0.9)
Monocytes: 8 %
Neutrophils Absolute: 4.6 10*3/uL (ref 1.4–7.0)
Neutrophils: 63 %
Platelets: 343 10*3/uL (ref 150–450)
RBC: 4.52 x10E6/uL (ref 3.77–5.28)
RDW: 12.6 % (ref 11.7–15.4)
WBC: 7.4 10*3/uL (ref 3.4–10.8)

## 2021-11-09 ENCOUNTER — Other Ambulatory Visit: Payer: Self-pay | Admitting: Nurse Practitioner

## 2021-12-02 ENCOUNTER — Other Ambulatory Visit: Payer: Self-pay | Admitting: Nurse Practitioner

## 2021-12-02 DIAGNOSIS — I63512 Cerebral infarction due to unspecified occlusion or stenosis of left middle cerebral artery: Secondary | ICD-10-CM

## 2022-04-09 ENCOUNTER — Other Ambulatory Visit: Payer: Self-pay | Admitting: Nurse Practitioner

## 2022-04-09 DIAGNOSIS — I63512 Cerebral infarction due to unspecified occlusion or stenosis of left middle cerebral artery: Secondary | ICD-10-CM

## 2022-04-28 ENCOUNTER — Encounter: Payer: Self-pay | Admitting: *Deleted

## 2022-05-03 ENCOUNTER — Other Ambulatory Visit: Payer: Self-pay | Admitting: Nurse Practitioner

## 2022-05-08 ENCOUNTER — Ambulatory Visit: Payer: Medicaid Other | Admitting: Nurse Practitioner

## 2022-05-29 ENCOUNTER — Ambulatory Visit: Payer: Medicaid Other | Admitting: Nurse Practitioner

## 2022-06-02 ENCOUNTER — Ambulatory Visit: Payer: Medicaid Other | Admitting: Nurse Practitioner

## 2022-06-05 ENCOUNTER — Ambulatory Visit (INDEPENDENT_AMBULATORY_CARE_PROVIDER_SITE_OTHER): Payer: Medicare Other | Admitting: Nurse Practitioner

## 2022-06-05 ENCOUNTER — Encounter: Payer: Self-pay | Admitting: Nurse Practitioner

## 2022-06-05 VITALS — BP 167/84 | HR 71 | Temp 98.6°F | Ht 61.0 in | Wt 180.4 lb

## 2022-06-05 DIAGNOSIS — I959 Hypotension, unspecified: Secondary | ICD-10-CM | POA: Diagnosis not present

## 2022-06-05 DIAGNOSIS — E782 Mixed hyperlipidemia: Secondary | ICD-10-CM | POA: Diagnosis not present

## 2022-06-05 DIAGNOSIS — I10 Essential (primary) hypertension: Secondary | ICD-10-CM

## 2022-06-05 NOTE — Progress Notes (Signed)
Established Patient Office Visit  Subjective   Patient ID: Erin Peck, female    DOB: Aug 06, 1965  Age: 56 y.o. MRN: [id number]  Chief Complaint  Patient presents with   Medical Management of Chronic Issues    2month   Hypertension This is a chronic problem. The current episode started more than 1 year ago. The problem is unchanged (permissive hypertension due to stroke). The problem is uncontrolled. Pertinent negatives include no chest pain, headaches, malaise/fatigue, peripheral edema or shortness of breath. Risk factors for coronary artery disease include obesity and dyslipidemia. Past treatments include nothing. There are no compliance problems.   Hyperlipidemia This is a chronic problem. The current episode started more than 1 year ago. The problem is controlled. Recent lipid tests were reviewed and are low. Exacerbating diseases include obesity. She has no history of liver disease. Pertinent negatives include no chest pain or shortness of breath. The current treatment provides no improvement of lipids.    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 ([facility] 10/03/2019   Acute ischemic stroke ([facility] 10/02/2019   Past Medical History:  Diagnosis Date   Hyperlipidemia    Migraines    Stroke (cerebrum) ([facility] 10/02/2019   Stroke (cerebrum) ([facility] 10/09/2019   Past Surgical History:  Procedure Laterality Date   BUBBLE STUDY   10/06/2019   Procedure: BUBBLE STUDY;  Surgeon: TSueanne Margarita MD;  [facility]  Service: Cardiovascular;;   IR CT HEAD LTD  10/03/2019   IR INTRA CRAN STENT  10/03/2019   IR PATIENT EVAL TECH 0-60 MINS  10/03/2019   IR PERCUTANEOUS ART THROMBECTOMY/INFUSION INTRACRANIAL INC DIAG ANGIO  10/03/2019   RADIOLOGY WITH ANESTHESIA N/A 10/02/2019   Procedure: IR WITH ANESTHESIA;  Surgeon: DLuanne Bras MD;  [facility]  Service: Radiology;  Laterality: N/A;   TEE WITHOUT CARDIOVERSION N/A 10/06/2019   Procedure: TRANSESOPHAGEAL ECHOCARDIOGRAM (TEE);  Surgeon: TSueanne Margarita MD;  [facility];  Service: Cardiovascular;  Laterality: N/A;   Social History   Tobacco Use   Smoking status: Former   Smokeless tobacco: Never  Substance Use Topics   Alcohol use: Not Currently   Drug use: Not Currently   Social History   Socioeconomic History   Marital status: Married    Spouse name: Not on file   Number of children: Not on file   Years of education: Not on file   Highest education level: Not on file  Occupational History   Not on file  Tobacco Use   Smoking status: Former   Smokeless tobacco: Never  Substance and Sexual Activity   Alcohol use: Not Currently   Drug use: Not Currently   Sexual activity: Not Currently  Other Topics Concern   Not on file  Social History Narrative   Not on file   Social Determinants of Health   Financial Resource Strain: Not on file  Food Insecurity: Not on file  Transportation Needs: Not on file  Physical Activity: Not  on file  Stress: Not on file  Social Connections: Not on file  Intimate Partner Violence: Not on file   Family Status  Relation Name Status   Mother  Deceased   Father  Deceased   Family History  Problem Relation Age of Onset   Heart disease Mother    Diabetes Mother    Heart disease Father    Diabetes Father    No Known Allergies    Review of Systems  Constitutional: Negative.  Negative for  malaise/fatigue.  Eyes: Negative.   Respiratory: Negative.  Negative for shortness of breath.   Cardiovascular: Negative.  Negative for chest pain.  Gastrointestinal: Negative.   Genitourinary: Negative.   Musculoskeletal: Negative.  Negative for back pain.  Skin: Negative.  Negative for itching and rash.  Neurological: Negative.  Negative for headaches.  All other systems reviewed and are negative.     Objective:     BP (!) 167/84   Pulse 71   Temp 98.6 F (37 C)   Ht _0  (1.549 m)   Wt 180 lb 6.4 oz (81.8 kg)   SpO2 98%   BMI 34.09 kg/m  BP Readings from Last 3 Encounters:  06/05/22 (!) 167/84  11/05/21 (!) 169/84  05/06/21 (!) 164/79   Wt Readings from Last 3 Encounters:  06/05/22 180 lb 6.4 oz (81.8 kg)  11/05/21 180 lb 12.8 oz (82 kg)  05/06/21 173 lb (78.5 kg)      Physical Exam Vitals and nursing note reviewed.  Constitutional:      Appearance: Normal appearance.  HENT:     Head: Normocephalic.     Right Ear: External ear normal.     Left Ear: External ear normal.     Nose: Nose normal.     Mouth/Throat:     Mouth: Mucous membranes are moist.     Pharynx: Oropharynx is clear.  Eyes:     Conjunctiva/sclera: Conjunctivae normal.  Cardiovascular:     Rate and Rhythm: Normal rate and regular rhythm.     Pulses: Normal pulses.     Heart sounds: Normal heart sounds.  Pulmonary:     Effort: Pulmonary effort is normal.     Breath sounds: No stridor.  Abdominal:     General: Bowel sounds are normal.  Skin:    General: Skin is warm.     Findings: No erythema or rash.  Neurological:     General: No focal deficit present.     Mental Status: She is alert and oriented to person, place, and time.  Psychiatric:        Mood and Affect: Mood normal.        Behavior: Behavior normal.      No results found for any visits on 06/05/22.  Last CBC Lab Results  Component Value Date   WBC 7.4 11/05/2021   HGB 13.3 11/05/2021   HCT 41.2 11/05/2021   MCV  91 11/05/2021   MCH 29.4 11/05/2021   RDW 12.6 11/05/2021   PLT 343 67/61/9509   Last metabolic panel Lab Results  Component Value Date   GLUCOSE 87 11/05/2021   NA 142 11/05/2021   K 4.5 11/05/2021   CL 104 11/05/2021   CO2 24 11/05/2021   BUN 11 11/05/2021   CREATININE 0.94 11/05/2021   EGFR 73 11/05/2021   CALCIUM 9.8 11/05/2021   PROT 7.5 11/05/2021   ALBUMIN 4.8 11/05/2021   LABGLOB 2.7 11/05/2021   AGRATIO 1.8 11/05/2021   BILITOT  0.6 11/05/2021   ALKPHOS 143 (H) 11/05/2021   AST 27 11/05/2021   ALT 32 11/05/2021   ANIONGAP 10 10/12/2019   Last lipids Lab Results  Component Value Date   CHOL 146 11/05/2021   HDL 48 11/05/2021   LDLCALC 81 11/05/2021   TRIG 91 11/05/2021   CHOLHDL 3.0 11/05/2021   Last hemoglobin A1c Lab Results  Component Value Date   HGBA1C 5.3 10/03/2019   Last thyroid functions Lab Results  Component Value Date   TSH 1.946 10/04/2019      The ASCVD Risk score (Arnett DK, et al., 2019) failed to calculate for the following reasons:   The patient has a prior MI or stroke diagnosis    Assessment & Plan:   Problem List Items Addressed This Visit       Cardiovascular and Mediastinum   Hypotension    Managed with Midodrine. No changes needed.      Accelerated hypertension    Patient is currently not treated for hypertension. Blood pressure elevation is needed to maintain permissive hypertension due to patiens hx of stroke and two stent placement.         Other   Hyperlipidemia - Primary    Labs completed results pending, no new or worsening signs of hyperlipidemia      Relevant Orders   CBC with Differential/Platelet   CMP14+EGFR   HCV Ab w Reflex to Quant PCR   Lipid panel   TSH    Return in about 6 months (around 12/04/2022) for chronic disease management.    Ivy Lynn, NP

## 2022-06-05 NOTE — Assessment & Plan Note (Signed)
Labs completed results pending, no new or worsening signs of hyperlipidemia

## 2022-06-05 NOTE — Assessment & Plan Note (Signed)
Managed with Midodrine. No changes needed.

## 2022-06-05 NOTE — Assessment & Plan Note (Signed)
Patient is currently not treated for hypertension. Blood pressure elevation is needed to maintain permissive hypertension due to patiens hx of stroke and two stent placement.

## 2022-06-05 NOTE — Patient Instructions (Signed)
Dyslipidemia Dyslipidemia is an imbalance of waxy, fat-like substances (lipids) in the blood. The body needs lipids in small amounts. Dyslipidemia often involves a high level of cholesterol or triglycerides, which are types of lipids. Common forms of dyslipidemia include: High levels of LDL cholesterol. LDL is the type of cholesterol that causes fatty deposits (plaques) to build up in the blood vessels that carry blood away from the heart (arteries). Low levels of HDL cholesterol. HDL cholesterol is the type of cholesterol that protects against heart disease. High levels of HDL remove the LDL buildup from arteries. High levels of triglycerides. Triglycerides are a fatty substance in the blood that is linked to a buildup of plaques in the arteries. What are the causes? There are two main types of dyslipidemia: primary and secondary. Primary dyslipidemia is caused by changes (mutations) in genes that are passed down through families (inherited). These mutations cause several types of dyslipidemia. Secondary dyslipidemia may be caused by various risk factors that can lead to the disease, such as lifestyle choices and certain medical conditions. What increases the risk? You are more likely to develop this condition if you are an older man or if you are a woman who has gone through menopause. Other risk factors include: Having a family history of dyslipidemia. Taking certain medicines, including birth control pills, steroids, some diuretics, and beta-blockers. Eating a diet high in saturated fat. Smoking cigarettes or excessive alcohol intake. Having certain medical conditions such as diabetes, polycystic ovary syndrome (PCOS), kidney disease, liver disease, or hypothyroidism. Not exercising regularly. Being overweight or obese with too much belly fat. What are the signs or symptoms? In most cases, dyslipidemia does not usually cause any symptoms. In severe cases, very high lipid levels can  cause: Fatty bumps under the skin (xanthomas). A white or gray ring around the black center (pupil) of the eye. Very high triglyceride levels can cause inflammation of the pancreas (pancreatitis). How is this diagnosed? Your health care provider may diagnose dyslipidemia based on a routine blood test (fasting blood test). Because most people do not have symptoms of the condition, this blood testing (lipid profile) is done on adults age 20 and older and is repeated every 4-6 years. This test checks: Total cholesterol. This measures the total amount of cholesterol in your blood, including LDL cholesterol, HDL cholesterol, and triglycerides. A healthy number is below 200 mg/dL (5.17 mmol/L). LDL cholesterol. The target number for LDL cholesterol is different for each person, depending on individual risk factors. A healthy number is usually below 100 mg/dL (2.59 mmol/L). Ask your health care provider what your LDL cholesterol should be. HDL cholesterol. An HDL level of 60 mg/dL (1.55 mmol/L) or higher is best because it helps to protect against heart disease. A number below 40 mg/dL (1.03 mmol/L) for men or below 50 mg/dL (1.29 mmol/L) for women increases the risk for heart disease. Triglycerides. A healthy triglyceride number is below 150 mg/dL (1.69 mmol/L). If your lipid profile is abnormal, your health care provider may do other blood tests. How is this treated? Treatment depends on the type of dyslipidemia that you have and your other risk factors for heart disease and stroke. Your health care provider will have a target range for your lipid levels based on this information. Treatment for dyslipidemia starts with lifestyle changes, such as diet and exercise. Your health care provider may recommend that you: Get regular exercise. Make changes to your diet. Quit smoking if you smoke. Limit your alcohol intake. If diet   changes and exercise do not help you reach your goals, your health care provider  may also prescribe medicine to lower lipids. The most commonly prescribed type of medicine lowers your LDL cholesterol (statin drug). If you have a high triglyceride level, your provider may prescribe another type of drug (fibrate) or an omega-3 fish oil supplement, or both. Follow these instructions at home: Eating and drinking  Follow instructions from your health care provider or dietitian about eating or drinking restrictions. Eat a healthy diet as told by your health care provider. This can help you reach and maintain a healthy weight, lower your LDL cholesterol, and raise your HDL cholesterol. This may include: Limiting your calories, if you are overweight. Eating more fruits, vegetables, whole grains, fish, and lean meats. Limiting saturated fat, trans fat, and cholesterol. Do not drink alcohol if: Your health care provider tells you not to drink. You are pregnant, may be pregnant, or are planning to become pregnant. If you drink alcohol: Limit how much you have to: 0-1 drink a day for women. 0-2 drinks a day for men. Know how much alcohol is in your drink. In the [place] one drink equals one 12 oz bottle of beer (355 mL), one 5 oz glass of wine (148 mL), or one 1 oz glass of hard liquor (44 mL). Activity Get regular exercise. Start an exercise and strength training program as told by your health care provider. Ask your health care provider what activities are safe for you. Your health care provider may recommend: 30 minutes of aerobic activity 4-6 days a week. Brisk walking is an example of aerobic activity. Strength training 2 days a week. General instructions Do not use any products that contain nicotine or tobacco. These products include cigarettes, chewing tobacco, and vaping devices, such as e-cigarettes. If you need help quitting, ask your health care provider. Take over-the-counter and prescription medicines only as told by your health care provider. This includes  supplements. Keep all follow-up visits. This is important. Contact a health care provider if: You are having trouble sticking to your exercise or diet plan. You are struggling to quit smoking or to control your use of alcohol. Summary Dyslipidemia often involves a high level of cholesterol or triglycerides, which are types of lipids. Treatment depends on the type of dyslipidemia that you have and your other risk factors for heart disease and stroke. Treatment for dyslipidemia starts with lifestyle changes, such as diet and exercise. Your health care provider may prescribe medicine to lower lipids. This information is not intended to replace advice given to you by your health care provider. Make sure you discuss any questions you have with your health care provider. Document Revised: 02/13/2022 Document Reviewed: 09/16/2020 Elsevier Patient Education  2023 Else[employer] Inc.  

## 2022-06-06 LAB — CBC WITH DIFFERENTIAL/PLATELET
Basophils Absolute: 0 10*3/uL (ref 0.0–0.2)
Basos: 1 %
EOS (ABSOLUTE): 0.1 10*3/uL (ref 0.0–0.4)
Eos: 2 %
Hematocrit: 39.8 % (ref 34.0–46.6)
Hemoglobin: 13 g/dL (ref 11.1–15.9)
Immature Grans (Abs): 0 10*3/uL (ref 0.0–0.1)
Immature Granulocytes: 0 %
Lymphocytes Absolute: 1.7 10*3/uL (ref 0.7–3.1)
Lymphs: 26 %
MCH: 29.2 pg (ref 26.6–33.0)
MCHC: 32.7 g/dL (ref 31.5–35.7)
MCV: 89 fL (ref 79–97)
Monocytes Absolute: 0.4 10*3/uL (ref 0.1–0.9)
Monocytes: 6 %
Neutrophils Absolute: 4.4 10*3/uL (ref 1.4–7.0)
Neutrophils: 65 %
Platelets: 348 10*3/uL (ref 150–450)
RBC: 4.45 x10E6/uL (ref 3.77–5.28)
RDW: 12.5 % (ref 11.7–15.4)
WBC: 6.7 10*3/uL (ref 3.4–10.8)

## 2022-06-06 LAB — LIPID PANEL
Chol/HDL Ratio: 3 ratio (ref 0.0–4.4)
Cholesterol, Total: 143 mg/dL (ref 100–199)
HDL: 48 mg/dL (ref >39)
LDL Chol Calc (NIH): 76 mg/dL (ref 0–99)
Triglycerides: 104 mg/dL (ref 0–149)
VLDL Cholesterol Cal: 19 mg/dL (ref 5–40)

## 2022-06-06 LAB — CMP14+EGFR
ALT: 32 IU/L (ref 0–32)
AST: 25 IU/L (ref 0–40)
Albumin/Globulin Ratio: 1.9 (ref 1.2–2.2)
Albumin: 4.9 g/dL (ref 3.8–4.9)
Alkaline Phosphatase: 98 IU/L (ref 44–121)
BUN/Creatinine Ratio: 15 (ref 9–23)
BUN: 13 mg/dL (ref 6–24)
Bilirubin Total: 0.7 mg/dL (ref 0.0–1.2)
CO2: 21 mmol/L (ref 20–29)
Calcium: 9.7 mg/dL (ref 8.7–10.2)
Chloride: 103 mmol/L (ref 96–106)
Creatinine, Ser: 0.87 mg/dL (ref 0.57–1.00)
Globulin, Total: 2.6 g/dL (ref 1.5–4.5)
Glucose: 108 mg/dL — ABNORMAL HIGH (ref 70–99)
Potassium: 4.3 mmol/L (ref 3.5–5.2)
Sodium: 140 mmol/L (ref 134–144)
Total Protein: 7.5 g/dL (ref 6.0–8.5)
eGFR: 80 mL/min/{1.73_m2} (ref >59)

## 2022-06-06 LAB — TSH: TSH: 3.56 u[IU]/mL (ref 0.450–4.500)

## 2022-06-06 LAB — HCV INTERPRETATION

## 2022-06-06 LAB — HCV AB W REFLEX TO QUANT PCR: HCV Ab: NONREACTIVE

## 2022-08-04 ENCOUNTER — Other Ambulatory Visit (HOSPITAL_COMMUNITY): Payer: Self-pay | Admitting: Interventional Radiology

## 2022-08-04 DIAGNOSIS — I639 Cerebral infarction, unspecified: Secondary | ICD-10-CM

## 2022-08-24 ENCOUNTER — Ambulatory Visit (HOSPITAL_COMMUNITY): Payer: Medicare Other

## 2022-09-03 ENCOUNTER — Ambulatory Visit (HOSPITAL_COMMUNITY)
Admission: RE | Admit: 2022-09-03 | Discharge: 2022-09-03 | Disposition: A | Discharge status: Home / Self Care | Payer: Medicare Other | Source: Ambulatory Visit | Attending: Interventional Radiology | Admitting: Interventional Radiology

## 2022-09-03 DIAGNOSIS — I639 Cerebral infarction, unspecified: Secondary | ICD-10-CM | POA: Diagnosis not present

## 2022-09-07 ENCOUNTER — Telehealth (HOSPITAL_COMMUNITY): Payer: Self-pay

## 2022-09-07 NOTE — Telephone Encounter (Signed)
Pt agreed to f/u in 1 year with an mra head wo. Daughter wants to know if she has to continue Brilinta. I have sent a message to our PA to discuss. AB

## 2022-09-08 ENCOUNTER — Telehealth (HOSPITAL_COMMUNITY): Payer: Self-pay

## 2022-09-08 NOTE — Telephone Encounter (Signed)
-----   Message from Tyson Alias, NP sent at 09/07/2022  4:37 PM EST ----- Regarding: FW: Brilinta Yes. Dev says to continue taking Brilinta.   ----- Message ----- From: Chad Cordial Sent: 09/07/2022   3:55 PM EST To: Tyson Alias, NP Subject: Raye Sorrow,   Makalynn agreed to f/u in 1 year with an mra. She wants to know if she has to continue taking the Brilinta?  Thanks,  Lia Foyer

## 2022-09-10 ENCOUNTER — Telehealth: Payer: Self-pay | Admitting: Family Medicine

## 2022-09-10 MED ORDER — TICAGRELOR 90 MG PO TABS: 90.0000 mg | ORAL_TABLET | Freq: Two times a day (BID) | ORAL | 0 refills | Status: DC

## 2022-09-10 NOTE — Telephone Encounter (Signed)
Pt aware refill sen to pharmacy

## 2022-09-10 NOTE — Telephone Encounter (Signed)
  Prescription Request  09/10/2022  Is this a "Controlled Substance" medicine? BRILINTA 90 MG TABS tablet   Have you seen your PCP in the last 2 weeks? Apt scheduled for 5/10 with [facility]  If YES, route message to pool  -  If NO, patient needs to be scheduled for appointment.  What is the name of the medication or equipment? BRILINTA 90 MG TABS tablet   Have you contacted your pharmacy to request a refill? yes   Which pharmacy would you like this sent to? [facility] in [place]   Patient notified that their request is being sent to the clinical staff for review and that they should receive a response within 2 business days.

## 2022-09-22 ENCOUNTER — Other Ambulatory Visit: Payer: Self-pay | Admitting: *Deleted

## 2022-09-22 DIAGNOSIS — I63512 Cerebral infarction due to unspecified occlusion or stenosis of left middle cerebral artery: Secondary | ICD-10-CM

## 2022-09-22 MED ORDER — ATORVASTATIN CALCIUM 80 MG PO TABS: 80.0000 mg | ORAL_TABLET | Freq: Every day | ORAL | 0 refills | Status: DC

## 2022-12-04 ENCOUNTER — Ambulatory Visit: Payer: Medicare Other | Admitting: Nurse Practitioner

## 2022-12-04 ENCOUNTER — Encounter: Payer: Self-pay | Admitting: Family Medicine

## 2022-12-04 ENCOUNTER — Ambulatory Visit (INDEPENDENT_AMBULATORY_CARE_PROVIDER_SITE_OTHER): Payer: Medicare (Managed Care) | Admitting: Family Medicine

## 2022-12-04 VITALS — BP 159/80 | HR 71 | Temp 97.9°F | Ht 61.0 in | Wt 187.0 lb

## 2022-12-04 DIAGNOSIS — E782 Mixed hyperlipidemia: Secondary | ICD-10-CM | POA: Diagnosis not present

## 2022-12-04 DIAGNOSIS — I10 Essential (primary) hypertension: Secondary | ICD-10-CM

## 2022-12-04 DIAGNOSIS — I6930 Unspecified sequelae of cerebral infarction: Secondary | ICD-10-CM

## 2022-12-04 MED ORDER — ATORVASTATIN CALCIUM 80 MG PO TABS
80.0000 mg | ORAL_TABLET | Freq: Every day | ORAL | 0 refills | Status: AC
Start: 2022-12-04 — End: ?

## 2022-12-04 MED ORDER — TICAGRELOR 90 MG PO TABS
90.0000 mg | ORAL_TABLET | Freq: Two times a day (BID) | ORAL | 0 refills | Status: AC
Start: 2022-12-04 — End: ?

## 2022-12-04 NOTE — Progress Notes (Signed)
Established Patient Office Visit  Subjective   Patient ID: Erin Peck, female    DOB: 1966-03-11  Age: 57 y.o. MRN: [id number]  Chief Complaint  Patient presents with   Medical Management of Chronic Issues   Hyperlipidemia   Hypertension    HPI  Erin Peck is here with her daughter today for a chronic follow up.   Patient is not on antihypertensives. She has been on midodrine TID. She has been on this since her stroke in 2021. Initially this was started in the hospital when she was hypotensive with dizziness following stent placement for her stoke. Her blood pressures has consistently been elevated. She reports that her BP is always high and that she was told that it should always be elevated because of her stent. She reports that she was told to stay on midodrine to keep the stent open. She had 1 follow up visit with neurology after her stroke on 11/20/19. The instructions per neurology plan and assessment are as below:  Cryptogenic left MCA stroke:  -Initiate outpatient therapies once Medicaid approved.  Encouraged ongoing home exercises at this time with ongoing improvement -Advised daughter to call Dr. Fatima Sanger office to reschedule initial follow-up visit along with ongoing recommendation of aspirin and Brilinta post stent typically 74-month duration -Plan for ILR placement once Medicaid approved -Continue aspirin 325 mg daily and Brilinta (ticagrelor) 90 mg bid  and atorvastatin for secondary stroke prevention.  -Maintain strict control of hypertension with blood pressure goal below 130/90, diabetes with hemoglobin A1c goal below 6.5% and cholesterol with LDL cholesterol (bad cholesterol) goal below 70 mg/dL.  I also advised the patient to eat a healthy diet with plenty of whole grains, cereals, fruits and vegetables, exercise regularly with at least 30 minutes of continuous activity daily and maintain ideal body weight. Blood pressure: Initial hospital admission hypertensive  emergency and then hypotensive on second admission with need of midodrine 10 mg three times daily.  Discussion with daughter regarding hopeful use of as needed dosing three times daily if SBP<120 two provide 5 mg tablet and recheck in 1 hour.  If remains <120 provide additional 5 mg tablet. If SBP >120, recommend holding dosage and recheck in 2 to 3 hours and continue to hold if SBP >120.  Advised to continue this for three times daily dosing and continue to follow with PCP for further management, prescribing and monitoring HLD: Continuation of atorvastatin with ongoing follow-up with PCP for prescribing, monitoring and management  She reprots compliance with atorvastatin, aspirin, and brilinta. She has had regular imaging with interventional radiology, with last scan a few months ago. She does have limitations of her right side post stroke.     Past Medical History:  Diagnosis Date   Hyperlipidemia    Migraines    Stroke (cerebrum) (HCC) 10/02/2019   Stroke (cerebrum) (HCC) 10/09/2019    ROS As per HPI   Objective:     BP (!) 159/80   Pulse 71   Temp 97.9 F (36.6 C) (Temporal)   Ht 5\' 1"  (1.549 m)   Wt 187 lb (84.8 kg)   SpO2 97%   BMI 35.33 kg/m  BP Readings from Last 3 Encounters:  12/04/22 (!) 159/80  06/05/22 (!) 167/84  11/05/21 (!) 169/84      Physical Exam Vitals and nursing note reviewed.  Constitutional:      General: She is not in acute distress.    Appearance: She is not ill-appearing, toxic-appearing or diaphoretic.  HENT:  Head: Normocephalic and atraumatic.  Eyes:     Extraocular Movements: Extraocular movements intact.     Pupils: Pupils are equal, round, and reactive to light.  Pulmonary:     Effort: Pulmonary effort is normal. No respiratory distress.  Musculoskeletal:     Cervical back: Neck supple. No rigidity.     Right lower leg: No edema.     Left lower leg: No edema.  Skin:    General: Skin is warm and dry.  Neurological:     Mental  Status: She is alert and oriented to person, place, and time. Mental status is at baseline.     Motor: Weakness (right side) present.  Psychiatric:        Mood and Affect: Mood normal.        Behavior: Behavior normal.      No results found for any visits on 12/04/22.    The ASCVD Risk score (Arnett DK, et al., 2019) failed to calculate for the following reasons:   The patient has a prior MI or stroke diagnosis    Assessment & Plan:   Erin Peck was seen today for medical management of chronic issues, hyperlipidemia and hypertension.  Diagnoses and all orders for this visit:  Primary hypertension  Mixed hyperlipidemia  History of CVA with residual deficit -     atorvastatin (LIPITOR) 80 MG tablet; Take 1 tablet (80 mg total) by mouth daily at 6 PM. -     ticagrelor (BRILINTA) 90 MG TABS tablet; Take 1 tablet (90 mg total) by mouth 2 (two) times daily.   BP is uncontrolled. Patient has been continued on midodrine despite elevated BP readings. Discussed with patient the need to wean off of this for goal BP of <130/90 for secondary stroke prevention. She and her daughter are adamant that she needs to be continued on midodrine 10 mg TID to keep her blood pressure high because of the stents placed after her stoke. Discussed with patient that she was started on midodrine after her stroke when she was hypotensive. I printed a copy of the neurology visit note and went over the neurologist's assessment and plan that states strict BP goal of <130/90 as well as the PRN dosing for midodrine for SBP <120. Despite this education, patient continued to insist that refills of midodrine 10 mg TID be provided. I informed her that I would not be providing refills on midodrine and that I would discuss with my supervising PCP regarding the appropriateness of this and that our office would call her early next week regarding this. Instructed to continue statin and brilinta for secondary prevention. I will route  this chart to my supervising physican and will discuss with her in person when she returns to the office on Monday and will notify patient after discussion and determine follow up at that point.   Per supervising physician: BP is too elevated to continue current midodrine dosage. She recommend referral to both neurology and cardiology for further evaluation and management of midodrine as midodrine is not typically managed by family practice. Patient can continue current dosage if she chooses against medical advice with the understanding that elevated BP increase her risk for heart attack or stroke pending cardiology/neurology recommendations. Patient was notified of this by Billee Cashing and wishes to continue on midodrine at current dosage.  Gabriel Earing, FNP

## 2022-12-07 ENCOUNTER — Telehealth (HOSPITAL_COMMUNITY): Payer: Self-pay

## 2022-12-07 ENCOUNTER — Other Ambulatory Visit: Payer: Self-pay | Admitting: Family Medicine

## 2022-12-07 DIAGNOSIS — I6930 Unspecified sequelae of cerebral infarction: Secondary | ICD-10-CM

## 2022-12-07 DIAGNOSIS — I10 Essential (primary) hypertension: Secondary | ICD-10-CM

## 2022-12-07 DIAGNOSIS — I63512 Cerebral infarction due to unspecified occlusion or stenosis of left middle cerebral artery: Secondary | ICD-10-CM

## 2022-12-07 NOTE — Telephone Encounter (Signed)
Returned daughters call about medication refill, no answer, left vm. AB

## 2022-12-11 ENCOUNTER — Other Ambulatory Visit: Payer: Self-pay | Admitting: *Deleted

## 2022-12-11 MED ORDER — MIDODRINE HCL 10 MG PO TABS: 10.0000 mg | ORAL_TABLET | Freq: Three times a day (TID) | ORAL | 1 refills | Status: DC

## 2023-01-21 ENCOUNTER — Ambulatory Visit: Payer: Medicare (Managed Care) | Admitting: Family Medicine

## 2023-02-02 ENCOUNTER — Ambulatory Visit: Payer: Medicaid Other | Admitting: Cardiology

## 2023-03-04 ENCOUNTER — Institutional Professional Consult (permissible substitution): Payer: Medicare (Managed Care) | Admitting: Neurology

## 2023-03-30 ENCOUNTER — Ambulatory Visit: Payer: Medicare (Managed Care)

## 2023-04-28 ENCOUNTER — Ambulatory Visit: Payer: Medicare (Managed Care)

## 2023-05-04 ENCOUNTER — Other Ambulatory Visit: Payer: Self-pay | Admitting: Family Medicine

## 2023-09-16 ENCOUNTER — Other Ambulatory Visit (HOSPITAL_COMMUNITY): Payer: Self-pay | Admitting: Interventional Radiology

## 2023-09-16 DIAGNOSIS — I671 Cerebral aneurysm, nonruptured: Secondary | ICD-10-CM

## 2023-09-29 ENCOUNTER — Ambulatory Visit (HOSPITAL_COMMUNITY)
Admission: RE | Admit: 2023-09-29 | Discharge: 2023-09-29 | Disposition: A | Discharge status: Home / Self Care | Payer: Medicare (Managed Care) | Source: Ambulatory Visit | Attending: Interventional Radiology | Admitting: Interventional Radiology

## 2023-09-29 DIAGNOSIS — I671 Cerebral aneurysm, nonruptured: Secondary | ICD-10-CM

## 2023-10-21 ENCOUNTER — Telehealth (HOSPITAL_COMMUNITY): Payer: Self-pay

## 2023-10-21 NOTE — Telephone Encounter (Signed)
Pt agreed to f/u in 6 months with an mra. AB

## 2024-01-11 ENCOUNTER — Other Ambulatory Visit: Payer: Self-pay

## 2024-01-11 ENCOUNTER — Emergency Department (HOSPITAL_BASED_OUTPATIENT_CLINIC_OR_DEPARTMENT_OTHER)
Admission: EM | Admit: 2024-01-11 | Discharge: 2024-01-11 | Disposition: A | Discharge status: Home / Self Care | Payer: Medicare (Managed Care) | Attending: Emergency Medicine | Admitting: Emergency Medicine

## 2024-01-11 ENCOUNTER — Encounter (HOSPITAL_BASED_OUTPATIENT_CLINIC_OR_DEPARTMENT_OTHER): Payer: Self-pay

## 2024-01-11 DIAGNOSIS — L509 Urticaria, unspecified: Secondary | ICD-10-CM | POA: Insufficient documentation

## 2024-01-11 DIAGNOSIS — Z8673 Personal history of transient ischemic attack (TIA), and cerebral infarction without residual deficits: Secondary | ICD-10-CM | POA: Diagnosis not present

## 2024-01-11 DIAGNOSIS — T63441A Toxic effect of venom of bees, accidental (unintentional), initial encounter: Secondary | ICD-10-CM | POA: Diagnosis present

## 2024-01-11 DIAGNOSIS — Z955 Presence of coronary angioplasty implant and graft: Secondary | ICD-10-CM | POA: Diagnosis not present

## 2024-01-11 DIAGNOSIS — Z7982 Long term (current) use of aspirin: Secondary | ICD-10-CM | POA: Diagnosis not present

## 2024-01-11 MED ORDER — EPINEPHRINE 0.3 MG/0.3ML IJ SOAJ: 0.3000 mg | INTRAMUSCULAR | 0 refills | Status: AC | PRN

## 2024-01-11 MED ORDER — DEXAMETHASONE SODIUM PHOSPHATE 10 MG/ML IJ SOLN
10.0000 mg | Freq: Once | INTRAMUSCULAR | Status: AC
Start: 2024-01-11 — End: 2024-01-11
  Administered 2024-01-11: 10 mg via INTRAVENOUS
  Filled 2024-01-11: qty 1

## 2024-01-11 MED ORDER — DIPHENHYDRAMINE HCL 50 MG/ML IJ SOLN
50.0000 mg | Freq: Once | INTRAMUSCULAR | Status: AC
Start: 2024-01-11 — End: 2024-01-11
  Administered 2024-01-11: 50 mg via INTRAVENOUS
  Filled 2024-01-11: qty 1

## 2024-01-11 NOTE — ED Provider Notes (Signed)
 [place] EMERGENCY DEPARTMENT AT [facility] Provider Note   CSN: [id number] Arrival date & time: 01/11/24  1600     Patient presents with: Allergic Reaction and Hives   Erin Peck is a 58 y.o. female who presents emergency department with a chief complaint of allergic reaction to bee sting.  Patient states that she was stung by bee on her left hand.  Subsequently after this she started to develop a rash and hives over her abdomen and upper extremities.  Patient states that she does have some numbness and tingling surrounding her lips, denies throat swelling, tongue swelling, trouble breathing, trouble swallowing.  Denies previous history of reactions or anaphylactic reactions.  States she has been stung by a bee previously and did not have an allergic reaction.  Past medical history significant for acute ischemic stroke with stent placement on midodrine .     Allergic Reaction Presenting symptoms: rash (Fuhs hives present on both upper extremities, chest, abdomen, and back)   Presenting symptoms: no difficulty swallowing and no wheezing        Prior to Admission medications   Medication Sig Start Date End Date Taking? Authorizing Provider  EPINEPHrine 0.3 mg/0.3 mL IJ SOAJ injection Inject 0.3 mg into the muscle as needed for anaphylaxis. 01/11/24  Yes Weslie Pretlow F, PA-C  acetaminophen  (TYLENOL ) 325 MG tablet Take 2 tablets (650 mg total) by mouth every 4 (four) hours as needed for mild pain (or temp > 37.5 C (99.5 F)). 10/17/19   Angiulli, Everlyn Hockey, PA-C  aspirin  EC 325 MG tablet Take 325 mg by mouth daily.    [provider]  atorvastatin  (LIPITOR ) 80 MG tablet Take 1 tablet (80 mg total) by mouth daily at 6 PM. 12/04/22   Albertha Huger, FNP  midodrine  (PROAMATINE ) 10 MG tablet Take 1 tablet (10 mg total) by mouth 3 (three) times daily. 12/11/22   Albertha Huger, FNP  ticagrelor  (BRILINTA ) 90 MG TABS tablet Take 1 tablet (90 mg total) by mouth 2 (two)  times daily. 12/04/22   Albertha Huger, FNP    Allergies: Patient has no known allergies.    Review of Systems  Constitutional:  Negative for chills and fever.  HENT:  Negative for drooling, facial swelling, trouble swallowing and voice change.   Eyes:  Negative for visual disturbance.  Respiratory:  Negative for apnea, cough, choking, chest tightness, shortness of breath, wheezing and stridor.   Cardiovascular:  Negative for chest pain and palpitations.  Gastrointestinal:  Negative for nausea and vomiting.  Musculoskeletal:  Negative for gait problem.  Skin:  Positive for rash (Fuhs hives present on both upper extremities, chest, abdomen, and back). Negative for wound.       Swelling on left hand consistent with recent bee sting, no obvious open wound, no evidence of stinger  Neurological:  Positive for numbness (Numbness surrounding lips). Negative for dizziness, seizures, syncope, facial asymmetry, speech difficulty, weakness and light-headedness.    Updated Vital Signs BP 110/76   Pulse 93   Temp 97.8 F (36.6 C)   Resp (!) 22   Ht 5' 1 (1.549 m)   Wt 85 kg   SpO2 98%   BMI 35.41 kg/m   Physical Exam Vitals and nursing note reviewed.  Constitutional:      General: She is awake. She is not in acute distress.    Appearance: Normal appearance. She is not ill-appearing, toxic-appearing or diaphoretic.  HENT:     Head: Normocephalic and  atraumatic.   Eyes:     Extraocular Movements: Extraocular movements intact.    Cardiovascular:     Rate and Rhythm: Normal rate and regular rhythm.  Pulmonary:     Effort: Pulmonary effort is normal. No respiratory distress.     Breath sounds: Normal breath sounds. No stridor. No wheezing, rhonchi or rales.   Musculoskeletal:        General: Normal range of motion.   Skin:    General: Skin is warm and dry.     Capillary Refill: Capillary refill takes less than 2 seconds.     Findings: Rash (Diffuse rash consistent with hives  present on abdomen, chest, both upper extremities, as well as back) present.   Neurological:     General: No focal deficit present.     Mental Status: She is alert and oriented to person, place, and time.     Cranial Nerves: No cranial nerve deficit.     Sensory: No sensory deficit.     Motor: No weakness.     Coordination: Coordination normal.     Gait: Gait normal.   Psychiatric:        Mood and Affect: Mood normal.        Behavior: Behavior normal. Behavior is cooperative.       (all labs ordered are listed, but only abnormal results are displayed) Labs Reviewed - No data to display  EKG: None  Radiology: No results found.   Procedures   Medications Ordered in the ED  diphenhydrAMINE  (BENADRYL ) injection 50 mg (50 mg Intravenous Given 01/11/24 1629)  dexamethasone  (DECADRON ) injection 10 mg (10 mg Intravenous Given 01/11/24 1632)                                    Medical Decision Making Risk Prescription drug management.   Patient presents to the ED for concern of allergic reaction to bee sting, this involves an extensive number of treatment options, and is a complaint that carries with it a high risk of complications and morbidity.  The differential diagnosis includes anaphylaxis, hives, swelling, etc.   Co morbidities that complicate the patient evaluation  None   Cardiac Monitoring:  The patient was maintained on a cardiac monitor.  I personally viewed and interpreted the cardiac monitored which showed an underlying rhythm of: sinus   Medicines ordered and prescription drug management:  I ordered medication including Benadryl  and dexamethasone  for allergic reaction Reevaluation of the patient after these medicines showed that the patient improved I have reviewed the patients home medicines and have made adjustments as needed   Test Considered:  none   Critical Interventions:  None  Problem List / ED Course:  58 year old female patient  presents to the emergency department after being stung by a bee on her left hand.  In the time following she developed a diffuse rash on her upper extremities as well as abdomen.  Patient denies shortness of breath, throat swelling, tongue swelling, issues swallowing.  Patient states that she has never had an anaphylactic reaction before.  Does not have an EpiPen outpatient. Time of initial exam, patient has no concerning respiratory symptoms, no throat swelling, no shortness of breath, no tongue swelling.  She does however state that she has some numbness and tingling surrounding her lips.  Diffuse rash present on trunk and upper extremities Benadryl  as well as dexamethasone  given On reassessment patient rash has overall  improved, no longer present on trunk, only present on left upper extremity closest to where bee sting occurred Patient prescribed EpiPen outpatient Return precautions given Patient discharged Patient instructed to make physician managing her stent from previous stroke aware of today's events and that EpiPen has been prescribed.   Reevaluation:  After the interventions noted above, I reevaluated the patient and found that they have :improved   Social Determinants of Health:  None   Dispostion:  After consideration of the diagnostic results and the patients response to treatment, I feel that the patent would benefit from discharge and outpatient follow-up with primary care provider and specialist as scheduled.  Return precautions given.  Patient discharged.     Final diagnoses:  Allergic reaction to bee sting  Hives    ED Discharge Orders          Ordered    EPINEPHrine 0.3 mg/0.3 mL IJ SOAJ injection  As needed        01/11/24 1819               Braxdon Gappa F, PA-C 01/12/24 0103    Tonya Fredrickson, MD 01/12/24 1051

## 2024-01-11 NOTE — ED Triage Notes (Signed)
 Arrives POV with complaints of being stung by a bee around 2 hours priors to arrival. Patient started to develop hives/rash 30 minutes after being stung. Some tingling around lips as well.  Patient took an allergy tablet prior to arrival.  No previous reactions like this before, per patient.

## 2024-01-11 NOTE — Discharge Instructions (Addendum)
 It was a pleasure taking care of you today.  Today we evaluated you after your allergic reaction to the bee sting on your left hand.  Due to your allergic reaction and the development of hives, you were given Benadryl  as well as a steroid today.  At time of discharge your rash and hives has almost completely subsided, and is now only present on your left forearm, please continue to monitor this at home and ensure that it continues to decrease.  You have been prescribed an EpiPen, please pick up this EpiPen and follow-up with your primary care provider as well as specialists.  This EpiPen is only for severe allergic reactions.  Please return to the emergency department or seek further medical care if you experience any of the following symptoms including but not limited to chest pain, shortness of breath, throat tightness, tongue swelling, worsening rash, or other allergic reaction symptoms.  I do recommend making the specialist following your stroke aware of what happened today and that you now have been prescribed an epipen.

## 2024-01-12 ENCOUNTER — Telehealth (HOSPITAL_COMMUNITY): Payer: Self-pay

## 2024-01-12 NOTE — Telephone Encounter (Signed)
 [name] called with concerns about using her Epipen for if she gets stung by a bee. Her PCP wanted her to check with Dr. Alvira Josephs bc of her stroke hx. Discussed with our Neuro PA today Aimee and it is fine for pt to use. Message relayed to daughter. AB

## 2024-01-21 ENCOUNTER — Encounter (HOSPITAL_COMMUNITY): Payer: Self-pay | Admitting: Interventional Radiology

## 2024-03-30 ENCOUNTER — Ambulatory Visit: Payer: Medicare (Managed Care) | Admitting: Internal Medicine

## 2024-05-30 ENCOUNTER — Ambulatory Visit: Payer: Medicare (Managed Care) | Admitting: Internal Medicine

## 2024-06-29 ENCOUNTER — Ambulatory Visit: Payer: Medicare (Managed Care) | Attending: Cardiology | Admitting: Cardiology

## 2024-06-29 ENCOUNTER — Encounter: Payer: Self-pay | Admitting: Cardiology

## 2024-06-29 VITALS — BP 134/84 | HR 80 | Ht 60.0 in | Wt 196.0 lb

## 2024-06-29 DIAGNOSIS — Z8673 Personal history of transient ischemic attack (TIA), and cerebral infarction without residual deficits: Secondary | ICD-10-CM | POA: Diagnosis not present

## 2024-06-29 DIAGNOSIS — E782 Mixed hyperlipidemia: Secondary | ICD-10-CM | POA: Diagnosis not present

## 2024-06-29 DIAGNOSIS — I959 Hypotension, unspecified: Secondary | ICD-10-CM

## 2024-06-29 NOTE — Progress Notes (Signed)
 Clinical Summary Erin Peck is a 58 y.o.female last seen by PA Tillery in 2021, seen today as a new patient for the following medical problems.  1.Hypotension - was previously on midodrine  per note during 2021 admission.  - from neuro note long term bp goal 120-150 given cerebral stent - midodrine  2.5mg  if SBP <120. Rarely needs midodrine . SBP typically 120s-130s.     2.Cryptogenic stroke - 10/02/2019 which stroke work-up revealing patchy left MCA infarct due to left M1 occlusion s/p TPA and IR with TICI 2C revascularization with left MCA stent, infarct embolic secondary to unknown source   - maging demonstrated patchy L MCA infarcts due to left M1 occlusion s/p tpa and revascularization with L MCA stent. TTE and TEE showed normal EF and no source of thrombus. Pt consented for ILR but no insurance at that time.  - in 2021 was awaiting insurance coverage to get loop recorder  -on ASA/brillinta. From her report was recommended to continue lifelong DAPT.  - not interested loop recorder    3. HLD - Jan 2025 TC 146 TG 98 HDL 56 LDL 70   Past Medical History:  Diagnosis Date   Hyperlipidemia    Migraines    Stroke (cerebrum) (HCC) 10/02/2019   Stroke (cerebrum) (HCC) 10/09/2019     No Known Allergies   Current Outpatient Medications  Medication Sig Dispense Refill   acetaminophen  (TYLENOL ) 325 MG tablet Take 2 tablets (650 mg total) by mouth every 4 (four) hours as needed for mild pain (or temp > 37.5 C (99.5 F)).     aspirin  EC 325 MG tablet Take 325 mg by mouth daily.     atorvastatin  (LIPITOR ) 80 MG tablet Take 1 tablet (80 mg total) by mouth daily at 6 PM. 90 tablet 0   EPINEPHrine  0.3 mg/0.3 mL IJ SOAJ injection Inject 0.3 mg into the muscle as needed for anaphylaxis. 1 each 0   midodrine  (PROAMATINE ) 10 MG tablet Take 1 tablet (10 mg total) by mouth 3 (three) times daily. 270 tablet 1   ticagrelor  (BRILINTA ) 90 MG TABS tablet Take 1 tablet (90 mg total) by mouth 2  (two) times daily. 180 tablet 0   No current facility-administered medications for this visit.     Past Surgical History:  Procedure Laterality Date   BUBBLE STUDY  10/06/2019   Procedure: BUBBLE STUDY;  Surgeon: Shlomo Wilbert SAUNDERS, MD;  Location: [facility] ENDOSCOPY;  Service: Cardiovascular;;   IR CT HEAD LTD  10/03/2019   IR INTRA CRAN STENT  10/03/2019   IR PATIENT EVAL TECH 0-60 MINS  10/03/2019   IR PERCUTANEOUS ART THROMBECTOMY/INFUSION INTRACRANIAL INC DIAG ANGIO  10/03/2019   RADIOLOGY WITH ANESTHESIA N/A 10/02/2019   Procedure: IR WITH ANESTHESIA;  Surgeon: Dolphus Carrion, MD;  Location: [facility] OR;  Service: Radiology;  Laterality: N/A;   TEE WITHOUT CARDIOVERSION N/A 10/06/2019   Procedure: TRANSESOPHAGEAL ECHOCARDIOGRAM (TEE);  Surgeon: Shlomo Wilbert SAUNDERS, MD;  Location: [facility] ENDOSCOPY;  Service: Cardiovascular;  Laterality: N/A;     No Known Allergies    Family History  Problem Relation Age of Onset   Heart disease Mother    Diabetes Mother    Heart disease Father    Diabetes Father      Social History [name] reports that she has quit smoking. She has never used smokeless tobacco. [name] reports that she does not currently use alcohol.    Physical Examination Today's Vitals   06/29/24 1017  BP:  134/84  Pulse: 80  SpO2: 97%  Weight: 196 lb (88.9 kg)  Height: 5' (1.524 m)   Body mass index is 38.28 kg/m.  Gen: resting comfortably, no acute distress HEENT: no scleral icterus, pupils equal round and reactive, no palptable cervical adenopathy,  CV: RRR, no m/rg, no jvd Resp: Clear to auscultation bilaterally GI: abdomen is soft, non-tender, non-distended, normal bowel sounds, no hepatosplenomegaly MSK: extremities are warm, no edema.  Skin: warm, no rash Neuro:  no focal deficits Psych: appropriate affect   Diagnostic Studies  09/2019 TEE 1. Left ventricular ejection fraction, by estimation, is 60 to 65%. The  left ventricle has normal function. The left  ventricle has no regional  wall motion abnormalities.   2. Right ventricular systolic function is normal. The right ventricular  size is normal.   3. No left atrial/left atrial appendage thrombus was detected.   4. The mitral valve is normal in structure. Trivial mitral valve  regurgitation.   5. The aortic valve is normal in structure. Aortic valve regurgitation is  not visualized. 6. Trivial circumferential pericardial effusion.   09/2019 echo 1. Left ventricular ejection fraction, by estimation, is 60 to 65%. The  left ventricle has normal function. The left ventricle has no regional  wall motion abnormalities. Left ventricular diastolic parameters were  normal.   2. Right ventricular systolic function is normal. The right ventricular  size is normal.   3. The mitral valve is normal in structure. No evidence of mitral valve  regurgitation. No evidence of mitral stenosis.   4. The aortic valve is normal in structure. Aortic valve regurgitation is  not visualized. No aortic stenosis is present.   5. The inferior vena cava is normal in size with greater than 50%  respiratory variability, suggesting right atrial pressure of 3 mmHg.    Assessment and Plan   1.Hypotension - low bp's during prior admission at time of CVA, was started on midodrine  at the time. From neuro notes plans for SBP 120-150 given her cerebral stent - bp's normal today, only takes the 2.5mg  of midodrine  prn now for SBP <120 which she rarely needs. Reasonable to continue midodrine  as currently prescribed  2. HLD - at goal, continue current meds  3. CVA/prior stent - check with IR who has taken over for Dr Dolphus. She had been getting annual MRAs. - defer DAPT to neuro, not on for cardiac etiology - did not have insurance at time of CVA in 2021 and thus did not get ILR, at this time this far out would not pursue and she also declines interest in having done.    EKG today shows NSR   F/u 1  year    Dorn PHEBE Ross, M.D.

## 2024-06-29 NOTE — Patient Instructions (Signed)
